# Patient Record
Sex: Male | Born: 1940 | Race: White | Hispanic: No | Marital: Married | Smoking: Never smoker
Health system: Southern US, Community
[De-identification: ages and names within clinical notes are randomized; demographics above are authoritative.]

## PROBLEM LIST (undated history)

## (undated) DIAGNOSIS — I447 Left bundle-branch block, unspecified: Secondary | ICD-10-CM

## (undated) DIAGNOSIS — I509 Heart failure, unspecified: Secondary | ICD-10-CM

## (undated) DIAGNOSIS — I219 Acute myocardial infarction, unspecified: Secondary | ICD-10-CM

## (undated) DIAGNOSIS — Z95810 Presence of automatic (implantable) cardiac defibrillator: Secondary | ICD-10-CM

## (undated) DIAGNOSIS — I472 Ventricular tachycardia, unspecified: Secondary | ICD-10-CM

## (undated) DIAGNOSIS — I2510 Atherosclerotic heart disease of native coronary artery without angina pectoris: Secondary | ICD-10-CM

## (undated) DIAGNOSIS — I259 Chronic ischemic heart disease, unspecified: Secondary | ICD-10-CM

## (undated) DIAGNOSIS — I209 Angina pectoris, unspecified: Secondary | ICD-10-CM

## (undated) DIAGNOSIS — E785 Hyperlipidemia, unspecified: Secondary | ICD-10-CM

## (undated) DIAGNOSIS — I10 Essential (primary) hypertension: Secondary | ICD-10-CM

## (undated) DIAGNOSIS — R0600 Dyspnea, unspecified: Secondary | ICD-10-CM

## (undated) DIAGNOSIS — E119 Type 2 diabetes mellitus without complications: Secondary | ICD-10-CM

## (undated) DIAGNOSIS — Z950 Presence of cardiac pacemaker: Secondary | ICD-10-CM

## (undated) DIAGNOSIS — I5043 Acute on chronic combined systolic (congestive) and diastolic (congestive) heart failure: Secondary | ICD-10-CM

## (undated) HISTORY — DX: Heart failure, unspecified: I50.9

## (undated) HISTORY — DX: Type 2 diabetes mellitus without complications: E11.9

## (undated) HISTORY — DX: Acute myocardial infarction, unspecified: I21.9

## (undated) HISTORY — DX: Hyperlipidemia, unspecified: E78.5

## (undated) HISTORY — PX: VASECTOMY: SHX75

## (undated) HISTORY — PX: CARDIAC CATHETERIZATION: SHX172

## (undated) HISTORY — PX: A-V CARDIAC PACEMAKER INSERTION: SHX562

## (undated) HISTORY — DX: Ventricular tachycardia, unspecified: I47.20

## (undated) HISTORY — DX: Left bundle-branch block, unspecified: I44.7

## (undated) HISTORY — DX: Ventricular tachycardia: I47.2

## (undated) HISTORY — DX: Chronic ischemic heart disease, unspecified: I25.9

## (undated) HISTORY — PX: URACHAL CYST EXCISION: SUR579

---
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
## 1998-12-08 HISTORY — PX: CORONARY ARTERY BYPASS GRAFT: SHX141

## 1999-09-07 ENCOUNTER — Inpatient Hospital Stay (HOSPITAL_COMMUNITY): Admission: EM | Admit: 1999-09-07 | Discharge: 1999-09-23 | Payer: Self-pay | Admitting: Emergency Medicine

## 1999-09-08 ENCOUNTER — Encounter: Payer: Self-pay | Admitting: Cardiothoracic Surgery

## 1999-09-09 ENCOUNTER — Encounter: Payer: Self-pay | Admitting: Cardiothoracic Surgery

## 1999-09-10 ENCOUNTER — Encounter: Payer: Self-pay | Admitting: Cardiothoracic Surgery

## 1999-09-11 ENCOUNTER — Encounter: Payer: Self-pay | Admitting: Cardiothoracic Surgery

## 1999-09-12 ENCOUNTER — Encounter: Payer: Self-pay | Admitting: Cardiothoracic Surgery

## 1999-09-13 ENCOUNTER — Encounter: Payer: Self-pay | Admitting: Cardiothoracic Surgery

## 1999-09-16 ENCOUNTER — Encounter: Payer: Self-pay | Admitting: Cardiothoracic Surgery

## 1999-09-17 ENCOUNTER — Encounter: Payer: Self-pay | Admitting: Cardiothoracic Surgery

## 1999-09-20 ENCOUNTER — Encounter: Payer: Self-pay | Admitting: Cardiothoracic Surgery

## 1999-09-22 ENCOUNTER — Encounter: Payer: Self-pay | Admitting: Thoracic Surgery (Cardiothoracic Vascular Surgery)

## 1999-10-08 ENCOUNTER — Encounter (HOSPITAL_COMMUNITY): Admission: RE | Admit: 1999-10-08 | Discharge: 2000-01-06 | Payer: Self-pay | Admitting: Cardiology

## 2000-06-02 ENCOUNTER — Inpatient Hospital Stay (HOSPITAL_COMMUNITY): Admission: EM | Admit: 2000-06-02 | Discharge: 2000-06-06 | Payer: Self-pay | Admitting: Emergency Medicine

## 2000-06-02 ENCOUNTER — Encounter: Payer: Self-pay | Admitting: Emergency Medicine

## 2000-08-11 ENCOUNTER — Encounter (HOSPITAL_COMMUNITY): Admission: RE | Admit: 2000-08-11 | Discharge: 2000-11-09 | Payer: Self-pay | Admitting: Cardiology

## 2000-09-07 ENCOUNTER — Inpatient Hospital Stay (HOSPITAL_COMMUNITY): Admission: EM | Admit: 2000-09-07 | Discharge: 2000-09-09 | Payer: Self-pay | Admitting: Emergency Medicine

## 2001-04-14 ENCOUNTER — Encounter: Payer: Self-pay | Admitting: Cardiology

## 2001-04-15 ENCOUNTER — Inpatient Hospital Stay (HOSPITAL_COMMUNITY): Admission: EM | Admit: 2001-04-15 | Discharge: 2001-04-20 | Payer: Self-pay | Admitting: Emergency Medicine

## 2001-04-20 ENCOUNTER — Encounter: Payer: Self-pay | Admitting: Internal Medicine

## 2003-11-17 ENCOUNTER — Ambulatory Visit: Admission: RE | Admit: 2003-11-17 | Discharge: 2003-11-17 | Payer: Self-pay | Admitting: Cardiology

## 2004-02-16 ENCOUNTER — Ambulatory Visit: Admission: RE | Admit: 2004-02-16 | Discharge: 2004-02-16 | Payer: Self-pay | Admitting: Pulmonary Disease

## 2004-05-13 ENCOUNTER — Encounter: Admission: RE | Admit: 2004-05-13 | Discharge: 2004-05-13 | Payer: Self-pay | Admitting: Internal Medicine

## 2004-08-09 ENCOUNTER — Ambulatory Visit (HOSPITAL_COMMUNITY): Admission: RE | Admit: 2004-08-09 | Discharge: 2004-08-09 | Payer: Self-pay | Admitting: Cardiology

## 2004-08-14 ENCOUNTER — Inpatient Hospital Stay (HOSPITAL_COMMUNITY): Admission: RE | Admit: 2004-08-14 | Discharge: 2004-08-14 | Payer: Self-pay | Admitting: Internal Medicine

## 2004-09-09 ENCOUNTER — Ambulatory Visit: Payer: Self-pay | Admitting: Cardiology

## 2004-10-22 ENCOUNTER — Ambulatory Visit: Payer: Self-pay | Admitting: Internal Medicine

## 2004-11-04 ENCOUNTER — Ambulatory Visit: Payer: Self-pay | Admitting: Internal Medicine

## 2004-11-20 ENCOUNTER — Ambulatory Visit: Payer: Self-pay

## 2004-11-20 ENCOUNTER — Ambulatory Visit: Payer: Self-pay | Admitting: Internal Medicine

## 2004-11-22 ENCOUNTER — Ambulatory Visit: Payer: Self-pay | Admitting: Critical Care Medicine

## 2004-11-22 ENCOUNTER — Ambulatory Visit: Admission: RE | Admit: 2004-11-22 | Discharge: 2004-11-22 | Payer: Self-pay | Admitting: Cardiology

## 2005-02-10 ENCOUNTER — Ambulatory Visit: Payer: Self-pay | Admitting: Internal Medicine

## 2005-02-14 ENCOUNTER — Ambulatory Visit: Payer: Self-pay | Admitting: Internal Medicine

## 2005-02-20 ENCOUNTER — Ambulatory Visit: Payer: Self-pay

## 2005-05-02 ENCOUNTER — Ambulatory Visit: Payer: Self-pay | Admitting: Cardiology

## 2005-05-09 ENCOUNTER — Ambulatory Visit: Payer: Self-pay | Admitting: Cardiology

## 2005-05-12 ENCOUNTER — Ambulatory Visit: Payer: Self-pay | Admitting: Cardiovascular Disease

## 2005-05-12 ENCOUNTER — Inpatient Hospital Stay (HOSPITAL_BASED_OUTPATIENT_CLINIC_OR_DEPARTMENT_OTHER): Admission: RE | Admit: 2005-05-12 | Discharge: 2005-05-12 | Payer: Self-pay | Admitting: Cardiovascular Disease

## 2005-05-29 ENCOUNTER — Ambulatory Visit: Payer: Self-pay | Admitting: Cardiology

## 2005-08-01 ENCOUNTER — Ambulatory Visit: Payer: Self-pay | Admitting: Internal Medicine

## 2005-09-12 ENCOUNTER — Ambulatory Visit: Payer: Self-pay | Admitting: Internal Medicine

## 2005-11-07 ENCOUNTER — Ambulatory Visit: Payer: Self-pay | Admitting: Cardiology

## 2005-11-17 ENCOUNTER — Ambulatory Visit: Payer: Self-pay | Admitting: Pulmonary Disease

## 2005-11-18 ENCOUNTER — Ambulatory Visit: Admission: RE | Admit: 2005-11-18 | Discharge: 2005-11-18 | Payer: Self-pay | Admitting: Pulmonary Disease

## 2005-12-17 ENCOUNTER — Inpatient Hospital Stay (HOSPITAL_COMMUNITY): Admission: EM | Admit: 2005-12-17 | Discharge: 2005-12-18 | Payer: Self-pay | Admitting: Emergency Medicine

## 2005-12-17 ENCOUNTER — Ambulatory Visit: Payer: Self-pay | Admitting: Cardiovascular Disease

## 2005-12-22 ENCOUNTER — Ambulatory Visit: Payer: Self-pay | Admitting: Cardiology

## 2006-01-02 ENCOUNTER — Ambulatory Visit: Payer: Self-pay | Admitting: Cardiology

## 2006-01-06 ENCOUNTER — Ambulatory Visit: Payer: Self-pay | Admitting: Cardiology

## 2006-01-14 ENCOUNTER — Ambulatory Visit: Payer: Self-pay | Admitting: Cardiology

## 2006-01-22 ENCOUNTER — Ambulatory Visit: Payer: Self-pay | Admitting: Cardiology

## 2006-02-05 ENCOUNTER — Ambulatory Visit: Payer: Self-pay | Admitting: Cardiology

## 2006-02-26 ENCOUNTER — Ambulatory Visit: Payer: Self-pay | Admitting: Cardiology

## 2006-03-17 ENCOUNTER — Ambulatory Visit: Payer: Self-pay | Admitting: Cardiology

## 2006-04-08 ENCOUNTER — Ambulatory Visit: Payer: Self-pay | Admitting: Cardiology

## 2006-04-13 ENCOUNTER — Ambulatory Visit: Payer: Self-pay | Admitting: Internal Medicine

## 2006-04-14 ENCOUNTER — Ambulatory Visit: Payer: Self-pay | Admitting: Cardiology

## 2006-04-15 ENCOUNTER — Ambulatory Visit: Payer: Self-pay | Admitting: Internal Medicine

## 2006-04-30 ENCOUNTER — Ambulatory Visit: Payer: Self-pay | Admitting: Internal Medicine

## 2006-04-30 ENCOUNTER — Ambulatory Visit: Payer: Self-pay

## 2006-05-12 ENCOUNTER — Ambulatory Visit: Payer: Self-pay | Admitting: Internal Medicine

## 2006-05-13 ENCOUNTER — Ambulatory Visit: Payer: Self-pay | Admitting: Cardiology

## 2006-06-04 ENCOUNTER — Ambulatory Visit: Payer: Self-pay | Admitting: Cardiology

## 2006-07-06 ENCOUNTER — Ambulatory Visit: Payer: Self-pay | Admitting: Cardiology

## 2006-07-21 ENCOUNTER — Ambulatory Visit: Payer: Self-pay | Admitting: Cardiology

## 2006-08-06 ENCOUNTER — Ambulatory Visit: Payer: Self-pay | Admitting: Internal Medicine

## 2006-08-18 ENCOUNTER — Ambulatory Visit: Payer: Self-pay | Admitting: Cardiology

## 2006-08-19 ENCOUNTER — Ambulatory Visit: Payer: Self-pay | Admitting: Internal Medicine

## 2006-09-15 ENCOUNTER — Ambulatory Visit: Payer: Self-pay | Admitting: Cardiology

## 2006-10-13 ENCOUNTER — Ambulatory Visit: Payer: Self-pay | Admitting: Cardiology

## 2006-10-21 ENCOUNTER — Ambulatory Visit: Payer: Self-pay | Admitting: Cardiology

## 2006-11-10 ENCOUNTER — Ambulatory Visit: Payer: Self-pay | Admitting: Internal Medicine

## 2006-11-24 ENCOUNTER — Ambulatory Visit: Payer: Self-pay | Admitting: Cardiology

## 2006-12-04 ENCOUNTER — Ambulatory Visit: Payer: Self-pay | Admitting: Cardiology

## 2006-12-31 ENCOUNTER — Ambulatory Visit: Payer: Self-pay | Admitting: Internal Medicine

## 2007-01-01 ENCOUNTER — Ambulatory Visit: Payer: Self-pay | Admitting: Internal Medicine

## 2007-01-29 ENCOUNTER — Ambulatory Visit: Payer: Self-pay | Admitting: Internal Medicine

## 2007-02-25 ENCOUNTER — Ambulatory Visit: Payer: Self-pay | Admitting: Cardiology

## 2007-03-03 ENCOUNTER — Ambulatory Visit: Payer: Self-pay | Admitting: Internal Medicine

## 2007-03-03 LAB — CONVERTED CEMR LAB
ALT: 35 units/L (ref 0–40)
AST: 29 units/L (ref 0–37)
Cholesterol: 126 mg/dL (ref 0–200)
Total Bilirubin: 0.9 mg/dL (ref 0.3–1.2)
Total Protein: 6.7 g/dL (ref 6.0–8.3)
VLDL: 55 mg/dL — ABNORMAL HIGH (ref 0–40)

## 2007-03-11 ENCOUNTER — Ambulatory Visit: Payer: Self-pay | Admitting: Internal Medicine

## 2007-03-11 LAB — CONVERTED CEMR LAB
Creatinine, Ser: 1.2 mg/dL (ref 0.4–1.5)
Eosinophils Absolute: 0.3 10*3/uL (ref 0.0–0.6)
HCT: 42.6 % (ref 39.0–52.0)
Hemoglobin: 14.9 g/dL (ref 13.0–17.0)
Hgb A1c MFr Bld: 6.4 % — ABNORMAL HIGH (ref 4.6–6.0)
Lymphocytes Relative: 22.2 % (ref 12.0–46.0)
MCV: 86.8 fL (ref 78.0–100.0)
Monocytes Relative: 7.7 % (ref 3.0–11.0)
Neutro Abs: 6.8 10*3/uL (ref 1.4–7.7)
Platelets: 206 10*3/uL (ref 150–400)
RBC: 4.91 M/uL (ref 4.22–5.81)
RDW: 12.7 % (ref 11.5–14.6)
WBC: 10.3 10*3/uL (ref 4.5–10.5)

## 2007-03-26 DIAGNOSIS — M109 Gout, unspecified: Secondary | ICD-10-CM

## 2007-03-26 DIAGNOSIS — E8881 Metabolic syndrome: Secondary | ICD-10-CM

## 2007-04-01 ENCOUNTER — Ambulatory Visit: Payer: Self-pay | Admitting: Internal Medicine

## 2007-04-02 ENCOUNTER — Ambulatory Visit: Payer: Self-pay | Admitting: Cardiology

## 2007-04-30 ENCOUNTER — Ambulatory Visit: Payer: Self-pay | Admitting: Cardiovascular Disease

## 2007-05-28 ENCOUNTER — Ambulatory Visit: Payer: Self-pay | Admitting: Cardiology

## 2007-06-05 ENCOUNTER — Encounter: Payer: Self-pay | Admitting: Internal Medicine

## 2007-06-25 ENCOUNTER — Ambulatory Visit: Payer: Self-pay | Admitting: Cardiovascular Disease

## 2007-07-21 ENCOUNTER — Ambulatory Visit: Payer: Self-pay | Admitting: Cardiology

## 2007-08-06 ENCOUNTER — Encounter: Payer: Self-pay | Admitting: Internal Medicine

## 2007-08-18 ENCOUNTER — Ambulatory Visit: Payer: Self-pay | Admitting: Cardiology

## 2007-08-25 ENCOUNTER — Ambulatory Visit: Payer: Self-pay | Admitting: Internal Medicine

## 2007-08-26 ENCOUNTER — Ambulatory Visit: Payer: Self-pay | Admitting: Internal Medicine

## 2007-09-07 ENCOUNTER — Ambulatory Visit: Payer: Self-pay | Admitting: Cardiology

## 2007-09-20 ENCOUNTER — Observation Stay (HOSPITAL_COMMUNITY): Admission: EM | Admit: 2007-09-20 | Discharge: 2007-09-21 | Payer: Self-pay | Admitting: Emergency Medicine

## 2007-09-20 ENCOUNTER — Ambulatory Visit: Payer: Self-pay | Admitting: Cardiology

## 2007-09-22 ENCOUNTER — Encounter: Payer: Self-pay | Admitting: Internal Medicine

## 2007-09-22 ENCOUNTER — Ambulatory Visit: Payer: Self-pay

## 2007-10-01 ENCOUNTER — Telehealth (INDEPENDENT_AMBULATORY_CARE_PROVIDER_SITE_OTHER): Payer: Self-pay | Admitting: *Deleted

## 2007-10-04 ENCOUNTER — Telehealth (INDEPENDENT_AMBULATORY_CARE_PROVIDER_SITE_OTHER): Payer: Self-pay | Admitting: *Deleted

## 2007-10-05 ENCOUNTER — Ambulatory Visit: Payer: Self-pay | Admitting: Cardiology

## 2007-10-18 ENCOUNTER — Ambulatory Visit: Payer: Self-pay | Admitting: Internal Medicine

## 2007-10-18 DIAGNOSIS — E119 Type 2 diabetes mellitus without complications: Secondary | ICD-10-CM

## 2007-10-18 DIAGNOSIS — I10 Essential (primary) hypertension: Secondary | ICD-10-CM | POA: Insufficient documentation

## 2007-10-18 LAB — CONVERTED CEMR LAB: HDL goal, serum: 40 mg/dL

## 2007-10-19 ENCOUNTER — Ambulatory Visit: Payer: Self-pay | Admitting: Cardiovascular Disease

## 2007-10-19 ENCOUNTER — Ambulatory Visit: Payer: Self-pay | Admitting: Cardiology

## 2007-10-27 ENCOUNTER — Encounter (INDEPENDENT_AMBULATORY_CARE_PROVIDER_SITE_OTHER): Payer: Self-pay | Admitting: *Deleted

## 2007-10-27 LAB — CONVERTED CEMR LAB
Microalb Creat Ratio: 11.9 mg/g (ref 0.0–30.0)
Uric Acid, Serum: 9.9 mg/dL — ABNORMAL HIGH (ref 2.4–7.0)

## 2007-11-09 ENCOUNTER — Ambulatory Visit: Payer: Self-pay | Admitting: Cardiology

## 2007-11-25 ENCOUNTER — Ambulatory Visit: Payer: Self-pay | Admitting: Internal Medicine

## 2007-11-30 ENCOUNTER — Ambulatory Visit: Payer: Self-pay | Admitting: Cardiology

## 2007-12-28 ENCOUNTER — Ambulatory Visit: Payer: Self-pay | Admitting: Cardiology

## 2008-01-04 ENCOUNTER — Encounter: Payer: Self-pay | Admitting: Internal Medicine

## 2008-01-25 ENCOUNTER — Ambulatory Visit: Payer: Self-pay | Admitting: Cardiovascular Disease

## 2008-02-11 ENCOUNTER — Ambulatory Visit: Payer: Self-pay | Admitting: Internal Medicine

## 2008-02-11 DIAGNOSIS — I951 Orthostatic hypotension: Secondary | ICD-10-CM | POA: Insufficient documentation

## 2008-02-11 DIAGNOSIS — E782 Mixed hyperlipidemia: Secondary | ICD-10-CM | POA: Insufficient documentation

## 2008-02-22 ENCOUNTER — Ambulatory Visit: Payer: Self-pay | Admitting: Cardiology

## 2008-02-24 ENCOUNTER — Ambulatory Visit: Payer: Self-pay | Admitting: Internal Medicine

## 2008-03-01 ENCOUNTER — Telehealth (INDEPENDENT_AMBULATORY_CARE_PROVIDER_SITE_OTHER): Payer: Self-pay | Admitting: *Deleted

## 2008-03-02 ENCOUNTER — Ambulatory Visit: Payer: Self-pay | Admitting: Internal Medicine

## 2008-03-02 LAB — CONVERTED CEMR LAB: INR: 1.6

## 2008-03-03 ENCOUNTER — Ambulatory Visit: Payer: Self-pay | Admitting: Internal Medicine

## 2008-03-06 ENCOUNTER — Encounter (INDEPENDENT_AMBULATORY_CARE_PROVIDER_SITE_OTHER): Payer: Self-pay | Admitting: *Deleted

## 2008-03-06 ENCOUNTER — Ambulatory Visit: Payer: Self-pay | Admitting: Cardiology

## 2008-03-22 ENCOUNTER — Ambulatory Visit: Payer: Self-pay | Admitting: Internal Medicine

## 2008-04-05 ENCOUNTER — Ambulatory Visit: Payer: Self-pay | Admitting: Cardiovascular Disease

## 2008-04-19 ENCOUNTER — Ambulatory Visit: Payer: Self-pay | Admitting: Internal Medicine

## 2008-04-19 LAB — CONVERTED CEMR LAB
Direct LDL: 61.6 mg/dL
HDL: 25.9 mg/dL — ABNORMAL LOW (ref >39.0)
Total CHOL/HDL Ratio: 4.9
Triglycerides: 285 mg/dL (ref 0–149)
VLDL: 57 mg/dL — ABNORMAL HIGH (ref 0–40)

## 2008-04-25 ENCOUNTER — Encounter: Payer: Self-pay | Admitting: Internal Medicine

## 2008-04-26 ENCOUNTER — Ambulatory Visit: Payer: Self-pay | Admitting: Cardiovascular Disease

## 2008-04-27 ENCOUNTER — Encounter (INDEPENDENT_AMBULATORY_CARE_PROVIDER_SITE_OTHER): Payer: Self-pay | Admitting: *Deleted

## 2008-05-04 ENCOUNTER — Ambulatory Visit: Payer: Self-pay | Admitting: Internal Medicine

## 2008-05-24 ENCOUNTER — Ambulatory Visit: Payer: Self-pay | Admitting: Cardiology

## 2008-05-25 ENCOUNTER — Ambulatory Visit: Payer: Self-pay | Admitting: Internal Medicine

## 2008-06-21 ENCOUNTER — Ambulatory Visit: Payer: Self-pay | Admitting: Cardiovascular Disease

## 2008-07-03 ENCOUNTER — Ambulatory Visit: Payer: Self-pay | Admitting: Cardiology

## 2008-07-03 LAB — CONVERTED CEMR LAB
Albumin: 4.1 g/dL (ref 3.5–5.2)
Alkaline Phosphatase: 29 units/L — ABNORMAL LOW (ref 39–117)
BUN: 27 mg/dL — ABNORMAL HIGH (ref 6–23)
CO2: 29 meq/L (ref 19–32)
Calcium: 8.9 mg/dL (ref 8.4–10.5)
Creatinine, Ser: 1.4 mg/dL (ref 0.4–1.5)
Direct LDL: 59.6 mg/dL
GFR calc non Af Amer: 54 mL/min
HCT: 37.4 % — ABNORMAL LOW (ref 39.0–52.0)
HDL: 29.4 mg/dL — ABNORMAL LOW (ref >39.0)
Hemoglobin: 13.4 g/dL (ref 13.0–17.0)
Lymphocytes Relative: 22.5 % (ref 12.0–46.0)
MCV: 88.8 fL (ref 78.0–100.0)
Neutro Abs: 5.5 10*3/uL (ref 1.4–7.7)
Platelets: 167 10*3/uL (ref 150–400)
RBC: 4.21 M/uL — ABNORMAL LOW (ref 4.22–5.81)
Total Bilirubin: 0.7 mg/dL (ref 0.3–1.2)
Total Protein: 6.8 g/dL (ref 6.0–8.3)
WBC: 8.2 10*3/uL (ref 4.5–10.5)

## 2008-07-04 ENCOUNTER — Encounter: Payer: Self-pay | Admitting: Internal Medicine

## 2008-07-05 ENCOUNTER — Ambulatory Visit: Payer: Self-pay | Admitting: Cardiology

## 2008-07-19 ENCOUNTER — Ambulatory Visit: Payer: Self-pay | Admitting: Cardiovascular Disease

## 2008-08-01 ENCOUNTER — Ambulatory Visit: Payer: Self-pay | Admitting: Internal Medicine

## 2008-08-04 ENCOUNTER — Ambulatory Visit: Payer: Self-pay | Admitting: Internal Medicine

## 2008-08-04 DIAGNOSIS — E790 Hyperuricemia without signs of inflammatory arthritis and tophaceous disease: Secondary | ICD-10-CM | POA: Insufficient documentation

## 2008-08-04 LAB — CONVERTED CEMR LAB
BUN: 22 mg/dL (ref 6–23)
Total CHOL/HDL Ratio: 8
Uric Acid, Serum: 9.8 mg/dL — ABNORMAL HIGH (ref 4.0–7.8)

## 2008-08-09 ENCOUNTER — Ambulatory Visit: Payer: Self-pay | Admitting: Cardiology

## 2008-08-15 ENCOUNTER — Ambulatory Visit: Payer: Self-pay | Admitting: Internal Medicine

## 2008-09-06 ENCOUNTER — Ambulatory Visit: Payer: Self-pay | Admitting: Cardiovascular Disease

## 2008-10-04 ENCOUNTER — Ambulatory Visit: Payer: Self-pay | Admitting: Cardiovascular Disease

## 2008-10-25 ENCOUNTER — Ambulatory Visit: Payer: Self-pay | Admitting: Cardiology

## 2008-11-22 ENCOUNTER — Ambulatory Visit: Payer: Self-pay | Admitting: Cardiovascular Disease

## 2008-12-06 ENCOUNTER — Ambulatory Visit: Payer: Self-pay | Admitting: Cardiology

## 2009-01-02 ENCOUNTER — Ambulatory Visit: Payer: Self-pay | Admitting: Cardiology

## 2009-01-02 ENCOUNTER — Ambulatory Visit: Payer: Self-pay | Admitting: Internal Medicine

## 2009-01-04 ENCOUNTER — Encounter: Payer: Self-pay | Admitting: Internal Medicine

## 2009-01-23 ENCOUNTER — Ambulatory Visit: Payer: Self-pay | Admitting: Cardiology

## 2009-02-20 ENCOUNTER — Ambulatory Visit: Payer: Self-pay | Admitting: Cardiology

## 2009-03-13 ENCOUNTER — Ambulatory Visit: Payer: Self-pay | Admitting: Cardiovascular Disease

## 2009-04-03 ENCOUNTER — Ambulatory Visit: Payer: Self-pay | Admitting: Cardiology

## 2009-04-05 ENCOUNTER — Ambulatory Visit: Payer: Self-pay | Admitting: Internal Medicine

## 2009-04-12 ENCOUNTER — Encounter: Payer: Self-pay | Admitting: Internal Medicine

## 2009-04-24 ENCOUNTER — Ambulatory Visit: Payer: Self-pay | Admitting: Cardiovascular Disease

## 2009-04-25 ENCOUNTER — Telehealth (INDEPENDENT_AMBULATORY_CARE_PROVIDER_SITE_OTHER): Payer: Self-pay | Admitting: *Deleted

## 2009-04-30 ENCOUNTER — Ambulatory Visit: Payer: Self-pay | Admitting: Internal Medicine

## 2009-05-09 ENCOUNTER — Encounter: Payer: Self-pay | Admitting: *Deleted

## 2009-05-22 ENCOUNTER — Ambulatory Visit: Payer: Self-pay | Admitting: Internal Medicine

## 2009-05-22 LAB — CONVERTED CEMR LAB
POC INR: 2
Protime: 17.6

## 2009-06-07 ENCOUNTER — Ambulatory Visit: Payer: Self-pay | Admitting: Cardiology

## 2009-06-08 LAB — CONVERTED CEMR LAB
AST: 28 units/L (ref 0–37)
Albumin: 4.1 g/dL (ref 3.5–5.2)
Alkaline Phosphatase: 48 units/L (ref 39–117)
Basophils Absolute: 0.1 10*3/uL (ref 0.0–0.1)
Bilirubin, Direct: 0.1 mg/dL (ref 0.0–0.3)
Eosinophils Relative: 3.6 % (ref 0.0–5.0)
GFR calc non Af Amer: 63.73 mL/min (ref >60)
HDL: 39.9 mg/dL (ref >39.00)
Hemoglobin: 15.2 g/dL (ref 13.0–17.0)
LDL Cholesterol: 75 mg/dL (ref 0–99)
Lymphocytes Relative: 24.5 % (ref 12.0–46.0)
MCV: 91.6 fL (ref 78.0–100.0)
Neutrophils Relative %: 64.9 % (ref 43.0–77.0)
Potassium: 4.1 meq/L (ref 3.5–5.1)
RDW: 12.2 % (ref 11.5–14.6)
Total Bilirubin: 0.8 mg/dL (ref 0.3–1.2)
Total CHOL/HDL Ratio: 4
Total Protein: 8 g/dL (ref 6.0–8.3)
Uric Acid, Serum: 8.9 mg/dL — ABNORMAL HIGH (ref 4.0–7.8)

## 2009-06-12 ENCOUNTER — Encounter: Payer: Self-pay | Admitting: Internal Medicine

## 2009-06-12 ENCOUNTER — Ambulatory Visit: Payer: Self-pay | Admitting: Cardiology

## 2009-06-12 DIAGNOSIS — I255 Ischemic cardiomyopathy: Secondary | ICD-10-CM

## 2009-06-13 ENCOUNTER — Encounter: Payer: Self-pay | Admitting: *Deleted

## 2009-06-19 ENCOUNTER — Encounter (INDEPENDENT_AMBULATORY_CARE_PROVIDER_SITE_OTHER): Payer: Self-pay | Admitting: Cardiology

## 2009-06-19 ENCOUNTER — Ambulatory Visit: Payer: Self-pay | Admitting: Cardiovascular Disease

## 2009-06-19 LAB — CONVERTED CEMR LAB
POC INR: 2.3
Prothrombin Time: 18.5 s

## 2009-06-25 ENCOUNTER — Telehealth (INDEPENDENT_AMBULATORY_CARE_PROVIDER_SITE_OTHER): Payer: Self-pay | Admitting: *Deleted

## 2009-07-05 ENCOUNTER — Ambulatory Visit: Payer: Self-pay | Admitting: Internal Medicine

## 2009-07-16 ENCOUNTER — Telehealth (INDEPENDENT_AMBULATORY_CARE_PROVIDER_SITE_OTHER): Payer: Self-pay | Admitting: *Deleted

## 2009-07-17 ENCOUNTER — Ambulatory Visit: Payer: Self-pay | Admitting: Internal Medicine

## 2009-08-15 ENCOUNTER — Ambulatory Visit: Payer: Self-pay | Admitting: Internal Medicine

## 2009-09-06 ENCOUNTER — Encounter: Payer: Self-pay | Admitting: Internal Medicine

## 2009-09-12 ENCOUNTER — Ambulatory Visit: Payer: Self-pay | Admitting: Internal Medicine

## 2009-09-12 ENCOUNTER — Ambulatory Visit: Payer: Self-pay | Admitting: Cardiology

## 2009-09-12 LAB — CONVERTED CEMR LAB
BUN: 17 mg/dL (ref 6–23)
Basophils Absolute: 0.1 10*3/uL (ref 0.0–0.1)
Basophils Relative: 0.7 % (ref 0.0–3.0)
CO2: 30 meq/L (ref 19–32)
Creatinine, Ser: 1.3 mg/dL (ref 0.4–1.5)
Eosinophils Relative: 3.1 % (ref 0.0–5.0)
Glucose, Bld: 130 mg/dL — ABNORMAL HIGH (ref 70–99)
HCT: 42.2 % (ref 39.0–52.0)
INR: 3 — ABNORMAL HIGH (ref 0.8–1.0)
Lymphocytes Relative: 27.5 % (ref 12.0–46.0)
Lymphs Abs: 2.5 10*3/uL (ref 0.7–4.0)
MCHC: 34.4 g/dL (ref 30.0–36.0)
MCV: 91.8 fL (ref 78.0–100.0)
Monocytes Absolute: 0.7 10*3/uL (ref 0.1–1.0)
Neutro Abs: 5.5 10*3/uL (ref 1.4–7.7)
Neutrophils Relative %: 61.3 % (ref 43.0–77.0)
POC INR: 3.1
Potassium: 4.4 meq/L (ref 3.5–5.1)
Prothrombin Time: 30.5 s — ABNORMAL HIGH (ref 9.1–11.7)
Sodium: 138 meq/L (ref 135–145)

## 2009-09-13 ENCOUNTER — Encounter: Payer: Self-pay | Admitting: Internal Medicine

## 2009-09-18 ENCOUNTER — Ambulatory Visit: Payer: Self-pay | Admitting: Cardiology

## 2009-09-18 LAB — CONVERTED CEMR LAB: POC INR: 1.3

## 2009-09-19 ENCOUNTER — Ambulatory Visit (HOSPITAL_COMMUNITY): Admission: RE | Admit: 2009-09-19 | Discharge: 2009-09-19 | Payer: Self-pay | Admitting: Internal Medicine

## 2009-09-19 ENCOUNTER — Ambulatory Visit: Payer: Self-pay | Admitting: Internal Medicine

## 2009-09-24 ENCOUNTER — Encounter: Payer: Self-pay | Admitting: Internal Medicine

## 2009-10-03 ENCOUNTER — Encounter: Payer: Self-pay | Admitting: Internal Medicine

## 2009-10-03 ENCOUNTER — Ambulatory Visit: Payer: Self-pay

## 2009-10-05 ENCOUNTER — Ambulatory Visit: Payer: Self-pay | Admitting: Internal Medicine

## 2009-10-16 ENCOUNTER — Ambulatory Visit: Payer: Self-pay | Admitting: Cardiology

## 2009-11-13 ENCOUNTER — Ambulatory Visit: Payer: Self-pay | Admitting: Internal Medicine

## 2009-12-11 ENCOUNTER — Ambulatory Visit: Payer: Self-pay | Admitting: Cardiovascular Disease

## 2010-01-01 ENCOUNTER — Ambulatory Visit: Payer: Self-pay | Admitting: Internal Medicine

## 2010-01-08 ENCOUNTER — Ambulatory Visit: Payer: Self-pay | Admitting: Internal Medicine

## 2010-02-05 ENCOUNTER — Ambulatory Visit: Payer: Self-pay | Admitting: Internal Medicine

## 2010-02-05 ENCOUNTER — Encounter (INDEPENDENT_AMBULATORY_CARE_PROVIDER_SITE_OTHER): Payer: Self-pay | Admitting: Cardiology

## 2010-02-05 LAB — CONVERTED CEMR LAB: POC INR: 2.5

## 2010-03-05 ENCOUNTER — Ambulatory Visit: Payer: Self-pay | Admitting: Cardiology

## 2010-03-05 LAB — CONVERTED CEMR LAB: POC INR: 2.7

## 2010-03-31 ENCOUNTER — Encounter: Payer: Self-pay | Admitting: Internal Medicine

## 2010-04-01 ENCOUNTER — Ambulatory Visit: Payer: Self-pay | Admitting: Internal Medicine

## 2010-04-02 ENCOUNTER — Ambulatory Visit: Payer: Self-pay | Admitting: Cardiology

## 2010-04-05 ENCOUNTER — Encounter: Payer: Self-pay | Admitting: Internal Medicine

## 2010-04-18 ENCOUNTER — Encounter (INDEPENDENT_AMBULATORY_CARE_PROVIDER_SITE_OTHER): Payer: Self-pay | Admitting: *Deleted

## 2010-04-30 ENCOUNTER — Ambulatory Visit: Payer: Self-pay | Admitting: Cardiology

## 2010-04-30 LAB — CONVERTED CEMR LAB: POC INR: 1.9

## 2010-05-21 ENCOUNTER — Ambulatory Visit: Payer: Self-pay | Admitting: Internal Medicine

## 2010-05-21 DIAGNOSIS — D489 Neoplasm of uncertain behavior, unspecified: Secondary | ICD-10-CM | POA: Insufficient documentation

## 2010-05-21 DIAGNOSIS — Z8639 Personal history of other endocrine, nutritional and metabolic disease: Secondary | ICD-10-CM

## 2010-05-21 DIAGNOSIS — Z862 Personal history of diseases of the blood and blood-forming organs and certain disorders involving the immune mechanism: Secondary | ICD-10-CM | POA: Insufficient documentation

## 2010-05-28 ENCOUNTER — Ambulatory Visit: Payer: Self-pay | Admitting: Internal Medicine

## 2010-05-29 ENCOUNTER — Telehealth (INDEPENDENT_AMBULATORY_CARE_PROVIDER_SITE_OTHER): Payer: Self-pay | Admitting: *Deleted

## 2010-06-07 ENCOUNTER — Encounter: Payer: Self-pay | Admitting: Cardiology

## 2010-06-20 ENCOUNTER — Ambulatory Visit: Payer: Self-pay | Admitting: Cardiology

## 2010-06-20 ENCOUNTER — Telehealth (INDEPENDENT_AMBULATORY_CARE_PROVIDER_SITE_OTHER): Payer: Self-pay | Admitting: *Deleted

## 2010-06-20 DIAGNOSIS — I2510 Atherosclerotic heart disease of native coronary artery without angina pectoris: Secondary | ICD-10-CM

## 2010-06-26 ENCOUNTER — Telehealth (INDEPENDENT_AMBULATORY_CARE_PROVIDER_SITE_OTHER): Payer: Self-pay | Admitting: *Deleted

## 2010-06-27 ENCOUNTER — Encounter (HOSPITAL_COMMUNITY): Admission: RE | Admit: 2010-06-27 | Discharge: 2010-09-04 | Payer: Self-pay | Admitting: Cardiology

## 2010-06-27 ENCOUNTER — Ambulatory Visit: Payer: Self-pay

## 2010-06-27 ENCOUNTER — Ambulatory Visit: Payer: Self-pay | Admitting: Cardiology

## 2010-06-27 ENCOUNTER — Encounter: Payer: Self-pay | Admitting: Cardiology

## 2010-07-05 ENCOUNTER — Encounter: Payer: Self-pay | Admitting: Internal Medicine

## 2010-07-11 ENCOUNTER — Ambulatory Visit: Payer: Self-pay | Admitting: Internal Medicine

## 2010-07-18 ENCOUNTER — Ambulatory Visit: Payer: Self-pay | Admitting: Internal Medicine

## 2010-07-26 ENCOUNTER — Encounter: Payer: Self-pay | Admitting: Internal Medicine

## 2010-08-15 ENCOUNTER — Ambulatory Visit: Payer: Self-pay | Admitting: Cardiology

## 2010-09-19 ENCOUNTER — Ambulatory Visit: Payer: Self-pay | Admitting: Cardiology

## 2010-09-19 LAB — CONVERTED CEMR LAB: INR: 2

## 2010-10-01 ENCOUNTER — Ambulatory Visit: Payer: Self-pay | Admitting: Internal Medicine

## 2010-10-01 DIAGNOSIS — I5022 Chronic systolic (congestive) heart failure: Secondary | ICD-10-CM | POA: Insufficient documentation

## 2010-10-17 ENCOUNTER — Ambulatory Visit: Payer: Self-pay | Admitting: Cardiology

## 2010-11-14 ENCOUNTER — Ambulatory Visit: Payer: Self-pay | Admitting: Internal Medicine

## 2010-11-14 LAB — CONVERTED CEMR LAB: POC INR: 3

## 2010-12-12 ENCOUNTER — Ambulatory Visit: Admission: RE | Admit: 2010-12-12 | Discharge: 2010-12-12 | Payer: Self-pay | Source: Home / Self Care

## 2010-12-12 LAB — CONVERTED CEMR LAB: POC INR: 2.1

## 2011-01-02 ENCOUNTER — Ambulatory Visit
Admission: RE | Admit: 2011-01-02 | Discharge: 2011-01-02 | Payer: Self-pay | Source: Home / Self Care | Attending: Internal Medicine | Admitting: Internal Medicine

## 2011-01-02 ENCOUNTER — Encounter: Payer: Self-pay | Admitting: Internal Medicine

## 2011-01-05 LAB — CONVERTED CEMR LAB
Creatinine, Ser: 1.2 mg/dL (ref 0.4–1.5)
Microalb Creat Ratio: 0.6 mg/g (ref 0.0–30.0)
Uric Acid, Serum: 9.5 mg/dL — ABNORMAL HIGH (ref 4.0–7.8)

## 2011-01-09 ENCOUNTER — Encounter (INDEPENDENT_AMBULATORY_CARE_PROVIDER_SITE_OTHER): Payer: MEDICARE

## 2011-01-09 ENCOUNTER — Encounter: Payer: Self-pay | Admitting: Cardiology

## 2011-01-09 ENCOUNTER — Ambulatory Visit: Admit: 2011-01-09 | Payer: Self-pay

## 2011-01-09 DIAGNOSIS — I4891 Unspecified atrial fibrillation: Secondary | ICD-10-CM

## 2011-01-09 DIAGNOSIS — Z7901 Long term (current) use of anticoagulants: Secondary | ICD-10-CM

## 2011-01-09 LAB — CONVERTED CEMR LAB: POC INR: 2.3

## 2011-01-09 NOTE — Cardiovascular Report (Signed)
Summary: Office Visit Remote  Office Visit Remote   Imported By: Sallee Provencal 04/12/2010 14:19:30  _____________________________________________________________________  External Attachment:    Type:   Image     Comment:   External Document

## 2011-01-09 NOTE — Assessment & Plan Note (Signed)
Summary: Cardiology Nuclear Study  Nuclear Med Background Indications for Stress Test: Evaluation for Ischemia, Graft Patency   History: CABG, Defibrillator, Heart Catheterization, Myocardial Infarction, Myocardial Perfusion Study  History Comments: '00 MI>CABG x 4; 1/08 Cath:diffuse CAD, EF=15-20%; 10/08 [id number], apical and inferior wall infarct, no ischemia, EF=27%; 10/10 AICD; h/o ICM, PAF, V-tach  Symptoms: Dizziness, Light-Headedness    Nuclear Pre-Procedure Cardiac Risk Factors: Hypertension, IDDM Type 2, LBBB, Lipids Caffeine/Decaff Intake: None NPO After: 7:00 PM Lungs: clear IV 0.9% NS with Angio Cath: 20g     IV Site: (R) AC IV Started by: Irven Baltimore RN Chest Size (in) 40     Height (in): 66 Weight (lb): 187 BMI: 30.29  Nuclear Med Study 1 or 2 day study:  1 day     Stress Test Type:  Adenosine Reading MD:  Loralie Champagne, MD     Referring MD:  Kirk Ruths MD Resting Radionuclide:  Technetium 33m Tetrofosmin     Resting Radionuclide Dose:  11.0 mCi  Stress Radionuclide:  Technetium 75m Tetrofosmin     Stress Radionuclide Dose:  33.0 mCi   Stress Protocol  Dose of Adenosine:  47.6 mg    Stress Test Technologist:  Perrin Maltese EMT-P     Nuclear Technologist:  Vedia Pereyra CNMT  Rest Procedure  Myocardial perfusion imaging was performed at rest 45 minutes following the intravenous administration of Myoview Technetium 3m Tetrofosmin.  Stress Procedure  The patient received IV adenosine at 140 mcg/kg/min for 4 minutes. There were no significant changes with infusion. Myoview was injected at the 2 minute mark and quantitative spect images were obtained after a 45 minute delay.  QPS Raw Data Images:  Normal; no motion artifact; normal heart/lung ratio. Stress Images:  Perfusion defects involving the basal septal wall, the anterior wall, the inferior wall, and the apex.  Rest Images:  Similar to stress Subtraction (SDS):  Fixed perfusion defects in  the basal septal wall, the anterior wall, the inferior wall, and the apex.  Transient Ischemic Dilatation:  .99  (Normal <1.22)  Lung/Heart Ratio:  .31  (Normal <0.45)  Quantitative Gated Spect Images QGS EDV:  268 ml QGS ESV:  194 ml QGS EF:  28 % QGS cine images:  The lateral wall appears to have normal function.  The apex is dyskinetic.  Dilated LV.    Overall Impression  Exercise Capacity: Adenosine study with no exercise. BP Response: Normal blood pressure response. Clinical Symptoms: Warmth ECG Impression: LBBB, no change with infusion.  Overall Impression: Large fixed perfusion defects involving the basal septal wall, the anterior and inferior walls, and the apex.  This is suggestive of infarction with minimal ischemia.  Overall Impression Comments: Severe systolic dysfunction.  The lateral wall is preserved.  The apex appears dyskinetic.   Appended Document: Cardiology Nuclear Study ok  Appended Document: Cardiology Nuclear Study pt aware of results

## 2011-01-09 NOTE — Medication Information (Signed)
Summary: rov/tm  Anticoagulant Therapy  Managed by: Alinda Deem, PharmD, BCPS, CPP Referring MD: Kirk Ruths MD Supervising MD: Haroldine Laws MD, Quillian Quince Indication 1: Atrial Fibrillation (ICD-427.31) Lab Used: [place] Site: [facility] INR POC 2.5 INR RANGE 2 - 3  Dietary changes: no    Health status changes: no    Bleeding/hemorrhagic complications: no    Recent/future hospitalizations: no    Any changes in medication regimen? no    Recent/future dental: no  Any missed doses?: no       Is patient compliant with meds? yes       Allergies (verified): 1)  ! * Rosuvastatin 2)  ! * Ilosone  Anticoagulation Management History:      The patient is taking warfarin and comes in today for a routine follow up visit.  Positive risk factors for bleeding include an age of 71 years or older and presence of serious comorbidities.  Negative risk factors for bleeding include no history of CVA/TIA.  The bleeding index is 'intermediate risk'.  Positive CHADS2 values include History of CHF, History of HTN, and History of Diabetes.  Negative CHADS2 values include Age > 71 years old and Prior Stroke/CVA/TIA.  The start date was 01/03/2006.  His last INR was 3.0 ratio.  Anticoagulation responsible provider: Linnaea Ahn MD, Quillian Quince.  INR POC: 2.5.  Cuvette Lot#: [id number].  Exp: 03/2011.    Anticoagulation Management Assessment/Plan:      The patient's current anticoagulation dose is Warfarin sodium 5 mg  tabs: take as directed.  The target INR is 2 - 3.  The next INR is due 03/05/2010.  Anticoagulation instructions were given to patient.  Results were reviewed/authorized by Alinda Deem, PharmD, BCPS, CPP.  He was notified by Alinda Deem PharmD, BCPS, CPP.         Prior Anticoagulation Instructions: INR 2.5 Continue 5mg s everyday except 2.5mg s on Mondays, Wednesdays and Fridays. Recheck in 4 weeks.   Current Anticoagulation Instructions: INR 2.5  Continue 1 tab daily except 0.5 tab each  Monday, Wednesday, Friday.  Recheck in 4 weeks.

## 2011-01-09 NOTE — Letter (Signed)
Summary: Device-Delinquent Phone Proofreader, [place]  [address] [place]   [place], [place] [address]   Phone: [phone]  Fax: [phone]     July 05, 2010 MRN: [id number]   EDDIEL THAN [place], [place]  [address]   Dear Mr. BARTKIEWICZ,  According to our records, you were scheduled for a device phone transmission on 07-04-2010.     We did not receive any results from this check.  If you transmitted on your scheduled day, please call us to help troubleshoot your system.  If you forgot to send your transmission, please send one upon receipt of this letter.  Thank you,   [facility]

## 2011-01-09 NOTE — Cardiovascular Report (Signed)
Summary: Office Visit   Office Visit   Imported By: Sallee Provencal 10/07/2010 12:49:40  _____________________________________________________________________  External Attachment:    Type:   Image     Comment:   External Document

## 2011-01-09 NOTE — Letter (Signed)
Summary: Primary Care Appointment Letter  [facility] [place]   [place], [place] [address]   Phone: [phone]  Fax: [phone]    04/18/2010 MRN: [id number]  [place],   [address]  Dear Mr. TARTAGLIA,   Your Primary Care Physician Unice Cobble MD has indicated that:    __X_____it is time to schedule an appointment.    _______you missed your appointment on______ and need to call and          reschedule.    _______you need to have lab work done.    _______you need to schedule an appointment discuss lab or test results.    _______you need to call to reschedule your appointment that is                       scheduled on _________.     Please call our office as soon as possible. Our phone number is [phone]. Please press option 1. Our office is open 8a-12noon and 1p-5p, Monday through Friday.     Thank you,    [facility]

## 2011-01-09 NOTE — Cardiovascular Report (Signed)
Summary: Office Visit   Office Visit   Imported By: Sallee Provencal 01/03/2010 14:04:29  _____________________________________________________________________  External Attachment:    Type:   Image     Comment:   External Document

## 2011-01-09 NOTE — Progress Notes (Signed)
Summary: Referral  Phone Note Call from Patient   Summary of Call: PATIENT WIFE CAME IN FOR AN APPT AND ASK IF WE CAN REFERRAL HER HUSBAND TO SEE THIS DOC--VAN TRIGHT HE IS OFF OF [place]. Initial call taken by: Velora Heckler,  May 29, 2010 10:08 AM  Follow-up for Phone Call        It would be best for Dr Caryl Comes or Dr Loralie Champagne his Cardiology specialists to make such a referral rather than his Internist Follow-up by: Unice Cobble MD,  May 29, 2010 4:18 PM  Additional Follow-up for Phone Call Additional follow up Details #1::        Spoke with patient's wife and patient, information ok'd Additional Follow-up by: Georgette Dover,  May 29, 2010 4:35 PM

## 2011-01-09 NOTE — Assessment & Plan Note (Signed)
Summary: med refill /cbs   Vital Signs:  Patient profile:   70 year old male Height:      66.25 inches Weight:      188.4 pounds BMI:     30.29 Temp:     98.5 degrees F oral Pulse rate:   60 / minute Resp:     14 per minute BP sitting:   120 / 72  (left arm) Cuff size:   large  Vitals Entered By: Georgette Dover (May 21, 2010 1:12 PM) CC: Yearly follow-up Comments REVIEWED MED LIST, PATIENT AGREED DOSE AND INSTRUCTION CORRECT    CC:  Yearly follow-up.  History of Present Illness: Here for Medicare AWV:  1.   Risk factors based on Past M, S, F history:he identifies no concerns 2.   Physical Activities: walking 1 mpd qod  w/o limitations unless gout active; with gout walker used 3.   Depression/mood: denied 4.   Hearing:whisper WNL @ 6 ft  5.   ADL's: see #1; restrictions when gout free 6.   Fall Risk: none identified 7.   Home Safety: no issues  8.   Height, weight, &visual acuity:vision WNL with lenses ; overdue for Ophth exam 9.   Counseling: Ophth exam needed 10.   Labs ordered based on risk factors: Labs done 11/2009 @ [facility]; done every 6 months 11.           Referral Coordination: Ophth evaluation to be  scheduled @ [facility] 12.           Care Plan: see Instructions 13.            Cognitive Assessment: Oriented X 3   Hypertension Follow-Up      This is a 70 year old man who presents for Hypertension follow-up.  BP 100/70 - 128/78.  The patient reports  intermittent edema, but denies lightheadedness, urinary frequency, headaches, rash, and fatigue.  Associated symptoms include pedal edema.  The patient denies the following associated symptoms: chest pain, chest pressure, exercise intolerance, dyspnea, palpitations, syncope, and leg edema.  Compliance with medications (by patient report) has been near 100%.  The patient reports that dietary compliance has been good.  The patient reports exercising 3-4X per week.  Adjunctive measures currently used by the patient include salt  restriction.                                                                                              DM assessment: FBS 70-116; no 2 hr post meal values. Weight stable; hypoglycemia when at bedtime snack missed.Poorly healed lesion L groin X 6 months with intermittent serous drainage.  Preventive Screening-Counseling & Management  Alcohol-Tobacco     Alcohol drinks/day: 1     Alcohol type: spirits     Alcohol Counseling: not indicated; use of alcohol is not excessive or problematic     Feels need to cut down: no     Smoking Status: never  Caffeine-Diet-Exercise     Caffeine use/day: 3 cups/ day     Diet Comments: low salt, low sugar     Does Patient Exercise: yes  Type of exercise: walking     Exercise (avg: min/session): <30     Times/week: 3     L[place] Depression Score: not needed  Hep-HIV-STD-Contraception     Dental Visit-last 6 months yes  Safety-Violence-Falls     Seat Belt Use: yes     Smoke Detectors: yes     Violence in the Home: no risk noted     Fall Risk: denied      Sexual History:  currently monogamous.        Drug Use:  never.        Blood Transfusions:  no.        Travel History:  no travel.    Allergies: 1)  ! * Rosuvastatin 2)  ! 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  No sibs have no  history of CAD, hypertension, hyperlipidemia, diabetes or stroke  Social History: Never Smoked Retired  Married  Tobacco Use - No.  Regular Exercise - yes Alcohol use-yes Caffeine use/day:  3 cups/ day Dental Care w/in 6 mos.:  yes Seat Belt Use:  yes Fall Risk:  denied Sexual History:  currently monogamous Drug Use:  never Blood Transfusions:  no  Review of Systems General:  Denies chills, fever, and sweats. Eyes:  Denies blurring, double vision, and vision loss-both eyes; Last exam 2009. ENT:  Denies difficulty swallowing and hoarseness. CV:  Denies leg cramps with exertion. Resp:  Denies cough and sputum productive. GI:  Denies abdominal pain, bloody stools, dark tarry stools, and indigestion. GU:  Denies discharge, dysuria, and hematuria. Derm:  Complains of lesion(s) and poor wound healing. Neuro:  Complains of tingling; denies disturbances in coordination, memory loss, numbness, poor balance, and tremors; Occasional tingling in feet. Psych:  Denies anxiety, depression, easily angered, easily tearful, and irritability. Endo:  Denies excessive hunger, excessive thirst, and excessive urination. Heme:  Denies abnormal bruising and bleeding.  Physical Exam  General:  well-nourished,in no acute distress; alert,appropriate and cooperative throughout examination Head:  Normocephalic and atraumatic without obvious abnormalities. No apparent alopecia Eyes:  No corneal or conjunctival inflammation noted. EOMI. Perrla. Funduscopic exam benign, without hemorrhages, exudates or papilledema. Vision grossly normal with lenses Ears:  External ear exam shows no significant lesions or deformities.  Otoscopic examination reveals clear canals, tympanic membranes are intact bilaterally without bulging, retraction, inflammation or discharge but scarred. Hearing is grossly normal bilaterally. Nose:  External nasal  examination shows no deformity or inflammation. Nasal mucosa are pink and moist without lesions or exudates. Mouth:  Oral mucosa and oropharynx without lesions or exudates.  Teeth in good repair. Neck:  No deformities, masses, or tenderness noted. Chest Wall:  Defibrillator L chest Lungs:  Normal respiratory effort, chest expands symmetrically. Lungs are clear to auscultation, no crackles or wheezes. Heart:  normal rate, regular rhythm, no gallop,  no rub, no JVD, no HJR, and grade [id number] /6 systolic murmur.   Abdomen:  Bowel sounds positive,abdomen soft and non-tender without masses, organomegaly or hernias noted. Genitalia:  (GU) " exam done @ [facility]" Msk:  No deformity or scoliosis noted of thoracic or lumbar spine.   Pulses:  R and L carotid,radial,dorsalis pedis and posterior tibial pulses are full and equal bilaterally Extremities:  No clubbing, cyanosis, edema. Mild OA DIP finger deformities  noted with normal full range of motion of all joints.   Neurologic:  alert & oriented X3, strength normal in all extremities, and DTRs symmetrical and normal.   Skin:  Intact without suspicious lesions or rashes Cervical Nodes:  No lymphadenopathy noted Axillary Nodes:  No palpable lymphadenopathy Psych:  memory intact for recent and remote, normally interactive, good eye contact, not anxious appearing, and not depressed appearing.     Impression & Recommendations:  Problem # 1:  PREVENTIVE HEALTH CARE (ICD-V70.0)  Orders: Subsequent annual wellness visit with prevention plan [id number])  Problem # 2:  DIABETES MELLITUS, TYPE II, CONTROLLED (ICD-250.00)  but see # 3 His updated medication list for this problem includes:    Lantus 100 Unit/ml Soln (Insulin glargine) .Marland KitchenMarland KitchenMarland KitchenMarland Kitchen 105 units at bedtime    Ramipril 2.5 Mg Caps (Ramipril) .Marland Kitchen... 1 once daily  Orders: Venipuncture [id number]) TLB-A1C / Hgb A1C (Glycohemoglobin) (83036-A1C) TLB-Microalbumin/Creat Ratio, Urine (82043-MALB)  Problem # 3:   HYPOGLYCEMIA, HX OF (ICD-V12.2) when at bedtime snacks missed  Problem # 4:  CARDIOMYOPATHY, ISCHEMIC (ICD-414.8) as per Dr Stanford Breed His updated medication list for this problem includes:    Furosemide 40 Mg Tabs (Furosemide) .Marland Kitchen... Take one tablet twice daily    Spironolactone 25 Mg Tabs (Spironolactone) .Marland Kitchen... Take 1/2  tablet daily    Coreg 25 Mg Tabs (Carvedilol) .Marland Kitchen... 1/2  by mouth two times a day    Ramipril 2.5 Mg Caps (Ramipril) .Marland Kitchen... 1 once daily  Problem # 5:  GOUT (ICD-274.9)  Orders: Venipuncture [id number])  Problem # 6:  UNSPECIFIED ESSENTIAL HYPERTENSION (ICD-401.9)  His updated medication list for this problem includes:    Furosemide 40 Mg Tabs (Furosemide) .Marland Kitchen... Take one tablet twice daily    Spironolactone 25 Mg Tabs (Spironolactone) .Marland Kitchen... Take 1/2  tablet daily    Coreg 25 Mg Tabs (Carvedilol) .Marland Kitchen... 1/2  by mouth two times a day    Ramipril 2.5 Mg Caps (Ramipril) .Marland Kitchen... 1 once daily  Orders: Venipuncture [id number]) TLB-Creatinine, Blood (82565-CREA) TLB-Potassium (K+) (84132-K) TLB-BUN (Urea Nitrogen) (84520-BUN) Prescription Created Electronically [phone])  Complete Medication List: 1)  Digoxin 0.125 Mg Tabs (Digoxin) .... Take one tablet daily 2)  Furosemide 40 Mg Tabs (Furosemide) .... Take one tablet twice daily 3)  Spironolactone 25 Mg Tabs (Spironolactone) .... Take 1/2  tablet daily 4)  Calcium 600 Mg Tabs (Calcium) .Marland Kitchen.. 1 tab by mouth once daily 5)  Folic Acid [id number] Mcg Tabs (Folic acid) .Marland Kitchen.. 1 tab by mouth once daily 6)  Warfarin Sodium 5 Mg Tabs (Warfarin sodium) .... Take as directed 7)  Fluoxetine Hcl 10 Mg Caps (Fluoxetine hcl) .Marland Kitchen.. 1 by mouth qd 8)  Simvastatin 20 Mg Tabs (Simvastatin) .Marland Kitchen.. 1 by mouth once daily 9)  Pepcid 1 Po Qd  10)  Multivitamins Tabs (Multiple vitamin) .Marland Kitchen.. 1 tab by mouth once daily 11)  Acetaminophen 500 Mg Caps (Acetaminophen) .... As needed 12)  Claritin 10mg  1 Po Qd Prn  13)  Coreg 25 Mg Tabs (Carvedilol) .... 1/2  by mouth two  times  a day 14)  Lovaza 1 Gm Caps (Omega-3-acid ethyl esters) .... 2 tabs by mouth two times a day 15)  Niacin Cr 750 Mg Cr-tabs (Niacin) .... 2  tab by mouth at bedtime 16)  Lantus 100 Unit/ml Soln (Insulin glargine) .Marland Kitchen.. 105 units at bedtime 17)  Ramipril 2.5 Mg Caps (Ramipril) .Marland Kitchen.. 1 once daily  Other Orders: Pneumococcal Vaccine [id number]) Admin 1st Vaccine [id number]) TLB-Uric Acid, Blood (84550-URIC)  Patient Instructions: 1)  Limit your Sodium (Salt) to less than 2 grams a day(slightly less than 1/2 a teaspoon) to prevent fluid retention, swelling, or worsening of symptoms. 2)  Take an 81 mg coated Aspirin every day. 3)  Check your blood sugars regularly. If your readings are usually above : 150 or below 90 you should contact our office. 4)  See your eye doctor yearly to check for diabetic eye damage. 5)  Check your feet each night for sore areas, calluses or signs of infection. 6)  Check your Blood Pressure regularly. If it is above:140/90 ON AVERAGE  you should make an appointment. 7)  Choose your Health care [facility] and/or prepare a Living Will.   Orders Added: 1)  Pneumococcal Vaccine [90732] 2)  Admin 1st Vaccine [90471] 3)  Venipuncture [id number] 4)  TLB-Creatinine, Blood [82565-CREA] 5)  TLB-Potassium (K+) [84132-K] 6)  TLB-Uric Acid, Blood [84550-URIC] 7)  TLB-BUN (Urea Nitrogen) [84520-BUN] 8)  TLB-A1C / Hgb A1C (Glycohemoglobin) [83036-A1C] 9)  TLB-Microalbumin/Creat Ratio, Urine [82043-MALB] 10)  Subsequent annual wellness visit with prevention plan [id number])  Est. Patient Level III [id number] 12)  Prescription Created Electronically [id number]    Immunization History:  Tetanus/Td Immunization History:    Tetanus/Td:  historical (11/10/2003)  Immunizations Administered:  Pneumonia Vaccine:    Vaccine Type: Pneumovax (Medicare)    Site: right deltoid    Mfr: Merck    Dose: 0.5 ml    Route: IM    Given by: Georgette Dover    Exp. Date: 10/02/2011    Lot  #: [id number]    VIS given: 07/05/96 version given May 21, 2010.   Appended Document: med refill /cbs 2X 0.5 cm subcutaneous hyperpigmented tissue L inguinal area w/o cellulitis or LA.R/O sinus tract related prior vaascular procedure. CVTS referral recommended.

## 2011-01-09 NOTE — Medication Information (Signed)
Summary: rov/ewj  Anticoagulant Therapy  Managed by: Romeo Rabon, PharmD Referring MD: Kirk Ruths MD PCP: Dr. Katherene Ponto MD: Haroldine Laws MD, Quillian Quince Indication 1: Atrial Fibrillation (ICD-427.31) Lab Used: [place] Site: [facility] INR POC 2 INR RANGE 2 - 3  Dietary changes: no    Health status changes: no    Bleeding/hemorrhagic complications: no    Recent/future hospitalizations: no    Any changes in medication regimen? yes       Details: started allopurinol  Recent/future dental: no  Any missed doses?: no       Is patient compliant with meds? yes       Current Medications (verified): 1)  Digoxin 0.125 Mg  Tabs (Digoxin) .... Take One Tablet Daily 2)  Furosemide 40 Mg  Tabs (Furosemide) .... Take One Tablet Twice Daily 3)  Spironolactone 25 Mg  Tabs (Spironolactone) .... Take 1/2  Tablet Daily 4)  Calcium 600 Mg  Tabs (Calcium) .Marland Kitchen.. 1 Tab By Mouth Once Daily 5)  Folic Acid [id number] Mcg  Tabs (Folic Acid) .Marland Kitchen.. 1 Tab By Mouth Once Daily 6)  Warfarin Sodium 5 Mg  Tabs (Warfarin Sodium) .... Take As Directed 7)  Fluoxetine Hcl 10 Mg  Caps (Fluoxetine Hcl) .Marland Kitchen.. 1 By Mouth Qd 8)  Simvastatin 20 Mg Tabs (Simvastatin) .Marland Kitchen.. 1 By Mouth Once Daily 9)  Pepcid .Marland Kitchen.. 1 Tab By Mouth Once Daily 10)  Multivitamins   Tabs (Multiple Vitamin) .Marland Kitchen.. 1 Tab By Mouth Once Daily 11)  Acetaminophen 500 Mg  Caps (Acetaminophen) .... As Needed 12)  Claritin 10 Mg Tabs (Loratadine) .... As Needed 13)  Coreg 25 Mg  Tabs (Carvedilol) .... 1/2  By Mouth Two Times A Day 14)  Lovaza 1 Gm  Caps (Omega-3-Acid Ethyl Esters) .... 2 Tabs By Mouth Two Times A Day 15)  Niacin Cr 750 Mg Cr-Tabs (Niacin) .... 2  Tab By Mouth At Bedtime 16)  Lantus 100 Unit/ml Soln (Insulin Glargine) .Marland Kitchen.. 105 Units At Bedtime 17)  Ramipril 2.5 Mg Caps (Ramipril) .Marland Kitchen.. 1 Once Daily 18)  Aspirin 81 Mg Tbec (Aspirin) .... Take One Tablet By Mouth Daily 19)  Allopurinol 100 Mg Tabs (Allopurinol) .Marland Kitchen.. 1 By Mouth Once  Daily, Check Pt/inr 5-7 Days After Starting Med  Allergies (verified): 1)  ! * Rosuvastatin 2)  ! * Ilosone 3)  ! Ampicillin 4)  ! Darvocet  Anticoagulation Management History:      The patient is taking warfarin and comes in today for a routine follow up visit.  Positive risk factors for bleeding include an age of 3 years or older and presence of serious comorbidities.  Negative risk factors for bleeding include no history of CVA/TIA.  The bleeding index is 'intermediate risk'.  Positive CHADS2 values include History of CHF, History of HTN, and History of Diabetes.  Negative CHADS2 values include Age > 37 years old and Prior Stroke/CVA/TIA.  The start date was 01/03/2006.  His last INR was 3.0 ratio.  Anticoagulation responsible provider: Bensimhon MD, Quillian Quince.  INR POC: 2.  Cuvette Lot#: [id number].  Exp: 09/2011.    Anticoagulation Management Assessment/Plan:      The patient's current anticoagulation dose is Warfarin sodium 5 mg  tabs: take as directed.  The target INR is 2 - 3.  The next INR is due 08/15/2010.  Anticoagulation instructions were given to patient.  Results were reviewed/authorized by Romeo Rabon, PharmD.  He was notified by Romeo Rabon, PharmD.  Prior Anticoagulation Instructions: INR 2.0  Continue on same dosage 1 tablet daily except 1/2 tablet on Mondays, Wednesdays, and Fridays.  Recheck in 4 weeks.    Current Anticoagulation Instructions: Continue 5mg  daily except 2.5mg  Mon, Wed, Fri.

## 2011-01-09 NOTE — Letter (Signed)
Summary: Remote Device Check  [place], [place]  [id number] [place]   [place], [place] [address]   Phone: [phone]  Fax: [phone]     July 26, 2010 MRN: [id number]   TANDRE TERRACCIANO [place], [place]  [id number]   Dear Mr. TRITES,   Your remote transmission was recieved and reviewed by your physician.  All diagnostics were within normal limits for you.  __X___Your next transmission is scheduled for:  10-17-2010.  Please transmit at any time this day.  If you have a wireless device your transmission will be sent automatically.    Sincerely,  Shelly Bombard

## 2011-01-09 NOTE — Medication Information (Signed)
Summary: rov/ewj  Anticoagulant Therapy  Managed by: Freddrick March, RN, BSN Referring MD: Kirk Ruths MD Supervising MD: Johnsie Cancel MD, Collier Salina Indication 1: Atrial Fibrillation (ICD-427.31) Lab Used: [place] Site: [facility] INR POC 2.5 INR RANGE 2 - 3  Dietary changes: no    Health status changes: no    Bleeding/hemorrhagic complications: no    Recent/future hospitalizations: no    Any changes in medication regimen? no    Recent/future dental: no  Any missed doses?: no       Is patient compliant with meds? yes       Allergies (verified): 1)  ! * Rosuvastatin 2)  ! * Ilosone  Anticoagulation Management History:      The patient is taking warfarin and comes in today for a routine follow up visit.  Positive risk factors for bleeding include an age of 71 years or older and presence of serious comorbidities.  Negative risk factors for bleeding include no history of CVA/TIA.  The bleeding index is 'intermediate risk'.  Positive CHADS2 values include History of CHF, History of HTN, and History of Diabetes.  Negative CHADS2 values include Age > 36 years old and Prior Stroke/CVA/TIA.  The start date was 01/03/2006.  His last INR was 3.0 ratio.  Anticoagulation responsible provider: Johnsie Cancel MD, Collier Salina.  INR POC: 2.5.  Cuvette Lot#: [id number].  Exp: 01/2011.    Anticoagulation Management Assessment/Plan:      The patient's current anticoagulation dose is Warfarin sodium 5 mg  tabs: take as directed.  The target INR is 2 - 3.  The next INR is due 01/08/2010.  Anticoagulation instructions were given to patient.  Results were reviewed/authorized by Freddrick March, RN, BSN.  He was notified by Freddrick March RN.         Prior Anticoagulation Instructions: INR 2.8  Continue on same dosage 1 tablet daily except 1/2 tablet on Mondays, Wednesdays, and Fridays.  Recheck in 4 weeks.    Current Anticoagulation Instructions: INR 2.5  Continue on same dosage 1 tablet daily except 1/2 tablet on  Mondays, Wednesdays, and Fridays.   Recheck in 4 weeks.

## 2011-01-09 NOTE — Medication Information (Signed)
Summary: rov/jm  Anticoagulant Therapy  Managed by: Porfirio Oar, PharmD Referring MD: Kirk Ruths MD PCP: Dr. Katherene Ponto MD: Stanford Breed MD, Aaron Edelman Indication 1: Atrial Fibrillation (ICD-427.31) Lab Used: [facility] Site: [facility] INR POC 2.0 INR RANGE 2 - 3  Dietary changes: no    Health status changes: yes       Details: Pt complains of gout pain in knees after driving to [place]  Bleeding/hemorrhagic complications: no    Recent/future hospitalizations: no    Any changes in medication regimen? no    Recent/future dental: no  Any missed doses?: no         Allergies: 1)  ! * Rosuvastatin 2)  ! * Ilosone 3)  ! Ampicillin 4)  ! Darvocet  Anticoagulation Management History:      The patient is taking warfarin and comes in today for a routine follow up visit.  Positive risk factors for bleeding include an age of 70 years or older and presence of serious comorbidities.  Negative risk factors for bleeding include no history of CVA/TIA.  The bleeding index is 'intermediate risk'.  Positive CHADS2 values include History of CHF, History of HTN, and History of Diabetes.  Negative CHADS2 values include Age > 69 years old and Prior Stroke/CVA/TIA.  The start date was 01/03/2006.  His last INR was 3.0 ratio and today's INR is 2.0.  Anticoagulation responsible provider: Stanford Breed MD, Aaron Edelman.  INR POC: 2.0.  Cuvette Lot#: [id number].  Exp: 10/2011.    Anticoagulation Management Assessment/Plan:      The patient's current anticoagulation dose is Warfarin sodium 5 mg  tabs: take as directed.  The target INR is 2 - 3.  The next INR is due 10/17/2010.  Anticoagulation instructions were given to patient.  Results were reviewed/authorized by Porfirio Oar, PharmD.  He was notified by Ralene Bathe, PharmD Candidate.         Prior Anticoagulation Instructions: INR 2.2  Continue taking one tablet every day except for one-half tablet on Monday, Wednesday, and Friday.  We will see you in 4  weeks.  Current Anticoagulation Instructions: INR 2.0  Continue Coumadin as scheduled:   1 tablet every day of the week, except 1/2 tablet on Monday, Wednesday, and Friday.   Return to clinic in 4 weeks.

## 2011-01-09 NOTE — Progress Notes (Signed)
Summary: NUCLEAR PRE PROCEDURE  Phone Note Outgoing Call Call back at [facility] Phone [phone]   Call placed by: Valetta Fuller, [place],  June 26, 2010 3:44 PM Call placed to: Patient Summary of Call: Reviewed information on Myoview Information Sheet (see scanned document for further details).  Spoke with patient.      Nuclear Med Background Indications for Stress Test: Evaluation for Ischemia, Graft Patency   History: CABG, Defibrillator, Heart Catheterization, Myocardial Infarction, Myocardial Perfusion Study  History Comments: '00 MI>CABG x 4; 1/08 Cath:diffuse CAD, EF=15-20%; 10/08 [id number], apical and inferior wall infarct, no ischemia, EF=27%; 10/10 AICD; h/o ICM, PAF, V-tach     Nuclear Pre-Procedure Cardiac Risk Factors: Hypertension, IDDM Type 2, LBBB, Lipids Height (in): 36  Nuclear Med Study Referring MD:  Kirk Ruths MD

## 2011-01-09 NOTE — Medication Information (Signed)
Summary: rov/eac  Anticoagulant Therapy  Managed by: Gwynneth Albright, PharmD Referring MD: Kirk Ruths MD Supervising MD: Aundra Dubin MD, [place] Indication 1: Atrial Fibrillation (ICD-427.31) Lab Used: [facility] Site: [facility] INR POC 2.0 INR RANGE 2 - 3  Dietary changes: no    Health status changes: no    Bleeding/hemorrhagic complications: no    Recent/future hospitalizations: no    Any changes in medication regimen? no    Recent/future dental: no  Any missed doses?: no       Is patient compliant with meds? yes       Allergies: 1)  ! * Rosuvastatin 2)  ! * Ilosone  Anticoagulation Management History:      The patient is taking warfarin and comes in today for a routine follow up visit.  Positive risk factors for bleeding include an age of 70 years or older and presence of serious comorbidities.  Negative risk factors for bleeding include no history of CVA/TIA.  The bleeding index is 'intermediate risk'.  Positive CHADS2 values include History of CHF, History of HTN, and History of Diabetes.  Negative CHADS2 values include Age > 98 years old and Prior Stroke/CVA/TIA.  The start date was 01/03/2006.  His last INR was 3.0 ratio.  Anticoagulation responsible provider: Aundra Dubin MD, [place].  INR POC: 2.0.  Cuvette Lot#: [id number].  Exp: 05/2011.    Anticoagulation Management Assessment/Plan:      The patient's current anticoagulation dose is Warfarin sodium 5 mg  tabs: take as directed.  The target INR is 2 - 3.  The next INR is due 04/30/2010.  Anticoagulation instructions were given to patient.  Results were reviewed/authorized by Gwynneth Albright, PharmD.  He was notified by Gwynneth Albright, PharmD.         Prior Anticoagulation Instructions: INR 2.7  Continue taking 1/2 tablet on Monday, Wednesday, and Friday and take 1 tablet all other days.  Return to clinic in 4 weeks.   Current Anticoagulation Instructions: INR 2.0. Take 1 tablet daily except 0.5 tablet on Mon, Wed, Fri.

## 2011-01-09 NOTE — Assessment & Plan Note (Signed)
Summary: F1Y/DM   Primary Provider:  Dr. Linna Darner  CC:  no compalints.  History of Present Illness: Mason Arnold is a pleasant gentleman who has a history of coronary artery disease status post coronary bypass graft, as well as an ischemic cardiomyopathy and hyperlipidemia.  His last Myoview was performed on September 22, 2007.  At that time, he had an ejection fraction of 27%. There was a previous infarct involving the anterior wall, apex, and inferior wall, but there was no ischemia noted. I last saw him in July of 2010. Since then he had been seen by Dr. Caryl Comes and his ICD generator changed. He also apparently was having orthostatic hypotension and medications were decreased. He now denies dyspnea on exertion, orthopnea, PND, pedal edema, palpitations, syncope or chest pain.  Current Medications (verified): 1)  Digoxin 0.125 Mg  Tabs (Digoxin) .... Take One Tablet Daily 2)  Furosemide 40 Mg  Tabs (Furosemide) .... Take One Tablet Twice Daily 3)  Spironolactone 25 Mg  Tabs (Spironolactone) .... Take 1/2  Tablet Daily 4)  Calcium 600 Mg  Tabs (Calcium) .Marland Kitchen.. 1 Tab By Mouth Once Daily 5)  Folic Acid [id number] Mcg  Tabs (Folic Acid) .Marland Kitchen.. 1 Tab By Mouth Once Daily 6)  Warfarin Sodium 5 Mg  Tabs (Warfarin Sodium) .... Take As Directed 7)  Fluoxetine Hcl 10 Mg  Caps (Fluoxetine Hcl) .Marland Kitchen.. 1 By Mouth Qd 8)  Simvastatin 20 Mg Tabs (Simvastatin) .Marland Kitchen.. 1 By Mouth Once Daily 9)  Pepcid .Marland Kitchen.. 1 Tab By Mouth Once Daily 10)  Multivitamins   Tabs (Multiple Vitamin) .Marland Kitchen.. 1 Tab By Mouth Once Daily 11)  Acetaminophen 500 Mg  Caps (Acetaminophen) .... As Needed 12)  Claritin 10 Mg Tabs (Loratadine) .... As Needed 13)  Coreg 25 Mg  Tabs (Carvedilol) .... 1/2  By Mouth Two Times A Day 14)  Lovaza 1 Gm  Caps (Omega-3-Acid Ethyl Esters) .... 2 Tabs By Mouth Two Times A Day 15)  Niacin Cr 750 Mg Cr-Tabs (Niacin) .... 2  Tab By Mouth At Bedtime 16)  Lantus 100 Unit/ml Soln (Insulin Glargine) .Marland Kitchen.. 105 Units At Bedtime 17)   Ramipril 2.5 Mg Caps (Ramipril) .Marland Kitchen.. 1 Once Daily 18)  Aspirin 81 Mg Tbec (Aspirin) .... Take One Tablet By Mouth Daily  Allergies: 1)  ! * Rosuvastatin 2)  ! * Ilosone  Past History:  Past Medical History: Diabetes mellitus, type II Gout Myocardial infarction,PMH  of 2000 Ventricular tachycardia ,PMH of  Atrial fibrillation, paroxysmal, PMH of  Ischemic heart disease Guidant ICD-vitality T125 2002 Congestive heart failure - class II - chronic - systolic Left bundle-branch block Hyperlipidemia  Past Surgical History: Reviewed history from 05/21/2010 and no changes required. Coronary artery bypass graft X 4 in 10/ 2000; last cath 01/08 EF 15-20%, diffuse disease Urecal cyst(bladder) resection 1994 Inguinal herniorrhaphy 1979 Tonsillectomy Vasectomy 1970 Pacer/ defibrillator-Guidant Vitality T125  X 3  Social History: Reviewed history from 05/21/2010 and no changes required. Never Smoked Retired  Married  Tobacco Use - No.  Regular Exercise - yes Alcohol use-yes  Review of Systems       no fevers or chills, productive cough, hemoptysis, dysphasia, odynophagia, melena, hematochezia, dysuria, hematuria, rash, seizure activity, orthopnea, PND, pedal edema, claudication. Remaining systems are negative.   Vital Signs:  Patient profile:   70 year old male Height:      66 inches Weight:      189 pounds BMI:     30.62 Pulse rate:   62 /  minute Resp:     14 per minute BP sitting:   115 / 80  (left arm)  Vitals Entered By: Burnett Kanaris (June 20, 2010 8:05 AM)  Physical Exam  General:  Well-developed well-nourished in no acute distress.  Skin is warm and dry.  HEENT is normal.  Neck is supple. No thyromegaly.  Chest is clear to auscultation with normal expansion.  Cardiovascular exam is regular rate and rhythm. 2/6 systolic ejection murmur. Abdominal exam nontender or distended. No masses palpated. Extremities show no edema. neuro grossly intact     ICD  Specifications Following MD:  Virl Axe, MD     Referring MD:  Apostolos Blagg ICD Vendor:  [employer]     ICD Model Number:  [id number]     ICD Serial Number:  [id number] ICD DOI:  09/19/2009     ICD Implanting MD:  Virl Axe, MD  Lead 1:    Location: [facility]     DOI: 04/19/2001     Model #: [id number]     Serial #: [id number]     Status: active Lead 2:    Location: [facility]     DOI: 04/19/2001     Model #: [id number]     Serial #[id number]     Status: active  Indications::  VT  Explantation Comments: 09-19-09 BSX T125 EXPLANTED  ICD Follow Up ICD Dependent:  No      Episodes Coumadin:  Yes  Brady Parameters Mode DDDR     Lower Rate Limit:  60     Upper Rate Limit 110 PAV 275     Sensed AV Delay:  250 Rate Response Parameters:  slope to 8, reaction time to medium, threshold Auto (+0.0).  Tachy Zones VF:  240     VT:  210     VT1:  171     Impression & Recommendations:  Problem # 1:  IMPLANTATION OF DEFIBRILLATOR,ST JUDE (ICD-V45.02) Management her EP.  Problem # 2:  ATRIAL FIBRILLATION-PAROXYSMAL (ICD-427.31) Continue Coreg, digoxin and Coumadin. His updated medication list for this problem includes:    Digoxin 0.125 Mg Tabs (Digoxin) .Marland Kitchen... Take one tablet daily    Warfarin Sodium 5 Mg Tabs (Warfarin sodium) .Marland Kitchen... Take as directed    Coreg 25 Mg Tabs (Carvedilol) .Marland Kitchen... 1/2  by mouth two times a day    Aspirin 81 Mg Tbec (Aspirin) .Marland Kitchen... Take one tablet by mouth daily  His updated medication list for this problem includes:    Digoxin 0.125 Mg Tabs (Digoxin) .Marland Kitchen... Take one tablet daily    Warfarin Sodium 5 Mg Tabs (Warfarin sodium) .Marland Kitchen... Take as directed    Coreg 25 Mg Tabs (Carvedilol) .Marland Kitchen... 1/2  by mouth two times a day  Problem # 3:  COUMADIN THERAPY (ICD-V58.61) Monitored in the Coumadin clinic. Goal INR 2-3. Hemoglobin followed at the [place].  Problem # 4:  CARDIOMYOPATHY, ISCHEMIC (ICD-414.8)  Continue ACE inhibitor, beta blocker and digoxin. His updated medication list for this problem includes:     Digoxin 0.125 Mg Tabs (Digoxin) .Marland Kitchen... Take one tablet daily    Furosemide 40 Mg Tabs (Furosemide) .Marland Kitchen... Take one tablet twice daily    Spironolactone 25 Mg Tabs (Spironolactone) .Marland Kitchen... Take 1/2  tablet daily    Warfarin Sodium 5 Mg Tabs (Warfarin sodium) .Marland Kitchen... Take as directed    Coreg 25 Mg Tabs (Carvedilol) .Marland Kitchen... 1/2  by mouth two times a day    Ramipril 2.5 Mg Caps (Ramipril) .Marland Kitchen... 1 once daily  Aspirin 81 Mg Tbec (Aspirin) .Marland Kitchen... Take one tablet by mouth daily  His updated medication list for this problem includes:    Digoxin 0.125 Mg Tabs (Digoxin) .Marland Kitchen... Take one tablet daily    Furosemide 40 Mg Tabs (Furosemide) .Marland Kitchen... Take one tablet twice daily    Spironolactone 25 Mg Tabs (Spironolactone) .Marland Kitchen... Take 1/2  tablet daily    Warfarin Sodium 5 Mg Tabs (Warfarin sodium) .Marland Kitchen... Take as directed    Coreg 25 Mg Tabs (Carvedilol) .Marland Kitchen... 1/2  by mouth two times a day    Ramipril 2.5 Mg Caps (Ramipril) .Marland Kitchen... 1 once daily  Problem # 5:  HYPERLIPIDEMIA (ICD-272.2)  Continue present medications. Lipids and liver monitored at [place]. His updated medication list for this problem includes:    Simvastatin 20 Mg Tabs (Simvastatin) .Marland Kitchen... 1 by mouth once daily    Lovaza 1 Gm Caps (Omega-3-acid ethyl esters) .Marland Kitchen... 2 tabs by mouth two times a day    Niacin Cr 750 Mg Cr-tabs (Niacin) .Marland Kitchen... 2  tab by mouth at bedtime  His updated medication list for this problem includes:    Simvastatin 20 Mg Tabs (Simvastatin) .Marland Kitchen... 1 by mouth once daily    Lovaza 1 Gm Caps (Omega-3-acid ethyl esters) .Marland Kitchen... 2 tabs by mouth two times a day    Niacin Cr 750 Mg Cr-tabs (Niacin) .Marland Kitchen... 2  tab by mouth at bedtime  Problem # 6:  UNSPECIFIED ESSENTIAL HYPERTENSION (ICD-401.9) Blood pressure controlled on present medications. Potassium and renal function monitored at the [place]. His updated medication list for this problem includes:    Furosemide 40 Mg Tabs (Furosemide) .Marland Kitchen... Take one tablet twice daily    Spironolactone 25 Mg Tabs  (Spironolactone) .Marland Kitchen... Take 1/2  tablet daily    Coreg 25 Mg Tabs (Carvedilol) .Marland Kitchen... 1/2  by mouth two times a day    Ramipril 2.5 Mg Caps (Ramipril) .Marland Kitchen... 1 once daily    Aspirin 81 Mg Tbec (Aspirin) .Marland Kitchen... Take one tablet by mouth daily  His updated medication list for this problem includes:    Furosemide 40 Mg Tabs (Furosemide) .Marland Kitchen... Take one tablet twice daily    Spironolactone 25 Mg Tabs (Spironolactone) .Marland Kitchen... Take 1/2  tablet daily    Coreg 25 Mg Tabs (Carvedilol) .Marland Kitchen... 1/2  by mouth two times a day    Ramipril 2.5 Mg Caps (Ramipril) .Marland Kitchen... 1 once daily  Problem # 7:  DIABETES MELLITUS, TYPE II, CONTROLLED (ICD-250.00)  His updated medication list for this problem includes:    Lantus 100 Unit/ml Soln (Insulin glargine) .Marland KitchenMarland KitchenMarland KitchenMarland Kitchen 105 units at bedtime    Ramipril 2.5 Mg Caps (Ramipril) .Marland Kitchen... 1 once daily    Aspirin 81 Mg Tbec (Aspirin) .Marland Kitchen... Take one tablet by mouth daily  Problem # 8:  CAD (ICD-414.00) Continue present medications. Schedule Myoview for risk stratification. His updated medication list for this problem includes:    Warfarin Sodium 5 Mg Tabs (Warfarin sodium) .Marland Kitchen... Take as directed    Coreg 25 Mg Tabs (Carvedilol) .Marland Kitchen... 1/2  by mouth two times a day    Ramipril 2.5 Mg Caps (Ramipril) .Marland Kitchen... 1 once daily    Aspirin 81 Mg Tbec (Aspirin) .Marland Kitchen... Take one tablet by mouth daily  Other Orders: Nuclear Stress Test (Nuc Stress Test)  Patient Instructions: 1)  Your physician recommends that you schedule a follow-up appointment in: [place] 2)  Your physician recommends that you continue on your current medications as directed. Please refer to the Current Medication list  given to you today. 3)  Your physician has requested that you have an [place].  For further information please visit [email or web address].  Please follow instruction sheet, as given.

## 2011-01-09 NOTE — Medication Information (Signed)
Summary: rov/sp  Anticoagulant Therapy  Managed by: Porfirio Oar, PharmD Referring MD: Kirk Ruths MD Supervising MD: Haroldine Laws MD, Quillian Quince Indication 1: Atrial Fibrillation (ICD-427.31) Lab Used: [facility] Site: [facility] INR POC 2.6 INR RANGE 2 - 3  Dietary changes: no    Health status changes: no    Bleeding/hemorrhagic complications: no    Recent/future hospitalizations: no    Any changes in medication regimen? no    Recent/future dental: no  Any missed doses?: no       Is patient compliant with meds? yes       Allergies: 1)  ! * Rosuvastatin 2)  ! * Ilosone  Anticoagulation Management History:      The patient is taking warfarin and comes in today for a routine follow up visit.  Positive risk factors for bleeding include an age of 70 years or older and presence of serious comorbidities.  Negative risk factors for bleeding include no history of CVA/TIA.  The bleeding index is 'intermediate risk'.  Positive CHADS2 values include History of CHF, History of HTN, and History of Diabetes.  Negative CHADS2 values include Age > 37 years old and Prior Stroke/CVA/TIA.  The start date was 01/03/2006.  His last INR was 3.0 ratio.  Anticoagulation responsible provider: Chaunte Hornbeck MD, Quillian Quince.  INR POC: 2.6.  Cuvette Lot#: [id number].  Exp: 08/2011.    Anticoagulation Management Assessment/Plan:      The patient's current anticoagulation dose is Warfarin sodium 5 mg  tabs: take as directed.  The target INR is 2 - 3.  The next INR is due 06/25/2010.  Anticoagulation instructions were given to patient.  Results were reviewed/authorized by Porfirio Oar, PharmD.  He was notified by Porfirio Oar PharmD.         Prior Anticoagulation Instructions: INR 1.9  Take 1 1/2 tablets today then resume same dose of 1 tablet every day except 1/2 tablet on Monday, Wednesday and Friday   Current Anticoagulation Instructions: INR 2.6  Continue same dose of 1 tablet every day except 1/2 tablet on  Monday, Wednesday and Friday

## 2011-01-09 NOTE — Medication Information (Signed)
Summary: Mason Arnold  Anticoagulant Therapy  Managed by: Porfirio Oar, PharmD Referring MD: Kirk Ruths MD Supervising MD: Stanford Breed MD, Aaron Edelman Indication 1: Atrial Fibrillation (ICD-427.31) Lab Used: [facility] Site: [facility] INR POC 1.9 INR RANGE 2 - 3  Dietary changes: yes       Details: diet has been slightly different; had death in the family   Health status changes: no    Bleeding/hemorrhagic complications: no    Recent/future hospitalizations: no    Any changes in medication regimen? no    Recent/future dental: no  Any missed doses?: no       Is patient compliant with meds? yes       Allergies: 1)  ! * Rosuvastatin 2)  ! * Ilosone  Anticoagulation Management History:      The patient is taking warfarin and comes in today for a routine follow up visit.  Positive risk factors for bleeding include an age of 70 years or older and presence of serious comorbidities.  Negative risk factors for bleeding include no history of CVA/TIA.  The bleeding index is 'intermediate risk'.  Positive CHADS2 values include History of CHF, History of HTN, and History of Diabetes.  Negative CHADS2 values include Age > 11 years old and Prior Stroke/CVA/TIA.  The start date was 01/03/2006.  His last INR was 3.0 ratio.  Anticoagulation responsible provider: Stanford Breed MD, Aaron Edelman.  INR POC: 1.9.  Cuvette Lot#: [id number].  Exp: 07/2011.    Anticoagulation Management Assessment/Plan:      The patient's current anticoagulation dose is Warfarin sodium 5 mg  tabs: take as directed.  The target INR is 2 - 3.  The next INR is due 05/28/2010.  Anticoagulation instructions were given to patient.  Results were reviewed/authorized by Porfirio Oar, PharmD.  He was notified by Porfirio Oar PharmD.         Prior Anticoagulation Instructions: INR 2.0. Take 1 tablet daily except 0.5 tablet on Mon, Wed, Fri.  Current Anticoagulation Instructions: INR 1.9  Take 1 1/2 tablets today then resume same dose of 1 tablet every  day except 1/2 tablet on Monday, Wednesday and Friday

## 2011-01-09 NOTE — Medication Information (Signed)
Summary: rov/sp  Anticoagulant Therapy  Managed by: Mason March, Mason Arnold, Mason Arnold Referring MD: Kirk Ruths MD Supervising MD: Stanford Breed MD, Aaron Edelman Indication 1: Atrial Fibrillation (ICD-427.31) Lab Used: [facility] Site: [facility] INR POC 2.0 INR RANGE 2 - 3  Dietary changes: no    Health status changes: no    Bleeding/hemorrhagic complications: no    Recent/future hospitalizations: no    Any changes in medication regimen? yes       Details: Starting on Allopurinol 100mg  daily.   Recent/future dental: no  Any missed doses?: no       Is patient compliant with meds? yes       Allergies: 1)  ! * Rosuvastatin 2)  ! * Ilosone  Anticoagulation Management History:      The patient is taking warfarin and comes in today for a routine follow up visit.  Positive risk factors for bleeding include an age of 63 years or older and presence of serious comorbidities.  Negative risk factors for bleeding include no history of CVA/TIA.  The bleeding index is 'intermediate risk'.  Positive CHADS2 values include History of CHF, History of HTN, and History of Diabetes.  Negative CHADS2 values include Age > 40 years old and Prior Stroke/CVA/TIA.  The start date was 01/03/2006.  His last INR was 3.0 ratio.  Anticoagulation responsible provider: Stanford Breed MD, Aaron Edelman.  INR POC: 2.0.  Cuvette Lot#: [id number].  Exp: 08/2011.    Anticoagulation Management Assessment/Plan:      The patient's current anticoagulation dose is Warfarin sodium 5 mg  tabs: take as directed.  The target INR is 2 - 3.  The next INR is due 07/18/2010.  Anticoagulation instructions were given to patient.  Results were reviewed/authorized by Mason March, Mason Arnold, Mason Arnold.  He was notified by Mason March Mason Arnold.         Prior Anticoagulation Instructions: INR 2.6  Continue same dose of 1 tablet every day except 1/2 tablet on Monday, Wednesday and Friday   Current Anticoagulation Instructions: INR 2.0  Continue on same dosage 1 tablet daily  except 1/2 tablet on Mondays, Wednesdays, and Fridays.  Recheck in 4 weeks.

## 2011-01-09 NOTE — Assessment & Plan Note (Signed)
Summary: Mason Arnold   CC:  Mason Arnold.  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  At that time, he had an ejection fraction of 27%. There was a previous infarct involving the anterior wall, apex, and inferior wall, but there was no ischemia noted.    he is status post ICD implantation; underwent generator replacement in October 2010.  He comes in today with 2 new complaints. He states since his device was implanted. The first is significant dizziness associated with recurrent falls. This is positional. It is aggravated by bending. It is not universal however.  The other complaint is exercise intolerance.It is not accompanied by  chest discomfort. His last Myoview scan was noted as above.  there has been no peripheral edema        Current Medications (verified): 1)  Digoxin 0.125 Mg  Tabs (Digoxin) .... Take One Tablet Daily 2)  Furosemide 40 Mg  Tabs (Furosemide) .... Take One Tablet Twice Daily 3)  Spironolactone 25 Mg  Tabs (Spironolactone) .... Take One Tablet Daily 4)  Calcium 600 Mg  Tabs (Calcium) .Marland Kitchen.. 1 Tab By Mouth Once Daily 5)  Folic Acid A999333 Mcg  Tabs (Folic Acid) .Marland Kitchen.. 1 Tab By Mouth Once Daily 6)  Warfarin Sodium 5 Mg  Tabs (Warfarin Sodium) .... Take As Directed 7)  Fluoxetine Hcl 10 Mg  Caps (Fluoxetine Hcl) .Marland Kitchen.. 1 By Mouth Qd 8)  Simvastatin 40 Mg Tabs (Simvastatin) .... Once Daily 9)  Pepcid 1 Po Qd 10)  Multivitamins   Tabs (Multiple Vitamin) .Marland Kitchen.. 1 Tab By Mouth Once Daily 11)  Acetaminophen 500 Mg  Caps (Acetaminophen) .... As Needed 12)  Claritin 10mg  1 Po Qd Prn 13)  Coreg 25 Mg  Tabs (Carvedilol) .Marland Kitchen.. 1 By Mouth Two Times A Day 14)  Lovaza 1 Gm  Caps (Omega-3-Acid Ethyl Esters) .... 2 Tabs By Mouth Two Times A Day 15)  Niacin Cr 750 Mg Cr-Tabs (Niacin) .... 2  Tab By Mouth At Bedtime 16)  Lantus 100 Unit/ml Soln (Insulin  Glargine) .Marland Kitchen.. 105 Units At Bedtime 17)  Ramipril 2.5 Mg Caps (Ramipril) .Marland Kitchen.. 1 Once Daily  Allergies (verified): 1)  ! * Rosuvastatin 2)  ! * Ilosone  Past History:  Past Medical History: Last updated: 09/11/2009 Congestive heart failure Diabetes mellitus, type II Gout Myocardial infarction, hx of Ventricular tachycardia - quiescent Atrial fibrillation - paroxysmal Ischemic heart disease Guidant ICD-vitality T125 Congestive heart failure - class II - chronic - systolic Left bundle-branch block  Past Surgical History: Last updated: 09/11/2009 Coronary artery bypass graft X 4 2000; last cath 01/08 EF 15-20%, diffuse disease Urecal cyst resection 1994 Inguinal herniorrhaphy 1979 Tonsillectomy Vasectomy 1970 Pacer/ defibrillator-Guidant Vitality T125  Family History: Last updated: 02/14/2009 maternal grandfather MI maternal grandmother MI paternal grandfather MI father MI  Mother died at age 71 of old age.  Father died of an MI and  CHF at age 1.  He has 3 brothers; 1 died of a brain anemia.  He has 4  sisters.  All of his living siblings are alive and well.  They have no  history of CAD, hypertension, hyperlipidemia, diabetes or stroke  Social History: Last updated: 02/14/2009 Never Smoked Retired  Married  Tobacco Use - No.  Alcohol Use - no Regular Exercise - yes  Vital Signs:  Patient profile:  70 year old male Height:      68 inches Weight:      188 pounds BMI:     28.69 Pulse rate:   89 / minute Pulse rhythm:   regular BP sitting:   102 / 73  (left arm) Cuff size:   regular  Vitals Entered By: Doug Sou CMA (January 01, 2010 11:48 AM)  Physical Exam  General:  The patient was alert and oriented in no acute distress. HEENT Normal.  Neck veins were flat, carotids were brisk.  Lungs were clear.  Heart sounds were regular her really quite rapid having gone from a sitting position to a standing positionwithout murmurs or gallops.  Abdomen  was soft with active bowel sounds. There is no clubbing cyanosis or edema. Skin Warm and dry     ICD Specifications Following MD:  Virl Axe, MD     Referring MD:  Stanford Breed ICD Vendor:  Mason Arnold     ICD Model Number:  [id number]     ICD Serial Number:  [id number] ICD DOI:  09/19/2009     ICD Implanting MD:  Virl Axe, MD  Lead 1:    Location: [facility]     DOI: 04/19/2001     Model #: [id number]     Serial #: [id number]     Status: active Lead 2:    Location: [facility]     DOI: 04/19/2001     Model #: [id number]     Serial #[id number]     Status: active  Indications::  VT  Explantation Comments: 09-19-09 BSX T125 EXPLANTED  ICD Follow Up Remote Check?  No Charge Time:  8.0 seconds     Battery Est. Longevity:  7.5 years ICD Dependent:  No       ICD Device Measurements Atrium:  Amplitude: 2.9 mV, Impedance: 480 ohms, Threshold: 0.75 V at 0.4 msec Right Ventricle:  Amplitude: 12 mV, Impedance: 730 ohms, Threshold: 0.75 V at 0.8 msec Shock Impedance: 46 ohms   Episodes MS Episodes:  4     Percent Mode Switch:  1%     Coumadin:  Yes Shock:  0     ATP:  0     Nonsustained:  0     Atrial Pacing:  100%     Ventricular Pacing:  4.2%  Brady Parameters Mode DDDR     Lower Rate Limit:  60     Upper Rate Limit 110 PAV 275     Sensed AV Delay:  250 Rate Response Parameters:  slope to 8, reaction time to medium, threshold Auto (+0.0).  Tachy Zones VF:  240     VT:  210     VT1:  171     Next Remote Date:  04/01/2010     Next Cardiology Appt Due:  09/07/2010 Tech Comments:  Device function normal.  Rate response blunted, slope reprogrammed.  The patient will start Merlin transmissions every 3 months with a return visit in October with Dr. Caryl Comes. 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 We have reprogrammed his rate response and had ambulated him with a  significant reduction in peak heart rate.; will see how this translates into functional status. I did also be prudent given his dyspnea if he doesn't resolve to reevaluate him with another Myoview scan His updated medication list for this problem includes:  Warfarin Sodium 5 Mg Tabs (Warfarin sodium) .Marland Kitchen... Take as directed    Coreg 25 Mg Tabs (Carvedilol) .Marland Kitchen... 1/2  by mouth two times a day    Ramipril 2.5 Mg Caps (Ramipril) .Marland Kitchen... 1 once daily  Problem # 2:  ATRIAL FIBRILLATION-PAROXYSMAL (ICD-427.31) no intercurrent atrial fibrillation His updated medication list for this problem includes:    Digoxin 0.125 Mg Tabs (Digoxin) .Marland Kitchen... Take one tablet daily    Warfarin Sodium 5 Mg Tabs (Warfarin sodium) .Marland Kitchen... Take as directed    Coreg 25 Mg Tabs (Carvedilol) .Marland Kitchen... 1/2  by mouth two times a day  Problem # 3:  CARDIOMYOPATHY, ISCHEMIC (ICD-414.8) his status is stable as best as I can tell unless the dyspnea is a manifestation of an anginal equivalent. It may be appropriate to consider reevaluation of his coronary perfusion. His updated medication list for this problem includes:    Digoxin 0.125 Mg Tabs (Digoxin) .Marland Kitchen... Take one tablet daily    Furosemide 40 Mg Tabs (Furosemide) .Marland Kitchen... Take one tablet twice daily    Spironolactone 25 Mg Tabs (Spironolactone) .Marland Kitchen... Take 1/2  tablet daily    Warfarin Sodium 5 Mg Tabs (Warfarin sodium) .Marland Kitchen... Take as directed    Coreg 25 Mg Tabs (Carvedilol) .Marland Kitchen... 1/2  by mouth two times a day    Ramipril 2.5 Mg Caps (Ramipril) .Marland Kitchen... 1 once daily  Problem # 4:  CONGESTIVE HEART FAILURE (ICD-428.0) I think this is likely related to exuberant heart rate response. However, I am not sure about this as noted above. We'll have to see how he does with extra 4 weeks. His updated medication list for this problem includes:    Digoxin 0.125 Mg Tabs (Digoxin) .Marland Kitchen... Take one tablet daily    Furosemide 40 Mg Tabs (Furosemide) .Marland Kitchen... Take one tablet twice daily    Spironolactone 25  Mg Tabs (Spironolactone) .Marland Kitchen... Take 1/2  tablet daily    Warfarin Sodium 5 Mg Tabs (Warfarin sodium) .Marland Kitchen... Take as directed    Coreg 25 Mg Tabs (Carvedilol) .Marland Kitchen... 1/2  by mouth two times a day    Ramipril 2.5 Mg Caps (Ramipril) .Marland Kitchen... 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 We were going to decrease his medications a cutting his Aldactone and his carvedilol and half.  Problem # 6:  IMPLANTATION OF DEFIBRILLATOR,Mason Arnold (ICD-V45.02) Device parameters and data were reviewed and no changes were made   We did a series of modified exercise test today trying to create an appropriate heart rate response to activity. As noted previously there was an exuberant response initially. We reprogrammed the slope as well as the threshold and when he walked the last time exercise performance was considerably improved.  He wi'll let us know in 3 or 4 weeks how he is doing with a modified programming  Patient Instructions: 1)  Your physician has recommended you make the following change in your medication: DECREASE SPIRONOLACTONE TO 12.5MG  DAILY (1/2 TABLET) 2)  DECREASE COREG TO 12.5MG  TWICE DAILY (1/2 TABLET TWICE DAILY) 3)  CALL MELANIE, RN IN 2-3 WEEKS TO LET ME KNOW IF THE CHANGES WE MADE TO YOUR DEVICE IS WORKING. IF NOT WE WILL PLAN TO DO MORE TESTING AT THAT TIME.  4)  Your physician recommends that you schedule a follow-up appointment in: 10/11  Appended Document: Mason Arnold F/U will be an ECHO and Nuc Stress if not feeling better per Dr. Caryl Comes

## 2011-01-09 NOTE — Assessment & Plan Note (Signed)
Summary: dst. jude/saf   Visit Type:  Pacemaker check Primary Provider:  Dr. Linna Darner  CC:  no complaints and Pt stated all Rx's the same no med list..  History of Present Illness:  Mason Arnold is seen in followup for  ischemic cardiomyopathy status post coronary bypass graft,    His last Myoview was performed on September 22, 2007.  At that time, he had an ejection fraction of 27%. There was a previous infarct involving the anterior wall, apex, and inferior wall, but there was no ischemia noted.    he is status post ICD implantation; he underwent generator replacement in October 2010.  His complaint today is of shortness of breath. 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) 3)  Hypoglycemia, Hx of  (ICD-V12.2) 4)  Preventive Health Care  (ICD-V70.0) 5)  Implantation of Defibrillator,st Jude  (ICD-V45.02) 6)  Orthostatic Hypotension  (ICD-458.0) 7)  Sinus Node Dysfunction/chronotropic Incompetence  (ICD-427.81) 8)  Atrial Fibrillation-paroxysmal  (ICD-427.31) 9)  Coumadin Therapy  (ICD-V58.61) 10)  Cardiomyopathy, Ischemic  (ICD-414.8) 11)  Other Spec Forms Chronic Ischemic Heart Disease  (ICD-414.8) 12)  Hyperuricemia, Asymptomatic  (ICD-790.6) 13)  Hyperlipidemia  (ICD-272.2) 14)  Unspecified Essential Hypertension  (ICD-401.9) 15)  Diabetes Mellitus, Type II, Controlled  ([id number]) 16)  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 Take One Tablet Daily 2)  Furosemide 40 Mg  Tabs (Furosemide) .... Take One Tablet Twice Daily 3)  Spironolactone 25 Mg  Tabs (Spironolactone) .... Take 1/2  Tablet Daily 4)  Calcium 600 Mg  Tabs (Calcium) .Marland Kitchen.. 1 Tab By Mouth Once Daily 5)  Folic  Acid A999333 Mcg  Tabs (Folic Acid) .Marland Kitchen.. 1 Tab By Mouth Once Daily 6)  Warfarin Sodium 5 Mg  Tabs (Warfarin Sodium) .... Take As Directed 7)  Fluoxetine Hcl 10 Mg  Caps (Fluoxetine Hcl) .Marland Kitchen.. 1 By Mouth Qd 8)  Simvastatin 20 Mg Tabs (Simvastatin) .Marland Kitchen.. 1 By Mouth Once Daily 9)  Pepcid .Marland Kitchen.. 1 Tab By Mouth Once Daily 10)  Multivitamins   Tabs (Multiple Vitamin) .Marland Kitchen.. 1 Tab By Mouth Once Daily 11)  Acetaminophen 500 Mg  Caps (Acetaminophen) .... As Needed 12)  Claritin 10 Mg Tabs (Loratadine) .... As Needed 13)  Coreg 25 Mg  Tabs (Carvedilol) .... 1/2  By Mouth Two Times A Day 14)  Lovaza 1 Gm  Caps (Omega-3-Acid Ethyl Esters) .... 2 Tabs By Mouth Two Times A Day 15)  Niacin Cr 750 Mg Cr-Tabs (Niacin) .... 2  Tab By Mouth At Bedtime 16)  Lantus 100 Unit/ml Soln (Insulin Glargine) .Marland Kitchen.. 105 Units At Bedtime 17)  Ramipril 2.5 Mg Caps (Ramipril) .Marland Kitchen.. 1 Once Daily 18)  Aspirin 81 Mg Tbec (Aspirin) .... Take One Tablet By Mouth Daily 19)  Allopurinol 100 Mg Tabs (Allopurinol) .Marland Kitchen.. 1 By Mouth Once Daily, Check Pt/inr 5-7 Days After Starting Med  Allergies (verified): 1)  ! * Rosuvastatin 2)  ! * Ilosone 3)  ! Ampicillin 4)  ! Darvocet  Past History:  Past Medical History: Last updated: 09/30/2010 Diabetes mellitus, type II Gout Myocardial infarction,PMH  of 2000 Ventricular tachycardia Atrial fibrillation, paroxysmal Ischemic heart disease Guidant ICD-vitality T125 2002-EXPLANTED 2010 St. 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 Jude UNIFY -09/2009  Family History: Last updated: 05/21/2010 maternal grandfather: MI @  39 maternal grandmother: MI @ 30 paternal grandfather: MI @ 66 father: MI, CHF @ 47 mother d 23  brothers: brain aneurysm; bro : high PSA.  No sibs have no  history of CAD, hypertension, hyperlipidemia, diabetes or stroke  Social History: Last updated: 05/21/2010 Never Smoked Retired  Married  Tobacco Use - No.  Regular Exercise - yes Alcohol use-yes  Risk Factors: Alcohol Use: 1 (05/21/2010) Caffeine Use: 3 cups/ day (05/21/2010) Diet: low salt, low sugar (05/21/2010) Exercise: yes (05/21/2010)  Risk Factors: Smoking Status: never (05/21/2010)  Vital Signs:  Patient profile:   70 year old male Height:      66 inches Weight:      186.75 pounds BMI:     30.25 Pulse rate:   79 / minute BP sitting:   106 / 76  (left arm) Cuff size:   regular  Vitals Entered By: Mason Arnold CMA (October 01, 2010 8:47 AM)  Physical Exam  General:  Well-developed well-nourished in no acute distress.  Skin is warm and dry.  HEENT is normal.  Neck is supple. No thyromegaly.  Chest is clear to auscultation with normal expansion.  Cardiovascular exam is regular rate and rhythm. 2/6 systolic ejection murmur. JVP is 6-7 cm Abdominal exam nontender or distended. No masses palpated. Extremities show trace  edema. neuro grossly intact     ICD Specifications Following MD:  Virl Axe, MD     Referring MD:  CRENSHAW ICD Vendor:  St Jude     ICD Model Number:  [id number]     ICD Serial Number:  [id number] ICD DOI:  09/19/2009     ICD Implanting MD:  Virl Axe, MD  Lead 1:    Location: [facility]     DOI: 04/19/2001     Model #: [id number]     Serial #: [id number]     Status: active Lead 2:    Location: [facility]     DOI: 04/19/2001     Model #: [id number]     Serial #[id number]     Status: active  Indications::  VT  Explantation Comments: 09-19-09 BSX T125 EXPLANTED  ICD Follow Up Battery Voltage:  86% V     Charge Time:  9.1 seconds     Battery Est. Longevity:  6.9-7.5 yrs Underlying rhythm:  A-PACED V-SR ICD  Dependent:  No       ICD Device Measurements Atrium:  Amplitude: 2.9 mV, Impedance: 490 ohms, Threshold: 0.75 V at 0.4 msec Right Ventricle:  Amplitude: 12.0 mV, Impedance: 690 ohms, Threshold: 1.25 V at 0.8 msec Shock Impedance: 44 ohms   Episodes MS Episodes:  3     Percent Mode Switch:  <1%     Coumadin:  Yes Shock:  0     ATP:  0     Nonsustained:  0     Atrial Therapies:  0 Atrial Pacing:  99%     Ventricular Pacing:  1.9%  Brady Parameters Mode DDDR     Lower Rate Limit:  60     Upper Rate Limit 110 PAV 275  Sensed AV Delay:  250 Rate Response Parameters:  slope to 8, reaction time to medium, threshold Auto (+0.0).  Tachy Zones VF:  240     VT:  210     VT1:  171     Next Remote Date:  01/02/2011     Next Cardiology Appt Due:  09/08/2011 Tech Comments:  3 AMS EPISODES--LONGEST WAS 10 SECONDS.  HISTOGRAMS FLAT--CHANGED SLOPE FROM 8 TO 10.  NORMAL DEVICE FUNCTION.  MERLIN 01-02-11 AND ROV IN 12 MTHS W/SK. Shelly Bombard  October 01, 2010 9:07 AM  Impression & Recommendations:  Problem # 1:  SYSTOLIC HEART FAILURE, ACUTE ON CHRONIC (ICD-428.23) the patient manifests recurrent acute exacerbations of his chronic systolic failure. I've asked him to increase his diuretic from 40/40-80/40 for one week area and doing well back to his baseline. In the event that he continues to struggle with episodic shortness of breath, I've asked her to follow up with Dr. Kirk Ruths from further medication adjustments. In the event that we cannot accomplish this, it would be reasonable to consider CRT upgrade. At the time of his last defibrillator generator replacement, [place] law does not permit upgrade from class II symptoms. He does however have a by the device with a left ventricular port plugged His updated medication list for this problem includes:    Digoxin 0.125 Mg Tabs (Digoxin) .Marland Kitchen... Take one tablet daily    Furosemide 40 Mg Tabs (Furosemide) .Marland Kitchen... Take one tablet twice daily     Spironolactone 25 Mg Tabs (Spironolactone) .Marland Kitchen... Take 1/2  tablet daily    Warfarin Sodium 5 Mg Tabs (Warfarin sodium) .Marland Kitchen... Take as directed    Coreg 25 Mg Tabs (Carvedilol) .Marland Kitchen... 1/2  by mouth two times a day    Ramipril 2.5 Mg Caps (Ramipril) .Marland Kitchen... 1 once daily    Aspirin 81 Mg Tbec (Aspirin) .Marland Kitchen... Take one tablet by mouth daily  Problem # 2:  CAD (ICD-414.00) Myoview as noted His updated medication list for this problem includes:    Warfarin Sodium 5 Mg Tabs (Warfarin sodium) .Marland Kitchen... Take as directed    Coreg 25 Mg Tabs (Carvedilol) .Marland Kitchen... 1/2  by mouth two times a day    Ramipril 2.5 Mg Caps (Ramipril) .Marland Kitchen... 1 once daily    Aspirin 81 Mg Tbec (Aspirin) .Marland Kitchen... 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 Take as directed    Coreg 25 Mg Tabs (Carvedilol) .Marland Kitchen... 1/2  by mouth two times a day    Ramipril 2.5 Mg Caps (Ramipril) .Marland Kitchen... 1 once daily    Aspirin 81 Mg Tbec (Aspirin) .Marland Kitchen... Take one tablet by mouth daily  Patient Instructions: 1)  Your physician recommends that you continue on your current medications as directed. Please refer to the Current Medication list given to you today. 2)  Your physician wants you to follow-up in: 1 year  You will receive a reminder letter in the mail two months in advance. If you don't receive a letter, please call our office to schedule the follow-up appointment.

## 2011-01-09 NOTE — Medication Information (Signed)
Summary: rov/tm  Anticoagulant Therapy  Managed by: Freddrick March, RN, BSN Referring MD: Kirk Ruths MD PCP: Dr. Katherene Ponto MD: Harrington Challenger MD, Nevin Bloodgood Indication 1: Atrial Fibrillation (ICD-427.31) Lab Used: [facility] Site: [facility] INR POC 2.1 INR RANGE 2 - 3  Dietary changes: no    Health status changes: no    Bleeding/hemorrhagic complications: no    Recent/future hospitalizations: no    Any changes in medication regimen? no    Recent/future dental: no  Any missed doses?: no       Is patient compliant with meds? yes       Allergies: 1)  ! * Rosuvastatin 2)  ! * Ilosone 3)  ! Ampicillin 4)  ! Darvocet  Anticoagulation Management History:      The patient is taking warfarin and comes in today for a routine follow up visit.  Positive risk factors for bleeding include an age of 70 years or older and presence of serious comorbidities.  Negative risk factors for bleeding include no history of CVA/TIA.  The bleeding index is 'intermediate risk'.  Positive CHADS2 values include History of CHF, History of HTN, and History of Diabetes.  Negative CHADS2 values include Age > 71 years old and Prior Stroke/CVA/TIA.  The start date was 01/03/2006.  His last INR was 2.0.  Anticoagulation responsible provider: Harrington Challenger MD, Nevin Bloodgood.  INR POC: 2.1.  Cuvette Lot#: [id number].  Exp: 01/2012.    Anticoagulation Management Assessment/Plan:      The patient's current anticoagulation dose is Warfarin sodium 5 mg  tabs: take as directed.  The target INR is 2 - 3.  The next INR is due 01/09/2011.  Anticoagulation instructions were given to patient.  Results were reviewed/authorized by Freddrick March, RN, BSN.  He was notified by Freddrick March RN.         Prior Anticoagulation Instructions: INR 3.0 Continue 5mg s everyday except 2.5 mg on Mondays, Wednesdays and Fridays. Recheck in 4 weeks.   Current Anticoagulation Instructions: INR 2.1  Continue on same dosage 5mg  daily except 2.5mg  on  Mondays, Wednesdays, and Fridays.  Recheck in 4 weeks.

## 2011-01-09 NOTE — Medication Information (Signed)
Summary: rov.mp  Anticoagulant Therapy  Managed by: Margaretha Sheffield, PharmD Referring MD: Kirk Ruths MD Supervising MD: Aundra Dubin MD, Dalton Indication 1: Atrial Fibrillation (ICD-427.31) Lab Used: [facility] Site: [facility] INR POC 2.7 INR RANGE 2 - 3  Dietary changes: no    Health status changes: no    Bleeding/hemorrhagic complications: no    Recent/future hospitalizations: no    Any changes in medication regimen? no    Recent/future dental: no  Any missed doses?: no       Is patient compliant with meds? yes       Allergies: 1)  ! * Rosuvastatin 2)  ! * Ilosone  Anticoagulation Management History:      The patient is taking warfarin and comes in today for a routine follow up visit.  Positive risk factors for bleeding include an age of 42 years or older and presence of serious comorbidities.  Negative risk factors for bleeding include no history of CVA/TIA.  The bleeding index is 'intermediate risk'.  Positive CHADS2 values include History of CHF, History of HTN, and History of Diabetes.  Negative CHADS2 values include Age > 47 years old and Prior Stroke/CVA/TIA.  The start date was 01/03/2006.  His last INR was 3.0 ratio.  Anticoagulation responsible provider: Aundra Dubin MD, Dalton.  INR POC: 2.7.  Cuvette Lot#: [id number].  Exp: 04/2011.    Anticoagulation Management Assessment/Plan:      The patient's current anticoagulation dose is Warfarin sodium 5 mg  tabs: take as directed.  The target INR is 2 - 3.  The next INR is due 04/02/2010.  Anticoagulation instructions were given to patient.  Results were reviewed/authorized by Margaretha Sheffield, PharmD.  He was notified by Margaretha Sheffield.         Prior Anticoagulation Instructions: INR 2.5  Continue 1 tab daily except 0.5 tab each Monday, Wednesday, Friday.  Recheck in 4 weeks.   Current Anticoagulation Instructions: INR 2.7  Continue taking 1/2 tablet on Monday, Wednesday, and Friday and take 1 tablet all other days.   Return to clinic in 4 weeks.

## 2011-01-09 NOTE — Cardiovascular Report (Signed)
Summary: Office Visit Remote   Office Visit Remote   Imported By: Sallee Provencal 07/29/2010 13:21:01  _____________________________________________________________________  External Attachment:    Type:   Image     Comment:   External Document

## 2011-01-09 NOTE — Miscellaneous (Signed)
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

## 2011-01-09 NOTE — Progress Notes (Signed)
Summary: Refill Request  Phone Note Refill Request Call back at Home Phone [phone] Message from:  Patient  Allopurinol , please send to [facility]./Chrae [facility] CMA  June 20, 2010 2:57 PM    Follow-up for Phone Call        I reviewed chart and we sent rx for allpurinol in the mail with last labwork #30/5refill. Patient should have enough refills until 10/2010.   I also noticed allpurinol was removed from med list by Dr.Crenshaw's office. Patient to call to futher discuss./Chrae [facility] CMA  June 20, 2010 2:59 PM   Additional Follow-up for Phone Call Additional follow up Details #1::        I spoke with patient, paitent stated that his wife opens the mail and he doesnt know what she done with rx, patient said it was removed by cardiologist cause when they asked if he was Cyndie Chime ghe said no b/c he had NOT received rx yet. I will send rx to the pharmacy Additional Follow-up by: Georgette Dover CMA,  June 21, 2010 11:46 AM    New/Updated Medications: ALLOPURINOL 100 MG TABS (ALLOPURINOL) 1 by mouth once daily, check PT/INR 5-7 days after starting med Prescriptions: ALLOPURINOL 100 MG TABS (ALLOPURINOL) 1 by mouth once daily, check PT/INR 5-7 days after starting med  #30 x 5   Entered by:   Victoria by:   Unice Cobble MD   Signed by:   Georgette Dover CMA on 06/21/2010   Method used:   Electronically to        [id number] (retail)       [address] [place], [place]  [address]       Ph: [phone]       Fax: [phone]   RxID:   [id number]

## 2011-01-09 NOTE — Letter (Signed)
Summary: Remote Device Check  [facility], [place]  [id number] [place]   [place], [place] [address]   Phone: [phone]  Fax: [phone]     April 05, 2010 MRN: [id number]   MANSON ZAHLER [place], [place]  [address]   Dear Mr. NOH,   Your remote transmission was recieved and reviewed by your physician.  All diagnostics were within normal limits for you.  __X___Your next transmission is scheduled for:   July 04, 2010.  Please transmit at any time this day.  If you have a wireless device your transmission will be sent automatically.     Sincerely,  Hotel manager

## 2011-01-09 NOTE — Medication Information (Signed)
Summary: rov/tm  Anticoagulant Therapy  Managed by: Tula Nakayama, RN, BSN Referring MD: Kirk Ruths MD PCP: Dr. Katherene Ponto MD: Haroldine Laws MD, Quillian Quince Indication 1: Atrial Fibrillation (ICD-427.31) Lab Used: [facility] Site: [facility] INR POC 3.0 INR RANGE 2 - 3  Dietary changes: yes       Details: Eating less leafy veggies  Health status changes: no    Bleeding/hemorrhagic complications: no    Recent/future hospitalizations: no    Any changes in medication regimen? no    Recent/future dental: no  Any missed doses?: no       Is patient compliant with meds? yes       Allergies: 1)  ! * Rosuvastatin 2)  ! * Ilosone 3)  ! Ampicillin 4)  ! Darvocet  Anticoagulation Management History:      The patient is taking warfarin and comes in today for a routine follow up visit.  Positive risk factors for bleeding include an age of 71 years or older and presence of serious comorbidities.  Negative risk factors for bleeding include no history of CVA/TIA.  The bleeding index is 'intermediate risk'.  Positive CHADS2 values include History of CHF, History of HTN, and History of Diabetes.  Negative CHADS2 values include Age > 53 years old and Prior Stroke/CVA/TIA.  The start date was 01/03/2006.  His last INR was 2.0.  Anticoagulation responsible provider: Bensimhon MD, Quillian Quince.  INR POC: 3.0.  Cuvette Lot#: [id number].  Exp: 01/2012.    Anticoagulation Management Assessment/Plan:      The patient's current anticoagulation dose is Warfarin sodium 5 mg  tabs: take as directed.  The target INR is 2 - 3.  The next INR is due 12/12/2010.  Anticoagulation instructions were given to patient.  Results were reviewed/authorized by Tula Nakayama, RN, BSN.  He was notified by Tula Nakayama, RN, BSN.         Prior Anticoagulation Instructions: INR 2.2 Continue 5mg s daily except 2.5mg s on Mondays, Wednesdays and Fridays. Recheck in 4 weeks.   Current Anticoagulation Instructions: INR  3.0 Continue 5mg s everyday except 2.5 mg on Mondays, Wednesdays and Fridays. Recheck in 4 weeks.

## 2011-01-09 NOTE — Letter (Signed)
Summary: Handout Printed  Printed Handout:  - Coumadin Instructions-w/out Meds 

## 2011-01-09 NOTE — Medication Information (Signed)
Summary: rov-tp  Anticoagulant Therapy  Managed by: Margaretha Sheffield, PharmD Referring MD: Kirk Ruths MD PCP: Dr. Katherene Ponto MD: Stanford Breed MD, Aaron Edelman Indication 1: Atrial Fibrillation (ICD-427.31) Lab Used: [facility] Site: [facility] INR POC 2.2 INR RANGE 2 - 3  Dietary changes: no    Health status changes: no    Bleeding/hemorrhagic complications: no    Recent/future hospitalizations: no    Any changes in medication regimen? no    Recent/future dental: no  Any missed doses?: no       Is patient compliant with meds? yes       Allergies: 1)  ! * Rosuvastatin 2)  ! * Ilosone 3)  ! Ampicillin 4)  ! Darvocet  Anticoagulation Management History:      The patient is taking warfarin and comes in today for a routine follow up visit.  Positive risk factors for bleeding include an age of 2 years or older and presence of serious comorbidities.  Negative risk factors for bleeding include no history of CVA/TIA.  The bleeding index is 'intermediate risk'.  Positive CHADS2 values include History of CHF, History of HTN, and History of Diabetes.  Negative CHADS2 values include Age > 23 years old and Prior Stroke/CVA/TIA.  The start date was 01/03/2006.  His last INR was 3.0 ratio.  Anticoagulation responsible Alden Feagan: Stanford Breed MD, Aaron Edelman.  INR POC: 2.2.  Cuvette Lot#: [id number].  Exp: 09/2011.    Anticoagulation Management Assessment/Plan:      The patient's current anticoagulation dose is Warfarin sodium 5 mg  tabs: take as directed.  The target INR is 2 - 3.  The next INR is due 09/12/2010.  Anticoagulation instructions were given to patient.  Results were reviewed/authorized by Margaretha Sheffield, PharmD.  He was notified by Sanda Linger.         Prior Anticoagulation Instructions: Continue 5mg  daily except 2.5mg  Mon, Wed, Fri.  Current Anticoagulation Instructions: INR 2.2  Continue taking one tablet every day except for one-half tablet on Monday, Wednesday, and Friday.   We will see you in 4 weeks.

## 2011-01-09 NOTE — Medication Information (Signed)
Summary: rov/cs  Anticoagulant Therapy  Managed by: Tula Nakayama, RN, BSN Referring MD: Kirk Ruths MD PCP: Dr. Katherene Ponto MD: Stanford Breed MD, Aaron Edelman Indication 1: Atrial Fibrillation (ICD-427.31) Lab Used: [facility] Site: [facility] INR POC 2.2 INR RANGE 2 - 3  Dietary changes: no    Health status changes: no    Bleeding/hemorrhagic complications: no    Recent/future hospitalizations: no    Any changes in medication regimen? no    Recent/future dental: no  Any missed doses?: no       Is patient compliant with meds? yes       Allergies: 1)  ! * Rosuvastatin 2)  ! * Ilosone 3)  ! Ampicillin 4)  ! Darvocet  Anticoagulation Management History:      The patient is taking warfarin and comes in today for a routine follow up visit.  Positive risk factors for bleeding include an age of 39 years or older and presence of serious comorbidities.  Negative risk factors for bleeding include no history of CVA/TIA.  The bleeding index is 'intermediate risk'.  Positive CHADS2 values include History of CHF, History of HTN, and History of Diabetes.  Negative CHADS2 values include Age > 56 years old and Prior Stroke/CVA/TIA.  The start date was 01/03/2006.  His last INR was 2.0.  Anticoagulation responsible provider: Stanford Breed MD, Aaron Edelman.  INR POC: 2.2.  Cuvette Lot#: [id number].  Exp: 10/2011.    Anticoagulation Management Assessment/Plan:      The patient's current anticoagulation dose is Warfarin sodium 5 mg  tabs: take as directed.  The target INR is 2 - 3.  The next INR is due 11/14/2010.  Anticoagulation instructions were given to patient.  Results were reviewed/authorized by Tula Nakayama, RN, BSN.  He was notified by Tula Nakayama, RN, BSN.         Prior Anticoagulation Instructions: INR 2.0  Continue Coumadin as scheduled:   1 tablet every day of the week, except 1/2 tablet on Monday, Wednesday, and Friday.   Return to clinic in 4 weeks.   Current Anticoagulation  Instructions: INR 2.2 Continue 5mg s daily except 2.5mg s on Mondays, Wednesdays and Fridays. Recheck in 4 weeks.

## 2011-01-09 NOTE — Medication Information (Signed)
Summary: rov/ewj  Anticoagulant Therapy  Managed by: Tula Nakayama, RN, BSN Referring MD: Kirk Ruths MD Supervising MD: Haroldine Laws MD, Quillian Quince Indication 1: Atrial Fibrillation (ICD-427.31) Lab Used: [facility] Site: [facility] INR POC 2.5 INR RANGE 2 - 3  Dietary changes: no    Health status changes: no    Bleeding/hemorrhagic complications: no    Recent/future hospitalizations: no    Any changes in medication regimen? yes       Details: Coreg and Spironolactone doses decreased  Recent/future dental: no  Any missed doses?: no       Is patient compliant with meds? yes       Allergies: 1)  ! * Rosuvastatin 2)  ! * Ilosone  Anticoagulation Management History:      The patient is taking warfarin and comes in today for a routine follow up visit.  Positive risk factors for bleeding include an age of 70 years or older and presence of serious comorbidities.  Negative risk factors for bleeding include no history of CVA/TIA.  The bleeding index is 'intermediate risk'.  Positive CHADS2 values include History of CHF, History of HTN, and History of Diabetes.  Negative CHADS2 values include Age > 29 years old and Prior Stroke/CVA/TIA.  The start date was 01/03/2006.  His last INR was 3.0 ratio.  Anticoagulation responsible provider: Bensimhon MD, Quillian Quince.  INR POC: 2.5.  Cuvette Lot#: [id number].  Exp: 03/2011.    Anticoagulation Management Assessment/Plan:      The patient's current anticoagulation dose is Warfarin sodium 5 mg  tabs: take as directed.  The target INR is 2 - 3.  The next INR is due 02/05/2010.  Anticoagulation instructions were given to patient.  Results were reviewed/authorized by Tula Nakayama, RN, BSN.  He was notified by Tula Nakayama, RN, BSN.         Prior Anticoagulation Instructions: INR 2.5  Continue on same dosage 1 tablet daily except 1/2 tablet on Mondays, Wednesdays, and Fridays.   Recheck in 4 weeks.    Current Anticoagulation Instructions: INR  2.5 Continue 5mg s everyday except 2.5mg s on Mondays, Wednesdays and Fridays. Recheck in 4 weeks.

## 2011-01-15 NOTE — Medication Information (Signed)
Summary: rov/ejm  Anticoagulant Therapy  Managed by: Alycia Rossetti, PharmD Referring MD: Kirk Ruths MD PCP: Dr. Katherene Ponto MD: Stanford Breed MD, Aaron Edelman Indication 1: Atrial Fibrillation (ICD-427.31) Lab Used: [facility] Site: [facility] INR POC 2.3 INR RANGE 2 - 3  Dietary changes: no    Health status changes: no    Bleeding/hemorrhagic complications: no    Recent/future hospitalizations: no    Any changes in medication regimen? no    Recent/future dental: no  Any missed doses?: no       Is patient compliant with meds? yes       Allergies: 1)  ! * Rosuvastatin 2)  ! * Ilosone 3)  ! Ampicillin 4)  ! Darvocet  Anticoagulation Management History:      Positive risk factors for bleeding include an age of 70 years or older and presence of serious comorbidities.  Negative risk factors for bleeding include no history of CVA/TIA.  The bleeding index is 'intermediate risk'.  Positive CHADS2 values include History of CHF, History of HTN, and History of Diabetes.  Negative CHADS2 values include Age > 61 years old and Prior Stroke/CVA/TIA.  The start date was 01/03/2006.  His last INR was 2.0.  Anticoagulation responsible provider: Stanford Breed MD, Aaron Edelman.  INR POC: 2.3.  Exp: 01/2012.    Anticoagulation Management Assessment/Plan:      The patient's current anticoagulation dose is Warfarin sodium 5 mg  tabs: take as directed.  The target INR is 2 - 3.  The next INR is due 02/06/2011.  Anticoagulation instructions were given to patient.  Results were reviewed/authorized by Alycia Rossetti, PharmD.         Prior Anticoagulation Instructions: INR 2.1  Continue on same dosage 5mg  daily except 2.5mg  on Mondays, Wednesdays, and Fridays.  Recheck in 4 weeks.    Current Anticoagulation Instructions: Continue taking coumadin as scheduled with 1 tablet (5 mg) daily except for 1/2 tablet (2.5 mg) on Mondays, Wednesdays, and Fridays.  INR 2.3

## 2011-01-27 ENCOUNTER — Encounter (INDEPENDENT_AMBULATORY_CARE_PROVIDER_SITE_OTHER): Payer: Self-pay | Admitting: *Deleted

## 2011-02-03 ENCOUNTER — Encounter: Payer: Self-pay | Admitting: Cardiology

## 2011-02-03 DIAGNOSIS — I4891 Unspecified atrial fibrillation: Secondary | ICD-10-CM | POA: Insufficient documentation

## 2011-02-04 NOTE — Cardiovascular Report (Signed)
Summary: Office Visit Remote   Office Visit Remote   Imported By: Sallee Provencal 01/31/2011 11:23:23  _____________________________________________________________________  External Attachment:    Type:   Image     Comment:   External Document

## 2011-02-04 NOTE — Letter (Signed)
Summary: Remote Device Check  [facility], [place]  [id number] [place]   [place], [place] [address]   Phone: [phone]  Fax: [phone]     January 27, 2011 MRN: [id number]   MICKEAL DIRI[place],   [address]   Dear Mr. RUSCHAK,   Your remote transmission was recieved and reviewed by your physician.  All diagnostics were within normal limits for you.  __X___Your next transmission is scheduled for:  04-03-2011.  Please transmit at any time this day.  If you have a wireless device your transmission will be sent automatically.   Sincerely,  Shelly Bombard

## 2011-02-06 ENCOUNTER — Encounter: Payer: Self-pay | Admitting: Internal Medicine

## 2011-02-06 ENCOUNTER — Encounter (INDEPENDENT_AMBULATORY_CARE_PROVIDER_SITE_OTHER): Payer: Medicare Other

## 2011-02-06 DIAGNOSIS — Z7901 Long term (current) use of anticoagulants: Secondary | ICD-10-CM

## 2011-02-06 DIAGNOSIS — I4891 Unspecified atrial fibrillation: Secondary | ICD-10-CM

## 2011-02-06 LAB — CONVERTED CEMR LAB: POC INR: 2.9

## 2011-02-13 NOTE — Medication Information (Signed)
Summary: r  Anticoagulant Therapy  Managed by: Ella Jubilee, PharmD Referring MD: Kirk Ruths MD PCP: Dr. Katherene Ponto MD: Harrington Challenger MD, Nevin Bloodgood Indication 1: Atrial Fibrillation (ICD-427.31) Lab Used: [facility] Site: [facility] INR POC 2.9 INR RANGE 2 - 3  Dietary changes: no    Health status changes: no    Bleeding/hemorrhagic complications: no    Recent/future hospitalizations: no    Any changes in medication regimen? no    Recent/future dental: no  Any missed doses?: no       Is patient compliant with meds? yes       Current Medications (verified): 1)  Digoxin 0.125 Mg  Tabs (Digoxin) .... Take One Tablet Daily 2)  Furosemide 40 Mg  Tabs (Furosemide) .... Take One Tablet Twice Daily 3)  Spironolactone 25 Mg  Tabs (Spironolactone) .... Take 1/2  Tablet Daily 4)  Calcium 600 Mg  Tabs (Calcium) .Marland Kitchen.. 1 Tab By Mouth Once Daily 5)  Folic Acid [id number] Mcg  Tabs (Folic Acid) .Marland Kitchen.. 1 Tab By Mouth Once Daily 6)  Warfarin Sodium 5 Mg  Tabs (Warfarin Sodium) .... Take As Directed 7)  Fluoxetine Hcl 10 Mg  Caps (Fluoxetine Hcl) .Marland Kitchen.. 1 By Mouth Qd 8)  Simvastatin 20 Mg Tabs (Simvastatin) .Marland Kitchen.. 1 By Mouth Once Daily 9)  Pepcid .Marland Kitchen.. 1 Tab By Mouth Once Daily 10)  Multivitamins   Tabs (Multiple Vitamin) .Marland Kitchen.. 1 Tab By Mouth Once Daily 11)  Acetaminophen 500 Mg  Caps (Acetaminophen) .... As Needed 12)  Claritin 10 Mg Tabs (Loratadine) .... As Needed 13)  Coreg 25 Mg  Tabs (Carvedilol) .... 1/2  By Mouth Two Times A Day 14)  Lovaza 1 Gm  Caps (Omega-3-Acid Ethyl Esters) .... 2 Tabs By Mouth Two Times A Day 15)  Niacin Cr 750 Mg Cr-Tabs (Niacin) .... 2  Tab By Mouth At Bedtime 16)  Lantus 100 Unit/ml Soln (Insulin Glargine) .Marland Kitchen.. 105 Units At Bedtime 17)  Ramipril 2.5 Mg Caps (Ramipril) .Marland Kitchen.. 1 Once Daily 18)  Aspirin 81 Mg Tbec (Aspirin) .... Take One Tablet By Mouth Daily 19)  Allopurinol 100 Mg Tabs (Allopurinol) .Marland Kitchen.. 1 By Mouth Once Daily, Check Pt/inr 5-7 Days After Starting  Med  Allergies (verified): 1)  ! * Rosuvastatin 2)  ! * Ilosone 3)  ! Ampicillin 4)  ! Darvocet  Anticoagulation Management History:      Positive risk factors for bleeding include an age of 34 years or older and presence of serious comorbidities.  Negative risk factors for bleeding include no history of CVA/TIA.  The bleeding index is 'intermediate risk'.  Positive CHADS2 values include History of CHF, History of HTN, and History of Diabetes.  Negative CHADS2 values include Age > 47 years old and Prior Stroke/CVA/TIA.  The start date was 01/03/2006.  His last INR was 2.0 and today's INR is 2.9.  Anticoagulation responsible provider: Harrington Challenger MD, Nevin Bloodgood.  INR POC: 2.9.  Exp: 01/2012.    Anticoagulation Management Assessment/Plan:      The patient's current anticoagulation dose is Warfarin sodium 5 mg  tabs: take as directed.  The target INR is 2 - 3.  The next INR is due 02/06/2011.  Anticoagulation instructions were given to patient.  Results were reviewed/authorized by Ella Jubilee, PharmD.         Prior Anticoagulation Instructions: Continue taking coumadin as scheduled with 1 tablet (5 mg) daily except for 1/2 tablet (2.5 mg) on Mondays, Wednesdays, and Fridays.  INR 2.3  Current Anticoagulation  Instructions: Cont with current regimen Return to clinic on 3/29 @ 0830

## 2011-03-06 ENCOUNTER — Ambulatory Visit (INDEPENDENT_AMBULATORY_CARE_PROVIDER_SITE_OTHER): Payer: Medicare Other | Admitting: *Deleted

## 2011-03-06 DIAGNOSIS — I4891 Unspecified atrial fibrillation: Secondary | ICD-10-CM

## 2011-03-06 DIAGNOSIS — Z7901 Long term (current) use of anticoagulants: Secondary | ICD-10-CM

## 2011-03-06 NOTE — Patient Instructions (Signed)
Continue on same dosage 1 tablet daily except 1/2 tablet on Mondays, Wednesdays, and Fridays.   Recheck in 4 weeks.

## 2011-03-13 LAB — GLUCOSE, CAPILLARY: Glucose-Capillary: 166 mg/dL — ABNORMAL HIGH (ref 70–99)

## 2011-04-03 ENCOUNTER — Ambulatory Visit (INDEPENDENT_AMBULATORY_CARE_PROVIDER_SITE_OTHER): Payer: Medicare Other | Admitting: *Deleted

## 2011-04-03 ENCOUNTER — Other Ambulatory Visit: Payer: Self-pay

## 2011-04-03 DIAGNOSIS — I4891 Unspecified atrial fibrillation: Secondary | ICD-10-CM

## 2011-04-03 DIAGNOSIS — I428 Other cardiomyopathies: Secondary | ICD-10-CM

## 2011-04-13 NOTE — Progress Notes (Signed)
icd remote check  

## 2011-04-16 ENCOUNTER — Encounter: Payer: Self-pay | Admitting: *Deleted

## 2011-04-22 NOTE — Assessment & Plan Note (Signed)
[facility]                            CARDIOLOGY OFFICE NOTE   Mason Arnold, Mason Arnold                     MRN:          [id number]  DATE:07/21/2007                            DOB:          1941/04/20    Mason Arnold is a very pleasant gentleman who has a history of coronary  artery disease, status post coronary bypass and graft.  His most recent  catheterization was performed in June 2006.  At that time, his grafts  were patent.  His ejection fraction was 15% to 20%.  He also has had a  previous ICD.  Since I last saw him he is doing well.  He has minimal  dyspnea on exertion, but there is no orthopnea, PND, pedal edema,  palpitations, presyncope, syncope or chest pain.  He occasionally has to  take an additional Lasix for volume overload.   MEDICATIONS:  1. Pepcid.  2. Metformin.  3. Fluoxetine.  4. Avapro 150 mg p.o. daily.  5. Coreg 12.5 mg p.o. b.i.d.  6. Digoxin 0.25 mg p.o. daily.  7. Lasix 40 mg p.o. b.i.d.  8. Spironolactone 25 mg p.o. daily.  9. Fish oil.  10.Multivitamin.  11.Calcium.  12.Folate.  13.Lecithin.  14.Glimepiride.  15.Vytorin 10/80 daily.  16.Coumadin.  17.Aspirin 81 mg p.o. daily.   PHYSICAL EXAMINATION:  Blood pressure 94/64 and his pulse is 64.  He  weighs 184 pounds.  HEENT:  Normal.  NECK:  Supple.  CHEST:  Clear.  CARDIOVASCULAR:  Exam reveals a regular rate.  ABDOMEN:  Exam is benign.  EXTREMITIES:  Show no edema.   ELECTROCARDIOGRAM:  Atrial paced rhythm at a rate of 60.  There is left  ventricular hypertrophy and an incomplete left bundle branch block.   DIAGNOSES:  1. Coronary artery disease -- the patient is doing well from a      symptomatic standpoint.  His catheterization in June 2006 showed      patent grafts.  We will continue with medical therapy including his      angiotensin II receptor blocker, beta blocker, aspirin and statin.  2. Ischemic cardiomyopathy -- as per #1.  3. Status post  implantable cardioverter-defibrillator -- he is      followed by Dr. Caryl Comes.  4. History of atrial fibrillation -- he will continue on Coumadin with      goal INR of 2-3 and this is being followed in our Coumadin clinic.      His recent hemoglobin was normal.  He is in an atrial-paced rhythm      today.  5. Coumadin therapy.  6. History of cough with ACE inhibitors.  7. Diabetes mellitus.  8. Hypertension -- his blood pressure is well-controlled.  9. Hyperlipidemia -- his last lipids were outstanding.   He will continue with risk factor modification and we will see him back  in 8 months.     Denice Bors Stanford Breed, MD, [facility]  Electronically Signed    BSC/MedQ  DD: 07/21/2007  DT: 07/22/2007  Job #: [id number]

## 2011-04-22 NOTE — Assessment & Plan Note (Signed)
[facility]                         ELECTROPHYSIOLOGY OFFICE NOTE   Mason Arnold, Mason Arnold                     MRN:          [id number]  DATE:01/02/2009                            DOB:          December 19, 1940    Mason Arnold is seen in followup for atrial fibrillation and  ventricular tachycardia, occurring in the setting of ischemic heart  disease with prior bypass surgery.  Ejection fraction estimated at 25%.  He has no complaints of chest pain or shortness of breath at this point.  He has had no episodes of lightheadedness.  This has been resolved since  his doses were down titrated last time.   CURRENT MEDICATIONS:  1. Benicar at 5.  2. Lantus, recently initiated.  3. Warfarin.  4. Digoxin 0.125.  5. Coreg 25 twice daily.  6. Simvastatin.  7. Aspirin.   PHYSICAL EXAMINATION:  VITAL SIGNS:  Today his blood pressure is 102/66  with a pulse of 64.  His weight is 183 which is up 5 pounds over the  last 6 months.  LUNGS:  Clear.  HEART:  Sounds were regular.  ABDOMEN:  Soft.  EXTREMITIES:  No edema.   Interrogation of his Guidant Vitality ICD demonstrates no intrinsic  atrial rhythm.  The impedance was 567.  The threshold was 1 volt at 0.4.  The R-waves are greater than 25.  The impedance was 907.  The threshold  was 1 volt at 0.5.  Battery voltage was 2.59.   IMPRESSION:  1. Ventricular tachycardia - quiescent.  2. Atrial fibrillation - paroxysmal.  3. Ischemic heart disease.      a.     Prior bypass.      b.     Ejection fraction of 15-20%.  4. Status post Guidant dual-mode, dual-pacing, dual-sensing      implantable cardioverter-defibrillator.  5. Congestive heart failure - class II - chronic - systolic.  6. Left bundle-branch block.  7. Diabetes.   Mason Arnold remained stable, at this point functionally.  Notwithstanding his left bundle-branch block.  As his device gets ready  to be changed, it might be reasonable to think about  low LV lead  placement depending on where we are with approval of those things.   We will plan to check his INR today.     Deboraha Sprang, MD, [facility]  Electronically Signed    SCK/MedQ  DD: 01/02/2009  DT: 01/02/2009  Job #: [id number]

## 2011-04-22 NOTE — Consult Note (Signed)
NAMEHERSHAL, MACHON            ACCOUNT NO.:  [id number]   MEDICAL RECORD NO.:  [id number]          PATIENT TYPE:  EMS   LOCATION:  MAJO                         FACILITY:  [facility]   PHYSICIAN:  Denice Bors. Stanford Breed, MD, FACCDATE OF BIRTH:  July 20, 1941   DATE OF CONSULTATION:  DATE OF DISCHARGE:                                 CONSULTATION   Mason Arnold is a 70 year old gentleman with a past medical history of  coronary artery disease status post coronary artery bypass graft,  ischemic cardiomyopathy, history of ICD, diabetes mellitus,  hypertension, hyperlipidemia, paroxysmal atrial fibrillation, who  presents with left upper extremity, back and chest pain.  The patient's  cardiac history dates back to October of 2000 when he had coronary  artery bypass graft with a LIMA to the LAD, saphenous vein graft to the  ramus, saphenous vein graft to the obtuse marginal and a saphenous vein  graft to the PDA.  This occurred in the setting of an acute myocardial  infarction.  His most recent cardiac catheterization was in June of 2006  showing patent grafts and his ejection fraction was in the 15 to 20%  range.  He also has a history of ICD.  He typically does not have  significant dyspnea on exertion although his wife does state that she  notices some dyspnea.  There is no orthopnea or PND and no recent pedal  edema.  He does not have exertional chest pain.  This past weekend, he  went to [place] to play golf at a reunion.  Beginning Saturday, the  patient noticed a burning-type pain in his back and also in his left  shoulder and left upper extremity.  This typically is not exertional.  It is not pleuritic or positional although it can improve with raising  his hands above his head.  There is no associated nausea or vomiting,  shortness of breath or diaphoresis.  It typically lasts 20 minutes and  resolves spontaneously.  The pain has continued on-and-off over the  weekend.  He has taken Motrin  and Tylenol, but this does not seem to  improve his symptoms.  Today, he had an episode that radiated to his  chest.  He called the office and was asked to come to the emergency  room.   His medications include:  1. Avapro 150 mg p.o. daily.  2. Coreg 12.5 mg p.o. b.i.d.  3. Digoxin 0.125 mg p.o. daily.  4. Lasix 40 mg p.o. b.i.d.  5. Spironolactone 25 mg p.o. once a day.  6. Pepcid.  7. Fish oil 1 gm p.o. t.i.d.  8. Multivitamin.  9. Calcium.  10.Folate.  11.Lecithin.  12.Aspirin 81 mg p.o. once a day.  13.Tylenol.  14.Metformin 500 mg p.o. t.i.d.  15.Fluoxetine 10 mg p.o. daily.  16.Coumadin as directed.  17.Vytorin 10/80 once a day.  18.Glimepiride 4 mg p.o. daily.  19.Claritin as needed.   HE HAS AN ALLERGY TO ROSUVASTATIN.   SOCIAL HISTORY:  He does not smoke.  He only occasionally consumes  alcohol.   FAMILY HISTORY:  Positive for coronary artery disease in his father.  PAST MEDICAL HISTORY:  Significant for:  1. Diabetes mellitus.  2. Hypertension.  3. Hyperlipidemia.  4. He has a history of coronary artery disease as well as an ischemic      cardiomyopathy as described in the HPI.  5. He also has a history of an ICD.  6. He has a history of paroxysmal atrial fibrillation.  7. He has had a previous hernia repair.  8. He has also had umbilical surgery.   REVIEW OF SYSTEMS:  He denies any headaches or fevers or chills.  There  is no productive cough or hemoptysis.  There is no dysphagia,  odynophagia, melena or hematochezia.  There is no dysuria or hematuria.  There is no rash or seizure activity.  There is no orthopnea, PND or  pedal edema.  The remaining systems are negative.   PHYSICAL EXAMINATION:  VITAL SIGNS:  Physical exam today shows a blood  pressure in the right arm of 128/78 and the left arm is 119/70.  His  pulse is 59.  GENERAL:  He is well developed and well nourished in no  acute distress.  His skin is warm and dry.  He does not appear to be   depressed.  There is no peripheral clubbing.  His back is normal.  HEENT:  Normal with normal eyelids.  NECK:  Supple with normal upstroke bilaterally, no bruits noted.  There  is no jugular venous distention and no thyromegaly is noted.  CHEST:  Clear to auscultation with normal expansion.  CARDIOVASCULAR EXAM:  Regular rate and rhythm, normal S1 and S2.  There  is a 2/6 systolic ejection murmur at the left sternal border.  There is  no S3 or S4.  ABDOMINAL EXAM:  Nontender, nondistended.  Positive bowel sounds.  No  hepatosplenomegaly.  No mass appreciated.  There is no abdominal bruit.  He has 2+ femoral pulses bilaterally, no bruits.  EXTREMITIES:  Show no edema that I can palpate, no cords.  He has 2+  posterior tibial pulses bilaterally.  NEUROLOGICAL EXAM:  Grossly intact.   An electrocardiogram shows atrial pacing at a rate of 59.  There is a  right bundle-branch block with a left anterior vesicular block.  A prior  septal infarct could not be excluded.  There is left ventricular  hypertrophy with QRS widening.   DIAGNOSES:  Atypical chest pain - Mason Arnold's symptoms are atypical  in description and he states they are not like his infarct pain.  I  wonder if they may be musculoskeletal.  Note he does have normal blood  pressures in both arms and his pain is not pleuritic.   We will admit and rule out myocardial infarction with serial enzymes.  If they are negative, we will plan an outpatient Myoview tomorrow for  risk stratification.  In the meantime, he will continue on his aspirin,  beta-blocker, ARB and statin.   Dictation ended at this point.      Denice Bors Stanford Breed, MD, [facility]  Electronically Signed     BSC/MEDQ  D:  09/20/2007  T:  09/20/2007  Job:  [id number]

## 2011-04-22 NOTE — Assessment & Plan Note (Signed)
[facility]                         ELECTROPHYSIOLOGY OFFICE NOTE   Mason Arnold, Mason Arnold                     MRN:          [id number]  DATE:08/25/2007                            DOB:          1941-05-14    Mason Arnold is seen. He is status post ICD implantation in the setting  of ischemic heart disease. He also has atrial fibrillation. He has had  recurrent ventricular tachycardia but recently has been quiescent. He  has mild shortness of breath with over exertion with bending. He has  intermittent orthopnea and he modifies his Lasix doses as needed.   He has had no problems with chest pain.   MEDICATIONS:  Reviewed, are legion and are not changed since Dr.  Jacalyn Lefevre note of August 13.   PHYSICAL EXAMINATION:  VITAL SIGNS:  On examination today, his blood  pressure was somewhat better at 109/70, pulse of 68, weight 178 which is  down a couple of pounds and down 7 pounds since last January.  LUNGS:  Clear.  HEART:  Heart sounds were regular.  EXTREMITIES:  Without edema.  ABDOMEN:  Soft and active.   Interrogation of his Guidant Vitality ICD demonstrates a P wave of 3.9  with impedance of 553,  a threshold of 0.8 at 0.5. The R wave is greater  than 25 with impedance of 834, threshold 0.8 at 0.5. There are no  intercurrent therapies. Battery voltage is 2.84. There were a number of  atrial high rate episodes.   IMPRESSION:  1. Ischemic heart disease.      a.     Depressed left ventricular function.      b.     Prior bypass surgery.  2. Ejection fraction 15-20%.  3. Previously implanted ICD - dual-chamber.  4. Ventricular tachycardia with appropriate therapy but none recently.  5. Congestive heart failure - class 2 - systolic - chronic.  6. Diabetes.   [name] is doing really quite well notwithstanding his left bundle  branch block. He remains functional class 2 and at this point would not  do anything further with his device. In  the  event that his situation deteriorates, he would be a candidate for CRT  upgrade. We will see him again in 1 year's time.     Deboraha Sprang, MD, [facility]  Electronically Signed    SCK/MedQ  DD: 08/25/2007  DT: 08/26/2007  Job #: [id number]

## 2011-04-22 NOTE — Assessment & Plan Note (Signed)
[facility]                            CARDIOLOGY OFFICE NOTE   AVAAN, VINK                     MRN:          [id number]  DATE:10/19/2007                            DOB:          [birth year]    HISTORY:  Mr. Bordonaro is a pleasant gentleman who I follow for  coronary artery disease, status post coronary artery bypass graft and  ischemic cardiomyopathy.  He was recently admitted to [facility] with atypical chest pain on September 20, 2007.  He ruled out for  myocardial infarction with serial enzymes.  He subsequently had an  outpatient Myoview performed on September 22, 2007.  There was a scar in  the anterior, apex and inferior wall.  There was no ischemia noted.  His  ejection fraction was 27%.  Since that time, he has done well.  There is  no dyspnea on exertion, orthopnea, PND, pedal edema, palpitations,  syncope or chest pain.  Note by mistake, he had been taking Coreg 25 mg  p.o. b.i.d.  He did tolerate this well.  He occasionally feels some  lightheadedness with standing, but this does not appear to be a  significant problem.   CURRENT MEDICATIONS:  1. Pepcid.  2. Metformin 500 mg p.o. t.i.d.  3. Fluoxetine 10 mg p.o. daily.  4. Zocor 80 mg p.o. daily.  5. Avapro 150 mg p.o. daily.  6. Coreg 25 mg p.o. b.i.d.  7. Digoxin 0.125 mg p.o. daily.  8. Lasix 40 mg p.o. b.i.d.  9. Spironolactone 25 mg p.o. daily.  10.Fish oil.  11.Multivitamin.  12.Calcium.  13.Folate.  14.Lecithin.  15.Glimepiride.  16.Coumadin.  17.Aspirin 81 mg p.o. daily.   PHYSICAL EXAMINATION:  VITAL SIGNS:  Blood pressure 112/70, pulse 75.  He weighs 180 pounds.  HEENT:  Normal.  NECK:  Supple.  CHEST:  Clear.  CARDIOVASCULAR:  Regular rate and rhythm.  ABDOMEN:  Shows no tenderness.  EXTREMITIES:  No edema.   DIAGNOSES:  1. Coronary artery disease.  The patient has had no further chest      pain.  His Myoview showed infarct, but no ischemia.   We will      continue his medical therapy including his ARB, statin and Coreg.      Note, he is taking Coreg 25 mg p.o. b.i.d.  Given that he is      tolerating this, from a symptomatic and blood pressure standpoint,      we will continue with that dose.  2. Ischemic cardiomyopathy.  As per number 1.  3. Status post implantable cardioverter-defibrillator.  This is being      followed by Dr. Caryl Comes.  4. History of atrial fibrillation.  He will continue on the Coreg and      digoxin.  He will continue on his Coumadin with goal INR being 2-3.  5. Coumadin therapy.  6. History of cough with ACE inhibition.  7. Diabetes mellitus, per his primary care physician.  8. Hypertension, his blood pressure is adequately controlled.  9. Hyperlipidemia, he will continue on his statin.   FOLLOW UP:  We  will see him back in 8 months.     Denice Bors Stanford Breed, MD, [facility]  Electronically Signed    BSC/MedQ  DD: 10/19/2007  DT: 10/20/2007  Job #: [id number]

## 2011-04-22 NOTE — Assessment & Plan Note (Signed)
[place]                            CARDIOLOGY OFFICE NOTE   RIXON, DONAGHEY                     MRN:          [id number]  DATE:07/05/2008                            DOB:          1941/03/06    Mason Arnold is a pleasant gentleman who has a history of coronary  artery disease status post coronary bypass graft, as well as an ischemic  cardiomyopathy and hyperlipidemia.  His last Myoview was performed on  September 22, 2007.  At that time, he had an ejection fraction of 27%.  There was a previous infarct involving the anterior wall, apex, and  inferior wall, but there was no ischemia noted.  Since I last saw him,  he is doing reasonably well from a symptomatic standpoint.  He does have  some dyspnea with exertion related to the heat.  However, there is no  orthopnea, PND, pedal edema, palpitations, presyncope, syncope, or chest  pain.   MEDICATIONS:  1. Pepcid AC.  2. Zocor 80 mg p.o. daily.  3. Benicar 20 mg p.o. daily.  4. Coreg 25 mg p.o. b.i.d.  5. Lasix 40 mg p.o. b.i.d.  6. Metformin 1000 mg p.o. b.i.d.  7. Fluoxetine 10 mg p.o. daily.  8. Lovaza.  9. Digoxin 0.25 mg p.o. daily.  10.Spironolactone 25 mg p.o. daily.  11.Multivitamin daily.  12.Calcium  13.Folate.  14.Lecithin.  15.Glimepiride 4 mg p.o. daily.  16.Coumadin as directed.  17.Aspirin 81 mg p.o. daily.   PHYSICAL EXAMINATION:  VITAL SIGNS:  Today shows a blood pressure of  100/67 and his pulse is 60.  Weight is 178 pounds.  HEENT:  Normal.  NECK:  Supple.  CHEST:  Clear.  CARDIOVASCULAR:  Regular rate and rhythm.  ABDOMEN:  No tenderness.  EXTREMITIES:  No edema.   His electrocardiogram shows an atrial-paced rhythm.  There is evidence  of a prior anterior infarct.   DIAGNOSES:  1. Coronary artery disease status post coronary artery bypass graft -      Mason Arnold has had no chest pain and his Myoview back in October      showed no ischemia.  We will  continue with medical therapy to      include his aspirin, ARB, statin and beta-blocker.  2. Ischemic cardiomyopathy - As per #1.  3. History of implantable cardioverter-defibrillator.  4. History of atrial fibrillation - He remains in an atrial-paced      rhythm today.  He will continue on Coreg and digoxin as well as      Coumadin with goal INR of 2-3.  5. Coumadin therapy.  6. History of cough with ACE inhibition.  7. Diabetes mellitus.  8. Hypertension - His blood pressure is adequately controlled.  He had      a recent BMET checked and his potassium was 3.9.  9. Hyperlipidemia - His most recent LDL was 59.  He will continue on      his present dose of Zocor.   We will see him back in 9 months.     Denice Bors Stanford Breed, MD, [facility]  Electronically Signed  BSC/MedQ  DD: 07/05/2008  DT: 07/06/2008  Job #: [id number]

## 2011-04-22 NOTE — Assessment & Plan Note (Signed)
[place] OFFICE NOTE   TIARNAN, OSTERMEYER                     MRN:          [id number]  DATE:08/15/2008                            DOB:          1941/06/24    Mr. Vantrease is seen in followup for an ICD implanted for primary  prevention.  He has had appropriate VT therapy but this has been  quiescent.  His major complaint now of lightheadedness and recorded  blood pressures at home of about 80 or so.   His medications include warfarin, glimepiride, spironolactone 25,  furosemide 40 b.i.d., Benicar 20, and Coreg 25 b.i.d.  In addition,  Pepcid, simvastatin, and Lanoxin 0.125.   PHYSICAL EXAMINATION:  VITAL SIGNS:  His blood pressure right now is  101/69 with a pulse of 77.  LUNGS:  Clear.  HEART:  Sounds were regular.  ABDOMEN:  Soft.  EXTREMITIES:  Without edema.   Interrogation of his Guidant device demonstrates no intrinsic atrial  rhythm with a threshold 0.8 at 0.5 and impedance of 541.  The R-wave was  25 with impedance of 923 and threshold of 0.6 at 0.5.  The high voltage  impedance was 43 ohms.   IMPRESSION:  1. Ischemic heart disease.      a.     Depressed left ventricular function.      b.     Prior bypass.      c.     Ejection fraction of 15-20%.  2. Previously implanted dual-chamber implantable cardioverter-      defibrillator.  3. VT with appropriate therapy recently.  4. Chronic congestive heart failure - class II.  5. Lightheadedness associated with hypotension.   I have asked Mr. Gillyard to decrease his Benicar for 20-10.  In the  event that his symptoms and hypotension persists, I have asked him then  to decrease his Coreg from 25 b.i.d. to 12.5 b.i.d.   He is to take Lasix intermittently, but he notes no association between  his relative hypotension and doses of Lasix.   I have asked him to schedule a follow up with me in 4 months.  He is to  see Dr. Stanford Breed in  approximately 8 months then maybe Dr. Forrestine Him and  I can split him up and see him each yearly and 6 months offset.     Deboraha Sprang, MD, [facility]  Electronically Signed    SCK/MedQ  DD: 08/15/2008  DT: 08/16/2008  Job #: [id number]

## 2011-04-22 NOTE — Discharge Summary (Signed)
Mason Arnold            ACCOUNT NO.:  [id number]   MEDICAL RECORD NO.:  [id number]          PATIENT TYPE:  OBV   LOCATION:  6523                         FACILITY:  [facility]   PHYSICIAN:  Mason Bors. Stanford Breed, Arnold, FACCDATE OF BIRTH:  [birth year]   DATE OF ADMISSION:  09/20/2007  DATE OF DISCHARGE:  09/21/2007                               DISCHARGE SUMMARY   PRIMARY CARDIOLOGIST:  Mason Bors. Stanford Breed, Arnold, [facility].   PRIMARY ELECTROPHYSIOLOGIST:  Mason Sprang, Arnold, [facility].   PRIMARY CARE Mason Arnold:  Mason Arnold,FACP,FCCP.   DISCHARGE DIAGNOSIS:  Chest pain.   SECONDARY DIAGNOSES:  1. Coronary artery disease status post coronary artery bypass grafting      x4 October 2001 with patent grafts on last catheterization in June      2006.  2. Ischemic cardiomyopathy with an ejection fraction of 15-20% status      post BiV implantable cardioverter-defibrillator May 2005.  3. History of nonsustained ventricular tachycardia.  4. Chronic systolic congestive heart failure.  5. Hypertension.  6. Hyperlipidemia.  7. Type 2 diabetes mellitus.  8. Paroxysmal atrial fibrillation on chronic Coumadin.   ALLERGIES:  ROSUVASTATIN.   PROCEDURES:  None   HISTORY OF PRESENT ILLNESS:  This is a 70 year old married Caucasian  male with the above problem list who was in his usual state of health  until October 11 when he noticed a burning type pain in his back, left  shoulder, and left upper extremity.  Pain occurred at rest and was not  worse with exertion.  There was no pleuritic or positional component to  it.  There were no associated symptoms, and symptoms would typically  last 20 minutes or so and resolve spontaneously.  The discomfort  continued on and off over the weekend and was unimproved by either  Tylenol or NSAIDs.  Because of ongoing symptoms, he called into our  office on Monday, October 13, and was advised to present to [facility]  ED.   In the ED, he had some recurrence of  discomfort without ECG changes and  otherwise appeared comfortable.  Blood pressures were equal on both  arms, and chest x-ray showed no acute findings.  He was placed in  observation for rule out.   HOSPITAL COURSE:  [name] ruled out for MI by cardiac markers, and  ECG has remained normal.  He had no additional chest or back pain but  has had neck pain while sitting up.  He will be discharged home today in  satisfactory condition.  We have arranged for an outpatient exercise  Myoview tomorrow, October 15, in our office.  We have recommended if  symptoms persist, he should follow up with primary care for possible C-  spine x-rays.  He will follow with Dr. Stanford Arnold in 4 weeks.   DISCHARGE LABORATORY DATA:  Hemoglobin 13.5, hematocrit 39.5, WBC 8.8,  platelets 201. INR 2.9.  Sodium 138, potassium 4.1, chloride 99, CO2 30,  BUN 17, creatinine 1.0, glucose 114. Total bilirubin 1.0, alkaline  phosphatase 37, AST 26, ALT 32, total protein 6.8, albumin 4.1, calcium  9.1. CK  165, MB 2.4, troponin I 0.01. Total cholesterol 141,  triglycerides 301, HDL 27, LDL 54.  TSH 0.450.   DISPOSITION:  The patient will be discharged home today in good  condition.   FOLLOWUP PLANS AND APPOINTMENTS:  1. He will undergo exercise Myoview stress testing on October 15 at      8:45 a.m..  2. He has followup with Dr. Stanford Arnold on November 11 at 3:30 p.m.  [facility] October 28 at 8:15 a.m.   DISCHARGE MEDICATIONS:  1. Avapro 150 mg daily.  2  Coreg 12.5 mg b.i.d.  1. Digoxin 0.125 mg daily.  2. Lasix 40 mg b.i.d.  3. Spironolactone 25 mg nightly.  4. Pepcid AC daily.  5. Fish oil 1000 mg 3 tablets daily.  6. Multivitamin one daily.  7. Calcium 600 mg daily.  8. Folic acid [id number] mcg daily.  9. Lectithin 1200 mg daily.  10.Aspirin 81 mg daily.  11.Metformin 500 mg 3 times a day.  12.Fluoxetine 10 mg daily.  13.Coumadin as previously prescribed.  14.Vytorin 10/80 mg  q.p.m.  15.Amaryl 4 mg daily.  16.Claritin as needed.  17.Nitroglycerin 0.4 mg sublingual p.r.n. chest pain.      Mason Arnold, ANP      Mason Stanford Breed, Arnold, [facility]  Electronically Signed   CB/MEDQ  D:  09/21/2007  T:  09/21/2007  Job:  [id number]   cc:   Mason Sprang, Arnold, [facility]  Mason Arnold,FACP,FCCP

## 2011-04-25 NOTE — H&P (Signed)
Mason Arnold, Mason Arnold            ACCOUNT NO.:  [id number]   MEDICAL RECORD NO.:  [id number]          PATIENT TYPE:  OIB   LOCATION:  2867                         FACILITY:  [facility]   PHYSICIAN:  Ethelle Lyon, M.D. LHCDATE OF BIRTH:  1941/05/25   DATE OF ADMISSION:  08/09/2004  DATE OF DISCHARGE:  08/09/2004                                HISTORY & PHYSICAL   CHIEF COMPLAINT:  The patient for planned outpatient left arm venogram  pending planned ICD to biventricular ICD right ventricular upgrade/ICD  upgrade.   HISTORY OF PRESENT ILLNESS:  A 71 year old gentleman with ischemic  cardiomyopathy and ejection fraction 25% with class 2 to 3 heart failure. He  is planned for ICD to biventricular pacer upgrade by Dr. Caryl Comes. Dr. Caryl Comes  requested left arm venography to assess patency of the vein.   PAST MEDICAL HISTORY:  1.  Ischemic cardiomyopathy as above.  2.  Status post coronary artery bypass grafting.  3.  Congestive heart failure.  4.  Dyslipidemia.  5.  Hypertension.  6.  Status post ICD implantation.  7.  Diabetes mellitus.   ALLERGIES:  Intolerance to ACE INHIBITOR due to cough.   CURRENT MEDICATIONS:  1.  Avapro 150 per day.  2.  Coreg 12.5 mg twice per day.  3.  Digoxin 0.125 mg per day.  4.  Lasix 40 mg twice per day.  5.  Spironolactone 25 mg once per day.  6.  Flax seed oil.  7.  Multivitamin.  8.  Calcium.  9.  Folate.  10. ____________________.  11. Aspirin 325 mg per day.  12. Tylenol p.r.n.  13. Amaryl 2 mg q.a.m.  14. Vytorin 10/40 one per day.  15. Niaspan 750 mg per day.  16. Multivitamin.   SOCIAL HISTORY:  Never smoker. Denies alcohol use.   FAMILY HISTORY:  Noncontributory.   REVIEW OF SYSTEMS:  Negative in detail except for above.   PHYSICAL EXAMINATION:  GENERAL:  This is a generally fairly well appearing  male in no distress with heart rate 59, blood pressure 100/64, oxygen  saturation 97%, and temperature of 97.4.  NECK:  He has no  jugular venous distention, no thyromegaly.  LUNGS:  Clear to auscultation.  HEART:  He has a laterally displaced point of maximal cardiac impulse. There  is a regular rate and rhythm. No murmur or S3.  ABDOMEN:  Soft, nondistended, nontender. No hepatosplenomegaly. Bowel sounds  normal.  EXTREMITIES:  Warm without clubbing, cyanosis, edema, or ulceration.   IMPRESSION/RECOMMENDATIONS:  Plan venogram to exclude occlusion of the left  subclavian vein.      WED/MEDQ  D:  01/09/2005  T:  01/09/2005  Job:  [id number]

## 2011-04-25 NOTE — H&P (Signed)
NAMEJADIN, NOVELLO            ACCOUNT NO.:  [id number]   MEDICAL RECORD NO.:  [id number]          PATIENT TYPE:  OIB   LOCATION:  CARD                          FACILITY:  [facility]   PHYSICIAN:  Ethelle Lyon, M.D. LHCDATE OF BIRTH:  09/09/1941   DATE OF ADMISSION:  08/09/2004  DATE OF DISCHARGE:                                HISTORY & PHYSICAL   REASON FOR ADMISSION:  Outpatient left arm venogram.   HISTORY OF PRESENT ILLNESS:  Mr. Polishchuk is a 70 year old gentleman with  ischemic cardiomyopathy with an ICD in place.  He has persistent heart  failure, and Dr. Caryl Comes has recommended upgrade to biventricular pacer with  ICD capability.  Dr. Caryl Comes has requested left arm venogram to assess patency  of the subclavian vein prior to insertion.  There has been no swelling of  the arm.   PAST MEDICAL HISTORY:  1.  Coronary artery disease, status post CABG.  2.  Ischemic cardiomyopathy.  3.  Congestive heart failure.  4.  Dyslipidemia.  5.  Hypertension.  6.  Diabetes mellitus.  7.  Status post ICD implantation.   CURRENT MEDICATIONS:  1.  Avapro 150 mg per day.  2.  Carvedilol 12.5 mg twice per day.  3.  Digoxin 0.25 mg per day.  4.  Spironolactone 25 mg q.h.s.  5.  Calcium.  6.  Folate.  7.  Multivitamin.  8.  Aspirin.  9.  Vitamin C.  10. Amaryl 2 mg per day.  11. Lasix 40 mg twice per day.  12. Vytorin 10/40 one per day.  13. Niaspan 750 mg per day.   ALLERGIES:  Noreene Larsson, Wendi Maya.   SOCIAL HISTORY:  Denies prior tobacco abuse and alcohol abuse.   FAMILY HISTORY:  Noncontributory.   REVIEW OF SYSTEMS:  Negative in detail except as above with the exception of  exertional dyspnea with less than one flight of stairs.   PHYSICAL EXAMINATION:  GENERAL:  This is a generally well-appearing man in  no distress.  VITAL SIGNS:  Heart rate 59, blood pressure 118/76.  Oxygen saturation is  96% on room air.  LUNGS:  Clear.  HEART:  Regular rate and rhythm.  The  previous ICD site is well healed  without signs of infection.  The is a normal S1 and S2 with regular rate and  rhythm; no S3.  ABDOMEN:  The abdomen is benign.  EXTREMITIES:  Both arms are without edema.   IMPRESSION/RECOMMENDATION:  We will proceed with planned left arm venogram.      WED/MEDQ  D:  12/19/2004  T:  12/19/2004  Job:  [id number]

## 2011-04-25 NOTE — Discharge Summary (Signed)
[place]. Mason Arnold  Patient:    Mason Arnold                      MRN: [id number] Adm. Date:  [id number] Disc. Date: 09/23/99 Attending:  Lily Lovings Dictator:   Shelle Iron, P.A. CC:         Len Childs, M.D.             Denice Bors Stanford Breed, M.D. [facility]                           Discharge Summary  FINAL DIAGNOSES: 1. Coronary artery disease. 2. Hypertension. 3. Nocturnal hypoxic episodes.  PROCEDURES: 1. September 08, 1999, left heart catheterization by Dr. Olevia Perches. 2. September 08, 1999, emergent coronary artery bypass x 4 with left internal mammary    artery to the left anterior descending, saphenous vein graft to the circumflex    artery, saphenous vein graft to the ramus and saphenous vein graft to    posterolateral.   Surgeon:  Dr. Tharon Aquas Trigt.  HISTORY:  This is a 70 year old gentleman with history of rheumatic heart disease in the past and abnormal aortic valve per patient.  There are no records that were available.  Past history of hypertension.  Patient was having sex with his wife in the evening of admission and immediately after he developed substernal chest pain with radiation to the upper extremities bilaterally.  Positive diffuse diaphoresis and shortness of breath, no nausea or vomiting.  Pain began at approximately 10:40 p.m. and continued with having pain despite nitroglycerin given, heparin and aspirin.  Patient subsequently admitted to [facility] on September 07, 1999.  [facility] COURSE:  At that time, Dr. Stanford Breed saw the patient.  He was subsequently taken the following morning, September 08, 1999, to the catheterization lab and underwent left heart catheterization by Dr. Eustace Quail.  This showed significant three-vessel disease.  Assessment of the catheterization was that he probably had an acute non-Q-wave MI with severe three-vessel coronary artery disease and this was discussed with  Dr. Tharon Aquas Trigt.  Patient was referred to Dr. Prescott Gum or emergent coronary artery bypass.  Dr. Prescott Gum spoke with the patient and discussed the surgery.  Risks and benefits were explained.  Patient understood nd agreed.  Patient had intra-aortic balloon pump put in at the catheterization lab. He subsequently was taken to the OR.  On September 08, 1999, the same day, patient  underwent coronary artery bypass x 4 with LIMA to LAD, saphenous vein graft to posterolateral branch, vein graft to the ramus, saphenous vein graft to the circumflex branch.  Patient tolerated the procedure well and no intraoperative complications occurred.  Postoperatively he noted improved global function. Patient did satisfactorily, extubated on the operative day.  The first postoperative day he was doing satisfactorily.  The intra-aortic balloon pump was still in place.  This was discontinued the following morning, second postoperative day.  He continued to do satisfactorily.  Blood pressure stable.  He continued o progress satisfactorily.  He remained essentially afebrile, did have a temperature to 100.8 on first postoperative day.  On the second postoperative day, the intra-aortic balloon pump was discontinued at 6 p.m. on the evening of October , 2000.  He continued to do satisfactorily.  He had good peripheral pulses noted after the balloon was removed.  He was subsequently transferred to [facility] on September 12, 1999, fourth postoperative day.  He continued to progress satisfactorily. No untoward events occurred during this portion of his stay.  Blood pressure was monitored and he was put on Altace 2.5 mg q.d.  He went through his cardiac rehabilitation satisfactorily.  He had no problems with this.  He continued to progress.  He was transferred to the [facility] and there he stayed until September 15, 1999 at which time he transferred to 2000, which was the stepdown unit.  On the  fourth postoperative day  he was doing well, no complications occurred.  He continued with his cardiac rehabilitation phase I and was getting about quite well. He was started on Coumadin.  On September 19, 1999, the 11th postoperative day, he had an episode of what appeared to be some type of seizure activity which was probably related to hypoxia during the night.  This was concerning at that time. He appeared to desaturate at night, subsequently put on oxygen and arrangements  were made to have him go home on home O2.  He did do well other than this episodes and on the O2 at night, he had no more episodes.  He was subsequently prepared or discharge.  DISCHARGE MEDICATIONS:  1. Lanoxin 0.25 mg q.d.  2. Lasix 40 mg q.d.  3. Lopressor 50 mg q.d.  4. Combivent inhaler two puffs q.i.d.  5. Altace 2.5 mg q.12h.  6. Multivitamin with zinc and folic acid one q.d.  7. Flovent one puff t.i.d.  8. Coumadin 2.5 mg q.d.  9. Aldactone 25 mg b.i.d. 10. Darvocet-N 100 one or two p.o. q.4-6h. p.r.n. pain.  His PT at time of discharge was 21, his INR was 2.5.  His last CBC showed white  cell count 15.4, hemoglobin 10.5, hematocrit 29.6, platelets 586.  Sodium 136, potassium 4.6, chloride 102, CO2 28, BUN 23, creatinine 1.1, glucose 104, calcium 8.2.  Patient was subsequently discharged home on September 23, 1999 in satisfactory and stable condition. DD:  09/23/99 TD:  09/24/99 Job: [id number]

## 2011-04-25 NOTE — Discharge Summary (Signed)
NAMEGARRIS, BESCH            ACCOUNT NO.:  [id number]   MEDICAL RECORD NO.:  [id number]          PATIENT TYPE:  INP   LOCATION:  [id number]                         FACILITY:  [facility]   PHYSICIAN:  Jenkins Rouge, M.D.     DATE OF BIRTH:  1940-12-16   DATE OF ADMISSION:  12/17/2005  DATE OF DISCHARGE:  12/18/2005                                 DISCHARGE SUMMARY   ADDENDUM:  Previously stated that Mr. Palomino needed to have lipids and  liver checked in the next few weeks.  However, these levels were checked on  this inpatient visit.  AST of 33, ALT 39.  Lipid panel with total  cholesterol 139, triglycerides 306, HDL 33, LDL of 45.  TSH was also checked  at 3.445.      Rosanne Sack, NP    ______________________________  Jenkins Rouge, M.D.    MB/MEDQ  D:  12/18/2005  T:  12/19/2005  Job:  [id number]

## 2011-04-25 NOTE — Discharge Summary (Signed)
Mason Arnold, Mason Arnold                        ACCOUNT NO.:  [id number]   MEDICAL RECORD NO.:  [id number]                   PATIENT TYPE:  OIB   LOCATION:  2899                                 FACILITY:  [facility]   PHYSICIAN:  Deboraha Sprang, M.D.               DATE OF BIRTH:  [birth year]   DATE OF ADMISSION:  08/14/2004  DATE OF DISCHARGE:  08/14/2004                                 DISCHARGE SUMMARY   DISCHARGE DIAGNOSES:  1.  A Guidant 1861 implantable cardioverter-defibrillator for recall,      elective replacement interval.  2.  Implant of Guidant Vitality DS model T125 DR, serial number [id number],      change-out performed September 7.  3.  Class III congestive heart failure symptoms.   SECONDARY DIAGNOSES:  1.  Ischemic cardiomyopathy, ejection fraction 25% by Cardiolite study      November 2004, history of anterior wall myocardial infarction status      post coronary artery bypass graft surgery 2000.  Left heart      catheterization 2002, showing all grafts patent.  2.  History of ventricular tachycardia.  3.  Sinus node dysfunction with atrioventricular upgrade.  4.  Cardiolate November 2004:  Scar at the apical aspect of the lateral      wall, also scarring of the mid- to distal anterior wall, no ischemia,      ejection fraction 25%.  5.  Dyslipidemia.   PROCEDURES:  August 14, 2004, successful generator change-out with  implantation of [place] DS model T125 DR, Deboraha Sprang, M.D.   DISCHARGE DISPOSITION:  The patient is discharging the same day.  The  incision site looks good without erythema, drainage, or swelling.  The  patient is not complaining of pain at that site.   He goes home with the following medications:  1.  Avapro 150 mg daily.  2.  Coreg 12.5 mg twice daily.  3.  Digoxin 0.125 mg daily.  4.  Spironolactone 25 mg daily.  5.  Multivitamin daily.  6.  Calcium supplement 600 mg daily.  7.  Folic acid [id number] mcg daily.  8.  Enteric-coated aspirin  325 mg daily.  9.  Amaryl 2 mg daily in the morning.  10. Loratadine 10 mg daily at bedtime.  11. Niacin 500 mg daily at bedtime.  12. Vytorin 10/40 mg one daily at bedtime.  13. Tussionex suspension 5 mL every six hours as needed for persistent      cough.   PAIN MANAGEMENT:  Tylenol 325 mg one to two tablets every four to six hours  as needed.   ACTIVITY:  He is to avoid showering for the next week.  He is to sponge-  bathe only.   DIET:  Low-sodium, low-cholesterol diet.   He is to follow up at the pacer clinic, [facility], [address], Tuesday, September 04, 2004, at 9:45 in the  morning.   BRIEF HISTORY:  The patient is a 70 year old male.  He has a history of  ischemic cardiomyopathy with markedly depressed left ventricular ejection  fraction, last measured at 25% at [facility] study November 2004.  He has  class III congestive heart failure symptoms.  Cardiopulmonary study exercise  study shows MVO2 was 25 mL/kg per minute.  The patient has an [place]  recalled device.  The device is approaching ERI.  The patient agrees to  proceed with explantation and reimplantation.  Risks and benefits are  described to the patient.  This patient will present electively September 7  for the procedure.   HOSPITAL COURSE:  As described in discharge disposition.  The patient had  successful generator change-out on September 9.  Defibrillator function  testing was less than or equal to 14 joules.  The patient is doing well  postprocedurally and will follow up with Dr. Caryl Comes as dictated above.      [name], P.A.                    Deboraha Sprang, M.D.    GM/MEDQ  D:  08/14/2004  T:  08/14/2004  Job:  [id number]   cc:   Darrick Penna. Linna Darner, M.D. [facility]   Kirk Ruths, M.D. [facility]

## 2011-04-25 NOTE — Op Note (Signed)
NAMEGEROGE, CRISMAN NO.:  [id number]   MEDICAL RECORD NO.:  [id number]                   PATIENT TYPE:  OIB   LOCATION:  2867                                 FACILITY:  [facility]   PHYSICIAN:  Ethelle Lyon, M.D. [facility]         DATE OF BIRTH:  [birth year]   DATE OF PROCEDURE:  08/09/2004  DATE OF DISCHARGE:  08/09/2004                                 OPERATIVE REPORT   PROCEDURE:  Left arm venogram.   CARDIOLOGIST:  Ethelle Lyon, M.D.   INDICATION:  Mr. Gerrior is a 70 year old gentleman with ischemic  cardiomyopathy, whose previously placed ICD has been recalled.  He is  planned for upgrade to ICD with biventricular pacing capabilities next week.  He is referred by  Dr. Deboraha Sprang for venogram to exclude occlusion of  the subclavian vein.   PROCEDURAL TECHNIQUE:  Informed consent was obtained.  Via an IV in the left  antecubital fossa, 10 mL of iodinated contrast was injected.  Imaging was  performed by using cine.  The patient tolerated the procedure well and was  transferred to the holding room in stable condition.   COMPLICATIONS:  None.   FINDINGS:  Patent left subclavian vein with mild stenosis at entry site of  the lead.                                               Ethelle Lyon, M.D. [facility]    WED/MEDQ  D:  08/09/2004  T:  08/10/2004  Job:  [id number]   cc:   Deboraha Sprang, M.D.

## 2011-04-25 NOTE — Op Note (Signed)
[place]  Patient:    Mason Arnold                      MRN: [id number] Proc. Date: 09/08/99 Adm. Date:  [id number] Attending:  Lily Lovings CC:         Len Childs, M.D.             Anesthesia Department                           Operative Report  INTRAOPERATIVE TRANSESOPHAGEAL ECHOCARDIOGRAM  INDICATIONS FOR PROCEDURE:  Mason Arnold is a 70 year old who has sustained an  acute myocardial infarction, with congestive heart failure and continued pain -  requiring insertion of intra-aortic balloon pump.  Was found to have significant three-vessel coronary artery disease, and is brought to the operating room for emergent coronary artery bypass grafting.  He is felt to have compromised LV function, and we have been asked to place the TE probe for evaluation of cardiac function and structures.  DESCRIPTION:  The patient is brought to the operating room directly from the unit. Under local anesthesia and sedation, pulmonary artery and radial arterial lines are placed.  The patient is then induced under general anesthesia.  The trachea is intubated, and the TE probe is lubricated and passed oropharyngeally into the stomach, and withdrawn for imaging of the cardiac structures.  PRECARDIOPULMONARY BYPASS TRANSESOPHAGEAL EXAMINATION:  Left ventricle:  This is a markedly depressed left ventricular chamber, with global hypokinesis noted.  It is a dilated LV chamber.  The inferior wall is essentially akinetic.  The inferoseptal wall was akinetic.  The septal wall as it conjoins ith the anterior wall is significantly hypokinetic.  There is contractility in the lateral and anterolateral walls, but this is also diminished.  Overall, this is a poor LV function, and significantly depressed ejection fraction noted. Papillary muscles are seen, and are well-outlined.  On long axis view, the apex is frankly dyskinetic, and the long  axis view of the left ventricular chamber again shows he nearly akinetic inferior wall.  _________.  There are no masses noted within.  The papillary muscles and chordae are well-outlined, and are intact.  Mitral valve:  This is a normal-appearing mitral valve.  There is some degree of annular dilatation with Doppler examination.  This reveals a very small central  mitral regurgitant jet noted.  Overall, the anterior and posterior leaflets are  well-visualized, and normal in structure, and appear to coapt below the level of the annulus.  Left atrium:  This is a normal left atrial chamber.  There are no masses noted within.  The interatrial septum is intact.  The appendage is seen and visualized. There are no masses noted within.  Right ventricle:  This is a normal, heavily-trabeculated right ventricular chamber.  Tricuspid valve:  Normal, thin, compliant, and mobile tricuspid valve.  Right atrium:  Normal right atrial chamber.  Pulmonary artery catheter noted within.  The patient is placed on cardiopulmonary bypass.  Hypothermia is begun. Coronary artery bypass grafting is carried out by Dr. Tharon Aquas Trigt.  The patient is rewarmed, and separated from cardiopulmonary bypass with the initial attempt.  POSTCARDIOPULMONARY BYPASS TRANSESOPHAGEAL EXAMINATION (LIMITED EXAM):  Left ventricle:  Separation of cardiopulmonary bypass takes place at the initial attempt.  The left ventricular chamber remains depressed; but overall, compared to the precardiopulmonary bypass, appears to be improved - with  more contractile patterns noted in the lateral and anterolateral wall.  There is some movement of the septal wall now, though this remains frankly hypokinetic.  The inferior wall remains frankly akinetic; but the antero- and anterolateral wall appears improved in its contractile pattern and its thickening pattern, compared to the precardiopulmonary bypass period.  The rest of  the cardiac examination is essentially the same.  On Doppler exam of the mitral valve, there is slightly more mitral regurgitant flow noted in the early postbypass period; but again, amounts to no more than 1+ mitral regurgitant flow in the early postbypass period.  The rest of the cardiac examination is as previously described.  The patient was returned to the cardiac intensive care unit in stable but critical condition. DD:  09/08/99 TD:  09/07/99 Job: [id number]

## 2011-04-25 NOTE — H&P (Addendum)
Mason Arnold, DUCASSE NO.:  [id number]   MEDICAL RECORD NO.:  [id number]          PATIENT TYPE:  INP   LOCATION:  4713                         FACILITY:  [facility]   PHYSICIAN:  Jenkins Rouge, M.D.     DATE OF BIRTH:  [birth year]   DATE OF ADMISSION:  12/17/2005  DATE OF DISCHARGE:                                HISTORY & PHYSICAL   PRIMARY CARE PHYSICIAN:  Darrick Penna. Linna Darner, M.D.   PRIMARY CARDIOLOGIST:  Kirk Ruths, M.D.   PRIMARY ELECTRO PHYSIOLOGIST:  Deboraha Sprang, M.D.   PATIENT PROFILE:  A 70 year old white male with a prior history of CAD and  ischemic cardiomyopathy who presents with progressive dyspnea and presumed  heart failure.   PROBLEM LIST:  1.  CAD.      1.  Status post acute MI September 08, 1999 with urgent/emergent CABG x4          with placement of a LIMA LAD vein graft to the ramus, a vein graft          to the obtuse marginal and a vein graft to the PDA.      2.  Multiple catheterizations since CABG revealing three vessel disease          with patent grafts, with the last occurring in June of 2006 showing          an EF of 15% to 20%.  2.  Ischemic cardiomyopathy/nonsustained VT/congestive heart failure.      1.  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      2.  Apr 13, 2004 generator change out to a [employer] DS.  3.  Hypertension.  4.  Hyperlipidemia.  5.  Type 2 diabetes mellitus.   HISTORY OF PRESENT ILLNESS:  A 70 year old white male with a prior history  of CAD, nonsustained VT and ischemic cardiomyopathy who was in his usual  state of health until approximately 2 weeks when he began to notice  increasing dyspnea on exertion, decreased exercise tolerance, with stable  weight at 185 pounds.  On Sunday, December 14, 2005, he was playing golf and  developed acute shortness of breath followed by a brief episode of syncope  and ICD shock.  Specifically, the patient says he sat down after feeling  short of breath, and felt as  though he blacked out, followed immediately by  an ICD shock.  Following this, he stopped playing golf, and a friend drove  him home.  When he got home, he still felt short of breath, and he took an  extra Lasix 40 mg x1 with good urine output all night and a 3 pound weight  loss by the next morning (December 15, 2005), down to 182 pounds.  He took 80  mg of Lasix b.i.d. on Monday, December 15, 2005 with modest urine output, and  that night felt orthopneic.  On the morning of December 16, 2005, his weight  was up 2 pounds from the prior day (up to 184), and he was short of breath  for the majority of the day yesterday and orthopneic, using  2-3 pillows last  night.  Today, he was fairly short of breath just with talking, and his wife  convinced him to come into the ED for further evaluation.  Currently, he  says that he still has some abdominal distention and has had to increase the  size of his belt by 3 belt loops.  At rest, currently sitting up, he feels  okay.   The patient also reports a 4-6 week history of constant mild left-sided  chest pain in the area of his ICD.  It does not limit his activities and  does not get worse with exertion.   ALLERGIES:  ACE INHIBITOR causes cough.   CURRENT MEDICATIONS:  1.  Lasix 40 mg b.i.d. (has been taking 80 mg b.i.d. since Sunday, December 14, 2005).  2.  Avapro 150 mg daily.  3.  Coreg 12.5 mg b.i.d.  4.  Digoxin 0.125 mg daily.  5.  Spironolactone 25 mg daily.  6.  Multivitamin one daily.  7.  Calcium plus D 600 mg daily.  8.  Folic acid [id number] mcg daily.  9.  Aspirin 325 mg daily.  10. Metformin ER 500 mg daily.  11. Vytorin 10/80 mg q.h.s.  12. Glimepiride 4 mg daily.  13. Lecithin 1200 mg daily.   FAMILY HISTORY:  Mother died at age 37 of old age.  Father died of an MI and  CHF at age 17.  He has 3 brothers; 1 died of a brain anemia.  He has 4  sisters.  All of his living siblings are alive and well.  They have no  history of CAD,  hypertension, hyperlipidemia, diabetes or stroke.   SOCIAL HISTORY:  He lives in [place] with his wife.  He is a disabled  Conservation officer, [employer].  He has never smoked cigarettes.  Denies any alcohol use.  He walks between 4 and 5 miles a week with his dog.  He does watch his salt  intake, is very compliant with his medications and weighs himself on a daily  basis.   REVIEW OF SYSTEMS:  Positive for decreased exercise tolerance, increased  dyspnea on exertion, a syncopal episode, abdominal distention.  He has a  history of diabetes and a constant left chest pain.  All other systems  reviewed and negative.   PHYSICAL EXAMINATION:  GENERAL:  A pleasant white male in no acute distress.  Awake, alert and oriented x3.  NECK:  No bruits with mildly elevated JVP.  LUNGS:  Respirations regular and unlabored.  Clear to auscultation.  CARDIAC:  Regular S1 and S2.  Positive S4.  No murmurs.  ABDOMEN:  Obese, soft, nontender.  Perhaps mildly distended.  Bowel sounds  present x4.  EXTREMITIES:  Warm and dry, pink.  No clubbing, cyanosis, or edema.  Dorsalis pedis pulses are 2+ and equal bilaterally.  VITAL SIGNS:  Blood pressure is 118/69, heart rate 60, respirations 18,  temperature 98.3.  Pulse oximetry is 95% on room air.   ____ QA MARKER: 383 ____ FINDI  sinus rhythm with a rate of 60, left bundle branch block and left axis  deviation.  There is poor R wave progression.  All of his laboratory work is  pending.   ASSESSMENT AND PLAN:  1.  Ischemic cardiomyopathy/congestive heart failure.  The patient reports a      2-week history of progressive dyspnea on exertion, decrease in exercise      tolerance which has been significantly worsened since Sunday,  December 14, 2005, since his ICD shock.  Besides dyspnea, he has had increased      abdominal girth, widening his belt by 3 loops.  He has been taking     additional Lasix twice a day with diminishing returns and persistent      symptoms.   Will plan to admit him, cycle cardiac markers, check x-ray,      BNP and BMET.  We will treat with Lasix 80 mg IV b.i.d. and continue his      home medications which include beta blocker therapy, digoxin and      Spironolactone.  2.  Coronary artery disease.  The patient has had a chronic constant left      upper chest pain for the past 4-6 weeks.  It does not sound cardiac in      nature.  It is not worsened with exertion or improved with rest.  Will      cycle cardiac markers.  His ECG shows no acute changes.  Will continue      his home medications.  We do have to question if ischemia may be causing      his recent bouts of dyspnea and syncope on Sunday.  His last      catheterization as in June of 2006 by Dr. Johnsie Cancel and showed patent      grafts.  3.  History of nonsustained ventricular tachycardia and recent syncope      episode and ICD shock on December 14, 2005.  Plan to interrogate in the      a.m.  4.  Hypertension, stable.  Continue current regimen.  5.  Hyperlipidemia.  Continue statin.  Will check LFT's and lipids.      Rogelia Mire, NP    ______________________________  Jenkins Rouge, M.D.    CRB/MEDQ  D:  12/17/2005  T:  12/18/2005  Job:  [id number]

## 2011-04-25 NOTE — Discharge Summary (Signed)
[place]. [facility]  Patient:    Mason Arnold, Mason Arnold                     MRN: [id number] Adm. Date:  [id number] Disc. Date: 06/06/00 Attending:  Garth Bigness Dictator:   Margarita Sermons, P.A.C. [facility] CC:         Unice Cobble, M.D.                           Discharge Summary  HISTORY:  This is a 70 year old white male with known coronary artery disease per prior coronary artery bypass graft in 9/00.  Other problems include hypertension, left ventricular dysfunction, hyperlipoproteinemia.  He is admitted now with progressive volume overload associated with presyncopal episode.  He does note paroxysmal nocturnal dyspnea and orthopnea.  ACCESSORY CLINICAL FINDINGS:  Admission white count was 17,900 with 89% neutrophils.  Hemoglobin was 10.4, hematocrit 29.1.  Repeat white count two days later was down to 12,000.  Electrolytes and renal function on admission were normal with a creatinine of 1.4 and a BUN of 30; however, the following day, his creatinine was up to 2.1 with a BUN of 43.  On the day of discharge, his creatinine was down to 2.0 with a BUN of 56. Electrolytes were normal.  X-ray of the shoulder revealed degenerative changes in the left [facility] joint and left humeral head.  Chest x-ray revealed cardiomegaly with mild congestive heart failure.  COURSE IN THE HOSPITAL:  Patient with known coronary artery disease was admitted with volume overload.  He was diuresed.  He was given Zaroxolyn for a few days nd this was stopped.  His Lasix was increased to 40 mg b.i.d. We initially began him on digoxin, but his rate dropped into the low 40s and this was therefore stopped. For his cerebral symptoms, he was seen in consultation by Dr. Cristopher Peru who noted that his symptoms were always related to postural change and recommended 48 hour Holter rather than proceeding with EP evaluation at this time.  Aldactone as added to his regimen.  He did have a  rise in his creatinine as above, which was  trending downward at the time of discharge.  On the day of discharge, Dr. Stanford Breed saw him and recommended holding his Lasix and Aldactone on the day of discharge and we will check a BMP in two days.  FINAL DIAGNOSES: 1. Ischemic cardiomyopathy with congestive heart failure- improved with diuresis. 2. Coronary artery disease with prior coronary artery bypass graft, 9/00. 3. Controlled hypertension. 4. Recent febrile illness with leukocytosis- treated with Levaquin this admission-    no obvious source- defervesced uneventfully. 5. Treated hyperlipoproteinemia. 6. Intermittent presyncope- seems to be posturally related.  DISPOSITION:  We will get a 48 Holter monitor.  Will check BMP in two days. Will see Dr. Margarita Sermons in two weeks and Dr. Stanford Breed in four weeks.  DISCHARGE MEDICATIONS: 1. Lasix 40 mg q. d. 2. Aldactone 25 mg q. d. 3. Lopressor 25 mg b.i.d. 4. Diovan 80 mg q. d. 5. Lipitor 40 mg q. d. 6. Aspirin 1 daily. DD:  06/06/00 TD:  06/05/00 Job: [id number]

## 2011-04-25 NOTE — Cardiovascular Report (Signed)
[place]. [facility]  Patient:    Mason Arnold, Mason Arnold                     MRN: [id number] Proc. Date: 04/16/01 Adm. Date:  [id number] Attending:  Fatima Sanger CC:         CV Laboratory  Denice Bors. Stanford Breed, M.D. [facility]  Nikki Dom, M.D., [facility]  Champ Mungo. Lovena Le, M.D. [facility]   Cardiac Catheterization  INDICATIONS:  Mr. Brierley is a pleasant 70 year old with a prior anterior wall infarction.  He subsequently underwent revascularization surgery.  He presented with wide complex tachycardia thought to be ventricular tachycardia. He was subsequently referred for cardiac catheterization.  PROCEDURE: 1. Left heart catheterization. 2. Selective coronary arteriography. 3. Selective left ventriculography. 4. Saphenous vein graft angiography x 3. 5. Selective left internal mammary angiography x 1.  CARDIOLOGIST:  Loretha Brasil. Lia Foyer, M.D. [facility]  DESCRIPTION OF THE PROCEDURE:  The procedure was performed from the right femora artery using 6-French catheter.  He tolerated the procedure well without complication.  He was taken to the holding area in satisfactory clinical condition.  HEMODYNAMIC DATA: The central aortic pressure was 108/65.  LV pressure 110/28. No gradient on pullback across the aortic valve.  ANGIOGRAPHIC DATA:  Ventriculography was performed in the RAO projection.  This revealed moderately severe reduction in global left ventricular function.  There was anteroapical hypokinesis, and the ejection fraction was calculated at 26%. There was trace mitral regurgitation.  The left main coronary artery was free of critical disease.  The LAD was totally occluded.  The intermediate had severe disease of 90% at the ostium.  The A-V circumflex had 70% narrowing.  There was a tiny first marginal branch that had 90% narrowing and a second marginal branch with some luminal irregularity with evidence of some bidirectional filling with the  patients vein graft.  The right coronary artery was severely diseased throughout its mid portion, crossing the right ventricular branch, and then essentially totally occluded. There was about 90% narrowing overriding the origin of the right ventricular branch.  The saphenous vein graft to the obtuse marginal branch was intact.  There was slight damping of the catheter at the ostium, but this was felt to be due to the small size of the graft.  There was also a small graft to the intermediate vessel that was less than 2 mm in size but widely patent.  The saphenous vein graft to the distal posterior descending branch was widely patent and large in caliber.  No critical disease was noted.  The internal mammary to the LAD was widely patent.  It filled the LAD and then retrograde filled at least two diagonal branches.  CONCLUSIONS: 1. Moderately severe reduction in global left ventricular function. 2. Patent internal mammary to the left anterior descending artery. 3. Patent saphenous vein grafts to the intermediate, obtuse marginal,    and posterior descending branch. 4. Wide complex tachycardia thought to be due to ventricular tachycardia.  DISPOSITION:  Electrophysiologic evaluation with probable implantation of implantable defibrillator will be recommended.  I have discussed the case with Dr. Stanford Breed. DD:  04/16/01 TD:  04/16/01 Job: [id number]

## 2011-04-25 NOTE — Assessment & Plan Note (Signed)
[facility]                              CARDIOLOGY OFFICE NOTE   QUAMEL, GREANEY                     MRN:          [id number]  DATE:10/21/2006                            DOB:          06-09-1941    Mason Arnold returns for followup today.  Since I last saw him, he is doing  extremely well.  There is no dyspnea on exertion.  No orthopnea, PND, pedal  edema, palpitations, presyncope, syncope, or exertional chest pain.   MEDICATIONS:  1. Avapro 150 mg p.o. daily.  2. Coreg 12.5 mg p.o. b.i.d.  3. Digoxin 0.125 mg p.o. daily.  4. Lasix 40 mg p.o. b.i.d.  5. Spirolactone 25 mg p.o. daily.  6. Fish oil.  7. Multivitamin.  8. Calcium.  9. Folate.  10.Lecithin.  11.Aspirin.  12.Metformin.  13.Glimepiride 4 mg p.o. daily.  14.Vytorin 10/80 q.h.s.  15.Coumadin as directed.  16.Aspirin 81 mg p.o. q.day.   PHYSICAL EXAM TODAY:  VITAL SIGNS:  Blood pressure 130/82.  Pulse 62.  NECK:  Supple with no bruits.  CHEST:  Clear.  CARDIOVASCULAR EXAM:  Regular rate and rhythm.  There is a 2/6 systolic  ejection murmur at the left sternal border.  EXTREMITIES:  His extremities show no edema.   His electrocardiogram shows atrial pacing.  There is left ventricular  hypertrophy with QRS widening and prior septal infarct.   DIAGNOSES:  1. Coronary artery disease,  status coronary artery bypass grafting.  2. Ischemic myopathy.  3. Status post ICD.  4. Atrial fibrillation.  5. Coumadin therapy.  6. History of cough with ACE inhibitors.  7. Diabetes mellitus.  8. Hypertension.  9. Hyperlipidemia.   PLAN:  [name] is doing extremely well from a cardiac standpoint.  We  will continue with his present medications.  He had laboratories drawn at  the [place] one week ago and we will have those forwarded to [place] for our records.  We need to review his renal function, liver function, cholesterol, and CBC  given his Coumadin use.  He is being followed in  the Coumadin clinic with a  goal INR of  2 to 3 for his atrial fibrillation.  He needs to follow up with  Dr. Caryl Comes concerning his ICD.  I will see him back in eight months.     Denice Bors Stanford Breed, MD, [facility]  Electronically Signed    BSC/MedQ  DD: 10/21/2006  DT: 10/21/2006  Job #: [id number]   cc:   Orlie Pollen, M.D. @ [facility] [place]

## 2011-04-25 NOTE — Op Note (Signed)
[place]. [facility]  Patient:    Mason Arnold                      MRN: [id number] Proc. Date: 09/08/99 Adm. Date:  [id number] Attending:  Lily Lovings CC:         Denice Bors. Stanford Breed, M.D. [facility]                           Operative Report  PREOPERATIVE DIAGNOSIS:  Acute anterolateral myocardial infarction, with postinfarction unstable angina; reduced left ventricular function, with preoperative cardiogenic shock; preoperative intra-aortic balloon pump placement.  POSTOPERATIVE DIAGNOSIS:  Acute anterolateral myocardial infarction, with postinfarction unstable angina, reduced left ventricular function, with preoperative cardiogenic shock, preoperative intra-aortic balloon pump placement.  OPERATION:  Urgent coronary artery bypass grafting x 4 (left internal mammary artery to left anterior descending, saphenous vein graft to ramus intermediate,  saphenous vein graft to obtuse marginal, saphenous vein graft to posterior descending).  SURGEON:  Len Childs, M.D.  ASSISTANT:  Earnstine Regal, P.A.  ANESTHESIA:  General.  ANESTHESIOLOGIST:  Ala Dach, M.D.  INDICATIONS:  The patient is a 70 year old white male without prior history of cardiac disease, who presented with acute onset of chest pain, shortness of breath, and EKG changes.  He was taken directly to the catheterization lab - where he was found to have severe three-vessel coronary disease, with TIMI-2 flow in the LAD and distal right coronaries, with tight 95% to 99% stenoses.  His overall ventricular function was poor, with ejection fraction of 20%, and he had evidence of pulmonary edema and hemodynamic instability.  A Swan-Ganz catheter was placed, and his LVEDP at catheterization was 40 mmHg.  An intra-aortic balloon pump was placed in the  catheterization lab, and a surgical consultation was obtained.  The patient was  evaluated, and the plan was to  resuscitate the patient with the balloon pump, and stabilize his circulation for a few hours; and then proceed with urgent coronary revascularization, in order to preserve his remaining myocardium and improve his chances of survival.  Prior to the operation, the patient was examined in the cardiac catheterization lab area, and the situation was discussed with both the patient and his wife.  I reviewed the indications and expected benefits of the coronary bypass operation, and discussed the details of the procedure, as well - including the use of cardiopulmonary bypass, general anesthesia, the placement of the surgical incisions saline, and the type of conduit we would use for the bypass grafts.  I reviewed the risks of the operation - including the risk of MI, CVA, bleeding, infection, and death.  The patient and wife understood that his risks of surgery would be greater due to his unstable condition, his reduced left ventricular function, and the severe nature of his coronary disease.  He agreed to proceed with the operation  under informed consent.  OPERATIVE FINDINGS:  The patient had left ventricular dilatation, and global hypokinesia by transesophageal echo preoperatively.  His CPK-MB enzymes the morning of surgery were dramatically elevated at 900, with a total CPK of over 6000. The patients coronaries were diffusely diseased.  The saphenous vein was of average to above-average quality.  The mammary artery was a small but adequate vessel, with excellent flow.  The distal circumflex was too small to graft.  The distal posterolateral branch of the right was too small to graft.  There was no  significant mitral regurgitation on echo.  Postbypass, the left ventricular function showed dramatic improvement globally.  The patient was given aprotinin for this operation, due to his preoperative use of a platelet receptor blocker (Integrelin) right up to the time of  surgery.  PROCEDURE:  The patient was brought directly from the CCU to the operating room - where invasive monitoring lines were placed under the direction of Dr. Orene Desanctis. The transesophageal echo was placed, and the preoperative evaluation was performed - which demonstrated global hypokinesia, and 1+ mitral regurgitation.  The patient was prepped and draped as a sterile field, and a median sternotomy as performed.  The saphenous vein was harvested from the left leg.  The left internal mammary artery was harvested on a pedicle graft from its origin at the subclavian vessels, and was a good vessel with excellent flow.  Heparin was administered, and the sternum was retracted.  The pericardium was opened, and pursestrings were placed in the ascending aorta and right atrium. he patient was cannulated, and placed on cardiopulmonary bypass after the ACT was therapeutic.  The patient was cooled to 32 degrees, and the coronaries were inspected.  The LAD, ramus intermediate, OM, and posterior descending were found to be adequate vessels for grafting.  The mammary artery and vein grafts were prepared for the distal anastomoses, and the cardioplegia catheter was placed for both antegrade and retrograde delivery of cold blood cardioplegia.  The patient was cooled to 28 degrees, and as the aortic crossclamp was applied, 500 cc of cold blood cardioplegia was delivered both antegrade and retrograde - with immediate cardioplegic arrest, and septal temperature dropped to less than 12 degrees. Topical iced saline slush was used to augment myocardial preservation, and a pericardial insulator pad was used to protect the left phrenic nerve.  The distal coronary anastomoses were then performed.  The first distal anastomosis was to the posterior descending.  This was a 1.5 mm vessel, with a proximal 99% stenosis.  A reversed saphenous vein was sewn end-to-side with a running 7-0 Prolene, and there  was good flow through the graft.  The second distal anastomosis was to the ramus intermediate.  This was a 1.5 mm, intramyocardial vessel, with a proximal 80% stenosis.  A reversed saphenous vein was sewn  end-to-side with a running 7-0 Prolene, and there was good flow through the graft. The third distal anastomosis was to the OM-1, which was a 1.5 mm vessel, with a  proximal 95% stenosis.  The reversed saphenous vein was sewn end-to-side with a  running 7-0 Prolene, and there was excellent flow through the graft. Cardioplegia was redosed through the vein grafts.  The fourth distal anastomosis was to the distal third of the LAD, which was a 1.5 to 1.8 mm vessel, with a proximal 99% stenosis.  The left internal mammary artery cuff was brought through an opening created in the left lateral pericardium, and was brought down on the LAD, and was sewn end-to-side with a running 8-0 Prolene.  There was excellent flow through he anastomosis, with immediate rise in septal temperature after release of the pedicle clamp on the mammary artery.  The mammary pedicle was secured to the epicardium, and the aortic crossclamp was removed.  The heart was cardioverted back to a regular rhythm.  A partial occluding clamp was placed on the ascending aorta, and the three proximal vein anastomoses were performed using a 4.0 mm punch and a running 6-0 Prolene.  The partial occluding clamp was removed, and the  vein grafts were perfused.  Each had excellent flow, and hemostasis was documented at the proximal and distal anastomoses.  The patient as rewarmed and reperfused, and temporary pacing wires were applied.  When the patient reached 37 degrees, the intra-aortic balloon pump was restarted - which was placed in the cath lab preoperatively, and the ventilator was turned on after the lungs were reexpanded.  The heart was filled with volume, and the patient weaned from  cardiopulmonary bypass on  low-dose dopamine, low-dose _________; and he had excellent hemodynamics.  LV function was improved on transesophageal echo. Protamine was administered and the cannulas were removed.  Hemostasis was adequate, and the mediastinum was irrigated with warm antibiotic irrigation.  The leg incision was irrigated, and closed in the standard fashion.  The pericardium was closed superiorly over the aorta and vein grafts, and the sternum was reapproximated with a steel wire after placement of two mediastinal and a left pleural chest tube.  The pectoralis fascia and subcutaneous layers were closed ith running Vicryl, and the skin was closed with staples.  Sterile dressings were applied, and the patient returned to the ICU in stable condition.  Total cardiopulmonary bypass time was 115 minutes, with aortic crossclamp time f 50 minutes. DD:  09/08/99 TD:  09/07/99 Job: [id number]

## 2011-04-25 NOTE — Cardiovascular Report (Signed)
[place]. [facility]  Patient:    Mason Arnold                      MRN: [id number] Proc. Date: 09/08/99 Adm. Date:  [id number] Attending:  Lily Arnold CC:         Denice Bors. Mason Arnold, M.D. [facility]             Mason Arnold, M.D.             Mason Arnold, M.D. [facility]             Cardiopulmonary Laboratory                        Cardiac Catheterization  PROCEDURES PERFORMED:  Right and left heart catheterization, coronary angiography, and balloon pump procedure.  INDICATIONS:  [name] is 70 years old and has had no prior known history f heart disease.  He had the onset of chest pain at 10:40 while having intercourse and came to the emergency room with severe chest pain and he looked cool and clammy.  His EKG showed right bundle branch block and left axis deviation and nonspecific ST-T changes.  He was brought to the laboratory for urgent evaluation with angiography.  On arrival to the laboratory, his oxygen saturation was 88%.   DESCRIPTION OF PROCEDURE:  The procedure was performed via the right femoral artery using arterial sheath and 6-French preformed coronary catheters.  After it was recognized there was severe three-vessel disease and severe left ventricular dysfunction, an intra-aortic balloon pump was placed via the right femoral artery. Because of the low saturations and severe left ventricular dysfunction, we passed a Swan-Ganz via the right femoral vein to the pulmonary artery.  The patient initially had chest pain in the laboratory estimated at about 4 on a scale of 1-10, and this resolved after placement of the balloon pump.  RESULTS: 1. The left main coronary artery is free of significant disease.  2. The left anterior descending artery had a 95% stenosis before a first large    septal perforator and a 70% stenosis just after the septal perforator.  The    distal vessel was a fairly good caliber vessel and it  was free of major    obstruction.  3. The circumflex artery gave rise to an intermediate branch, a marginal branch,    an atrial branch, and then was completely occluded.  There was 90% stenosis    in the intermediate branch.  There was 80% stenosis in the mid circumflex.    There were two posterolateral branches after the total occlusion, which filled    via collaterals.  4. The right coronary artery is a moderate sized vessel and gave rise to a right    ventricular branch, a posterior descending, and a posterolateral branch. There    was 70% proximal and 80% mid stenosis.  The vessel was diffusely diseased and    there was 99% distal stenosis before the posterior descending branch with TIMI-2    flow distally.  There was also a 90% stenosis in the mid portion of the    posterior descending branch.  LEFT VENTRICULOGRAM:  Left ventriculogram performed in the RAO projection showed hypokinesis of the anterolateral wall, akinesis of the apex, and hypokinesis of the inferior wall.  The estimated ejection fraction was 20% to 25%.  There was 2+ mitral regurgitation.  DISTAL AORTOGRAM:  A  distal aortogram was performed which showed patent renal arteries and no significant aortoiliac obstruction.  HEMODYNAMIC DATA:  The pulmonary artery pressure was 35/16, with a mean of 27.  Pulmonary wedge pressure was 22 mean.  Left ventricular pressure was 126/39. The aortic pressure was 126/93.  CONCLUSIONS: 1. Probable acute non-Q wave myocardial infarction, complicated by acute pulmonary    edema with severe three-vessel disease with 95% stenosis in the proximal left anterior descending artery, 90% stenosis in the intermediate branch of the circumflex artery, 80% stenosis in the mid circumflex artery, total occlusion of    the distal circumflex artery, 99% stenosis of the distal right coronary artery,    and severe left ventricular dysfunction with anterolateral wall and inferior     wall hypokinesis and apical wall akinesis. 2. Placement of intra-aortic balloon pump.  DISPOSITION:  Dr. Tharon Aquas Arnold was consulted and he, Dr. Stanford Arnold, and I felt that surgery was the patients best therapeutic option.  We will plan to stabilize him with the balloon pump over the next several hours and operate on him early Sunday morning.  ADDENDUM:  The initial EKG also showed Q-waves in V1 and V2, suggesting the patient had had a prior anterior wall infarction. DD:  09/08/99 TD:  09/08/99 Job: [id number]

## 2011-04-25 NOTE — Discharge Summary (Signed)
NAMETHIERRY, RITTENBERRY            ACCOUNT NO.:  [id number]   MEDICAL RECORD NO.:  [id number]          PATIENT TYPE:  INP   LOCATION:  [id number]                         FACILITY:  [facility]   PHYSICIAN:  Kirk Ruths, M.D. LHCDATE OF BIRTH:  [birth year]   DATE OF ADMISSION:  12/17/2005  DATE OF DISCHARGE:  12/18/2005                                 DISCHARGE SUMMARY   PRIMARY CARE PHYSICIAN:  Dr. Unice Cobble.   PRIMARY CARDIOLOGIST:  Dr. Kirk Ruths.   ELECTROPHYSIOLOGIST:  Dr. Virl Axe.   DISCHARGING DIAGNOSES:  1.  Congestive heart failure.  2.  Implantable cardioverter-defibrillator discharge.  3.  Ischemic cardiomyopathy.  4.  Intolerance to ACE inhibitors.  5.  Diabetes.  6.  Hypercholesterolemia.  7.  Hypertension.  8.  Chest x-ray abnormality, nodule right lower lobe.   PAST MEDICAL HISTORY:  1.  Coronary artery disease.      1.  Status post acute MI, October of 2000, with urgent/emergent CABG x4          with placement of a LIMA to the LAD, vein graft to the ramus, a vein          graft to the obtuse marginal and a vein graft to the PDA.      2.  Multiple catheterizations since that time, revealing three-vessel          disease with patent grafts, with the last cath occurring in June of          2006, showing an EF of 15% to 20%.  2.  Ischemic cardiomyopathy/nonsustained ventricular tachycardia/congestive      heart failure.      1.  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      2.  Generator change-out to a [employer] DS, May of 2005.  3.  Hypertension.  4.  Hyperlipidemia.  5.  Type 2 diabetes.   PROCEDURES THIS ADMISSION:  Interrogation of implantable cardioverter-  defibrillator on December 18, 2004, showing normal function with 1 shock in  ventricular fibrillation zone, 23 joules.  Appears to be due to an atrial  arrhythmia.   HOSPITAL COURSE:  Mr. Elks is a 70 year old Caucasian male with prior  history of CAD and ischemic  cardiomyopathy, who presented with progressive  dyspnea and presumed heart failure, medical history as stated above.  Mr.  Melendres began to experience symptoms of heart failure approximately 2  weeks ago with complaints of increased dyspnea on exertion, and decreased  exercise tolerance.  On Sunday, December 14, 2005, he was playing golf and  developed acute shortness of breath followed by a brief episode of syncope  and ICD shock.  Following this, the patient was driven home by a friend.  Upon returning home, the patient continued to feel short of breath; he took  an extra dose of Lasix and repeated extra doses of Lasix again on Monday.  However, on the morning of December 16, 2005, his weight was up 2 pounds from  the prior day.  He was still experiencing shortness of breath with  progressive orthopnea.  His wife finally convinced  him to come to the  emergency room for further evaluation.   On initial exam, the patient was found to be afebrile, vital signs stable,  pulse oximetry 95% on room air.  Initial chest x-ray showed cardiomegaly  without failure, defibrillator device in stable position, also noted to have  a stable 6- to 7-mm nodule in right lower lobe.  Followup CT may be of use  for further evaluation if clinically indicated.  It was decided to admit the  patient for aggressive diuresis and rule out for myocardial infarction.  Cardiac markers were negative, EKG without any acute changes.  Dr. Stanford Breed  in to see patient on the following day, patient with resolution of dyspnea,  chest discomfort and afebrile, BNP 56, hemoglobin 14.7, hematocrit 41.5.  ICD interrogated, results as stated above, patient without any further  complaints of discomfort, up ambulating in hall.  The patient has diuresed  approximately a liter since arrival to telemetry unit.  It was recorded in  emergency room notes that the patient was given Lasix and voiding without  difficulty; however, output was not  recorded.   DISCHARGE MEDICATIONS:  The patient is being discharged home with  instructions to continue his previous medications which include:  1.  Lasix 40 mg p.o. twice daily.  2.  Avapro 150 mg daily.  3.  Coreg 12.5 mg p.o. twice daily.  4.  Digoxin 0.125 mg daily.  5.  Spironolactone 25 mg daily.  6.  Vytorin 10/80 mg daily.   His other medications include:  1.  Fish oil 1000 mg 3 times daily.  2.  Multivitamin.  3.  Calcium.  4.  Folic acid.  5.  Lecithin.  6.  Aspirin 325 mg.  7.  Metformin 500 mg p.o. twice daily.  8.  Glimepiride 4 mg daily.   DIET:  The patient is to continue a low-fat, low-salt diabetic diet as  previously used.   ACTIVITY:  Increase activity as tolerated.   FOLLOWUP:  He has a followup appointment for blood work next Tuesday for a  BMET and then a followup appointment with Dr. Stanford Breed on January 02, 2006  at noon.  The patient will also lipids and liver labs checked  within the next few weeks, according to office note from December of '06; I  will try to arrange for this to be drawn next week.   DURATION OF DISCHARGE ENCOUNTER:  Thirty minutes.      Rosanne Sack, NP    ______________________________  Kirk Ruths, M.D. [facility]    MB/MEDQ  D:  12/18/2005  T:  12/19/2005  Job:  [id number]   cc:   Darrick Penna. Linna Darner, M.D. [facility]  [phone] [place]  [place] [address]   Brian Crenshaw, M.D. [facility]  [address]  [address]  [place]  [place] [address]   Deboraha Sprang, M.D.  [address] [place]  [place] [address]

## 2011-04-25 NOTE — Procedures (Signed)
[place]. [facility]  Patient:    CASSEY, BATESON                     MRN: [id number] Adm. Date:  [id number] Attending:  Fatima Sanger Dictator:   Finis Bud, M.D.                           Procedure Report  CARDIOVERSION INTUBATION REPORT  DATE OF BIRTH:  August 05, 1941  HISTORY OF PRESENT ILLNESS:  The anesthesia team was called down to the emergency room to emergently cardiovert Mr. Lichtenberger, who presented with ventricular arrhythmia, heart rate in the 170s, by Dr. Olevia Perches.  Upon arrival to the emergency room, the patient was alert and oriented.  O2 saturations 98%.  Systolic blood pressure 0000000.  The case was discussed with Dr. Olevia Perches and the patient in detail regarding need for general anesthesia secondary to n.p.o. status.  The patient had had a full meal less than six hours prior to arrival to the emergency room.  Dr. Olevia Perches expressed a need for emergent cardioversion with anesthesia.  PROCEDURE:  Subsequently, the patient was given etomidate 16 mg and succinylcholine 120 mg with rapid sequence induction with cricoid pressure.  A #8.0 endotracheal tube was placed without difficulty.  Positive bilateral breath sounds.  Positive CO2 by EZ Cap.  After the cardioversion, the patient was subsequently extubated without difficulty.  The patient was alert and oriented afterwards with vital signs stable, O2 saturations 99-100%.  The patient will be followed by Dr. Olevia Perches in the emergency room.  The patient was alert and talking after the procedure. DD:  04/15/01 TD:  04/15/01 Job: [id number]

## 2011-04-25 NOTE — Op Note (Signed)
[name], Mason Arnold                        ACCOUNT NO.:  [id number]   MEDICAL RECORD NO.:  [id number]                   PATIENT TYPE:  OIB   LOCATION:  2899                                 FACILITY:  [facility]   PHYSICIAN:  Deboraha Sprang, M.D.               DATE OF BIRTH:  07-28-1941   DATE OF PROCEDURE:  08/14/2004  DATE OF DISCHARGE:                                 OPERATIVE REPORT   PREOPERATIVE DIAGNOSIS:  1.  Previously-implanted defibrillator, on advisory recall.  2.  Ventricular tachycardia.  3.  Sinus node dysfunction.   POSTOPERATIVE DIAGNOSES:  1.  Previously-implanted defibrillator, on advisory recall.  2.  Ventricular tachycardia.  3.  Sinus node dysfunction.   PROCEDURE:  Explantation of a previously-implanted defibrillator with  implantation of a new defibrillator, with intraoperative defibrillation  threshold testing.   The patient has had a history of shortness of breath.  He underwent CPX  testing with an MVO2 measured at 25 L/kg.  I had this reviewed by Dr.  Haroldine Laws, who felt that all the data that he saw were consistent with an  noncardiac impairment in his exercise tolerance, and he is thus admitted for  standard device explantation and reimplantation.   Following the obtaining of informed consent, the patient was brought to the  electrophysiology laboratory and placed on the fluoroscopic table in supine  position.  After routine prep and drape of the left upper chest, lidocaine  was infiltrated in the prepectoral subclavicular region.  An incision was  made over the layer of the previous incision and carried down to the layer  of the device pocket using sharp dissection.  The pocket was opened, the  leads were freed up, and the device was explanted.   Interrogation of the previously-implanted lead demonstrated an R-wave of  21.6.  The impedance measurement was stable, but we have mis-recorded it,  and threshold is 1.3 V at 0.5 msec, current at  threshold is 1.5 MA, although  the calculation of the current suggests that the impedance was approximately  950 Ohms.  The P-wave was 4.1 mV with a pacing impedance of 640 Ohms and a  threshold of 1.1 V at 0.5 msec, current at threshold is 1.1 MA.   With these acceptable parameters recorded, the leads were attached to a  Guidant Vitality DS model T125DR ICD, serial number [id number].  Through the  device the bipolar P-wave was 3.1 mV with a pacing impedance of 594 Ohms and  a pacing threshold of 0.8 V at 0.5 msec.  The R-wave was 25 mV with a pacing  impedance of 782 Ohms and a threshold of 1.8 V at 0.5 msec.  High voltage  impedance was 40 Ohms.   With these acceptable parameters recorded, defibrillation threshold testing  was undertaken. Ventricular fibrillation was induced via the T-wave shock.  After a total duration of 5.5 seconds, a 14-joule shock was delivered  through  a measured resistance of 39 Ohms, terminating ventricular  fibrillation and restoring sinus rhythm.  With these acceptable parameters  recorded, the system was implanted.  The pocket was copiously irrigated with  antibiotic-containing saline solution, hemostasis was assured, the leads and  the pulse generator were placed in the pocket, and the wound was closed then  in three layers in the normal fashion.  The wound was washed, dried, and a  Benzoin and Steri-Strip dressing was applied.  Needle counts, sponge counts,  and instrument counts were correct at the end of the procedure according to  the staff.   The patient tolerated the procedure without apparent complications.                                               Deboraha Sprang, M.D.    SCK/MEDQ  D:  08/14/2004  T:  08/14/2004  Job:  [id number]   cc:   Electrophysiology Laboratory   [facility]

## 2011-04-25 NOTE — Discharge Summary (Signed)
[place]. [facility]  Patient:    Mason Arnold, Mason Arnold                     MRN: [id number] Adm. Date:  [id number] Disc. Date: [id number] Attending:  Lily Lovings Dictator:   Davis Gourd, P.A. CC:         Darrick Penna. Linna Darner, M.D. [facility]   Referring Physician Discharge [place]  DATE OF BIRTH:  Jan 28, 1941  PROCEDURES: 1. Cardiac catheterization. 2. Coronary arteriogram. 3. Left ventriculogram.  HOSPITAL COURSE:  Mason Arnold is a 70 year old male with a history of hypertension, hyperlipidemia, and prior CABG surgery who had an MI in September room 2000 requiring urgent CABG.  He was seen in the office on September 07, 2000 for chest tightness and bilateral elbow pain that started at rest.  He had been to cardiac rehab on the day of admission, and had some extra shortness of breath but did not have any pain until after he was resting.  He was admitted to rule out and for further evaluation.  His enzymes were negative for MI and he had a catheterization scheduled for September 08, 2000.  He had a cardiac catheterization done on September 08, 2000 which showed severe native three-vessel disease with patent grafts and a 50% lesion in the PDA after anastomosis.  His EF was calculated at 36%.  Because all grafts were open, it was felt that medical therapy was recommended.  He tolerated the catheterization well, and the sheath was removed without difficulty.  The next day, because his left ventricular end diastolic pressure was 31, it was felt that his Lasix should be increased from 40 b.i.d. to 60 a.m. and 40 p.m.  He is to get a BMET in one week, and he is to follow up with Dr. Stanford Breed or the P.A. in two to four weeks.  LABORATORY VALUES:  Sodium 137, potassium 4.0, chloride 103, CO2 27, BUN 15, creatinine 1.1, glucose 119.  Hemoglobin 11.6, hematocrit 32.2, wbcs 15.7, platelets 194.  DISCHARGE CONDITION:  Stable.  CONSULTS:   None.  COMPLICATIONS:  None.  DISCHARGE DIAGNOSES: 1. Chest pain, no significant distal disease and patent grafts by    catheterization this admission. 2. Status post aortocoronary bypass in September 2000 with left internal    mammary artery to left anterior descending artery, saphenous vein graft    to ramus intermedius, saphenous vein graft to first obtuse marginal, and    saphenous vein graft to posterior descending artery. 3. Ischemic cardiomyopathy with an ejection fraction of 36% by catheterization    this admission. 4. Hyperlipidemia. 5. Hypertension. 6. Status post hernia repair and bladder cyst removal, as well as    tonsillectomy. 7. History of congestive heart failure in June 2001.  DISCHARGE INSTRUCTIONS:  ACTIVITY:  He is to do no driving, no sexual or strenuous activity for two days.  DIET:  He is to stick to a low fat and salt diet.  WOUND CARE:  He is to call the office for bleeding, swelling, or drainage at the catheterization site.  SPECIAL INSTRUCTIONS:  He is to get a BMET in one week.  FOLLOW-UP:  He is to follow up Dr. Linna Darner as needed.  He is to follow up with Dr. Stanford Breed and has a P.A. appointment on October 17 at noon.  DISCHARGE MEDICATIONS: 1. Lasix 40 mg one-and-a-half tablets p.o. q.a.m., one tablet q.p.m. 2. Lopressor 50 mg one-half tablet b.i.d. 3. Diovan 80  mg q.d. 4. Lipitor 40 mg q.d. 5. Coated aspirin 325 mg q.d. 6. Aldactone 25 mg q.d. DD:  09/09/00 TD:  09/09/00 Job: [id number]

## 2011-04-25 NOTE — Assessment & Plan Note (Signed)
[place] OFFICE NOTE   Mason Arnold, Mason Arnold                     MRN:          [id number]  DATE:12/31/2006                            DOB:          1941-03-12    HISTORY OF PRESENT ILLNESS:  [name] is seen. He is status post  ICD implantation for primary prevention of ischemic heart disease. He  has had recurrent ventricular tachycardia. He has no complaints of chest  pain or shortness of breath. In fact, the last year has been the best  year that he has had in a long time except for gout. Apparently, he is  not eligible for any medications for his gout because of his Coumadin  therapy.   PHYSICAL EXAMINATION:  VITAL SIGNS:  Blood pressure today was 126/76,  pulse of 60.  LUNGS:  Clear.  HEART:  Sounds were regular.  NECK:  Veins were flat.  EXTREMITIES:  Without edema.   PACEMAKER INTERROGATION:  Interrogation of his device demonstrates a  Vitality ER with impedance of 547-VA, 821 in the V, P-wave of 4.4, with  an R-wave of 24.9, threshold of 0.8 and 0.5 in voltage chamber. Battery  voltage was 3.04.   IMPRESSION:  1. Ischemic heart disease.      a.     Status post bypass grafting.      b.     Depressed left ventricular function.  2. Status post ICD for the above.  3. History of ventricular tachycardia with appropriate therapy.  4. Class II congestive heart failure.  5. Diabetes.   DISPOSITION:  [name] is stable currently. We will plan to see him  again in 1 years time and he will be begun on remote followup with  latitude.     Deboraha Sprang, MD, [facility]  Electronically Signed    SCK/MedQ  DD: 12/31/2006  DT: 12/31/2006  Job #: [id number]   cc:   Darrick Penna. Linna Darner, MD,FACP,FCCP

## 2011-04-25 NOTE — Cardiovascular Report (Signed)
[place]. [facility]  Patient:    ROMYN, ABEYTA                     MRN: [id number] Proc. Date: 09/08/00 Adm. Date:  [id number] Attending:  Lily Lovings CC:         CT lab  Denice Bors. Stanford Breed, M.D. [facility]  Darrick Penna. Linna Darner, M.D. [facility]   Cardiac Catheterization  INDICATIONS:  Mr. Lamm is a 70 year old who underwent emergent coronary artery bypass surgery.  He had LV dysfunction at that time due to an anterior wall MI.  He had to be put on an balloon pump.  He now is stabilized, but has had heart failure symptoms.  He also has had some chest pain.  He was brought back to the cath lab for further evaluation.  PROCEDURE: 1. Left heart catheterization. 2. Selective coronary arteriography. 3. Selective left ventriculography. 4. Saphenous vein graft angiography x 3. 5. Selective left internal mammary angiography.  DESCRIPTION OF PROCEDURE:  The procedure was performed from the right femoral artery using 6 French catheters.  He tolerated the procedure without complication.  HEMODYNAMIC DATA:  The central aortic pressure was 101/68, LV pressure was 103/31, there was no gradient on pullback across the aortic valve.  ANGIOGRAPHIC DATA:  Left main coronary artery was free of critical disease.  The LAD has a long 90% stenosis prior to the diagonal and septal.  The internal mammary to the distal LAD is patent.  The circumflex provides the ramus intermedius with a 90% proximal stenosis prior to a bifurcation.  The saphenous vein graft to the intermediate is a small caliber graft, but is smooth throughout and fills the intermediate well.  The AV circumflex has 70% stenosis.  The distal AC circumflex is occluded.  The saphenous vein graft to the OM is widely patent.  It does fill the distal circumflex in retrograde fashion.  The right coronary artery demonstrates tandem 70% stenosis and then is totally occluded.  The saphenous vein graft  to the PDA is widely patent.  There is about 50% narrowing before and after the graft insertion.  Ventriculography in the RAO projection reveals akinesis in the mid and distal anterolateral wall and apex.  The distal inferior wall is also akinetic.  The other segments move well.  Ejection fraction is calculated at 36%.  CONCLUSIONS: 1. Moderately severe reduction in global left ventricular function with    ejection fraction of 36%. 2. Patent internal mammary to the LAD, patent saphenous vein graft to the    intermediate, OM, and PDA.  DISPOSITION:  The patient has an elevated LVEDP.  His systolic pressure is low.  He has evidence of significant LV dysfunction.  This may well be causing his symptoms.  I have discussed the case with Dr. Stanford Breed, and he will try to optimize his medical therapy for heart failure. DD:  09/08/00 TD:  09/08/00 Job: [id number]

## 2011-04-25 NOTE — Discharge Summary (Signed)
[place]. [facility]  Patient:    Mason Arnold                      MRN: [id number] Adm. Date:  [id number] Disc. Date: 09/20/99 Attending:  Lily Lovings Dictator:   Shelle Iron, P.A.                           Discharge Summary  DATE OF BIRTH:  1941-04-06  END OF DICTATION. DD:  09/19/99 TD:  09/21/99 Job: [id number]

## 2011-04-25 NOTE — Procedures (Signed)
[place]. [facility]  Patient:    Mason Arnold, Mason Arnold                     MRN: [id number] Proc. Date: 04/19/01 Adm. Date:  [id number] Attending:  Fatima Sanger                           Procedure Report  PREOPERATIVE DIAGNOSES:  Ventricular tachycardia, ischemic cardiomyopathy, ejection fraction of 27%, status post bypass surgery with patent grafts.  POSTOPERATIVE DIAGNOSES:  Ventricular tachycardia, ischemic cardiomyopathy, ejection fraction of 27%, status post bypass surgery with patent grafts.  PROCEDURE:  Defibrillator implantation with contrast venography and intraoperative defibrillation threshold testing.  DESCRIPTION OF PROCEDURE:  Following the obtaining of informed consent, the patient was brought to the electrophysiology laboratory and placed on the fluoroscopic table in supine position.  The patient was submitted to general anesthesia under the care of Sharolyn Douglas, M.D.  Following routine prep and drape, intravenous contrast was injected via the left antecubital vein to identify the course and the patency of the extrathoracic left subclavian vein. This having been accomplished, incision was made and carried down to the level of the prepectoral fascia using electrocautery.  A pocket was formed using electrocautery.  Hemostasis was obtained.  Thereafter, attention was turned to gaining access to the extrathoracic left subclavian vein, which was accomplished without difficulty without the aspiration of air or puncture of the artery.  Two separate venipunctures were accomplished.  Two separate guidewires were placed and retained, and a 0 silk suture was placed in a figure-of-eight fashion and allowed to hang loosely.  Sequentially, a 9 [place] and 7 [place] tear-away introducer sheath were placed, through which were passed a Guidant model 0148 dual-coil defibrillator lead, serial number [id number], and a Medtronic 5076 active-fixation atrial  lead, 52 cm length, serial number [id number].  Under fluoroscopic guidance, these were manipulated into the right ventricular apex and the right atrial appendage, respectively, where the bipolar R-wave was 25 millivolts with a pacing impedance of 814 Ohms, a pacing threshold of 0.5 milliseconds at 0.6 volts, a currented threshold of 0.7 MA, and there was no diaphragmatic pacing at 10 volts.  The bipolar P-wave was 4.2 millivolts with a pacing impedance of 550 Ohms and a pacing threshold of 0.5 milliseconds at 0.9 volts and a currented threshold of 1.6 MA.  With these acceptable parameters recorded, the leads were then attached to a [place], model 1861, serial number [id number].  The bipolar R-wave as 21 millivolts with a pacing impedance of 982 Ohms and a pacing threshold of 1 volt at 0.5 milliseconds.  The P-wave was 1.4 millivolts with a pacing impedance of 592 Ohms and a pacing threshold of 0.6 volts at 0.5 milliseconds. With these acceptable parameters recorded, defibrillation threshold testing was undertaken.  Ventricular fibrillation was induced via the T-wave shock.  After a total duration of 9 seconds, a 14-joule shock was delivered through a measured resistance of 38 Ohms, terminating ventricular fibrillation and restoring sinus rhythm.  After a wait of five to six minutes, ventricular fibrillation was reinduced via the T-wave shock.  After a total duration of 8.7 seconds, a 14-joule shock was again delivered through a measured resistance of 38 Ohms, terminating ventricular fibrillation and restoring sinus rhythm.  With these acceptable parameters recorded, the system was implanted.  The pocket was copiously irrigated with antibiotic-containing saline solution, an the leads and the  pulse generator were then placed in the pocket, secured to the prepectoral fascia.  Hemostasis was previously assured.  The defibrillator was secured to the prepectoral fascia, and  the wound was closed in three layers in the normal fashion.  The wound was washed, dried, and a benzoin and Steri-Strip dressing was then applied.  Needle counts, sponge counts, and instrument counts were correct at the end of the procedure according to the staff.  COMPLICATIONS:  None apparent.  Empiric anti-tachycardia pacing was programmed for zones of 360/300 milliseconds. DD:  04/19/01 TD:  04/20/01 Job: [id number]

## 2011-04-25 NOTE — Letter (Signed)
March 11, 2007    Denice Bors. Stanford Breed, MD, [place]. [address], [place] [address]   RE:  CHAKA, WOODROOF  MRN:  [id number]  /  DOB:  1941-02-26   Dear Aaron Edelman:   I saw [name] on April 3, complaining of increasing dyspnea for  2-1/2 weeks with exertion.  There appears to be an exacerbation of his  dyspnea with waist flexion.   Additionally, he has had slight edema.  He states that when he meditates  and listens to music the edema resolves, but with cardiovascular  exercise the edema will worsen.   He describes an itch/ache in the left upper quadrant at the site of  the defibrillator/pacemaker site but denies classic angina symptoms with  exertion.  The atypical pain occurs at rest and has no definite  triggers.   He has not been monitoring his diabetes because each time he does an  Accu-Check he comments, that's a dollar.  I pointed out that glucose  monitoring and diabetic control would certainly be much cheaper than a  hospital co-pay .   He states that he is restricting carbs and high-fructose corn syrup.   There has been no change in his past medical history with the most  recent hospitalizationforcongestive heart failure in January 2007.  He  is followed by the [facility].  Their graph indicates  a  very stable weight profile with none above 185.  At this time the  weight is 184.8, down 3.8 pounds.   Blood pressure was 120/84, respiratory rate was 15 and unlabored, and  pulse 64.  O2 saturations were 98% after two laps or 50 yards around the  perimeter of the office module perimeter.  He did drop to 96% with waist  flexion.  He had a grade 1 systolic murmur.  Chest was clear.  There was  no significant edema.  Pulses were intact.  No skin changes were noted.  He has no organomegaly or masses or hepatojugular reflux.  There was no  neck vein distention at 0 degrees.  Neuropsychiatric exam was  unremarkable except for  insight,  based on his comments concerning  saving money on glucose monitoring.   Chest x-ray showed no active disease; cardiopulmonary ratio was 15-30  cm.   It appears that some of his shortness of breath is related to a hiatal  hernia.   His recent labs reveal normal hepatic profile and controlled LDL of 67.  His triglycerides of 275 and HDL of 33 belie the statement that he is  restricting carbs and high-fructose corn syrup.  Additionally, A1c has  climbed from 6.2 in August 2007 to 6.4.  I have recommended the book,  The Flat Belly Diet, to him and his wife.  I have asked him to monitor  his sugars daily and bring that diary to me in 2-3 weeks.   BUN and creatinine were normal, as were the liver function tests.  His  uric acid is 10.  Recent reports indicate that high-fructose corn syrup-  containing foods can cause gout, and this information was communicated  to him.  Purine restriction will also be recommended.  Consideration  will be given to low-dose allopurinol.   As we were reviewing his chest x-ray together, he informed me that he  was under a great deal of stress.  Reluctantly, he has agreed to try  generic fluoxetine 10 mg daily; I will assess his response  when he is  seen in several weeks for follow-up of his diabetes.    Sincerely,      Darrick Penna. Linna Darner, MD,FACP,FCCP  Electronically Signed    WFH/MedQ  DD: 03/12/2007  DT: 03/12/2007  Job #: [id number]

## 2011-04-25 NOTE — Discharge Summary (Signed)
. [facility]  Patient:    Mason Arnold, Mason Arnold                     MRN: [id number] Adm. Date:  [id number] Disc. Date: [id number] Attending:  Fatima Sanger Dictator:   Arn Medal, P.A. CC:         Darrick Penna. Linna Darner, M.D. [facility]  Denice Bors. Stanford Breed, M.D. [facility]  Nikki Dom, M.D., [facility]   Discharge Summary  DISCHARGE DIAGNOSES: 1. Left ventricular tachycardia, status post automatic implantable    cardioverter/defibrillator implantation. 2. Hypotension. 3. Hypercholesterolemia. 4. Status post myocardial infarction in October 2000, with subsequent coronary    artery bypass graft x 4. 5. Congestive heart failure, ejection fraction 36% by test.  HISTORY OF PRESENT ILLNESS:  The patient presented to the emergency room complaining of palpitations which began early in the evening.  Per patient, this began during sexual activity.  He also reported some left-sided chest pain that radiated to both arms and was accompanied by nausea and diarrhea. He was seen and admitted by Dr. Eustace Quail.  In light of the patients history as well as his failure to convert with IV amiodarone, Dr. Olevia Perches felt that emergent cardioversion was necessary.  HOSPITAL COURSE:  Anesthesia was called who sedated and intubated the patient. Intubation was requested due to patients recent ingestion of food.  Dr. Olevia Perches proceeded to place the paddles in the AP position and administered a synchronized shock of 50 watt seconds.  This resulted in conversion to normal sinus rhythm.  The following morning, the patient saw Dr. Kirk Ruths.  He noted the patients heart rate remained between 45 and 55 and his blood pressure was 90/50.  He held the patients Lasix and planned to continue the amiodarone. His plan was to have the patient taken to the catheterization lab the following morning with pretreatment with Mucomyst.  If the grafts proved to be patent, then the patient would  need an AICD implanted.  The next day, the patient was taken to the catheterization lab by Dr. Bing Quarry. Enucleography revealed moderately severe reduction in the left ventricular function.  There was noted to be anterior apical hypokinesis and ejection fraction of approximately 26%.  There was also noted to be trace mitral regurgitation.  He noted that the left coronary artery was free of critical disease.  The LAD was totally occluded.  The intermediate had severe 98% osteal disease.  AV circumflex had 70% narrowing.  In the first marginal branch, there was a 90% narrowing.  Right coronary artery was severely diseased throughout the mid portion.  There was about 90% narrowing overriding the origin of the right nuclear branch.  Internal mammary graft to the LAD was patent.  Saphenous vein grafts to the intermediate, the obtuse marginal and posterior descending were patent.  His recommendation was to continue with electrophysiologic evaluation with probable AICD implantation.  Over the weekend, the patient was seen by Dr. Dola Argyle.  The patient did well with no further dysrhythmias.  His heart rate had increased somewhat into the mid 50s.  Blood pressure was stable at 100/64.  Dr. Ron Parker also noted that the patient may have a very mild viral URI.  On May 13, the patient was taken to the electrophysiology lab by Dr. Virl Axe.  A Guidant AICD with pacing capability was implanted without difficulty.  The next day, the patient was seen by Dr. Kirk Ruths.  The patient denied any chest  pain or shortness of breath.  The patients heart rate was 60 with a blood pressure of 110/70.  Of note, his amiodarone had been stopped. Dr. Stanford Breed felt that from his standpoint, the patient was stable for discharge.  Later that morning, Dr. Caryl Comes saw the patient.  Reviewing the postprocedure x-ray, this showed the leads are stable and in place.  From his standpoint, the patient was felt to be  stable for discharge.  DISCHARGE MEDICATIONS: 1. Lasix 60 mg q.a.m., 40 mg q.p.m. 2. Aldactone 25 mg q.a.m. 3. Toprol XL 12.5 mg 1/2 of a 25 mg tablet q.d. 4. Enteric coated aspirin 325 mg q.d. 5. Diovan 80 mg q.d. 6. Lipitor 40 mg in the evening. 7. Digoxin 0.125 mg q.d.  ACTIVITY:  Avoid lifting over 10 pounds until seen by Dr. Caryl Comes.  Avoid driving for six months or until cleared by Dr. Caryl Comes.  DIET:  Low fat, low cholesterol diet.  WOUND CARE:  He is to keep the wound site dry.  FOLLOWUP:  Follow up with Dr. Caryl Comes in the office on June 3, at 4:15 p.m. Follow up with Dr. Stanford Breed in the office on June 4, at 10:15 a.m.  He is to obtain a BMP test in the lab one week after discharge.  LABORATORY DATA AND X-RAY FINDINGS:  White count 11.3, hemoglobin 13.6, hematocrit 37.5, platelets 178.  Sodium 139, potassium 3.9, chloride 104, CO2 28, BUN 15, creatinine 1.2, glucose 116.  Postconversion electrocardiogram revealed sinus bradycardia in the 40s to 50s. There was also noted to be a left axis deviation and left bundle branch block. PR interval was 0.136, QRS 0.126, QTC 0.429, axis -50. DD:  04/20/01 TD:  04/21/01 Job: [id number]

## 2011-04-25 NOTE — Cardiovascular Report (Signed)
Mason Arnold, Mason Arnold            ACCOUNT NO.:  [id number]   MEDICAL RECORD NO.:  [id number]          PATIENT TYPE:  OIB   LOCATION:  6501                         FACILITY:  [facility]   PHYSICIAN:  Jenkins Rouge, M.D.     DATE OF BIRTH:  1941-01-19   DATE OF PROCEDURE:  05/12/2005  DATE OF DISCHARGE:                              CARDIAC CATHETERIZATION   PROCEDURE:  Coronary arteriography.   INDICATIONS:  Chest pain, known ischemic cardiomyopathy.   Standard catheterization was done with 4-French catheters from right femoral  artery.   Left main coronary artery had 30% tubular stenosis.   Left anterior descending was 100% occluded proximally.  Circumflex coronary  artery was 100% occluded in the mid vessel.  Right coronary artery was 100%  occluded in the mid vessel.   The left internal mammary artery was widely patent to the mid LAD.  The mid  and distal LAD itself were small, atretic vessels.   The saphenous vein graft to the diagonal branch was small, but widely  patent.  The saphenous vein graft to the obtuse marginal branch was normal.  The saphenous vein graft to the PDA was widely patent and normal.   RAO VENTRICULOGRAPHY:  RAO ventriculography showed global hypokinesis with  ejection fraction in the 15-20% range.  There was no gradient across the  aortic valve.  The pressure was 115/10 with a post A-wave EDP of 29.  Aortic  pressure was 117/71.   IMPRESSION:  Patient's chest pain would appear to be noncardiac.  He  certainly has a severe ischemic cardiomyopathy.  His QRS on his EKG was 144  milliseconds but apparently when his generator was upgraded he was not  thought to be a candidate for bi-V pacing.   The patient will follow up with Dr. Stanford Breed and Dr. Caryl Comes and have  continued medical therapy.      PN/MEDQ  D:  05/12/2005  T:  05/12/2005  Job:  [id number]   cc:   Kirk Ruths, M.D. [facility]   Darrick Penna. Linna Darner, M.D. [facility]

## 2011-05-01 ENCOUNTER — Ambulatory Visit (INDEPENDENT_AMBULATORY_CARE_PROVIDER_SITE_OTHER): Payer: Medicare Other | Admitting: *Deleted

## 2011-05-01 DIAGNOSIS — I4891 Unspecified atrial fibrillation: Secondary | ICD-10-CM

## 2011-05-15 ENCOUNTER — Other Ambulatory Visit: Payer: Self-pay | Admitting: Internal Medicine

## 2011-05-15 NOTE — Telephone Encounter (Signed)
CPX Due

## 2011-05-29 ENCOUNTER — Ambulatory Visit (INDEPENDENT_AMBULATORY_CARE_PROVIDER_SITE_OTHER): Payer: Medicare Other | Admitting: *Deleted

## 2011-05-29 DIAGNOSIS — I4891 Unspecified atrial fibrillation: Secondary | ICD-10-CM

## 2011-05-29 LAB — POCT INR: INR: 2.1

## 2011-05-29 MED ORDER — WARFARIN SODIUM 5 MG PO TABS: ORAL_TABLET | ORAL | Status: DC

## 2011-06-26 ENCOUNTER — Ambulatory Visit (INDEPENDENT_AMBULATORY_CARE_PROVIDER_SITE_OTHER): Payer: Medicare Other | Admitting: *Deleted

## 2011-06-26 DIAGNOSIS — I4891 Unspecified atrial fibrillation: Secondary | ICD-10-CM

## 2011-06-26 LAB — POCT INR: INR: 2.2

## 2011-07-03 ENCOUNTER — Ambulatory Visit (INDEPENDENT_AMBULATORY_CARE_PROVIDER_SITE_OTHER): Payer: Medicare Other | Admitting: *Deleted

## 2011-07-03 DIAGNOSIS — I4891 Unspecified atrial fibrillation: Secondary | ICD-10-CM

## 2011-07-03 DIAGNOSIS — I472 Ventricular tachycardia: Secondary | ICD-10-CM

## 2011-07-04 ENCOUNTER — Other Ambulatory Visit: Payer: Self-pay | Admitting: Internal Medicine

## 2011-07-04 LAB — REMOTE ICD DEVICE
AL IMPEDENCE ICD: 440 Ohm
ATRIAL PACING ICD: 100 pct
HV IMPEDENCE: 43 Ohm
RV LEAD AMPLITUDE: 12 mv
TZAT-0013SLOWVT: 3
TZAT-0018FASTVT: NEGATIVE
TZAT-0018SLOWVT: NEGATIVE
TZAT-0019FASTVT: 7.5 V
TZON-0003FASTVT: 285 ms
TZON-0003SLOWVT: 350 ms
TZON-0004FASTVT: 24
TZON-0005SLOWVT: 6
TZON-0010FASTVT: 80 ms
TZST-0001FASTVT: 2
TZST-0001FASTVT: 4
TZST-0001SLOWVT: 2
TZST-0001SLOWVT: 5
TZST-0003FASTVT: 40 J
TZST-0003FASTVT: 40 J
TZST-0003SLOWVT: 30 J
VENTRICULAR PACING ICD: 1.6 pct

## 2011-07-08 NOTE — Progress Notes (Signed)
icd remote check  

## 2011-07-11 ENCOUNTER — Encounter: Payer: Self-pay | Admitting: *Deleted

## 2011-07-24 ENCOUNTER — Ambulatory Visit (INDEPENDENT_AMBULATORY_CARE_PROVIDER_SITE_OTHER): Payer: Medicare Other | Admitting: *Deleted

## 2011-07-24 DIAGNOSIS — I4891 Unspecified atrial fibrillation: Secondary | ICD-10-CM

## 2011-07-24 LAB — POCT INR: INR: 2.2

## 2011-08-14 ENCOUNTER — Other Ambulatory Visit: Payer: Self-pay | Admitting: Internal Medicine

## 2011-08-14 NOTE — Telephone Encounter (Signed)
Patient needs to schedule a CPX  

## 2011-08-21 ENCOUNTER — Ambulatory Visit (INDEPENDENT_AMBULATORY_CARE_PROVIDER_SITE_OTHER): Payer: Medicare Other | Admitting: *Deleted

## 2011-08-21 DIAGNOSIS — I4891 Unspecified atrial fibrillation: Secondary | ICD-10-CM

## 2011-08-21 LAB — POCT INR: INR: 3.2

## 2011-09-15 ENCOUNTER — Telehealth: Payer: Self-pay | Admitting: Internal Medicine

## 2011-09-15 ENCOUNTER — Inpatient Hospital Stay (HOSPITAL_COMMUNITY)
Admission: EM | Admit: 2011-09-15 | Discharge: 2011-09-18 | DRG: 291 | Disposition: A | Discharge status: Home / Self Care | Payer: Medicare Other | Source: Ambulatory Visit | Attending: Family Medicine | Admitting: Family Medicine

## 2011-09-15 ENCOUNTER — Emergency Department (HOSPITAL_COMMUNITY): Payer: Medicare Other

## 2011-09-15 DIAGNOSIS — I2589 Other forms of chronic ischemic heart disease: Secondary | ICD-10-CM | POA: Diagnosis present

## 2011-09-15 DIAGNOSIS — M109 Gout, unspecified: Secondary | ICD-10-CM | POA: Diagnosis present

## 2011-09-15 DIAGNOSIS — E8881 Metabolic syndrome: Secondary | ICD-10-CM | POA: Diagnosis present

## 2011-09-15 DIAGNOSIS — I4891 Unspecified atrial fibrillation: Secondary | ICD-10-CM | POA: Diagnosis present

## 2011-09-15 DIAGNOSIS — I2510 Atherosclerotic heart disease of native coronary artery without angina pectoris: Secondary | ICD-10-CM | POA: Diagnosis present

## 2011-09-15 DIAGNOSIS — J189 Pneumonia, unspecified organism: Secondary | ICD-10-CM | POA: Diagnosis present

## 2011-09-15 DIAGNOSIS — I10 Essential (primary) hypertension: Secondary | ICD-10-CM | POA: Diagnosis present

## 2011-09-15 DIAGNOSIS — E785 Hyperlipidemia, unspecified: Secondary | ICD-10-CM | POA: Diagnosis present

## 2011-09-15 DIAGNOSIS — Z79899 Other long term (current) drug therapy: Secondary | ICD-10-CM

## 2011-09-15 DIAGNOSIS — I509 Heart failure, unspecified: Secondary | ICD-10-CM | POA: Diagnosis present

## 2011-09-15 DIAGNOSIS — E119 Type 2 diabetes mellitus without complications: Secondary | ICD-10-CM | POA: Diagnosis present

## 2011-09-15 DIAGNOSIS — I517 Cardiomegaly: Secondary | ICD-10-CM | POA: Diagnosis present

## 2011-09-15 DIAGNOSIS — Z23 Encounter for immunization: Secondary | ICD-10-CM

## 2011-09-15 DIAGNOSIS — I252 Old myocardial infarction: Secondary | ICD-10-CM

## 2011-09-15 DIAGNOSIS — Z95810 Presence of automatic (implantable) cardiac defibrillator: Secondary | ICD-10-CM

## 2011-09-15 DIAGNOSIS — Z794 Long term (current) use of insulin: Secondary | ICD-10-CM

## 2011-09-15 DIAGNOSIS — Z7901 Long term (current) use of anticoagulants: Secondary | ICD-10-CM

## 2011-09-15 DIAGNOSIS — I5023 Acute on chronic systolic (congestive) heart failure: Principal | ICD-10-CM | POA: Diagnosis present

## 2011-09-15 DIAGNOSIS — I447 Left bundle-branch block, unspecified: Secondary | ICD-10-CM | POA: Diagnosis present

## 2011-09-15 DIAGNOSIS — Z7982 Long term (current) use of aspirin: Secondary | ICD-10-CM

## 2011-09-15 LAB — BASIC METABOLIC PANEL
BUN: 19 mg/dL (ref 6–23)
CO2: 29 mEq/L (ref 19–32)
Chloride: 95 mEq/L — ABNORMAL LOW (ref 96–112)
Creatinine, Ser: 1.19 mg/dL (ref 0.50–1.35)
GFR calc Af Amer: 69 mL/min — ABNORMAL LOW (ref >90)
Potassium: 4 mEq/L (ref 3.5–5.1)

## 2011-09-15 LAB — DIFFERENTIAL
Basophils Absolute: 0 10*3/uL (ref 0.0–0.1)
Lymphocytes Relative: 7 % — ABNORMAL LOW (ref 12–46)
Lymphs Abs: 1.4 10*3/uL (ref 0.7–4.0)
Neutro Abs: 16.1 10*3/uL — ABNORMAL HIGH (ref 1.7–7.7)
Neutrophils Relative %: 85 % — ABNORMAL HIGH (ref 43–77)

## 2011-09-15 LAB — CBC
HCT: 40.8 % (ref 39.0–52.0)
Hemoglobin: 14.6 g/dL (ref 13.0–17.0)
MCV: 90.1 fL (ref 78.0–100.0)
RBC: 4.53 MIL/uL (ref 4.22–5.81)
RDW: 13.7 % (ref 11.5–15.5)
WBC: 18.8 10*3/uL — ABNORMAL HIGH (ref 4.0–10.5)

## 2011-09-15 LAB — PROTIME-INR: INR: 1.79 — ABNORMAL HIGH (ref 0.00–1.49)

## 2011-09-15 LAB — DIGOXIN LEVEL: Digoxin Level: 0.5 ng/mL — ABNORMAL LOW (ref 0.8–2.0)

## 2011-09-15 LAB — POCT I-STAT TROPONIN I

## 2011-09-15 MED ORDER — DIGOXIN 125 MCG PO TABS: 125.0000 ug | ORAL_TABLET | Freq: Every day | ORAL | Status: DC

## 2011-09-15 MED ORDER — FUROSEMIDE 40 MG PO TABS: 40.0000 mg | ORAL_TABLET | Freq: Two times a day (BID) | ORAL | Status: DC

## 2011-09-15 NOTE — Telephone Encounter (Signed)
I spoke with pt and he is out of his furosemide and digoxin.  Refill sent in. He has been out of these since last Friday and does have weight gain of 10 pounds. He is not in distress "just a little short winded". Horton Chin RN

## 2011-09-15 NOTE — Telephone Encounter (Signed)
Pt didn't take furosimide on Friday, ran out and gets med through the [place], they are closed today, he has retained so much fluid he has gained 10 lbs, pls advise

## 2011-09-16 ENCOUNTER — Inpatient Hospital Stay (HOSPITAL_COMMUNITY): Payer: Medicare Other

## 2011-09-16 LAB — COMPREHENSIVE METABOLIC PANEL
ALT: 20 U/L (ref 0–53)
AST: 21 U/L (ref 0–37)
Albumin: 3.8 g/dL (ref 3.5–5.2)
Alkaline Phosphatase: 49 U/L (ref 39–117)
BUN: 18 mg/dL (ref 6–23)
CO2: 31 mEq/L (ref 19–32)
Calcium: 9.2 mg/dL (ref 8.4–10.5)
Chloride: 98 mEq/L (ref 96–112)
Creatinine, Ser: 1.22 mg/dL (ref 0.50–1.35)
GFR calc Af Amer: 67 mL/min — ABNORMAL LOW (ref >90)
GFR calc non Af Amer: 58 mL/min — ABNORMAL LOW (ref >90)
Glucose, Bld: 80 mg/dL (ref 70–99)
Potassium: 3.8 mEq/L (ref 3.5–5.1)
Sodium: 139 mEq/L (ref 135–145)
Total Bilirubin: 1.2 mg/dL (ref 0.3–1.2)
Total Protein: 7.5 g/dL (ref 6.0–8.3)

## 2011-09-16 LAB — TSH: TSH: 2.663 u[IU]/mL (ref 0.350–4.500)

## 2011-09-16 LAB — GLUCOSE, CAPILLARY
Glucose-Capillary: 94 mg/dL (ref 70–99)
Glucose-Capillary: 150 mg/dL — ABNORMAL HIGH (ref 70–99)
Glucose-Capillary: 141 mg/dL — ABNORMAL HIGH (ref 70–99)
Glucose-Capillary: 114 mg/dL — ABNORMAL HIGH (ref 70–99)

## 2011-09-16 LAB — CBC
Hemoglobin: 13.6 g/dL (ref 13.0–17.0)
MCH: 31.6 pg (ref 26.0–34.0)
MCHC: 35.4 g/dL (ref 30.0–36.0)
Platelets: 140 10*3/uL — ABNORMAL LOW (ref 150–400)
RBC: 4.31 MIL/uL (ref 4.22–5.81)

## 2011-09-16 LAB — HEMOGLOBIN A1C
Hgb A1c MFr Bld: 6.2 % — ABNORMAL HIGH (ref <5.7)
Mean Plasma Glucose: 131 mg/dL — ABNORMAL HIGH (ref <117)

## 2011-09-16 LAB — MAGNESIUM: Magnesium: 2.6 mg/dL — ABNORMAL HIGH (ref 1.5–2.5)

## 2011-09-17 ENCOUNTER — Telehealth: Payer: Self-pay | Admitting: Cardiology

## 2011-09-17 LAB — PRO B NATRIURETIC PEPTIDE: Pro B Natriuretic peptide (BNP): 1104 pg/mL — ABNORMAL HIGH (ref 0–125)

## 2011-09-17 LAB — BASIC METABOLIC PANEL
CO2: 29 mEq/L (ref 19–32)
Calcium: 9.4 mg/dL (ref 8.4–10.5)
Chloride: 98 mEq/L (ref 96–112)
Glucose, Bld: 85 mg/dL (ref 70–99)
Potassium: 4.2 mEq/L (ref 3.5–5.1)
Sodium: 138 mEq/L (ref 135–145)

## 2011-09-17 LAB — GLUCOSE, CAPILLARY
Glucose-Capillary: 119 mg/dL — ABNORMAL HIGH (ref 70–99)
Glucose-Capillary: 141 mg/dL — ABNORMAL HIGH (ref 70–99)
Glucose-Capillary: 143 mg/dL — ABNORMAL HIGH (ref 70–99)

## 2011-09-17 LAB — PROTIME-INR: INR: 1.83 — ABNORMAL HIGH (ref 0.00–1.49)

## 2011-09-17 NOTE — Telephone Encounter (Signed)
Spoke with pt wife, pt was admitted to the hosp on Monday with question of chf or pneumonia. His wife wants to make sure we get involved in his care. Explained to pt wife we will need to be consulted and she would need to ask the attending to ask for [place] to see the pt. She is going to do that. Fredia Beets

## 2011-09-17 NOTE — Telephone Encounter (Signed)
Pt's wife called. Pt is in hospital she wanted to talk to you

## 2011-09-17 NOTE — Telephone Encounter (Signed)
Wife has returned your call/doing ultrasound on leg/possible gout.  INR 1.8/  She will be waiting on call.

## 2011-09-17 NOTE — H&P (Signed)
NAMEHENNESSY, ADA NO.:  [id number]  MEDICAL RECORD NO.:  [id number]  LOCATION:  [id number]                         FACILITY:  [facility]  PHYSICIAN:  Jacquelynn Cree, M.D.   DATE OF BIRTH:  Jul 07, 1941  DATE OF ADMISSION:  09/15/2011 DATE OF DISCHARGE:                             HISTORY & PHYSICAL   PRIMARY CARE PHYSICIAN:  Darrick Penna. Linna Darner, MD, FACP, FCCP  CARDIOLOGIST:  Deboraha Sprang, MD, [facility]  CHIEF COMPLAINT:  Cough, dyspnea, and chills.  HISTORY OF PRESENT ILLNESS:  The patient is a 70 year old male with past medical history of coronary artery disease and chronic ischemic cardiomyopathy with chronic congestive heart failure, who presents to the hospital with 2-day history of increasing dyspnea.  The patient's symptoms started after he ran out of his Lasix.  The patient states that over the past 2 days, he has also gained 10 pounds.  He notices a cough with some sputum production, unable to characterize it as he does not look what he expectorates, but he says it is non tenacious.  He also reports 2-3 pillow orthopnea.  Denies any sick contacts.  He has not yet received his flu vaccination for this season, but has received the Pneumovax.  He has had some low-grade fevers and loss of appetite.  He has had some nonexertional chest pain, located under the left breast, lasting about 20 minutes but none currently.  No aggravating or alleviating factors.  Upon initial evaluation in the emergency department, the patient was found to be febrile with leukocytosis and evidence for possible pneumonia noted on chest radiography and has been referred to the Hospitalist Service for further evaluation and treatment.  PAST MEDICAL HISTORY: 1. Chronic systolic congestive heart failure, last EF 27%, status post     ICD placement due to history of ventricular tachycardia. 2. Coronary artery disease, status post MI x4. 3. Hyperlipidemia. 4. Paroxysmal atrial  fibrillation. 5. Type 2 diabetes. 6. Hypertension. 7. Metabolic syndrome. 8. Gout. 9. Left bundle-branch block.  PAST SURGICAL HISTORY: 1. Coronary artery bypass graft. 2. Inguinal hernia repair. 3. Urachal cyst resection. 4. Tonsillectomy.  FAMILY HISTORY:  The patient's father died at 64 from an MI and congestive heart failure.  The patient's mother died at 32.  He has 1 brother who died at the age of 29 from brain aneurysm.  He has 4 living siblings.  One brother is 53 and is healthy.  One sister is 41 and has dementia.  One sister is 57 and has breast cancer.  One sister is 40 and is healthy.  SOCIAL HISTORY:  The patient is married.  He is retired/disabled.  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 He is a lifelong nonsmoker.  He drinks alcohol socially.  No drugs.  ALLERGIES: 1. AMPHOTERICIN B. 2. ERYTHROMYCIN. 3. CRESTOR. 4. AMPICILLIN. 5. DARVOCET. Use caution with ACE inhibitors due to the propensity for hypotension on these medicines.  CURRENT MEDICATIONS: 1. Lanoxin 0.125 mg p.o. daily. 2. Lasix 40 mg p.o. b.i.d. 3. Fluoxetine 10 mg p.o. daily. 4. Allopurinol 100 mg p.o. daily. 5. Coreg 12.5 mg p.o. b.i.d. 6. Ramipril 2.5 mg p.o. daily. 7. Spironolactone 25 mg  p.o. at bedtime. 8. Coumadin 5 mg alternating with 2.5 mg p.o. daily. 9. Aspirin 81 mg p.o. daily. 10.Lantus 100 units subcutaneously at bedtime. 11.Simvastatin 40 mg p.o. daily. 12.Slo-Niacin 1500 mg p.o. at bedtime.  REVIEW OF SYSTEMS:  CONSTITUTIONAL:  Positive for fever, loss of appetite, and a 10-pound weight gain over the past 48-72 hours.  HEENT: No complaints.  CARDIOVASCULAR:  Positive for chest pain as described in the HPI.  No arrhythmia.  RESPIRATORY:  Positive for progressive dyspnea, orthopnea, and cough.  GI:  No nausea, vomiting, diarrhea, melena, or hematochezia.  GU:  No dysuria or hematuria. MUSCULOSKELETAL:  Mild myalgias.  NEUROLOGIC:  No  complaints.  Comprehensive 14-point review of systems is otherwise unremarkable.  PHYSICAL EXAMINATION:  VITAL SIGNS:  Temperature 99.7, pulse 60, respirations 26, blood pressure 118/64, O2 saturation 92% on 2 L. GENERAL:  Well-developed, well-nourished male in no acute distress. HEENT:  Normocephalic, atraumatic.  PERRL.  EOMI.  Oropharynx is clear. NECK:  Supple, no thyromegaly, no lymphadenopathy, no jugular venous distention. CHEST:  Lungs are clear to auscultation bilaterally with good air movement. HEART:  Regular rate, rhythm.  No murmurs, rubs, or gallops. ABDOMEN:  Soft, nontender, and nondistended with normoactive bowel sounds. EXTREMITIES:  No significant edema.  No clubbing or cyanosis. SKIN:  Warm and Dry.  No rashes. NEUROLOGIC:  The patient is alert and oriented x3.  Cranial nerves II through XII grossly intact.  Nonfocal.  DATA REVIEW:  Chest x-ray shows cardiomegaly with central vascular congestion and mild perihilar opacity.  Questionable pulmonary edema pattern.  LABORATORY DATA:  Digoxin is 0.5.  PT 21.1 with an INR of 1.79.  ProBNP is 2046.  Sodium is 136, potassium 4.0, chloride 95, bicarb 29, BUN 19, creatinine 1.19, glucose 140, calcium 9.8.  Troponin is 0.02.  White blood cell count is 18.8, hemoglobin 14.6, hematocrit 40.8, platelets 158 with an absolute neutrophil count of 16.1.  ASSESSMENT AND PLAN: 1. Community-acquired pneumonia:  The patient does have a     constellation of symptoms and signs suggestive of possible     community-acquired pneumonia.  We will attempt to obtain sputum     cultures and start him empirically on IV Avelox.  He has already     received the Pneumovax.  He does need a flu shot, which we will     give him during this hospitalization. 2. Acute on chronic systolic congestive heart failure:  Exacerbated by     medical nonadherence.  We will give the patient a dose of IV Lasix     and put him back on his usual diuretic therapy  with Lasix 40 mg     p.o. b.i.d. as well as spironolactone 25 mg p.o. at bedtime.     Monitor daily weights and strict intake and output records and     adjust his medications if needed. 3. Hyperlipidemia:  Continue the patient's statin therapy as well as     Slo-Niacin. 4. Paroxysmal atrial fibrillation:  The patient currently appears to     be in normal sinus rhythm.  We will continue Coumadin per pharmacy     dosing since his INR is currently slightly subtherapeutic.  We will     continue his usual home medications for rate control including     Coreg and digoxin. 5. Diabetes:  We will check the patient's hemoglobin A1c, continue his     current dose of Lantus.  We will add sliding scale insulin as well.  6. Hypertension:  Resume all the patient's home medications and     monitor his blood pressure closely. 7. Prophylaxis:  The patient is currently on Coumadin and therefore we     will not add additional blood thinners.  We will have pharmacy dose     of his Coumadin, so that he achieves a therapeutic INR. Time spent on admission including face-to-face time is approximately 1 hour.     Jacquelynn Cree, M.D.     CR/MEDQ  D:  09/15/2011  T:  09/16/2011  Job:  [id number]  cc:   Darrick Penna. Linna Darner, MD,FACP,FCCP Deboraha Sprang, MD, [facility]  Electronically Signed by Jacquelynn Cree M.D. on 09/17/2011 01:11:20 PM

## 2011-09-18 ENCOUNTER — Encounter: Payer: Medicare Other | Admitting: *Deleted

## 2011-09-18 DIAGNOSIS — M79609 Pain in unspecified limb: Secondary | ICD-10-CM

## 2011-09-18 LAB — POCT CARDIAC MARKERS
Myoglobin, poc: 113
Operator id: 198171
Troponin i, poc: <0.05

## 2011-09-18 LAB — EXPECTORATED SPUTUM ASSESSMENT W GRAM STAIN, RFLX TO RESP C

## 2011-09-18 LAB — I-STAT 8, (EC8 V) (CONVERTED LAB)
Acid-Base Excess: 3 — ABNORMAL HIGH
BUN: 20
Chloride: 100
Potassium: 3.8
pH, Ven: 7.472 — ABNORMAL HIGH

## 2011-09-18 LAB — BASIC METABOLIC PANEL
BUN: 24 mg/dL — ABNORMAL HIGH (ref 6–23)
CO2: 26 mEq/L (ref 19–32)
Calcium: 8.8 mg/dL (ref 8.4–10.5)
Creatinine, Ser: 1.3 mg/dL (ref 0.50–1.35)
GFR calc non Af Amer: 54 mL/min — ABNORMAL LOW (ref >90)
Glucose, Bld: 125 mg/dL — ABNORMAL HIGH (ref 70–99)
Sodium: 134 mEq/L — ABNORMAL LOW (ref 135–145)

## 2011-09-18 LAB — PROTIME-INR
INR: 2.54 — ABNORMAL HIGH (ref 0.00–1.49)
INR: 2.9 — ABNORMAL HIGH
Prothrombin Time: 31 — ABNORMAL HIGH

## 2011-09-18 LAB — LIPID PANEL
LDL Cholesterol: 54
Total CHOL/HDL Ratio: 5.2
Triglycerides: 301 — ABNORMAL HIGH
VLDL: 60 — ABNORMAL HIGH

## 2011-09-18 LAB — CK TOTAL AND CKMB (NOT AT ARMC)
Relative Index: 1.4
Total CK: 222

## 2011-09-18 LAB — CARDIAC PANEL(CRET KIN+CKTOT+MB+TROPI)
CK, MB: 3
Relative Index: 1.5

## 2011-09-18 LAB — CBC
HCT: 39.5
Hemoglobin: 12.6 g/dL — ABNORMAL LOW (ref 13.0–17.0)
MCH: 32 pg (ref 26.0–34.0)
MCV: 89
Platelets: 201
RBC: 3.94 MIL/uL — ABNORMAL LOW (ref 4.22–5.81)
RDW: 13.2

## 2011-09-18 LAB — POCT I-STAT CREATININE: Creatinine, Ser: 1

## 2011-09-18 LAB — COMPREHENSIVE METABOLIC PANEL
BUN: 17
CO2: 30
Chloride: 99
Creatinine, Ser: 1
GFR calc non Af Amer: >60
Total Bilirubin: 1

## 2011-09-18 LAB — DIFFERENTIAL
Basophils Absolute: 0
Lymphocytes Relative: 21
Neutro Abs: 5.9
Neutrophils Relative %: 67

## 2011-09-18 LAB — GLUCOSE, CAPILLARY

## 2011-09-18 LAB — APTT: aPTT: 42 — ABNORMAL HIGH

## 2011-09-18 LAB — TROPONIN I: Troponin I: 0.01

## 2011-10-06 NOTE — Discharge Summary (Signed)
Mason Arnold, Mason Arnold            ACCOUNT NO.:  [id number]  MEDICAL RECORD NO.:  [id number]  LOCATION:  [id number]                         FACILITY:  [facility]  PHYSICIAN:  Eleonore Chiquito, MD         DATE OF BIRTH:  Sep 16, 1941  DATE OF ADMISSION:  09/15/2011 DATE OF DISCHARGE:  09/18/2011                              DISCHARGE SUMMARY   ADMISSION DIAGNOSES: 1. Community-acquired pneumonia. 2. Acute on chronic systolic congestive heart failure. 3. Hyperlipidemia. 4. Atrial fibrillation. 5. Diabetes mellitus. 6. Hypertension.  DISCHARGE DIAGNOSES: 1. Community-acquired pneumonia, improving. 2. Chronic systolic congestive heart failure, currently compensated. 3. Hyperlipidemia. 4. Atrial fibrillation. 5. Gout. 6. Diabetes mellitus.  TESTS PERFORMED DURING THE HOSPITAL STAY:  Chest x-ray on September 15, 2011, showed cardiomegaly with central congestion and mild perihilar opacity present in pulmonary edema pattern.  White count on the day of admission was 18,000, as of September 18, 2011, white count has come down to 14 1000.  BRIEF HISTORY AND PHYSICAL:  This is a 70 year old male with a past medical history of CAD and chronic ischemic cardiomyopathy and chronic congestive heart failure, who came with a 2-day history of increasing dyspnea.  The patient says that these symptoms started after he ran out of Lasix.  Over the past 2 days, he gained 10 pounds.  He also noted cough with sputum production.  Also, the patient was found to have elevated white count, so the diagnosis of community-acquired pneumonia as the symptoms and signs suggestive of possible community-acquired pneumonia.  The patient was started on IV Avelox and also was given Lasix IV for the CHF exacerbation.  BRIEF HOSPITAL COURSE: 1. Community-acquired pneumonia.  The patient's white count has     improved now to 14,000 after he was started on IV antibiotics and     so he will be discharged home on 6 more days of Avelox to  complete     10 days of treatment for the pneumonia. 2. Gout.  The patient developed gout in the hospital after he was     given diuresis for the CHF exacerbation.  The patient was started     on indomethacin for 1 day only and his gout has improved.  The     patient will be sent home on allopurinol along with colchicine 0.6     mg p.o. b.i.d. p.r.n. for gout.  At this time, the patient's gout     has improved. 3. AFib.  Heart rate is well controlled.  The patient was continued on     digoxin and Coreg and also the INR is therapeutic at this time.     Also the patient complained of right lower extremity pain so     bilateral venous duplex was done, which was negative and the venous     Doppler is negative for any DVT.  At this time, the patient is     stable for discharge, and the patient will continue to take his     medication including the Lantus for the diabetes mellitus.  MEDICATIONS AT DISCHARGE: 1. Allopurinol 100 mg p.o. daily. 2. Colchicine 0.6 mg p.o. b.i.d. as needed. 3. Moxifloxacin 400 mg p.o. daily  for 6 days. 4. Calcium 600 mg 1 tab p.o. in the evening. 5. Coreg 25 mg half tablet p.o. b.i.d. 6. Coumadin 5 mg 0.5 tablet on Monday, Wednesday, and Friday and one     tablet on all the other days. 7. Digoxin 0.125 mg p.o. daily. 8. Fluoxetine 10 mg p.o. daily. 9. Folic acid 0.4 mg 1 tablet p.o. daily. 10.Furosemide 20 mg p.o. twice daily. 11.Lantus 100 units subcu daily at bedtime. 12.Claritin 10 mg p.o. daily as needed. 13.Lovaza 1 tablet p.o. 1 g 4 times a day. 14.Multivitamin 1 tablet p.o. daily. 15.Niacin SR 750 mg 2 tablets daily at bedtime. 16.Pepcid 20 mg 1 tab p.o. daily. 17.Ramipril 2.5 mg p.o. daily. 18.Simvastatin 40 mg p.o. daily at bedtime. 19.Spironolactone 25 mg one-half tablet p.o. daily evening. 20.Atenolol 25 mg 1-2 tablets every 4 hours.  Also mention that for CHF, the patient was given IV Lasix and he has diuresed very well.  Currently, the  patient is stable.  Her blood pressure on the day of discharge 123/78, pulse is 60, temp 98.3.  The patient is currently euvolemic.     Eleonore Chiquito, MD     GL/MEDQ  D:  09/18/2011  T:  09/18/2011  Job:  [id number]  cc:   Darrick Penna. Linna Darner, MD,FACP,FCCP Deboraha Sprang, MD, [facility]  Electronically Signed by Eleonore Chiquito  on 10/06/2011 09:43:44 AM

## 2011-11-03 NOTE — Discharge Summary (Signed)
NAMELATROY, CANTAVE            ACCOUNT NO.:  [id number]  MEDICAL RECORD NO.:  [id number]  LOCATION:  [id number]                         FACILITY:  [facility]  PHYSICIAN:  Eleonore Chiquito, MD         DATE OF BIRTH:  1941-02-16  DATE OF ADMISSION:  09/15/2011 DATE OF DISCHARGE:  09/18/2011                              DISCHARGE SUMMARY   DISCHARGE SUMMARY ADDENDUM  Please make a note that the medication #20 (atenolol 25 mg) was transcribed in error and should be replaced by Tylenol (acetaminophen) 325 mg 1-2 tablets by mouth every 4 hours as needed for pain.     Eleonore Chiquito, MD     GL/MEDQ  D:  11/03/2011  T:  11/03/2011  Job:  [id number]

## 2011-11-04 ENCOUNTER — Encounter: Payer: Medicare Other | Admitting: Internal Medicine

## 2011-11-17 ENCOUNTER — Other Ambulatory Visit: Payer: Self-pay | Admitting: Internal Medicine

## 2011-11-26 ENCOUNTER — Encounter: Payer: Medicare Other | Admitting: Internal Medicine

## 2011-11-28 ENCOUNTER — Encounter: Payer: Self-pay | Admitting: Internal Medicine

## 2011-11-28 ENCOUNTER — Ambulatory Visit (INDEPENDENT_AMBULATORY_CARE_PROVIDER_SITE_OTHER): Payer: Medicare Other | Admitting: *Deleted

## 2011-11-28 ENCOUNTER — Ambulatory Visit (INDEPENDENT_AMBULATORY_CARE_PROVIDER_SITE_OTHER): Payer: Medicare Other | Admitting: Internal Medicine

## 2011-11-28 VITALS — BP 112/72 | HR 60 | Ht 66.0 in | Wt 185.0 lb

## 2011-11-28 DIAGNOSIS — I2510 Atherosclerotic heart disease of native coronary artery without angina pectoris: Secondary | ICD-10-CM

## 2011-11-28 DIAGNOSIS — I2589 Other forms of chronic ischemic heart disease: Secondary | ICD-10-CM

## 2011-11-28 DIAGNOSIS — I4891 Unspecified atrial fibrillation: Secondary | ICD-10-CM

## 2011-11-28 DIAGNOSIS — Z95810 Presence of automatic (implantable) cardiac defibrillator: Secondary | ICD-10-CM | POA: Insufficient documentation

## 2011-11-28 DIAGNOSIS — I5023 Acute on chronic systolic (congestive) heart failure: Secondary | ICD-10-CM

## 2011-11-28 DIAGNOSIS — Z7901 Long term (current) use of anticoagulants: Secondary | ICD-10-CM

## 2011-11-28 LAB — ICD DEVICE OBSERVATION
AL AMPLITUDE: 2.9 mv
AL IMPEDENCE ICD: 437.5 Ohm
ATRIAL PACING ICD: 98 pct
BAMS-0001: 160 {beats}/min
BAMS-0003: 70 {beats}/min
FVT: 0
RV LEAD AMPLITUDE: 12 mv
RV LEAD IMPEDENCE ICD: 675 Ohm
TOT-0007: 1
TOT-0008: 0
TOT-0009: 2
TZAT-0001FASTVT: 1
TZAT-0004FASTVT: 8
TZAT-0004SLOWVT: 8
TZAT-0012FASTVT: 200 ms
TZAT-0012SLOWVT: 200 ms
TZAT-0018FASTVT: NEGATIVE
TZAT-0018SLOWVT: NEGATIVE
TZAT-0019FASTVT: 7.5 V
TZAT-0019SLOWVT: 7.5 V
TZAT-0020FASTVT: 1 ms
TZON-0003FASTVT: 285 ms
TZON-0003SLOWVT: 350 ms
TZON-0004FASTVT: 24
TZON-0004SLOWVT: 50
TZON-0005FASTVT: 6
TZON-0010SLOWVT: 80 ms
TZST-0001FASTVT: 3
TZST-0001FASTVT: 4
TZST-0001SLOWVT: 3
TZST-0001SLOWVT: 5
TZST-0003FASTVT: 30 J
TZST-0003FASTVT: 40 J
TZST-0003FASTVT: 40 J
TZST-0003SLOWVT: 40 J
VF: 0

## 2011-11-28 LAB — POCT INR: INR: 1.9

## 2011-11-28 MED ORDER — RAMIPRIL 2.5 MG PO CAPS: 2.5000 mg | ORAL_CAPSULE | Freq: Every day | ORAL | Status: DC

## 2011-11-28 NOTE — Assessment & Plan Note (Signed)
The patient's device was interrogated.  The information was reviewed. No changes were made in the programming.    

## 2011-11-28 NOTE — Assessment & Plan Note (Signed)
Continue current medications. We will discontinue his aspirin

## 2011-11-28 NOTE — Patient Instructions (Signed)
Remote monitoring is used to monitor your Pacemaker of ICD from home. This monitoring reduces the number of office visits required to check your device to one time per year. It allows [place] to keep an eye on the functioning of your device to ensure it is working properly. You are scheduled for a device check from home on 03/04/12. You may send your transmission at any time that day. If you have a wireless device, the transmission will be sent automatically. After your physician reviews your transmission, you will receive a postcard with your next transmission date.  Your physician wants you to follow-up in: 6 months with Dr Stanford Breed and 12 months with Dr Janeal Holmes will receive a reminder letter in the mail two months in advance. If you don't receive a letter, please call our office to schedule the follow-up appointment.

## 2011-11-28 NOTE — Assessment & Plan Note (Signed)
infrequenst af  On coumadin will check INR today

## 2011-11-28 NOTE — Progress Notes (Signed)
HPI  Mason Arnold is a 70 y.o. male  he is seen in followup for ischemic cardiomyopathy status post coronary bypass graft, His last Myoview was performed on September 22, 2007. At that time, he had an ejection fraction of 27%. There was a previous infarct involving the anterior wall, apex, and inferior wall, but there was no ischemia noted.  he is status post ICD implantation; underwent generator replacement in October 2010.  He was hospitalized 4 pneumonia in October.  Past Medical History  Diagnosis Date  . Diabetes mellitus type II   . Gout   . Myocardial infarction 2000  . Ventricular tachycardia   . Ischemic heart disease   . Congestive heart failure   . Left bundle branch block   . Hyperlipidemia     Past Surgical History  Procedure Date  . Coronary artery bypass graft   . Urecal cyst resection   . Inguinal herniorrhapy   . Tonsillectomy   . Vasectomy   . A-v cardiac pacemaker insertion     Current Outpatient Prescriptions  Medication Sig Dispense Refill  . acetaminophen (TYLENOL) 500 MG tablet Take 500 mg by mouth every 6 (six) hours as needed.        Marland Kitchen allopurinol (ZYLOPRIM) 100 MG tablet Take 100 mg by mouth daily.        Marland Kitchen aspirin 81 MG tablet Take 81 mg by mouth daily.        . calcium carbonate (OS-CAL) 600 MG TABS Take 600 mg by mouth daily.        . carvedilol (COREG) 25 MG tablet Take 25 mg by mouth 2 (two) times daily with a meal. Half a tablet two times a day        . COLCRYS 0.6 MG tablet Take 0.6 mg by mouth 2 (two) times daily as needed.       . digoxin (LANOXIN) 0.125 MG tablet Take 1 tablet (125 mcg total) by mouth daily.  30 tablet  3  . famotidine (PEPCID) 10 MG tablet Take 10 mg by mouth 2 (two) times daily.        Marland Kitchen FLUoxetine (PROZAC) 10 MG capsule Take 10 mg by mouth daily.        . folic acid (FOLVITE) A999333 MCG tablet Take 400 mcg by mouth daily.        . furosemide (LASIX) 40 MG tablet Take 1 tablet (40 mg total) by mouth 2 (two) times  daily. Also 3 tablets every other day  80 tablet  3  . insulin glargine (LANTUS) 100 UNIT/ML injection Inject 105 Units into the skin at bedtime.        Marland Kitchen loratadine (CLARITIN) 10 MG tablet Take 10 mg by mouth as needed.        . Multiple Vitamin (MULTIVITAMIN) tablet Take 2 tablets by mouth daily.        . niacin (NIASPAN) 750 MG CR tablet Take 750 mg by mouth 2 (two) times daily.        Marland Kitchen omega-3 acid ethyl esters (LOVAZA) 1 G capsule Take 2 g by mouth 2 (two) times daily.        . ramipril (ALTACE) 2.5 MG capsule Take 1 capsule (2.5 mg total) by mouth daily.  90 capsule  3  . simvastatin (ZOCOR) 20 MG tablet Take 20 mg by mouth daily.        Marland Kitchen spironolactone (ALDACTONE) 25 MG tablet Take 25 mg by mouth daily.        Marland Kitchen  warfarin (COUMADIN) 5 MG tablet Take as directed by Anticoagulation clinic    90 tablet  1  . DISCONTD: ramipril (ALTACE) 2.5 MG capsule TAKE 1 CAPSULE BY MOUTH ONCE A DAY  30 capsule  0    Allergies  Allergen Reactions  . Ampicillin     REACTION: Reaction not known  . Propoxyphene N-Acetaminophen     REACTION: Reaction not known    Review of Systems negative except from HPI and PMH  Physical Exam Well developed and well nourished in no acute distress HENT normal E scleral and icterus clear Neck Supple JVP flat; carotids brisk and full Clear to ausculation Regular rate and rhythm, no murmurs gallops or rub Soft with active bowel sounds No clubbing cyanosis none Edema Alert and oriented, grossly normal motor and sensory function Skin Warm and Dry   Assessment and  Plan

## 2011-11-28 NOTE — Assessment & Plan Note (Signed)
Stable on current medications. Without chest pain to

## 2011-12-26 ENCOUNTER — Encounter: Payer: Medicare Other | Admitting: *Deleted

## 2012-01-09 ENCOUNTER — Ambulatory Visit (INDEPENDENT_AMBULATORY_CARE_PROVIDER_SITE_OTHER): Payer: Medicare Other | Admitting: Pharmacist

## 2012-01-09 DIAGNOSIS — Z7901 Long term (current) use of anticoagulants: Secondary | ICD-10-CM

## 2012-01-09 DIAGNOSIS — I4891 Unspecified atrial fibrillation: Secondary | ICD-10-CM

## 2012-01-31 ENCOUNTER — Other Ambulatory Visit: Payer: Self-pay | Admitting: Cardiology

## 2012-02-06 ENCOUNTER — Ambulatory Visit (INDEPENDENT_AMBULATORY_CARE_PROVIDER_SITE_OTHER): Payer: Medicare Other | Admitting: *Deleted

## 2012-02-06 DIAGNOSIS — Z7901 Long term (current) use of anticoagulants: Secondary | ICD-10-CM

## 2012-02-06 DIAGNOSIS — I4891 Unspecified atrial fibrillation: Secondary | ICD-10-CM

## 2012-03-04 ENCOUNTER — Ambulatory Visit (INDEPENDENT_AMBULATORY_CARE_PROVIDER_SITE_OTHER): Payer: Medicare Other | Admitting: *Deleted

## 2012-03-04 ENCOUNTER — Encounter: Payer: Self-pay | Admitting: Internal Medicine

## 2012-03-04 DIAGNOSIS — I472 Ventricular tachycardia: Secondary | ICD-10-CM

## 2012-03-04 DIAGNOSIS — I4891 Unspecified atrial fibrillation: Secondary | ICD-10-CM

## 2012-03-04 DIAGNOSIS — I428 Other cardiomyopathies: Secondary | ICD-10-CM

## 2012-03-04 DIAGNOSIS — Z7901 Long term (current) use of anticoagulants: Secondary | ICD-10-CM

## 2012-03-04 DIAGNOSIS — I5023 Acute on chronic systolic (congestive) heart failure: Secondary | ICD-10-CM

## 2012-03-04 LAB — REMOTE ICD DEVICE
AL IMPEDENCE ICD: 440 Ohm
BRDY-0002RV: 60 {beats}/min
BRDY-0003RV: 110 {beats}/min
BRDY-0004RV: 125 {beats}/min
TZAT-0001FASTVT: 1
TZAT-0004SLOWVT: 8
TZAT-0013SLOWVT: 3
TZAT-0018FASTVT: NEGATIVE
TZAT-0018SLOWVT: NEGATIVE
TZAT-0019FASTVT: 7.5 V
TZAT-0019SLOWVT: 7.5 V
TZAT-0020FASTVT: 1 ms
TZON-0003FASTVT: 285 ms
TZON-0003SLOWVT: 350 ms
TZON-0004FASTVT: 24
TZON-0004SLOWVT: 50
TZON-0005FASTVT: 6
TZON-0010FASTVT: 80 ms
TZON-0010SLOWVT: 80 ms
TZST-0001FASTVT: 3
TZST-0001FASTVT: 4
TZST-0001SLOWVT: 3
TZST-0001SLOWVT: 5
TZST-0003FASTVT: 30 J
TZST-0003FASTVT: 40 J
TZST-0003SLOWVT: 30 J
TZST-0003SLOWVT: 40 J

## 2012-03-04 LAB — POCT INR: INR: 1.7

## 2012-03-10 NOTE — Progress Notes (Signed)
Remote icd check w/icm  

## 2012-03-12 ENCOUNTER — Encounter: Payer: Self-pay | Admitting: *Deleted

## 2012-03-12 ENCOUNTER — Ambulatory Visit (INDEPENDENT_AMBULATORY_CARE_PROVIDER_SITE_OTHER): Payer: Medicare Other | Admitting: Internal Medicine

## 2012-03-12 ENCOUNTER — Encounter: Payer: Self-pay | Admitting: Internal Medicine

## 2012-03-12 VITALS — BP 100/60 | HR 60 | Temp 98.6°F | Wt 184.0 lb

## 2012-03-12 DIAGNOSIS — R0609 Other forms of dyspnea: Secondary | ICD-10-CM

## 2012-03-12 DIAGNOSIS — R42 Dizziness and giddiness: Secondary | ICD-10-CM

## 2012-03-12 DIAGNOSIS — R0600 Dyspnea, unspecified: Secondary | ICD-10-CM

## 2012-03-12 DIAGNOSIS — I951 Orthostatic hypotension: Secondary | ICD-10-CM

## 2012-03-12 DIAGNOSIS — J209 Acute bronchitis, unspecified: Secondary | ICD-10-CM

## 2012-03-12 DIAGNOSIS — I5023 Acute on chronic systolic (congestive) heart failure: Secondary | ICD-10-CM

## 2012-03-12 MED ORDER — AZITHROMYCIN 250 MG PO TABS: ORAL_TABLET | ORAL | Status: AC

## 2012-03-12 NOTE — Patient Instructions (Signed)
Repeat the isometric exercises discussed 4- 5 times prior to standing if you've been seated for a period of time.  Order for x-rays entered into  the computer; these will be performed at [place]. across from [facility]. No appointment is necessary. Please try to go on My Chart within the next 24 hours to allow me to release the results directly to you.

## 2012-03-12 NOTE — Progress Notes (Signed)
Addended byHendricks Limes on: 03/12/2012 06:28 PM   Modules accepted: Level of Service

## 2012-03-12 NOTE — Progress Notes (Signed)
Subjective:    Patient ID: Mason Arnold, male    DOB: October 24, 1941, 71 y.o.   MRN: [id number]  HPI He is here with multiple issues. #1 He has had shortness of breath for a couple of weeks worse in the last 5 days. This is been associated with a paroxysmal cough, severe enough at times to cause near  syncope. He has had white sputum without purulence. The cough has been dry for the last 2 days. He has had chills but no fever or sweats. He's also had frontal headaches but no facial pain, earache , otic discharge, nasal purulence, or sore throat. #2 He's had dizziness for almost a year ,worse in  the last 5 days. He has noted triggers of bending over or when he works with his arms above his head. He also has some postural symptoms when he rises from the bed. He is not havin benign positional vertigo symptoms. He has no cardiac or neuro prodrome prior to this.  #3 subjectively he questions swelling across the upper abdomen. He does not have pedal edema. He describes intermittent paroxysmal nocturnal dyspnea.       Review of Systems His PT/INR was 1.7 last week the Coumadin clinic. Adjustment was made in dosage with repeat PT/INR in 3 weeks from now.  He alternates taking 80 mg one day 120 mg the next as per Dr Caryl Comes & Stanford Breed.     Objective:   Physical Exam Gen.: Healthy and well-nourished in appearance. Alert, appropriate and cooperative throughout exam. Head: Normocephalic without obvious abnormalities Eyes: No corneal or conjunctival inflammation noted. Pupils equal round reactive to light and accommodation.  Extraocular motion intact.Field of  Vision grossly normal. Ears: External  ear exam reveals no significant lesions or deformities. Canals clear . Hearing is grossly normal bilaterally.TMs dull Nose: External nasal exam reveals no deformity or inflammation. Nasal mucosa are pink and moist. No lesions or exudates noted.  Mouth: Oral mucosa and oropharynx reveal no lesions or exudates.  Teeth in good repair. Neck: No deformities, masses, or tenderness noted. Range of motion normal Lungs: Normal respiratory effort; chest expands symmetrically. Lungs are clear to auscultation without rales, or wheeze. There is mild increased work of breathing with dressing. O2 sats 93% on room air Heart: Normal rate and rhythm. Normal S1 and S2. No gallop, click, or rub. Grade 1 systolic murmur. Blood pressure supine 124/78; sitting 110/66; and 100/60 standing. Chest: pacer L chest Abdomen: Bowel sounds normal; abdomen soft and nontender. No masses, organomegaly or hernias noted. I cannot appreciate any edema across the upper abdomen Musculoskeletal/extremities: No deformity or scoliosis noted of  the thoracic or lumbar spine. No clubbing, cyanosis, edema, or deformity noted. Tone & strength  normal.Joints normal. Nail health  good. Vascular: Carotid, radial artery, dorsalis pedis and  posterior tibial pulses are full and equal. No bruits present. Neurologic: Alert and oriented x3. Deep tendon reflexes symmetrical and normal.          Skin: Intact without suspicious lesions or rashes. Lymph: No cervical, axillary lymphadenopathy present. Psych: Mood and affect are normal. Normally interactive  Assessment & Plan:  #1 dyspnea; this is in the context of paroxysmal cough with clear sputum. Cough has been severe enough to cause near syncope. This probably represents a vagal phenomena.The cough could also be related to pulmonary vascular congestion.  #2 dizziness, multifactorial. Some of the triggers suggest possible subclavian steal syndrome but I do not hear any bruits around the clavicles. This has also been related to  waist  flexion; this might compromise cerebrovascular flow. Also history suggest postural hypotension. Postural blood pressures do indicate some drop about 24 points systolic and 18 points  diastolic.  #3 subjective edema of the upper abdomen; this may be a manifestation of fluid retention.  Plan: There is no x-ray at this facility & the lab is closed. I shall increase the Lasix 40 mg 2 twice a day for the next 2 days and then resume the prior schedule. An order will be placed to get a chest x-ray Monday 4/8. If symptoms persist BNP should be performed.  Zithromax will be prescribed to cover atypical infection. His PT/INR should be rechecked in 5 days.  If symptoms worsen he should take a copy of this note and proceed to [facility] emergency room.  Isometric exercises are essentially prior to standing if he has been seated for a period

## 2012-03-12 NOTE — Progress Notes (Signed)
  Subjective:    Patient ID: Mason Arnold, male    DOB: 1941-04-11, 71 y.o.   MRN: [id number]  HPI    Review of Systems his Diabetes is being treated at the [place]. He is on 95 units of Lantus daily.He denies hypoglycemia. His A1c was 5.8% on 09/30/11. At that time his triglycerides are 251, LDL 81.     Objective:   Physical Exam        Assessment & Plan:  The most common cause of elevated triglycerides is the ingestion of sugar from high fructose corn syrup sources added to processed foods & drinks.  Eat a low-fat diet with lots of fruits and vegetables, up to 7-9 servings per day. Consume less than 40 (preferably ZERO) grams of sugar per day from foods & drinks with High Fructose Corn Syrup (HFCS) sugar as #1,2,3 or # 4 on label.[employer], [employer] [place] do not carry products with HFCS. Follow a  low carb nutrition program such as [place] or [employer]  to prevent Diabetes progression . White carbohydrates (potatoes, rice, bread, and pasta) have a high spike of sugar and a high load of sugar. For example a  baked potato has a cup of sugar and a  french fry  2 teaspoons of sugar. Yams, wild  rice, whole grained bread &  wheat pasta have been much lower spike and load of  sugar. Portions should be the size of a deck of cards or your palm.  I asked him to verify that the [place] wants his A1c to be at such low levels. At his age and with multiple  comorbidities; I thought the guidelines would indicate an A1c goal of 8%. He is being followed by Dr. Lynnette Caffey and Dr. Sherral Hammers at the [facility].

## 2012-03-15 ENCOUNTER — Ambulatory Visit (INDEPENDENT_AMBULATORY_CARE_PROVIDER_SITE_OTHER)
Admission: RE | Admit: 2012-03-15 | Discharge: 2012-03-15 | Disposition: A | Discharge status: Home / Self Care | Payer: Medicare Other | Source: Ambulatory Visit | Attending: Internal Medicine | Admitting: Internal Medicine

## 2012-03-15 DIAGNOSIS — R0600 Dyspnea, unspecified: Secondary | ICD-10-CM

## 2012-03-15 DIAGNOSIS — I5023 Acute on chronic systolic (congestive) heart failure: Secondary | ICD-10-CM

## 2012-03-15 DIAGNOSIS — R0609 Other forms of dyspnea: Secondary | ICD-10-CM

## 2012-03-15 DIAGNOSIS — J209 Acute bronchitis, unspecified: Secondary | ICD-10-CM

## 2012-03-16 ENCOUNTER — Encounter: Payer: Self-pay | Admitting: Internal Medicine

## 2012-03-18 ENCOUNTER — Ambulatory Visit (INDEPENDENT_AMBULATORY_CARE_PROVIDER_SITE_OTHER): Payer: Medicare Other

## 2012-03-18 DIAGNOSIS — Z7901 Long term (current) use of anticoagulants: Secondary | ICD-10-CM

## 2012-03-18 DIAGNOSIS — I4891 Unspecified atrial fibrillation: Secondary | ICD-10-CM

## 2012-03-18 LAB — POCT INR: INR: 2

## 2012-04-15 ENCOUNTER — Ambulatory Visit (INDEPENDENT_AMBULATORY_CARE_PROVIDER_SITE_OTHER): Payer: Medicare Other

## 2012-04-15 DIAGNOSIS — I4891 Unspecified atrial fibrillation: Secondary | ICD-10-CM

## 2012-04-15 DIAGNOSIS — Z7901 Long term (current) use of anticoagulants: Secondary | ICD-10-CM

## 2012-04-15 LAB — POCT INR: INR: 2.1

## 2012-04-28 ENCOUNTER — Encounter: Payer: Self-pay | Admitting: Cardiology

## 2012-05-17 ENCOUNTER — Telehealth: Payer: Self-pay | Admitting: *Deleted

## 2012-05-17 NOTE — Telephone Encounter (Signed)
Discuss with patient who states that he will have these issue addressed at [place].

## 2012-05-17 NOTE — Telephone Encounter (Signed)
Message copied by Marylen Ponto on Mon May 17, 2012  5:15 PM ------      Message from: Hendricks Limes      Created: Mon May 17, 2012  2:39 PM       Triglycerides were 322 on 04/28/12 [place]. A1c was in the nondiabetic range at 6%. These should be addressed  with an office visit at the [place] or here

## 2012-05-20 ENCOUNTER — Ambulatory Visit (INDEPENDENT_AMBULATORY_CARE_PROVIDER_SITE_OTHER): Payer: Medicare Other | Admitting: *Deleted

## 2012-05-20 DIAGNOSIS — I4891 Unspecified atrial fibrillation: Secondary | ICD-10-CM

## 2012-05-20 DIAGNOSIS — Z7901 Long term (current) use of anticoagulants: Secondary | ICD-10-CM

## 2012-05-20 LAB — POCT INR: INR: 2

## 2012-06-03 ENCOUNTER — Ambulatory Visit (INDEPENDENT_AMBULATORY_CARE_PROVIDER_SITE_OTHER): Payer: Medicare Other | Admitting: Cardiology

## 2012-06-03 ENCOUNTER — Encounter: Payer: Self-pay | Admitting: Cardiology

## 2012-06-03 VITALS — BP 130/82 | HR 60 | Ht 65.0 in | Wt 183.0 lb

## 2012-06-03 DIAGNOSIS — I4891 Unspecified atrial fibrillation: Secondary | ICD-10-CM

## 2012-06-03 DIAGNOSIS — E782 Mixed hyperlipidemia: Secondary | ICD-10-CM

## 2012-06-03 DIAGNOSIS — I10 Essential (primary) hypertension: Secondary | ICD-10-CM

## 2012-06-03 DIAGNOSIS — I5023 Acute on chronic systolic (congestive) heart failure: Secondary | ICD-10-CM

## 2012-06-03 DIAGNOSIS — I2589 Other forms of chronic ischemic heart disease: Secondary | ICD-10-CM

## 2012-06-03 DIAGNOSIS — Z95810 Presence of automatic (implantable) cardiac defibrillator: Secondary | ICD-10-CM

## 2012-06-03 DIAGNOSIS — R0609 Other forms of dyspnea: Secondary | ICD-10-CM

## 2012-06-03 DIAGNOSIS — I2510 Atherosclerotic heart disease of native coronary artery without angina pectoris: Secondary | ICD-10-CM

## 2012-06-03 LAB — BASIC METABOLIC PANEL
BUN: 18 mg/dL (ref 6–23)
Chloride: 100 mEq/L (ref 96–112)
GFR: 64.43 mL/min (ref >60.00)
Glucose, Bld: 91 mg/dL (ref 70–99)
Potassium: 4.1 mEq/L (ref 3.5–5.1)
Sodium: 138 mEq/L (ref 135–145)

## 2012-06-03 LAB — CBC WITH DIFFERENTIAL/PLATELET
Basophils Relative: 0.3 % (ref 0.0–3.0)
Eosinophils Relative: 1.6 % (ref 0.0–5.0)
Lymphocytes Relative: 18.4 % (ref 12.0–46.0)
Monocytes Absolute: 1 10*3/uL (ref 0.1–1.0)
Neutrophils Relative %: 71.2 % (ref 43.0–77.0)
Platelets: 178 10*3/uL (ref 150.0–400.0)
RBC: 4.78 Mil/uL (ref 4.22–5.81)
WBC: 11.5 10*3/uL — ABNORMAL HIGH (ref 4.5–10.5)

## 2012-06-03 LAB — BRAIN NATRIURETIC PEPTIDE: Pro B Natriuretic peptide (BNP): 323 pg/mL — ABNORMAL HIGH (ref 0.0–100.0)

## 2012-06-03 MED ORDER — FUROSEMIDE 40 MG PO TABS: ORAL_TABLET | ORAL | Status: DC

## 2012-06-03 NOTE — Assessment & Plan Note (Signed)
Continue statin. Discontinue aspirin as he requires Coumadin for history of atrial fibrillation.

## 2012-06-03 NOTE — Progress Notes (Signed)
HPI: Mason Arnold is a pleasant gentleman who has a history of coronary artery disease status post coronary bypass graft, as well as an ischemic cardiomyopathy and hyperlipidemia.  His last Myoview was performed in July of 2011. At that time, he had an ejection fraction of 28%. There was a previous infarct involving the anterior wall, apex, and inferior wall with minimal ischemia noted. I last saw him in July of 2011. Since then, he has recently noticed increased dyspnea on exertion. No orthopnea, PND, pedal edema, palpitations or syncope. He has some chest heaviness that time relieved with deep inspiration. No symptoms similar to his infarct pain.  Current Outpatient Prescriptions  Medication Sig Dispense Refill  . acetaminophen (TYLENOL) 500 MG tablet Take 500 mg by mouth every 6 (six) hours as needed.        Marland Kitchen allopurinol (ZYLOPRIM) 100 MG tablet Take 100 mg by mouth daily.        Marland Kitchen aspirin 81 MG tablet Take 81 mg by mouth daily.        . calcium carbonate (OS-CAL) 600 MG TABS Take 600 mg by mouth daily.        . carvedilol (COREG) 25 MG tablet Take 25 mg by mouth 2 (two) times daily with a meal. Half a tablet two times a day        . COLCRYS 0.6 MG tablet Take 0.6 mg by mouth 2 (two) times daily as needed.       . digoxin (LANOXIN) 0.125 MG tablet Take 1 tablet (125 mcg total) by mouth daily.  30 tablet  3  . famotidine (PEPCID) 10 MG tablet Take 10 mg by mouth 2 (two) times daily.        Marland Kitchen FLUoxetine (PROZAC) 10 MG capsule Take 10 mg by mouth daily.        . folic acid (FOLVITE) A999333 MCG tablet Take 400 mcg by mouth daily.        . furosemide (LASIX) 40 MG tablet Take 1 tablet (40 mg total) by mouth 2 (two) times daily. Also 3 tablets every other day  80 tablet  3  . insulin glargine (LANTUS) 100 UNIT/ML injection Inject 105 Units into the skin at bedtime.        Marland Kitchen loratadine (CLARITIN) 10 MG tablet Take 10 mg by mouth as needed.        . Multiple Vitamin (MULTIVITAMIN) tablet Take 2  tablets by mouth daily.        . niacin (NIASPAN) 750 MG CR tablet Take 750 mg by mouth 2 (two) times daily.        Marland Kitchen omega-3 acid ethyl esters (LOVAZA) 1 G capsule Take 2 g by mouth 2 (two) times daily.        . ramipril (ALTACE) 2.5 MG capsule Take 1 capsule (2.5 mg total) by mouth daily.  90 capsule  3  . simvastatin (ZOCOR) 20 MG tablet Take 20 mg by mouth daily.        Marland Kitchen spironolactone (ALDACTONE) 25 MG tablet Take 25 mg by mouth daily.        Marland Kitchen warfarin (COUMADIN) 5 MG tablet TAKE AS DIRECTED BY ANTICOAGULATION CLINIC  90 tablet  1     Past Medical History  Diagnosis Date  . Diabetes mellitus type II   . Gout   . Myocardial infarction 2000  . Ventricular tachycardia   . Ischemic heart disease   . Congestive heart failure   . Left bundle branch  block   . Hyperlipidemia   . Implantable cardiac defibrillator   11/28/2011    Past Surgical History  Procedure Date  . Coronary artery bypass graft   . Urecal cyst resection   . Inguinal herniorrhapy   . Tonsillectomy   . Vasectomy   . A-v cardiac pacemaker insertion     History   Social History  . Marital Status: Married    Spouse Name: N/A    Number of Children: N/A  . Years of Education: N/A   Occupational History  . Not on file.   Social History Main Topics  . Smoking status: Never Smoker   . Smokeless tobacco: Former Systems developer    Types: Snuff, Chew   Comment: for 15 years  . Alcohol Use: Yes  . Drug Use: No  . Sexually Active: Not on file   Other Topics Concern  . Not on file   Social History Narrative  . No narrative on file    ROS: no fevers or chills, productive cough, hemoptysis, dysphasia, odynophagia, melena, hematochezia, dysuria, hematuria, rash, seizure activity, orthopnea, PND, pedal edema, claudication. Remaining systems are negative.  Physical Exam: Well-developed well-nourished in no acute distress.  Skin is warm and dry.  HEENT is normal.  Neck is supple.  Chest is clear to auscultation  with normal expansion. ICD left chest. Previous sternotomy. Cardiovascular exam is regular rate and rhythm.  Abdominal exam nontender or distended. No masses palpated. Extremities show no edema. neuro grossly intact  ECG atrial paced rhythm, left bundle branch block.

## 2012-06-03 NOTE — Assessment & Plan Note (Signed)
Continue statin. 

## 2012-06-03 NOTE — Patient Instructions (Addendum)
Your physician recommends that you schedule a follow-up appointment in: 6-8 [place] 40 MG TAKE TWO TABLET TWICE DAILY FOR TWO DAYS THEN DECREASE TO ONE AND ONE HALF TABLETS TWICE DAILY  Your physician recommends that you HAVE LAB WORK TODAY AND IN ONE WEEK  Your physician has requested that you have an echocardiogram. Echocardiography is a painless test that uses sound waves to create images of your heart. It provides your doctor with information about the size and shape of your heart and how well your heart's chambers and valves are working. This procedure takes approximately one hour. There are no restrictions for this procedure.

## 2012-06-03 NOTE — Assessment & Plan Note (Signed)
Management per electrophysiology. 

## 2012-06-03 NOTE — Assessment & Plan Note (Signed)
Continue Coumadin and beta blocker. Patient presently in an atrial paced rhythm.

## 2012-06-03 NOTE — Assessment & Plan Note (Signed)
Continue ACE inhibitor and beta blocker. I am hesitant to increase his ACE inhibitor as he has had problems with orthostasis previously.

## 2012-06-03 NOTE — Assessment & Plan Note (Signed)
Blood pressure controlled. Continue present medications. 

## 2012-06-03 NOTE — Assessment & Plan Note (Addendum)
Patient complaining of increased dyspnea. I will increase his Lasix to 80 mg by mouth twice a day for 2 days and then 60 mg BID. Check potassium, renal function, BNP and CBC today. Repeat potassium and renal function one week. Repeat echocardiogram.

## 2012-06-07 ENCOUNTER — Other Ambulatory Visit (HOSPITAL_COMMUNITY): Payer: Medicare Other

## 2012-06-11 ENCOUNTER — Ambulatory Visit (HOSPITAL_COMMUNITY): Payer: Medicare Other | Attending: Internal Medicine

## 2012-06-11 ENCOUNTER — Other Ambulatory Visit (INDEPENDENT_AMBULATORY_CARE_PROVIDER_SITE_OTHER): Payer: Medicare Other

## 2012-06-11 ENCOUNTER — Ambulatory Visit (INDEPENDENT_AMBULATORY_CARE_PROVIDER_SITE_OTHER): Payer: Medicare Other | Admitting: *Deleted

## 2012-06-11 ENCOUNTER — Encounter: Payer: Self-pay | Admitting: Internal Medicine

## 2012-06-11 DIAGNOSIS — I2589 Other forms of chronic ischemic heart disease: Secondary | ICD-10-CM

## 2012-06-11 DIAGNOSIS — E785 Hyperlipidemia, unspecified: Secondary | ICD-10-CM | POA: Insufficient documentation

## 2012-06-11 DIAGNOSIS — I509 Heart failure, unspecified: Secondary | ICD-10-CM | POA: Insufficient documentation

## 2012-06-11 DIAGNOSIS — I517 Cardiomegaly: Secondary | ICD-10-CM | POA: Insufficient documentation

## 2012-06-11 DIAGNOSIS — E782 Mixed hyperlipidemia: Secondary | ICD-10-CM

## 2012-06-11 DIAGNOSIS — I10 Essential (primary) hypertension: Secondary | ICD-10-CM

## 2012-06-11 DIAGNOSIS — Z95810 Presence of automatic (implantable) cardiac defibrillator: Secondary | ICD-10-CM

## 2012-06-11 DIAGNOSIS — I2510 Atherosclerotic heart disease of native coronary artery without angina pectoris: Secondary | ICD-10-CM

## 2012-06-11 DIAGNOSIS — I472 Ventricular tachycardia, unspecified: Secondary | ICD-10-CM | POA: Insufficient documentation

## 2012-06-11 DIAGNOSIS — I4729 Other ventricular tachycardia: Secondary | ICD-10-CM | POA: Insufficient documentation

## 2012-06-11 DIAGNOSIS — I5023 Acute on chronic systolic (congestive) heart failure: Secondary | ICD-10-CM

## 2012-06-11 DIAGNOSIS — I4891 Unspecified atrial fibrillation: Secondary | ICD-10-CM

## 2012-06-11 DIAGNOSIS — E119 Type 2 diabetes mellitus without complications: Secondary | ICD-10-CM | POA: Insufficient documentation

## 2012-06-11 LAB — REMOTE ICD DEVICE
AL AMPLITUDE: 2.2 mv
ATRIAL PACING ICD: 98 pct
BAMS-0003: 70 {beats}/min
DEVICE MODEL ICD: 733092
HV IMPEDENCE: 42 Ohm
MODE SWITCH EPISODES: 7
RV LEAD IMPEDENCE ICD: 610 Ohm
TZAT-0001FASTVT: 1
TZAT-0004SLOWVT: 8
TZAT-0012FASTVT: 200 ms
TZAT-0012SLOWVT: 200 ms
TZAT-0018SLOWVT: NEGATIVE
TZAT-0019FASTVT: 7.5 V
TZAT-0019SLOWVT: 7.5 V
TZAT-0020FASTVT: 1 ms
TZON-0003SLOWVT: 350 ms
TZON-0004FASTVT: 24
TZON-0004SLOWVT: 50
TZON-0005FASTVT: 6
TZON-0005SLOWVT: 6
TZON-0010FASTVT: 80 ms
TZON-0010SLOWVT: 80 ms
TZST-0001FASTVT: 2
TZST-0001FASTVT: 4
TZST-0001SLOWVT: 3
TZST-0003FASTVT: 30 J
TZST-0003FASTVT: 40 J
VENTRICULAR PACING ICD: 1 pct

## 2012-06-11 LAB — BASIC METABOLIC PANEL
BUN: 21 mg/dL (ref 6–23)
CO2: 32 mEq/L (ref 19–32)
Chloride: 98 mEq/L (ref 96–112)
Creatinine, Ser: 1.2 mg/dL (ref 0.4–1.5)
Potassium: 4 mEq/L (ref 3.5–5.1)

## 2012-06-11 NOTE — Progress Notes (Signed)
Echocardiogram performed.  

## 2012-07-01 ENCOUNTER — Ambulatory Visit (INDEPENDENT_AMBULATORY_CARE_PROVIDER_SITE_OTHER): Payer: Medicare Other | Admitting: Pharmacist

## 2012-07-01 DIAGNOSIS — I4891 Unspecified atrial fibrillation: Secondary | ICD-10-CM

## 2012-07-01 DIAGNOSIS — Z7901 Long term (current) use of anticoagulants: Secondary | ICD-10-CM

## 2012-07-01 LAB — POCT INR: INR: 2.5

## 2012-07-02 ENCOUNTER — Encounter: Payer: Self-pay | Admitting: *Deleted

## 2012-07-07 ENCOUNTER — Telehealth: Payer: Self-pay | Admitting: Cardiology

## 2012-07-07 ENCOUNTER — Ambulatory Visit (INDEPENDENT_AMBULATORY_CARE_PROVIDER_SITE_OTHER): Payer: Medicare Other | Admitting: *Deleted

## 2012-07-07 DIAGNOSIS — R0602 Shortness of breath: Secondary | ICD-10-CM

## 2012-07-07 DIAGNOSIS — Z79899 Other long term (current) drug therapy: Secondary | ICD-10-CM

## 2012-07-07 LAB — BASIC METABOLIC PANEL
BUN: 19 mg/dL (ref 6–23)
CO2: 31 mEq/L (ref 19–32)
Calcium: 9.7 mg/dL (ref 8.4–10.5)
Chloride: 98 mEq/L (ref 96–112)
Creatinine, Ser: 1.3 mg/dL (ref 0.4–1.5)
GFR: 60.26 mL/min (ref >60.00)
Glucose, Bld: 130 mg/dL — ABNORMAL HIGH (ref 70–99)
Potassium: 3.7 mEq/L (ref 3.5–5.1)
Sodium: 140 mEq/L (ref 135–145)

## 2012-07-07 LAB — CBC WITH DIFFERENTIAL/PLATELET
Basophils Absolute: 0.1 10*3/uL (ref 0.0–0.1)
HCT: 44.1 % (ref 39.0–52.0)
Lymphocytes Relative: 19.2 % (ref 12.0–46.0)
Lymphs Abs: 2 10*3/uL (ref 0.7–4.0)
Monocytes Relative: 8.1 % (ref 3.0–12.0)
Neutrophils Relative %: 69.1 % (ref 43.0–77.0)
Platelets: 196 10*3/uL (ref 150.0–400.0)
RDW: 14.4 % (ref 11.5–14.6)

## 2012-07-07 LAB — BRAIN NATRIURETIC PEPTIDE: Pro B Natriuretic peptide (BNP): 288 pg/mL — ABNORMAL HIGH (ref 0.0–100.0)

## 2012-07-07 NOTE — Telephone Encounter (Signed)
Per wife patient is having cramping in legs to the point it keeps him up at night and some low back pain.  Also having increase shortness of breath and having to sleep on several pillows at night.  Lasix was increased at last ov secondary dyspnea and had follow up labs since but wife is concerned.  Patient has follow up 8/9 but wanted to know if recheck of labs may need to be done before then.  Patient did stop statin.  Will forward to Tera Helper. NP for review

## 2012-07-07 NOTE — Telephone Encounter (Signed)
Will forward to Dr Stanford Breed and Luisa Dago. RN for information

## 2012-07-07 NOTE — Telephone Encounter (Signed)
Change lasix to 80 mg po bid, bmet one week Kirk Ruths

## 2012-07-07 NOTE — Telephone Encounter (Signed)
Please return call to patient wife [phone] regarding medical care.

## 2012-07-07 NOTE — Telephone Encounter (Signed)
Check BNP, BMET and CBC. No change in meds til we can labs results.

## 2012-07-08 NOTE — Telephone Encounter (Signed)
Spoke with pt wife, aware of lab results and med change.

## 2012-07-08 NOTE — Addendum Note (Signed)
Addended by: Cristopher Estimable on: 07/08/2012 10:49 AM   Modules accepted: Orders

## 2012-07-15 ENCOUNTER — Other Ambulatory Visit (INDEPENDENT_AMBULATORY_CARE_PROVIDER_SITE_OTHER): Payer: Medicare Other

## 2012-07-15 ENCOUNTER — Other Ambulatory Visit: Payer: Self-pay | Admitting: *Deleted

## 2012-07-15 DIAGNOSIS — Z79899 Other long term (current) drug therapy: Secondary | ICD-10-CM

## 2012-07-15 DIAGNOSIS — R0602 Shortness of breath: Secondary | ICD-10-CM

## 2012-07-15 LAB — BASIC METABOLIC PANEL
BUN: 25 mg/dL — ABNORMAL HIGH (ref 6–23)
CO2: 30 mEq/L (ref 19–32)
Chloride: 90 mEq/L — ABNORMAL LOW (ref 96–112)
Creatinine, Ser: 1.3 mg/dL (ref 0.4–1.5)

## 2012-07-15 MED ORDER — POTASSIUM CHLORIDE CRYS ER 20 MEQ PO TBCR: EXTENDED_RELEASE_TABLET | ORAL | Status: DC

## 2012-07-16 ENCOUNTER — Encounter: Payer: Self-pay | Admitting: Cardiology

## 2012-07-16 ENCOUNTER — Ambulatory Visit (INDEPENDENT_AMBULATORY_CARE_PROVIDER_SITE_OTHER): Payer: Medicare Other | Admitting: Cardiology

## 2012-07-16 VITALS — BP 120/82 | HR 60 | Wt 180.0 lb

## 2012-07-16 DIAGNOSIS — E782 Mixed hyperlipidemia: Secondary | ICD-10-CM

## 2012-07-16 DIAGNOSIS — I2510 Atherosclerotic heart disease of native coronary artery without angina pectoris: Secondary | ICD-10-CM

## 2012-07-16 DIAGNOSIS — E876 Hypokalemia: Secondary | ICD-10-CM

## 2012-07-16 DIAGNOSIS — I2589 Other forms of chronic ischemic heart disease: Secondary | ICD-10-CM

## 2012-07-16 DIAGNOSIS — Z95810 Presence of automatic (implantable) cardiac defibrillator: Secondary | ICD-10-CM

## 2012-07-16 DIAGNOSIS — I4891 Unspecified atrial fibrillation: Secondary | ICD-10-CM

## 2012-07-16 DIAGNOSIS — I5023 Acute on chronic systolic (congestive) heart failure: Secondary | ICD-10-CM

## 2012-07-16 DIAGNOSIS — I10 Essential (primary) hypertension: Secondary | ICD-10-CM

## 2012-07-16 NOTE — Assessment & Plan Note (Signed)
Management per electrophysiology. 

## 2012-07-16 NOTE — Assessment & Plan Note (Signed)
Continue ACE inhibitor and beta blocker. I am hesitant to advance medications as he has a history of symptomatic orthostasis.

## 2012-07-16 NOTE — Assessment & Plan Note (Signed)
Continue statin. 

## 2012-07-16 NOTE — Assessment & Plan Note (Signed)
Continue statin. Not on aspirin given need for Coumadin. 

## 2012-07-16 NOTE — Addendum Note (Signed)
Addended by: Cristopher Estimable on: 07/16/2012 09:40 AM   Modules accepted: Orders

## 2012-07-16 NOTE — Progress Notes (Signed)
HPI: Mason Arnold is a pleasant gentleman who has a history of coronary artery disease status post coronary bypass graft, as well as an ischemic cardiomyopathy and hyperlipidemia. His last Myoview was performed in July of 2011. At that time, he had an ejection fraction of 28%. There was a previous infarct involving the anterior wall, apex, and inferior wall with minimal ischemia noted. I last saw him in June of 2013 and he was complaining of increased SOB. Echocardiogram in July of 2013 showed an ejection fraction of 20-25%. There was mild left ventricular enlargement,  Moderate to severe left atrial enlargement and mildly reduced RV function. Hemoglobin 14.9, renal function normal and BNP 323. Diuretics were increased. Since then, his dyspnea on exertion has improved. There is no orthopnea, chest pain or syncope. His pedal edema has also improved.   Current Outpatient Prescriptions  Medication Sig Dispense Refill  . acetaminophen (TYLENOL) 500 MG tablet Take 500 mg by mouth every 6 (six) hours as needed.        Marland Kitchen allopurinol (ZYLOPRIM) 100 MG tablet Take 100 mg by mouth daily.        . calcium carbonate (OS-CAL) 600 MG TABS Take 600 mg by mouth daily.        . carvedilol (COREG) 25 MG tablet Half a tablet two times a day       . COLCRYS 0.6 MG tablet Take 0.6 mg by mouth 2 (two) times daily as needed.       . digoxin (LANOXIN) 0.125 MG tablet Take 1 tablet (125 mcg total) by mouth daily.  30 tablet  3  . famotidine (PEPCID) 10 MG tablet Take 10 mg by mouth 2 (two) times daily.        Marland Kitchen FLUoxetine (PROZAC) 10 MG capsule Take 10 mg by mouth daily.        . folic acid (FOLVITE) A999333 MCG tablet Take 400 mcg by mouth daily.        . furosemide (LASIX) 40 MG tablet TWO TABLETS TWICE DAILY FOR THE NEXT TWO DAYS THEN ONE AND ONE HALF TABLET TWICE DAILY  280 tablet  4  . insulin glargine (LANTUS) 100 UNIT/ML injection Sliding scale      . loratadine (CLARITIN) 10 MG tablet Take 10 mg by mouth as needed.         . Multiple Vitamin (MULTIVITAMIN) tablet Take 2 tablets by mouth daily.        . niacin (NIASPAN) 750 MG CR tablet Take 750 mg by mouth 2 (two) times daily.        Marland Kitchen omega-3 acid ethyl esters (LOVAZA) 1 G capsule Take 2 g by mouth 2 (two) times daily.        . potassium chloride SA (K-DUR,KLOR-CON) 20 MEQ tablet TAKE TWO TABLETS TODAY AND THEN ONE TABLET DAILY  90 tablet  3  . ramipril (ALTACE) 2.5 MG capsule Take 1 capsule (2.5 mg total) by mouth daily.  90 capsule  3  . simvastatin (ZOCOR) 20 MG tablet Take 20 mg by mouth daily.        Marland Kitchen spironolactone (ALDACTONE) 25 MG tablet Take 25 mg by mouth daily.        Marland Kitchen warfarin (COUMADIN) 5 MG tablet TAKE AS DIRECTED BY ANTICOAGULATION CLINIC  90 tablet  1     Past Medical History  Diagnosis Date  . Diabetes mellitus type II   . Gout   . Myocardial infarction 2000  . Ventricular tachycardia   .  Ischemic heart disease   . Congestive heart failure   . Left bundle branch block   . Hyperlipidemia   . Implantable cardiac defibrillator   11/28/2011    Past Surgical History  Procedure Date  . Coronary artery bypass graft   . Urecal cyst resection   . Inguinal herniorrhapy   . Tonsillectomy   . Vasectomy   . A-v cardiac pacemaker insertion     History   Social History  . Marital Status: Married    Spouse Name: N/A    Number of Children: N/A  . Years of Education: N/A   Occupational History  . Not on file.   Social History Main Topics  . Smoking status: Never Smoker   . Smokeless tobacco: Former Systems developer    Types: Snuff, Chew   Comment: for 15 years  . Alcohol Use: Yes  . Drug Use: No  . Sexually Active: Not on file   Other Topics Concern  . Not on file   Social History Narrative  . No narrative on file    ROS: no fevers or chills, productive cough, hemoptysis, dysphasia, odynophagia, melena, hematochezia, dysuria, hematuria, rash, seizure activity, orthopnea, PND, pedal edema, claudication. Remaining systems are  negative.  Physical Exam: Well-developed well-nourished in no acute distress.  Skin is warm and dry.  HEENT is normal.  Neck is supple. Chest is clear to auscultation with normal expansion.  Cardiovascular exam is regular rate and rhythm.  Abdominal exam nontender or distended. No masses palpated. Extremities show no edema. neuro grossly intact

## 2012-07-16 NOTE — Assessment & Plan Note (Signed)
Blood pressure controlled. Continue present medications. 

## 2012-07-16 NOTE — Assessment & Plan Note (Signed)
Patient symptomatically improved. Continue Lasix at 80 mg twice a day. Potassium supplement added yesterday. Check potassium and renal function in one week. He will take an additional 40 mg of Lasix daily as needed for weight gain of 2 pounds. Continue low-sodium diet.

## 2012-07-16 NOTE — Assessment & Plan Note (Signed)
Plan continue beta blocker and Coumadin.

## 2012-07-16 NOTE — Patient Instructions (Addendum)
Your physician recommends that you schedule a follow-up appointment in: [place]  Your physician recommends that you return for lab work in: [place]

## 2012-07-22 ENCOUNTER — Other Ambulatory Visit (INDEPENDENT_AMBULATORY_CARE_PROVIDER_SITE_OTHER): Payer: Medicare Other

## 2012-07-22 DIAGNOSIS — E876 Hypokalemia: Secondary | ICD-10-CM

## 2012-07-22 LAB — BASIC METABOLIC PANEL
BUN: 22 mg/dL (ref 6–23)
Calcium: 8.9 mg/dL (ref 8.4–10.5)
GFR: 59.71 mL/min — ABNORMAL LOW (ref >60.00)
Glucose, Bld: 122 mg/dL — ABNORMAL HIGH (ref 70–99)

## 2012-07-23 ENCOUNTER — Telehealth: Payer: Self-pay | Admitting: *Deleted

## 2012-07-23 NOTE — Telephone Encounter (Signed)
Advised patient of lab results  

## 2012-07-23 NOTE — Telephone Encounter (Signed)
Message copied by Earvin Hansen on Fri Jul 23, 2012  9:05 AM ------      Message from: Lelon Perla      Created: Thu Jul 22, 2012  5:33 PM       [place]

## 2012-08-12 ENCOUNTER — Ambulatory Visit (INDEPENDENT_AMBULATORY_CARE_PROVIDER_SITE_OTHER): Payer: Medicare Other

## 2012-08-12 DIAGNOSIS — Z7901 Long term (current) use of anticoagulants: Secondary | ICD-10-CM

## 2012-08-12 DIAGNOSIS — I4891 Unspecified atrial fibrillation: Secondary | ICD-10-CM

## 2012-09-13 ENCOUNTER — Other Ambulatory Visit: Payer: Self-pay

## 2012-09-13 ENCOUNTER — Encounter: Payer: Self-pay | Admitting: Internal Medicine

## 2012-09-13 ENCOUNTER — Ambulatory Visit (INDEPENDENT_AMBULATORY_CARE_PROVIDER_SITE_OTHER): Payer: Medicare Other | Admitting: *Deleted

## 2012-09-13 DIAGNOSIS — Z95810 Presence of automatic (implantable) cardiac defibrillator: Secondary | ICD-10-CM

## 2012-09-13 DIAGNOSIS — I5023 Acute on chronic systolic (congestive) heart failure: Secondary | ICD-10-CM

## 2012-09-13 DIAGNOSIS — I2589 Other forms of chronic ischemic heart disease: Secondary | ICD-10-CM

## 2012-09-13 MED ORDER — WARFARIN SODIUM 5 MG PO TABS: ORAL_TABLET | ORAL | Status: DC

## 2012-09-14 ENCOUNTER — Encounter: Payer: Self-pay | Admitting: *Deleted

## 2012-09-14 LAB — REMOTE ICD DEVICE
HV IMPEDENCE: 45 Ohm
RV LEAD AMPLITUDE: 12 mv
RV LEAD IMPEDENCE ICD: 660 Ohm
TZAT-0004SLOWVT: 8
TZAT-0012SLOWVT: 200 ms
TZAT-0013FASTVT: 1
TZAT-0013SLOWVT: 3
TZAT-0018FASTVT: NEGATIVE
TZON-0003FASTVT: 285 ms
TZST-0001FASTVT: 2
TZST-0001FASTVT: 3
TZST-0001SLOWVT: 5
TZST-0003FASTVT: 40 J
TZST-0003FASTVT: 40 J
TZST-0003FASTVT: 40 J
TZST-0003SLOWVT: 20 J
TZST-0003SLOWVT: 30 J

## 2012-09-17 ENCOUNTER — Other Ambulatory Visit: Payer: Self-pay | Admitting: *Deleted

## 2012-09-17 MED ORDER — WARFARIN SODIUM 5 MG PO TABS: ORAL_TABLET | ORAL | Status: DC

## 2012-09-21 ENCOUNTER — Encounter: Payer: Self-pay | Admitting: *Deleted

## 2012-10-07 ENCOUNTER — Ambulatory Visit (INDEPENDENT_AMBULATORY_CARE_PROVIDER_SITE_OTHER): Payer: Medicare Other | Admitting: *Deleted

## 2012-10-07 DIAGNOSIS — Z7901 Long term (current) use of anticoagulants: Secondary | ICD-10-CM

## 2012-10-07 DIAGNOSIS — I4891 Unspecified atrial fibrillation: Secondary | ICD-10-CM

## 2012-10-18 ENCOUNTER — Ambulatory Visit (INDEPENDENT_AMBULATORY_CARE_PROVIDER_SITE_OTHER): Payer: Medicare Other | Admitting: Cardiology

## 2012-10-18 ENCOUNTER — Encounter: Payer: Self-pay | Admitting: Cardiology

## 2012-10-18 VITALS — BP 116/75 | HR 65 | Ht 65.0 in | Wt 183.0 lb

## 2012-10-18 DIAGNOSIS — I5023 Acute on chronic systolic (congestive) heart failure: Secondary | ICD-10-CM

## 2012-10-18 DIAGNOSIS — Z95810 Presence of automatic (implantable) cardiac defibrillator: Secondary | ICD-10-CM

## 2012-10-18 DIAGNOSIS — E782 Mixed hyperlipidemia: Secondary | ICD-10-CM

## 2012-10-18 DIAGNOSIS — I10 Essential (primary) hypertension: Secondary | ICD-10-CM

## 2012-10-18 DIAGNOSIS — I2510 Atherosclerotic heart disease of native coronary artery without angina pectoris: Secondary | ICD-10-CM

## 2012-10-18 DIAGNOSIS — I4891 Unspecified atrial fibrillation: Secondary | ICD-10-CM

## 2012-10-18 LAB — BASIC METABOLIC PANEL
Chloride: 96 mEq/L (ref 96–112)
GFR: 57.55 mL/min — ABNORMAL LOW (ref >60.00)
Glucose, Bld: 129 mg/dL — ABNORMAL HIGH (ref 70–99)
Potassium: 3.8 mEq/L (ref 3.5–5.1)
Sodium: 135 mEq/L (ref 135–145)

## 2012-10-18 NOTE — Assessment & Plan Note (Signed)
Continue present dose of diuretic, ACE inhibitor and beta blocker. Check potassium and renal function.

## 2012-10-18 NOTE — Progress Notes (Signed)
HPI:  Mason Arnold is a pleasant gentleman who has a history of coronary artery disease status post coronary bypass graft, as well as an ischemic cardiomyopathy and hyperlipidemia. His last Myoview was performed in July of 2011. At that time, he had an ejection fraction of 28%. There was a previous infarct involving the anterior wall, apex, and inferior wall with minimal ischemia noted. Echocardiogram in July of 2013 showed an ejection fraction of 20-25%. There was mild left ventricular enlargement, Moderate to severe left atrial enlargement and mildly reduced RV function. Hemoglobin 14.9, renal function normal and BNP 323. Since I last saw him in August of 2013, the patient has dyspnea with more extreme activities but not with routine activities. It is relieved with rest. It is not associated with chest pain. There is no orthopnea, PND or pedal edema. There is no syncope or palpitations. There is no exertional chest pain.   Current Outpatient Prescriptions  Medication Sig Dispense Refill  . acetaminophen (TYLENOL) 500 MG tablet Take 500 mg by mouth every 6 (six) hours as needed.        Marland Kitchen allopurinol (ZYLOPRIM) 100 MG tablet Take 100 mg by mouth daily.        . calcium carbonate (OS-CAL) 600 MG TABS Take 600 mg by mouth daily.        . carvedilol (COREG) 25 MG tablet Half a tablet two times a day       . COLCRYS 0.6 MG tablet Take 0.6 mg by mouth 2 (two) times daily as needed.       . famotidine (PEPCID) 10 MG tablet Take 10 mg by mouth 2 (two) times daily.        Marland Kitchen FLUoxetine (PROZAC) 10 MG capsule Take 10 mg by mouth daily.        . folic acid (FOLVITE) A999333 MCG tablet Take 400 mcg by mouth daily.        . furosemide (LASIX) 40 MG tablet ONE AND ONE HALF TABLET TWICE DAILY      . insulin glargine (LANTUS) 100 UNIT/ML injection Sliding scale      . loratadine (CLARITIN) 10 MG tablet Take 10 mg by mouth as needed.        . Multiple Vitamin (MULTIVITAMIN) tablet Take 2 tablets by mouth daily.         . niacin (NIASPAN) 750 MG CR tablet Take 750 mg by mouth 2 (two) times daily.        Marland Kitchen omega-3 acid ethyl esters (LOVAZA) 1 G capsule Take 2 g by mouth 2 (two) times daily.        . potassium chloride SA (K-DUR,KLOR-CON) 20 MEQ tablet ONE TABLET DAILY      . ramipril (ALTACE) 2.5 MG capsule Take 1 capsule (2.5 mg total) by mouth daily.  90 capsule  3  . simvastatin (ZOCOR) 20 MG tablet Take 20 mg by mouth daily.        Marland Kitchen spironolactone (ALDACTONE) 25 MG tablet Take 25 mg by mouth daily.        Marland Kitchen warfarin (COUMADIN) 5 MG tablet Take as directed by anticoagulation clinic  90 tablet  2  . [DISCONTINUED] furosemide (LASIX) 40 MG tablet TWO TABLETS TWICE DAILY FOR THE NEXT TWO DAYS THEN ONE AND ONE HALF TABLET TWICE DAILY  280 tablet  4  . [DISCONTINUED] potassium chloride SA (K-DUR,KLOR-CON) 20 MEQ tablet TAKE TWO TABLETS TODAY AND THEN ONE TABLET DAILY  90 tablet  3  Past Medical History  Diagnosis Date  . Diabetes mellitus type II   . Gout   . Myocardial infarction 2000  . Ventricular tachycardia   . Ischemic heart disease   . Congestive heart failure   . Left bundle branch block   . Hyperlipidemia   . Implantable cardiac defibrillator   11/28/2011    Past Surgical History  Procedure Date  . Coronary artery bypass graft   . Urecal cyst resection   . Inguinal herniorrhapy   . Tonsillectomy   . Vasectomy   . A-v cardiac pacemaker insertion     History   Social History  . Marital Status: Married    Spouse Name: N/A    Number of Children: N/A  . Years of Education: N/A   Occupational History  . Not on file.   Social History Main Topics  . Smoking status: Never Smoker   . Smokeless tobacco: Former Systems developer    Types: Snuff, Chew     Comment: for 15 years  . Alcohol Use: Yes  . Drug Use: No  . Sexually Active: Not on file   Other Topics Concern  . Not on file   Social History Narrative  . No narrative on file    ROS: no fevers or chills, productive cough,  hemoptysis, dysphasia, odynophagia, melena, hematochezia, dysuria, hematuria, rash, seizure activity, orthopnea, PND, pedal edema, claudication. Remaining systems are negative.  Physical Exam: Well-developed well-nourished in no acute distress.  Skin is warm and dry.  HEENT is normal.  Neck is supple.  Chest is clear to auscultation with normal expansion. Defibrillator left chest Cardiovascular exam is regular rate and rhythm.  Abdominal exam nontender or distended. No masses palpated. Extremities show no edema. neuro grossly intact  ECG atrial paced rhythm, left bundle branch block.

## 2012-10-18 NOTE — Assessment & Plan Note (Signed)
Continue statin. Not on aspirin given need for anticoagulation. 

## 2012-10-18 NOTE — Assessment & Plan Note (Signed)
Patient will followup with Dr. Caryl Comes in December. He would most likely benefit to CRT upgrade.

## 2012-10-18 NOTE — Assessment & Plan Note (Signed)
Continue statin. 

## 2012-10-18 NOTE — Assessment & Plan Note (Signed)
Patient in atrial paced rhythm today. Continue beta blocker. Continue Coumadin. He will check with his insurance company to see if one of the newer anticoagulants are covered. If so we will initiate and discontinue Coumadin.

## 2012-10-18 NOTE — Assessment & Plan Note (Signed)
Blood pressure controlled. Continue present medications. 

## 2012-10-18 NOTE — Patient Instructions (Addendum)
Your physician wants you to follow-up in: [place] will receive a reminder letter in the mail two months in advance. If you don't receive a letter, please call our office to schedule the follow-up appointment.   XARELTO OR PRADAXA OR ELIQUIS TO REPLACE COUMADIN

## 2012-11-18 ENCOUNTER — Ambulatory Visit (INDEPENDENT_AMBULATORY_CARE_PROVIDER_SITE_OTHER): Payer: Medicare Other | Admitting: *Deleted

## 2012-11-18 DIAGNOSIS — I4891 Unspecified atrial fibrillation: Secondary | ICD-10-CM

## 2012-11-18 DIAGNOSIS — Z7901 Long term (current) use of anticoagulants: Secondary | ICD-10-CM

## 2012-11-18 LAB — POCT INR: INR: 2.7

## 2012-11-30 ENCOUNTER — Ambulatory Visit (INDEPENDENT_AMBULATORY_CARE_PROVIDER_SITE_OTHER): Payer: Medicare Other | Admitting: Internal Medicine

## 2012-11-30 ENCOUNTER — Encounter: Payer: Self-pay | Admitting: *Deleted

## 2012-11-30 ENCOUNTER — Encounter: Payer: Self-pay | Admitting: Internal Medicine

## 2012-11-30 VITALS — BP 107/67 | HR 60 | Resp 18 | Ht 65.0 in | Wt 182.0 lb

## 2012-11-30 DIAGNOSIS — I5023 Acute on chronic systolic (congestive) heart failure: Secondary | ICD-10-CM

## 2012-11-30 DIAGNOSIS — I255 Ischemic cardiomyopathy: Secondary | ICD-10-CM

## 2012-11-30 DIAGNOSIS — F329 Major depressive disorder, single episode, unspecified: Secondary | ICD-10-CM | POA: Insufficient documentation

## 2012-11-30 DIAGNOSIS — Z95810 Presence of automatic (implantable) cardiac defibrillator: Secondary | ICD-10-CM

## 2012-11-30 DIAGNOSIS — I2589 Other forms of chronic ischemic heart disease: Secondary | ICD-10-CM

## 2012-11-30 LAB — ICD DEVICE OBSERVATION
AL AMPLITUDE: 3.4 mv
DEVICE MODEL ICD: 733092
FVT: 0
MODE SWITCH EPISODES: 0
TZAT-0013SLOWVT: 3
TZAT-0018FASTVT: NEGATIVE
TZAT-0018SLOWVT: NEGATIVE
TZAT-0019FASTVT: 7.5 V
TZAT-0020FASTVT: 1 ms
TZON-0003FASTVT: 285 ms
TZON-0003SLOWVT: 350 ms
TZON-0004FASTVT: 24
TZON-0005FASTVT: 6
TZON-0010FASTVT: 80 ms
TZST-0001FASTVT: 2
TZST-0001FASTVT: 3
TZST-0001FASTVT: 4
TZST-0001SLOWVT: 2
TZST-0001SLOWVT: 3
TZST-0001SLOWVT: 5
TZST-0003FASTVT: 40 J
TZST-0003FASTVT: 40 J
TZST-0003SLOWVT: 30 J
VF: 0

## 2012-11-30 MED ORDER — RAMIPRIL 2.5 MG PO CAPS: 2.5000 mg | ORAL_CAPSULE | Freq: Every day | ORAL | Status: DC

## 2012-11-30 NOTE — Assessment & Plan Note (Signed)
As above.

## 2012-11-30 NOTE — Assessment & Plan Note (Signed)
The patient has had some pacemaker mediated tachycardia which has required adjustment. We have also discussed CRT upgrade. We discussed risks and benefits. He would like to proceed. His QRS duration is 146 ms. This is within appropriate range for consideration of left bundle branch block upgrade with his class III heart failure

## 2012-11-30 NOTE — Assessment & Plan Note (Signed)
She denies active suicidality but is admitting to being quite depressed. I have been in touch with his PCP about up titrating his antidepressants and for consideration of counseling he will let us know

## 2012-11-30 NOTE — Progress Notes (Signed)
Patient Care Team: Hendricks Limes, MD as PCP - General   HPI  Mason Arnold is a 71 y.o. male he is seen in followup for ischemic cardiomyopathy status post coronary bypass graft,  he is status post ICD implantation; underwent generator replacement in October 2010.  His last Myoview was performed in July of 2011. At that time, he had an ejection fraction of 28%. There was a previous infarct involving the anterior wall, apex, and inferior wall with minimal ischemia noted  Echocardiogram in July of 2013 showed an ejection fraction of 20-25%. There was mild left ventricular enlargement, Moderate to severe left atrial enlargement and mildly reduced RV function  He has dyspnea on exertion wth LBBB at 146 msec  He posits repeatedly during our discussion questions like is worth it, why should I try, I am too tired.  He admits to depression, and passive death thoughts but denies active suicidality   Past Medical History  Diagnosis Date  . Diabetes mellitus type II   . Gout   . Myocardial infarction 2000  . Ventricular tachycardia   . Ischemic heart disease   . Congestive heart failure   . Left bundle branch block   . Hyperlipidemia   . Implantable cardiac defibrillator   11/28/2011    Past Surgical History  Procedure Date  . Coronary artery bypass graft   . Urecal cyst resection   . Inguinal herniorrhapy   . Tonsillectomy   . Vasectomy   . A-v cardiac pacemaker insertion     Current Outpatient Prescriptions  Medication Sig Dispense Refill  . acetaminophen (TYLENOL) 500 MG tablet Take 500 mg by mouth every 6 (six) hours as needed.        Marland Kitchen allopurinol (ZYLOPRIM) 100 MG tablet Take 100 mg by mouth daily.        . calcium carbonate (OS-CAL) 600 MG TABS Take 600 mg by mouth daily.        . carvedilol (COREG) 25 MG tablet Half a tablet two times a day       . COLCRYS 0.6 MG tablet Take 0.6 mg by mouth 2 (two) times daily as needed.       . famotidine (PEPCID) 10 MG tablet  Take 10 mg by mouth 2 (two) times daily.        Marland Kitchen FLUoxetine (PROZAC) 10 MG capsule Take 10 mg by mouth daily.        . folic acid (FOLVITE) A999333 MCG tablet Take 400 mcg by mouth daily.        . furosemide (LASIX) 40 MG tablet ONE AND ONE HALF TABLET TWICE DAILY      . insulin glargine (LANTUS) 100 UNIT/ML injection Sliding scale      . loratadine (CLARITIN) 10 MG tablet Take 10 mg by mouth as needed.        . Multiple Vitamin (MULTIVITAMIN) tablet Take 2 tablets by mouth daily.        . niacin (NIASPAN) 750 MG CR tablet Take 750 mg by mouth 2 (two) times daily.        Marland Kitchen omega-3 acid ethyl esters (LOVAZA) 1 G capsule Take 2 g by mouth 2 (two) times daily.        . potassium chloride SA (K-DUR,KLOR-CON) 20 MEQ tablet ONE TABLET DAILY      . ramipril (ALTACE) 2.5 MG capsule Take 1 capsule (2.5 mg total) by mouth daily.  90 capsule  3  . simvastatin (ZOCOR) 20 MG tablet Take  20 mg by mouth daily.        Marland Kitchen spironolactone (ALDACTONE) 25 MG tablet Take 25 mg by mouth daily.        Marland Kitchen warfarin (COUMADIN) 5 MG tablet Take as directed by anticoagulation clinic  90 tablet  2    Allergies  Allergen Reactions  . Ampicillin     RASH  . Crestor (Rosuvastatin Calcium)     Rash as nodules ("like strawberries")  . Propoxyphene-Acetaminophen     hallucinations      Review of Systems negative except from HPI and PMH  Physical Exam BP 107/67  Pulse 60  Resp 18  Ht 5\' 5"  (1.651 m)  Wt 182 lb (82.555 kg)  BMI 30.29 kg/m2  SpO2 93% Well developed and well nourished in no acute distress HENT normal E scleral and icterus clear Neck Supple JVP 8-9; carotids brisk and full Clear to ausculation  egular rate and rhythm, no murmurs gallops or rub Soft with active bowel sounds No clubbing cyanosis none Edema Alert and oriented, grossly normal motor and sensory function Skin Warm and Dry affect sad   Assessment and  Plan

## 2012-11-30 NOTE — Assessment & Plan Note (Signed)
He is on guideline directed medical therapy. I think is reasonable to consider CRT upgrade as noted above

## 2012-12-02 ENCOUNTER — Encounter (HOSPITAL_COMMUNITY): Payer: Self-pay | Admitting: Pharmacy Technician

## 2012-12-10 ENCOUNTER — Telehealth: Payer: Self-pay | Admitting: *Deleted

## 2012-12-10 MED ORDER — FLUOXETINE HCL 20 MG PO CAPS: 20.0000 mg | ORAL_CAPSULE | Freq: Every day | ORAL | Status: DC

## 2012-12-10 NOTE — Telephone Encounter (Signed)
12/10/2012  16:39pm  Pt advised to increase Prozac to 20mg  daily. New RX sent to [facility].  Pt advised to follow up with Dr. Linna Darner soon, pt agreed.   Could you please call and change his dose prozac to 20 and have him f/u with Dr Linna Darner thanks   ----- Message ----- From: Hendricks Limes, MD Sent: 11/30/2012 11:55 AM To: Deboraha Sprang, MD 20 mg is minimal dose ; please ask your Nurse to recommend this with F/U appt here .Thanks for caring so much . Merry Christmas . [place]   ----- Message ----- From: Deboraha Sprang, MD Sent: 11/30/2012 11:29 AM To: Hendricks Limes, MD [place] Jarred Christmas. Mr. Conroy and I had a long discussion today. Depression is quite overwhelming at this point. He has passive but no active suicidal thoughts. He is Prozac doses 10; he said helped at the beginning. I wonder whether increasing it would be helpful. He will also consider counseling and later you or me know about that. We will be working on helping his heart failure steve

## 2012-12-13 ENCOUNTER — Other Ambulatory Visit: Payer: Self-pay | Admitting: *Deleted

## 2012-12-13 ENCOUNTER — Other Ambulatory Visit (INDEPENDENT_AMBULATORY_CARE_PROVIDER_SITE_OTHER): Payer: Medicare Other

## 2012-12-13 DIAGNOSIS — I4891 Unspecified atrial fibrillation: Secondary | ICD-10-CM

## 2012-12-13 LAB — BASIC METABOLIC PANEL
BUN: 24 mg/dL — ABNORMAL HIGH (ref 6–23)
Calcium: 9.6 mg/dL (ref 8.4–10.5)
Creatinine, Ser: 1.5 mg/dL (ref 0.4–1.5)
GFR: 50.72 mL/min — ABNORMAL LOW (ref >60.00)
Glucose, Bld: 145 mg/dL — ABNORMAL HIGH (ref 70–99)
Potassium: 4.7 mEq/L (ref 3.5–5.1)

## 2012-12-13 LAB — CBC WITH DIFFERENTIAL/PLATELET
Basophils Relative: 0.4 % (ref 0.0–3.0)
Eosinophils Absolute: 0.3 10*3/uL (ref 0.0–0.7)
Eosinophils Relative: 3.3 % (ref 0.0–5.0)
HCT: 46.8 % (ref 39.0–52.0)
Lymphs Abs: 1.7 10*3/uL (ref 0.7–4.0)
MCHC: 34.6 g/dL (ref 30.0–36.0)
MCV: 94.7 fl (ref 78.0–100.0)
Monocytes Absolute: 0.7 10*3/uL (ref 0.1–1.0)
Neutrophils Relative %: 71.5 % (ref 43.0–77.0)
RBC: 4.94 Mil/uL (ref 4.22–5.81)
WBC: 9.8 10*3/uL (ref 4.5–10.5)

## 2012-12-13 LAB — PROTIME-INR: Prothrombin Time: 16.8 s — ABNORMAL HIGH (ref 10.2–12.4)

## 2012-12-15 MED ORDER — VANCOMYCIN HCL IN DEXTROSE 1-5 GM/200ML-% IV SOLN
1000.0000 mg | INTRAVENOUS | Status: DC
  Filled 2012-12-15: qty 200

## 2012-12-15 MED ORDER — SODIUM CHLORIDE 0.9 % IJ SOLN: 3.0000 mL | Freq: Two times a day (BID) | INTRAMUSCULAR | Status: DC

## 2012-12-15 MED ORDER — SODIUM CHLORIDE 0.9 % IR SOLN
80.0000 mg | Status: DC
  Filled 2012-12-15: qty 2

## 2012-12-15 MED ORDER — SODIUM CHLORIDE 0.45 % IV SOLN
INTRAVENOUS | Status: DC
  Administered 2012-12-16: 06:00:00 via INTRAVENOUS

## 2012-12-15 MED ORDER — SODIUM CHLORIDE 0.9 % IV SOLN: 250.0000 mL | INTRAVENOUS | Status: DC

## 2012-12-15 MED ORDER — CHLORHEXIDINE GLUCONATE 4 % EX LIQD
60.0000 mL | Freq: Once | CUTANEOUS | Status: DC
  Filled 2012-12-15: qty 60

## 2012-12-15 MED ORDER — SODIUM CHLORIDE 0.9 % IJ SOLN: 3.0000 mL | INTRAMUSCULAR | Status: DC | PRN

## 2012-12-16 ENCOUNTER — Ambulatory Visit (HOSPITAL_COMMUNITY)
Admission: RE | Admit: 2012-12-16 | Discharge: 2012-12-17 | Disposition: A | Discharge status: Home / Self Care | Payer: Medicare Other | Source: Ambulatory Visit | Attending: Internal Medicine | Admitting: Internal Medicine

## 2012-12-16 ENCOUNTER — Encounter (HOSPITAL_COMMUNITY): Payer: Self-pay | Admitting: General Practice

## 2012-12-16 ENCOUNTER — Ambulatory Visit (HOSPITAL_COMMUNITY): Payer: Medicare Other

## 2012-12-16 ENCOUNTER — Encounter (HOSPITAL_COMMUNITY)
Admission: RE | Disposition: A | Discharge status: Home / Self Care | Payer: Self-pay | Source: Ambulatory Visit | Attending: Internal Medicine

## 2012-12-16 DIAGNOSIS — Z95810 Presence of automatic (implantable) cardiac defibrillator: Secondary | ICD-10-CM

## 2012-12-16 DIAGNOSIS — I2510 Atherosclerotic heart disease of native coronary artery without angina pectoris: Secondary | ICD-10-CM | POA: Insufficient documentation

## 2012-12-16 DIAGNOSIS — I4729 Other ventricular tachycardia: Secondary | ICD-10-CM | POA: Insufficient documentation

## 2012-12-16 DIAGNOSIS — F329 Major depressive disorder, single episode, unspecified: Secondary | ICD-10-CM

## 2012-12-16 DIAGNOSIS — D489 Neoplasm of uncertain behavior, unspecified: Secondary | ICD-10-CM

## 2012-12-16 DIAGNOSIS — I509 Heart failure, unspecified: Secondary | ICD-10-CM | POA: Insufficient documentation

## 2012-12-16 DIAGNOSIS — I2589 Other forms of chronic ischemic heart disease: Secondary | ICD-10-CM | POA: Insufficient documentation

## 2012-12-16 DIAGNOSIS — Z7901 Long term (current) use of anticoagulants: Secondary | ICD-10-CM

## 2012-12-16 DIAGNOSIS — I255 Ischemic cardiomyopathy: Secondary | ICD-10-CM

## 2012-12-16 DIAGNOSIS — I10 Essential (primary) hypertension: Secondary | ICD-10-CM

## 2012-12-16 DIAGNOSIS — I951 Orthostatic hypotension: Secondary | ICD-10-CM

## 2012-12-16 DIAGNOSIS — E119 Type 2 diabetes mellitus without complications: Secondary | ICD-10-CM | POA: Insufficient documentation

## 2012-12-16 DIAGNOSIS — E782 Mixed hyperlipidemia: Secondary | ICD-10-CM

## 2012-12-16 DIAGNOSIS — R7989 Other specified abnormal findings of blood chemistry: Secondary | ICD-10-CM

## 2012-12-16 DIAGNOSIS — M109 Gout, unspecified: Secondary | ICD-10-CM

## 2012-12-16 DIAGNOSIS — I5023 Acute on chronic systolic (congestive) heart failure: Secondary | ICD-10-CM

## 2012-12-16 DIAGNOSIS — I472 Ventricular tachycardia, unspecified: Secondary | ICD-10-CM | POA: Insufficient documentation

## 2012-12-16 DIAGNOSIS — E785 Hyperlipidemia, unspecified: Secondary | ICD-10-CM | POA: Insufficient documentation

## 2012-12-16 DIAGNOSIS — I447 Left bundle-branch block, unspecified: Secondary | ICD-10-CM | POA: Insufficient documentation

## 2012-12-16 DIAGNOSIS — Z862 Personal history of diseases of the blood and blood-forming organs and certain disorders involving the immune mechanism: Secondary | ICD-10-CM

## 2012-12-16 DIAGNOSIS — E8881 Metabolic syndrome: Secondary | ICD-10-CM

## 2012-12-16 DIAGNOSIS — I4891 Unspecified atrial fibrillation: Secondary | ICD-10-CM

## 2012-12-16 HISTORY — DX: Angina pectoris, unspecified: I20.9

## 2012-12-16 HISTORY — DX: Essential (primary) hypertension: I10

## 2012-12-16 HISTORY — PX: INSERT / REPLACE / REMOVE PACEMAKER: SUR710

## 2012-12-16 HISTORY — PX: BIV ICD GENERTAOR CHANGE OUT: SHX5745

## 2012-12-16 LAB — PROTIME-INR: Prothrombin Time: 13.7 seconds (ref 11.6–15.2)

## 2012-12-16 LAB — GLUCOSE, CAPILLARY: Glucose-Capillary: 110 mg/dL — ABNORMAL HIGH (ref 70–99)

## 2012-12-16 SURGERY — BIV ICD GENERTAOR CHANGE OUT
Anesthesia: LOCAL

## 2012-12-16 MED ORDER — CALCIUM CARBONATE 600 MG PO TABS
600.0000 mg | ORAL_TABLET | Freq: Every day | ORAL | Status: DC
  Filled 2012-12-16: qty 1

## 2012-12-16 MED ORDER — FUROSEMIDE 10 MG/ML IJ SOLN
80.0000 mg | Freq: Two times a day (BID) | INTRAMUSCULAR | Status: AC
  Administered 2012-12-16: 80 mg via INTRAVENOUS
  Filled 2012-12-16: qty 8

## 2012-12-16 MED ORDER — RAMIPRIL 2.5 MG PO CAPS
2.5000 mg | ORAL_CAPSULE | Freq: Every day | ORAL | Status: DC
  Administered 2012-12-17: 2.5 mg via ORAL
  Filled 2012-12-16 (×2): qty 1

## 2012-12-16 MED ORDER — MUPIROCIN 2 % EX OINT
TOPICAL_OINTMENT | Freq: Two times a day (BID) | CUTANEOUS | Status: DC
  Administered 2012-12-16: 23:00:00 via NASAL
  Filled 2012-12-16 (×2): qty 22

## 2012-12-16 MED ORDER — OMEGA-3-ACID ETHYL ESTERS 1 G PO CAPS
2.0000 g | ORAL_CAPSULE | Freq: Two times a day (BID) | ORAL | Status: DC
  Administered 2012-12-16 – 2012-12-17 (×3): 2 g via ORAL
  Filled 2012-12-16 (×4): qty 2

## 2012-12-16 MED ORDER — FENTANYL CITRATE 0.05 MG/ML IJ SOLN
INTRAMUSCULAR | Status: AC
Start: 2012-12-16 — End: 2012-12-16
  Filled 2012-12-16: qty 2

## 2012-12-16 MED ORDER — CALCIUM CARBONATE 1250 (500 CA) MG PO TABS
1.0000 | ORAL_TABLET | Freq: Every day | ORAL | Status: DC
  Administered 2012-12-17: 500 mg via ORAL
  Filled 2012-12-16 (×2): qty 1

## 2012-12-16 MED ORDER — WARFARIN SODIUM 2.5 MG PO TABS: 2.5000 mg | ORAL_TABLET | Freq: Every day | ORAL | Status: DC

## 2012-12-16 MED ORDER — FLUOXETINE HCL 20 MG PO CAPS
20.0000 mg | ORAL_CAPSULE | Freq: Every day | ORAL | Status: DC
  Administered 2012-12-16 – 2012-12-17 (×2): 20 mg via ORAL
  Filled 2012-12-16 (×2): qty 1

## 2012-12-16 MED ORDER — WARFARIN SODIUM 5 MG PO TABS
5.0000 mg | ORAL_TABLET | ORAL | Status: AC
  Administered 2012-12-16: 5 mg via ORAL
  Filled 2012-12-16: qty 1

## 2012-12-16 MED ORDER — SODIUM CHLORIDE 0.9 % IV SOLN: INTRAVENOUS | Status: AC

## 2012-12-16 MED ORDER — WARFARIN SODIUM 2.5 MG PO TABS
2.5000 mg | ORAL_TABLET | ORAL | Status: DC
  Filled 2012-12-16: qty 1

## 2012-12-16 MED ORDER — WARFARIN - PHYSICIAN DOSING INPATIENT: Freq: Every day | Status: DC

## 2012-12-16 MED ORDER — INSULIN GLARGINE 100 UNIT/ML ~~LOC~~ SOLN: 88.0000 [IU] | Freq: Every day | SUBCUTANEOUS | Status: DC

## 2012-12-16 MED ORDER — ALLOPURINOL 100 MG PO TABS
100.0000 mg | ORAL_TABLET | Freq: Every day | ORAL | Status: DC
  Administered 2012-12-16 – 2012-12-17 (×2): 100 mg via ORAL
  Filled 2012-12-16 (×2): qty 1

## 2012-12-16 MED ORDER — FAMOTIDINE 10 MG PO TABS
10.0000 mg | ORAL_TABLET | Freq: Every day | ORAL | Status: DC
  Administered 2012-12-16 – 2012-12-17 (×2): 10 mg via ORAL
  Filled 2012-12-16 (×2): qty 1

## 2012-12-16 MED ORDER — HEPARIN (PORCINE) IN NACL 2-0.9 UNIT/ML-% IJ SOLN
INTRAMUSCULAR | Status: AC
  Filled 2012-12-16: qty 500

## 2012-12-16 MED ORDER — CARVEDILOL 12.5 MG PO TABS
12.5000 mg | ORAL_TABLET | Freq: Two times a day (BID) | ORAL | Status: DC
  Administered 2012-12-16 – 2012-12-17 (×2): 12.5 mg via ORAL
  Filled 2012-12-16 (×4): qty 1

## 2012-12-16 MED ORDER — FUROSEMIDE 10 MG/ML IJ SOLN: 80.0000 mg | Freq: Two times a day (BID) | INTRAMUSCULAR | Status: DC

## 2012-12-16 MED ORDER — POTASSIUM CHLORIDE CRYS ER 20 MEQ PO TBCR
20.0000 meq | EXTENDED_RELEASE_TABLET | Freq: Every day | ORAL | Status: DC
  Administered 2012-12-16 – 2012-12-17 (×2): 20 meq via ORAL
  Filled 2012-12-16 (×2): qty 1

## 2012-12-16 MED ORDER — MUPIROCIN 2 % EX OINT
TOPICAL_OINTMENT | CUTANEOUS | Status: AC
  Filled 2012-12-16: qty 22

## 2012-12-16 MED ORDER — VANCOMYCIN HCL IN DEXTROSE 1-5 GM/200ML-% IV SOLN
1000.0000 mg | Freq: Two times a day (BID) | INTRAVENOUS | Status: AC
  Administered 2012-12-16: 1000 mg via INTRAVENOUS
  Filled 2012-12-16: qty 200

## 2012-12-16 MED ORDER — SIMVASTATIN 20 MG PO TABS
20.0000 mg | ORAL_TABLET | Freq: Every day | ORAL | Status: DC
  Administered 2012-12-16: 20 mg via ORAL
  Filled 2012-12-16 (×2): qty 1

## 2012-12-16 MED ORDER — FENTANYL CITRATE 0.05 MG/ML IJ SOLN
INTRAMUSCULAR | Status: AC
  Filled 2012-12-16: qty 2

## 2012-12-16 MED ORDER — MIDAZOLAM HCL 5 MG/5ML IJ SOLN
INTRAMUSCULAR | Status: AC
  Filled 2012-12-16: qty 5

## 2012-12-16 MED ORDER — ACETAMINOPHEN 325 MG PO TABS: 325.0000 mg | ORAL_TABLET | ORAL | Status: DC | PRN

## 2012-12-16 MED ORDER — ONDANSETRON HCL 4 MG/2ML IJ SOLN: 4.0000 mg | Freq: Four times a day (QID) | INTRAMUSCULAR | Status: DC | PRN

## 2012-12-16 MED ORDER — SPIRONOLACTONE 25 MG PO TABS
25.0000 mg | ORAL_TABLET | Freq: Every day | ORAL | Status: DC
  Administered 2012-12-16 – 2012-12-17 (×2): 25 mg via ORAL
  Filled 2012-12-16 (×2): qty 1

## 2012-12-16 MED ORDER — LIDOCAINE HCL (PF) 1 % IJ SOLN
INTRAMUSCULAR | Status: AC
  Filled 2012-12-16: qty 60

## 2012-12-16 NOTE — CV Procedure (Signed)
Mason Arnold [id number]  [id number]  Preop Dx: CHF ICM prev ICD  Postop Dx same/ venous stenosis  Procedure:venogram; LV lead placement and ICD assessment  Cx: air aspiration Very tight scar tissue?? infection  Dictation number [id number]  Virl Axe, MD 12/16/2012 10:06 AM

## 2012-12-16 NOTE — H&P (View-Only) (Signed)
Patient Care Team: Hendricks Limes, MD as PCP - General   HPI  Mason Arnold is a 72 y.o. male he is seen in followup for ischemic cardiomyopathy status post coronary bypass graft,  he is status post ICD implantation; underwent generator replacement in October 2010.  His last Myoview was performed in July of 2011. At that time, he had an ejection fraction of 28%. There was a previous infarct involving the anterior wall, apex, and inferior wall with minimal ischemia noted  Echocardiogram in July of 2013 showed an ejection fraction of 20-25%. There was mild left ventricular enlargement, Moderate to severe left atrial enlargement and mildly reduced RV function  He has dyspnea on exertion wth LBBB at 146 msec  He posits repeatedly during our discussion questions like is worth it, why should I try, I am too tired.  He admits to depression, and passive death thoughts but denies active suicidality   Past Medical History  Diagnosis Date  . Diabetes mellitus type II   . Gout   . Myocardial infarction 2000  . Ventricular tachycardia   . Ischemic heart disease   . Congestive heart failure   . Left bundle branch block   . Hyperlipidemia   . Implantable cardiac defibrillator   11/28/2011    Past Surgical History  Procedure Date  . Coronary artery bypass graft   . Urecal cyst resection   . Inguinal herniorrhapy   . Tonsillectomy   . Vasectomy   . A-v cardiac pacemaker insertion     Current Outpatient Prescriptions  Medication Sig Dispense Refill  . acetaminophen (TYLENOL) 500 MG tablet Take 500 mg by mouth every 6 (six) hours as needed.        Marland Kitchen allopurinol (ZYLOPRIM) 100 MG tablet Take 100 mg by mouth daily.        . calcium carbonate (OS-CAL) 600 MG TABS Take 600 mg by mouth daily.        . carvedilol (COREG) 25 MG tablet Half a tablet two times a day       . COLCRYS 0.6 MG tablet Take 0.6 mg by mouth 2 (two) times daily as needed.       . famotidine (PEPCID) 10 MG tablet  Take 10 mg by mouth 2 (two) times daily.        Marland Kitchen FLUoxetine (PROZAC) 10 MG capsule Take 10 mg by mouth daily.        . folic acid (FOLVITE) A999333 MCG tablet Take 400 mcg by mouth daily.        . furosemide (LASIX) 40 MG tablet ONE AND ONE HALF TABLET TWICE DAILY      . insulin glargine (LANTUS) 100 UNIT/ML injection Sliding scale      . loratadine (CLARITIN) 10 MG tablet Take 10 mg by mouth as needed.        . Multiple Vitamin (MULTIVITAMIN) tablet Take 2 tablets by mouth daily.        . niacin (NIASPAN) 750 MG CR tablet Take 750 mg by mouth 2 (two) times daily.        Marland Kitchen omega-3 acid ethyl esters (LOVAZA) 1 G capsule Take 2 g by mouth 2 (two) times daily.        . potassium chloride SA (K-DUR,KLOR-CON) 20 MEQ tablet ONE TABLET DAILY      . ramipril (ALTACE) 2.5 MG capsule Take 1 capsule (2.5 mg total) by mouth daily.  90 capsule  3  . simvastatin (ZOCOR) 20 MG tablet Take  20 mg by mouth daily.        Marland Kitchen spironolactone (ALDACTONE) 25 MG tablet Take 25 mg by mouth daily.        Marland Kitchen warfarin (COUMADIN) 5 MG tablet Take as directed by anticoagulation clinic  90 tablet  2    Allergies  Allergen Reactions  . Ampicillin     RASH  . Crestor (Rosuvastatin Calcium)     Rash as nodules ("like strawberries")  . Propoxyphene-Acetaminophen     hallucinations      Review of Systems negative except from HPI and PMH  Physical Exam BP 107/67  Pulse 60  Resp 18  Ht 5\' 5"  (1.651 m)  Wt 182 lb (82.555 kg)  BMI 30.29 kg/m2  SpO2 93% Well developed and well nourished in no acute distress HENT normal E scleral and icterus clear Neck Supple JVP 8-9; carotids brisk and full Clear to ausculation  egular rate and rhythm, no murmurs gallops or rub Soft with active bowel sounds No clubbing cyanosis none Edema Alert and oriented, grossly normal motor and sensory function Skin Warm and Dry affect sad   Assessment and  Plan

## 2012-12-16 NOTE — Interval H&P Note (Signed)
History and Physical Interval Note:  12/16/2012 7:49 AM  Mason Arnold  has presented today for surgery, with the diagnosis of heart failure  The various methods of treatment have been discussed with the patient and family. After consideration of risks, benefits and other options for treatment, the patient has consented to  Procedure(s) (LRB) with comments: BIV ICD Mason (N/A) as a surgical intervention .  The patient's history has been reviewed, patient examined, no change in status, stable for surgery.  I have reviewed the patient's chart and labs.  Questions were answered to the patient's satisfaction.     Virl Axe

## 2012-12-17 ENCOUNTER — Ambulatory Visit (HOSPITAL_COMMUNITY): Payer: Medicare Other

## 2012-12-17 DIAGNOSIS — Z95810 Presence of automatic (implantable) cardiac defibrillator: Secondary | ICD-10-CM

## 2012-12-17 LAB — PROTIME-INR
INR: 1.1 (ref 0.00–1.49)
Prothrombin Time: 14.1 seconds (ref 11.6–15.2)

## 2012-12-17 LAB — BASIC METABOLIC PANEL
GFR calc non Af Amer: 62 mL/min — ABNORMAL LOW (ref >90)
Glucose, Bld: 118 mg/dL — ABNORMAL HIGH (ref 70–99)
Potassium: 4.2 mEq/L (ref 3.5–5.1)
Sodium: 137 mEq/L (ref 135–145)

## 2012-12-17 LAB — GLUCOSE, CAPILLARY: Glucose-Capillary: 128 mg/dL — ABNORMAL HIGH (ref 70–99)

## 2012-12-17 NOTE — Discharge Summary (Signed)
ELECTROPHYSIOLOGY PROCEDURE DISCHARGE SUMMARY    Patient ID: Mason Arnold,  MRN: [id number], DOB/AGE: 1941-10-01 72 y.o.  Admit date: 12/16/2012 Discharge date: 12/17/2012  Primary Care Physician: Unice Cobble, MD Primary Cardiologist: Kirk Ruths, MD Electrophysiologist: Virl Axe, MD  Primary Discharge Diagnosis:  Ischemic cardiomyopathy and congestive heart failure status post CRT-D upgrade this admission  Secondary Discharge Diagnosis:  1.  Coronary artery disease s/p CABG 2.  Diabetes 3.  Ventricular tachycardia 4.  LBBB 5.  Hyperlipidemia  Procedures This Admission:  1.  Implantation of a new left ventricular lead on 12-16-2012 by Dr Caryl Comes.  The patient's previously implanted STJ Unify ICD was used as well as the patient's previously implanted right atrial and right ventricular lead.  Air was aspirated during the case.  Serial chest x-rays demonstrated no pneumothorax.  2.  Serial chest x-rays after procedure demonstrated no pneumothorax status post LV lead implantation.   Brief HPI: Mason Arnold is a 72 y.o. male with a previously implanted dual chamber ICD.  He was recently evaluated by Dr Caryl Comes the outpatient setting.  He has developed progressive dyspnea on exertion and a LBBB of 132msec.  Risks, benefits, and alternatives to CRTD upgrade were reviewed with the patient who wished to proceed.  Hospital Course:  The patient was admitted and underwent upgrade of his device to a CRTD with details as outlined above.   He was monitored on telemetry overnight which demonstrated AV pacing.  Left chest was without hematoma or ecchymosis.  The device was interrogated and found to be functioning normally.  CXR was obtained and demonstrated no pneumothorax status post device implantation.  Wound care, arm mobility, and restrictions were reviewed with the patient.  Dr Caryl Comes examined the patient and considered them stable for discharge to home.    Discharge Vitals: Blood  pressure 134/82, pulse 66, temperature 99.1 F (37.3 C), temperature source Oral, resp. rate 16, height 5\' 5"  (1.651 m), weight 177 lb 8 oz (80.513 kg), SpO2 91.00%.    Labs:   Lab Results  Component Value Date   WBC 9.8 12/13/2012   HGB 16.2 12/13/2012   HCT 46.8 12/13/2012   MCV 94.7 12/13/2012   PLT 187.0 12/13/2012     Lab 12/17/12 0515  NA 137  K 4.2  CL 96  CO2 29  BUN 16  CREATININE 1.15  CALCIUM 9.1  PROT --  BILITOT --  ALKPHOS --  ALT --  AST --  GLUCOSE 118*     Discharge Medications:    Medication List     As of 12/17/2012 11:25 AM    TAKE these medications         acetaminophen 500 MG tablet   Commonly known as: TYLENOL   Take 500 mg by mouth every 6 (six) hours as needed. For pain      allopurinol 100 MG tablet   Commonly known as: ZYLOPRIM   Take 100 mg by mouth daily.      calcium carbonate 600 MG Tabs   Commonly known as: OS-CAL   Take 600 mg by mouth daily.      carvedilol 25 MG tablet   Commonly known as: COREG   Take 12.5 mg by mouth 2 (two) times daily with a meal.      COLCRYS 0.6 MG tablet   Generic drug: colchicine   Take 0.6 mg by mouth 2 (two) times daily as needed. For gout flares      famotidine 10 MG tablet  Commonly known as: PEPCID   Take 10 mg by mouth daily.      FLUoxetine 20 MG capsule   Commonly known as: PROZAC   Take 1 capsule (20 mg total) by mouth daily.      folic acid A999333 MCG tablet   Commonly known as: FOLVITE   Take 400 mcg by mouth daily.      furosemide 40 MG tablet   Commonly known as: LASIX   Take 40-80 mg by mouth 2 (two) times daily. 80 mg every morning and 40 mg every evening      insulin glargine 100 UNIT/ML injection   Commonly known as: LANTUS   Inject 88-94 Units into the skin at bedtime. Sliding scale      loratadine 10 MG tablet   Commonly known as: CLARITIN   Take 10 mg by mouth daily as needed. For allergies      multivitamin tablet   Take 2 tablets by mouth daily.      niacin 750 MG  CR tablet   Commonly known as: NIASPAN   Take 750 mg by mouth 2 (two) times daily.      omega-3 acid ethyl esters 1 G capsule   Commonly known as: LOVAZA   Take 2 g by mouth 2 (two) times daily.      potassium chloride SA 20 MEQ tablet   Commonly known as: K-DUR,KLOR-CON   Take 20 mEq by mouth daily.      ramipril 2.5 MG capsule   Commonly known as: ALTACE   Take 1 capsule (2.5 mg total) by mouth daily.      simvastatin 20 MG tablet   Commonly known as: ZOCOR   Take 20 mg by mouth daily.      spironolactone 25 MG tablet   Commonly known as: ALDACTONE   Take 25 mg by mouth daily.      warfarin 5 MG tablet   Commonly known as: COUMADIN   Take 2.5-5 mg by mouth daily. 2.5 mg on mon, wed, fri, and 5 mg all other days         Disposition:  Discharge Orders    Future Appointments: Provider: Department: Dept Phone: Center:   12/23/2012 9:45 AM [facility] [phone] None   12/23/2012 10:00 AM [facility] [place]) [phone] [facility]   01/28/2013 2:15 PM Lelon Perla, MD [facility] [place]) [phone] [facility]     Future Orders Please Complete By Expires   Diet - low sodium heart healthy      Increase activity slowly      Discharge instructions      Comments:   Please see post device discharge instructions     Follow-up Information    Follow up with [facility]). On 12/23/2012. (At 10:00 AM for wound check)    Contact information:   [address] [place] [address] [phone]      Follow up with Virl Axe, MD. In 3 months. (For device follow-up; Our office will notify you of your appointment date and time)    Contact information:   [id number] [place] [address] [phone]      Follow up with [facility]. On 12/23/2012. (At 9:45 AM for Coumadin follow-up/INR  check)    Contact information:   [address] [place] [address] [phone]  Follow up with Kirk Ruths, MD. On 01/28/2013. (At 2:15 PM for hospital follow-up s/p CRT)    Contact information:   [address] [place] [address] [phone]         Duration of Discharge Encounter: Greater than 30 minutes including physician time.  Signed, Chanetta Marshall, RN, BSN 12/17/2012, 11:25 AM

## 2012-12-17 NOTE — Progress Notes (Signed)
   ELECTROPHYSIOLOGY ROUNDING NOTE    Patient Name: Mason Arnold Date of Encounter: 12-17-12    [place] feels well.  No chest pain or shortness of breath.  S/p CRTD upgrade 12-16-12.  Pt diuresed >2L last pm per nursing report.  Minimal incisional soreness.   TELEMETRY: Reviewed telemetry pt AV pacing Filed Vitals:   12/16/12 1530 12/16/12 1600 12/16/12 2110 12/17/12 0632  BP: 123/75 114/68 114/75 134/82  Pulse: 60 62 63 66  Temp:   98.8 F (37.1 C) 99.1 F (37.3 C)  TempSrc:   Oral Oral  Resp:   18 16  Height:    5\' 5"  (1.651 m)  Weight:    177 lb 8 oz (80.513 kg)  SpO2:   91% 91%    Intake/Output Summary (Last 24 hours) at 12/17/12 0708 Last data filed at 12/17/12 0600  Gross per 24 hour  Intake      0 ml  Output   2750 ml  Net  -2750 ml    LABS: Basic Metabolic Panel:  Basename 12/17/12 0515  NA 137  K 4.2  CL 96  CO2 29  GLUCOSE 118*  BUN 16  CREATININE 1.15  CALCIUM 9.1  MG --  PHOS --    Radiology/Studies:  Dg Chest Port 1 View 12/16/2012  *RADIOLOGY REPORT*  Clinical Data: Post revision of ICD, question pneumothorax  PORTABLE CHEST - 1 VIEW  Comparison: Portable exam 1617 hours compared to 12/16/2012 at 1055 hours  Findings: Left subclavian transvenous pacemaker / AICD leads project over right atrium, right ventricle and coronary sinus. Enlargement of cardiac silhouette post CABG. Tortuous aorta. Pulmonary vascular congestion. Accentuation of perihilar and basilar interstitial markings similar to previous exam. No definite acute infiltrate, pleural effusion or pneumothorax. No acute osseous findings.  IMPRESSION: No pneumothorax following AICD revision.   Original Report Authenticated By: Lavonia Dana, M.D.     PHYSICAL EXAM Left chest without hematoma or ecchymosis.  Well developed and nourished in no acute distress HENT normal Neck supple with JVP-flat Clear Regular rate and rhythm, no murmurs or gallops Abd-soft with active BS No Clubbing  cyanosis edema Skin-warm and dry A & Oriented  Grossly normal sensory and motor function   DEVICE INTERROGATION: Device interrogation pending.   Wound care, arm mobility, restrictions reviewed with patient.  Routine follow up scheduled.  Discharge on ALL home meds F/u BC 6 qweeks >>cancel 3 month visit with him and make it with me Will need BMET at wouund check and [facility] visit

## 2012-12-17 NOTE — Op Note (Signed)
NAMEOGHENERUNO, SACKSTEDER NO.:  [id number]  MEDICAL RECORD NO.:  [id number]  LOCATION:  [id number]                        FACILITY:  [facility]  PHYSICIAN:  Deboraha Sprang, MD, FACCDATE OF BIRTH:  1940/12/25  DATE OF PROCEDURE:  12/16/2012 DATE OF DISCHARGE:                              OPERATIVE REPORT   PREOPERATIVE DIAGNOSES:  Congestive heart failure, left bundle-branch block, ischemic cardiomyopathy, previously implanted ICD.  POSTOPERATIVE DIAGNOSES:  Congestive heart failure, left bundle-branch block, ischemic cardiomyopathy, previously implanted ICD.  PROCEDURE:  Venography, left ventricular lead placement, and defibrillator assessment.  Following obtaining informed consent, the patient was brought to the electrophysiology laboratory and placed on the fluoroscopic table in supine position.  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  After routine prep and drape, lidocaine was infiltrated just below the line of the previous incision.  It was somewhat different angle and was carried down to layer of the prepectoral fascia with electrocautery and sharp dissection.  A small pocket was opened and that allowed [place] to gain access, but the defibrillator pocket was not exposed.  We then with modest amount of difficulty were able to gain access to the extrathoracic left subclavian vein.  On 2 occasions, there was air in the syringe.  Postoperative chest x-ray is pending.  Fluoroscopy throughout the procedure and hemodynamic monitoring failed to demonstrate any evidence of a significant pneumothorax.  With difficulty having to gain access to the extrathoracic left subclavian vein, we then had to use a variety of tools to gain access to the superior vena cava. We ended up using an Amplatz superstiff wire that allowed upsizing of more stable sheaths  through which we then passed a Medtronic MB 2 coronary sinus cannulation catheter and with mild difficulty ended up cannulating the coronary sinus and then with the Wooley wire identifying a posterolateral vein, which was just beyond a vein in the coronary sinus body.  We buddy wired with a whisper wire and then passed a St. Jude 1258 lead, serial number [id number] to the junction between the mid 3rd and the distal 3rd.  In this location, there was no diaphragmatic pacing at 10 V.  The threshold was 1.4 at 0.5.  RL wave was 17, pace impedance was 1012.  This lead was secured to the prepectoral fascia following removal into the CS deployment system and we then turned our attention to opening up the previously implanted device.  It was incredibly difficult to do with very tight scar tissue.  Because this potentially represent an indolent infection, this pocket was cultured. We freed up the device.  We then pulled out a plug from the previously plug LV port and inserted the new device into the previously implanted device, a St. Jude Unify 3231 device, serial number [id number].  Through the device, the bipolar P-wave was nonexistent.  The impedance was 510 and threshold 0.75 at 0.4.  The R-wave was greater than 12, impedance 610, threshold 1.25 at 0.8 and the LV impedance was 1000 with a threshold of 1.25 at 0.5.  The interval of the LV electrogram and the estimated  RV electrogram was 65 milliseconds.  The RV paced interval was 190 milliseconds.  The pocket was copiously irrigated with antibiotic-containing saline solution.  The leads and pulse generator were placed in the pocket and secured to the prepectoral fascia.  The wound was then closed in 3 layers in normal fashion.  The wound was washed, dried, and a benzoin and Steri-Strip dressing was applied. Needle counts, sponge counts, and instrument counts were correct at the end of the procedure according to the staff.  The patient tolerated  the procedure without undue distress.  The patient's chest x-ray is pending to exclude pneumothorax as noted above.  Cultures are also pending.     Deboraha Sprang, MD, [facility]     SCK/MEDQ  D:  12/16/2012  T:  12/17/2012  Job:  [id number]

## 2012-12-18 LAB — WOUND CULTURE: Culture: NO GROWTH

## 2012-12-23 ENCOUNTER — Ambulatory Visit (INDEPENDENT_AMBULATORY_CARE_PROVIDER_SITE_OTHER): Payer: Medicare Other

## 2012-12-23 ENCOUNTER — Ambulatory Visit (INDEPENDENT_AMBULATORY_CARE_PROVIDER_SITE_OTHER): Payer: Medicare Other | Admitting: *Deleted

## 2012-12-23 ENCOUNTER — Other Ambulatory Visit (INDEPENDENT_AMBULATORY_CARE_PROVIDER_SITE_OTHER): Payer: Medicare Other

## 2012-12-23 ENCOUNTER — Encounter: Payer: Self-pay | Admitting: Internal Medicine

## 2012-12-23 DIAGNOSIS — I5023 Acute on chronic systolic (congestive) heart failure: Secondary | ICD-10-CM

## 2012-12-23 DIAGNOSIS — Z7901 Long term (current) use of anticoagulants: Secondary | ICD-10-CM

## 2012-12-23 DIAGNOSIS — I4891 Unspecified atrial fibrillation: Secondary | ICD-10-CM

## 2012-12-23 DIAGNOSIS — I255 Ischemic cardiomyopathy: Secondary | ICD-10-CM

## 2012-12-23 DIAGNOSIS — I2589 Other forms of chronic ischemic heart disease: Secondary | ICD-10-CM

## 2012-12-23 LAB — ICD DEVICE OBSERVATION
AL AMPLITUDE: 3.1 mv
AL THRESHOLD: 0.75 V
ATRIAL PACING ICD: 99.35 pct
BAMS-0001: 160 {beats}/min
DEVICE MODEL ICD: 733092
FVT: 0
LV LEAD IMPEDENCE ICD: 487.5 Ohm
LV LEAD THRESHOLD: 1.25 V
MODE SWITCH EPISODES: 0
PACEART VT: 0
RV LEAD AMPLITUDE: 12 mv
TZAT-0001FASTVT: 1
TZAT-0004FASTVT: 8
TZAT-0013SLOWVT: 3
TZAT-0018SLOWVT: NEGATIVE
TZAT-0019FASTVT: 7.5 V
TZAT-0019SLOWVT: 7.5 V
TZAT-0020SLOWVT: 1 ms
TZON-0003SLOWVT: 350 ms
TZON-0005SLOWVT: 6
TZON-0010FASTVT: 80 ms
TZON-0010SLOWVT: 80 ms
TZST-0001FASTVT: 2
TZST-0001FASTVT: 3
TZST-0001FASTVT: 4
TZST-0001SLOWVT: 2
TZST-0001SLOWVT: 4
TZST-0001SLOWVT: 5
TZST-0003FASTVT: 30 J
TZST-0003FASTVT: 40 J
TZST-0003FASTVT: 40 J
TZST-0003SLOWVT: 30 J
TZST-0003SLOWVT: 40 J
VENTRICULAR PACING ICD: 99.75 pct

## 2012-12-23 LAB — BASIC METABOLIC PANEL
CO2: 30 mEq/L (ref 19–32)
Calcium: 8.9 mg/dL (ref 8.4–10.5)
Glucose, Bld: 114 mg/dL — ABNORMAL HIGH (ref 70–99)
Potassium: 3.8 mEq/L (ref 3.5–5.1)
Sodium: 132 mEq/L — ABNORMAL LOW (ref 135–145)

## 2012-12-23 NOTE — Patient Instructions (Addendum)
Return office visit 03/22/13 @ 9:00am with Dr. Caryl Comes.

## 2012-12-23 NOTE — Addendum Note (Signed)
Addended by: Janne Napoleon on: 12/23/2012 10:57 AM   Modules accepted: Orders

## 2012-12-23 NOTE — Progress Notes (Signed)
Wound check-ICD 

## 2013-01-06 ENCOUNTER — Ambulatory Visit (INDEPENDENT_AMBULATORY_CARE_PROVIDER_SITE_OTHER): Payer: Medicare Other | Admitting: Pharmacist

## 2013-01-06 DIAGNOSIS — I4891 Unspecified atrial fibrillation: Secondary | ICD-10-CM

## 2013-01-06 DIAGNOSIS — Z7901 Long term (current) use of anticoagulants: Secondary | ICD-10-CM

## 2013-01-28 ENCOUNTER — Ambulatory Visit (INDEPENDENT_AMBULATORY_CARE_PROVIDER_SITE_OTHER): Payer: Medicare Other

## 2013-01-28 ENCOUNTER — Ambulatory Visit (INDEPENDENT_AMBULATORY_CARE_PROVIDER_SITE_OTHER): Payer: Medicare Other | Admitting: Cardiology

## 2013-01-28 ENCOUNTER — Encounter: Payer: Self-pay | Admitting: Cardiology

## 2013-01-28 VITALS — BP 120/80 | HR 68 | Ht 65.0 in | Wt 182.0 lb

## 2013-01-28 MED ORDER — PRAVASTATIN SODIUM 40 MG PO TABS: 40.0000 mg | ORAL_TABLET | Freq: Every evening | ORAL | Status: DC

## 2013-01-28 MED ORDER — COLCHICINE 0.6 MG PO TABS: 0.6000 mg | ORAL_TABLET | Freq: Every day | ORAL | Status: DC

## 2013-01-28 NOTE — Assessment & Plan Note (Signed)
Managed by electrophysiology.

## 2013-01-28 NOTE — Assessment & Plan Note (Signed)
Patient discontinued his Zocor because he thought it may be contributing to myalgias. He does not think Lipitor helped in the past. He is willing to try Pravachol 40 mg daily. Check lipids and liver in 6 weeks.

## 2013-01-28 NOTE — Patient Instructions (Addendum)
Your physician wants you to follow-up in: [place] will receive a reminder letter in the mail two months in advance. If you don't receive a letter, please call our office to schedule the follow-up appointment.   START PRAVASTATIN 40 MG ONCE DAILY  Your physician recommends that you return for lab work in: 6 WEEKS=FASTING=1ST WEEK APRIL

## 2013-01-28 NOTE — Assessment & Plan Note (Signed)
Not on aspirin given need for Coumadin. Resume statin. Plan Myoview in 6 months when he returns.

## 2013-01-28 NOTE — Progress Notes (Signed)
HPI: [name] is a pleasant gentleman who has a history of coronary artery disease status post coronary bypass graft, as well as an ischemic cardiomyopathy and hyperlipidemia. His last Myoview was performed in July of 2011. At that time, he had an ejection fraction of 28%. There was a previous infarct involving the anterior wall, apex, and inferior wall with minimal ischemia noted. Echocardiogram in July of 2013 showed an ejection fraction of 20-25%. There was mild left ventricular enlargement, Moderate to severe left atrial enlargement and mildly reduced RV function. Patient had CRT upgrade in January 2014. Since then, the patient denies any dyspnea on exertion, orthopnea, PND, pedal edema, palpitations, syncope or chest pain. He states his dyspnea is much improved following CRT.    Current Outpatient Prescriptions  Medication Sig Dispense Refill  . acetaminophen (TYLENOL) 500 MG tablet Take 500 mg by mouth every 6 (six) hours as needed. For pain      . allopurinol (ZYLOPRIM) 100 MG tablet Take 100 mg by mouth daily.        . calcium carbonate (OS-CAL) 600 MG TABS Take 600 mg by mouth daily.        . carvedilol (COREG) 25 MG tablet Take 12.5 mg by mouth 2 (two) times daily with a meal.       . COLCRYS 0.6 MG tablet Take 0.6 mg by mouth 2 (two) times daily as needed. For gout flares      . famotidine (PEPCID) 10 MG tablet Take 10 mg by mouth daily.       Marland Kitchen FLUoxetine (PROZAC) 20 MG capsule Take 1 capsule (20 mg total) by mouth daily.  90 capsule  3  . folic acid (FOLVITE) A999333 MCG tablet Take 400 mcg by mouth daily.        . furosemide (LASIX) 40 MG tablet 80 mg every morning and 40 mg every evening      . insulin glargine (LANTUS) 100 UNIT/ML injection Inject 70 Units into the skin at bedtime. Sliding scale      . loratadine (CLARITIN) 10 MG tablet Take 10 mg by mouth daily as needed. For allergies      . Multiple Vitamin (MULTIVITAMIN) tablet Take 2 tablets by mouth daily.        Marland Kitchen  omega-3 acid ethyl esters (LOVAZA) 1 G capsule Take 2 g by mouth 2 (two) times daily.        . potassium chloride SA (K-DUR,KLOR-CON) 20 MEQ tablet Take 20 mEq by mouth daily.       . ramipril (ALTACE) 2.5 MG capsule Take 1 capsule (2.5 mg total) by mouth daily.  90 capsule  3  . spironolactone (ALDACTONE) 25 MG tablet Take 25 mg by mouth daily.        Marland Kitchen warfarin (COUMADIN) 5 MG tablet Take 2.5-5 mg by mouth daily. 2.5 mg on mon, wed, fri, and 5 mg all other days       No current facility-administered medications for this visit.     Past Medical History  Diagnosis Date  . Gout   . Ventricular tachycardia   . Ischemic heart disease   . Congestive heart failure   . Left bundle branch block   . Hyperlipidemia   . Pacemaker   . ICD (implantable cardiac defibrillator) in place   . Hypertension   . Heart murmur   . Anginal pain   . Myocardial infarction 2000; 2002; 2004; ~ 2006  . Pneumonia     "once, I  think" (12/16/2012)  . Diabetes mellitus type II   . Arthritis     "feet, arms, hands" (12/16/2012)    Past Surgical History  Procedure Laterality Date  . Coronary artery bypass graft  2000    CABG X4  . Urachal cyst excision  ~ 1995  . Vasectomy  ?1967  . A-v cardiac pacemaker insertion  2002; 2010  . Insert / replace / remove pacemaker  12/16/2012    LV lead placement; ICD assessment   . Tonsillectomy and adenoidectomy  1954  . Cardiac catheterization      "3 or 4; never had interventions" (12/16/2012)    History   Social History  . Marital Status: Married    Spouse Name: N/A    Number of Children: N/A  . Years of Education: N/A   Occupational History  . Not on file.   Social History Main Topics  . Smoking status: Never Smoker   . Smokeless tobacco: Former Systems developer    Types: Snuff, Chew     Comment: 12/16/2012 "stopped chew/snuff in ~ 1992 after 15 yr"  . Alcohol Use: 4.2 oz/week    7 Shots of liquor per week  . Drug Use: No  . Sexually Active: Yes   Other Topics  Concern  . Not on file   Social History Narrative  . No narrative on file    ROS: no fevers or chills, productive cough, hemoptysis, dysphasia, odynophagia, melena, hematochezia, dysuria, hematuria, rash, seizure activity, orthopnea, PND, pedal edema, claudication. Remaining systems are negative.  Physical Exam: Well-developed well-nourished in no acute distress.  Skin is warm and dry.  HEENT is normal.  Neck is supple.  Chest is clear to auscultation with normal expansion.  Cardiovascular exam is regular rate and rhythm.  Abdominal exam nontender or distended. No masses palpated. Extremities show no edema. neuro grossly intact

## 2013-01-28 NOTE — Assessment & Plan Note (Signed)
Continue Coumadin and beta blocker. 

## 2013-01-28 NOTE — Assessment & Plan Note (Signed)
Continue ACE inhibitor and beta blocker. 

## 2013-01-28 NOTE — Assessment & Plan Note (Signed)
Blood pressure controlled. Continue present medications. 

## 2013-01-28 NOTE — Assessment & Plan Note (Signed)
euvolemic on examination and much improved following CRT. Check potassium and renal function in 6 weeks. Continue present dose of diuretics.

## 2013-02-18 ENCOUNTER — Ambulatory Visit (INDEPENDENT_AMBULATORY_CARE_PROVIDER_SITE_OTHER): Payer: Medicare Other | Admitting: *Deleted

## 2013-02-18 DIAGNOSIS — Z7901 Long term (current) use of anticoagulants: Secondary | ICD-10-CM

## 2013-02-18 DIAGNOSIS — I4891 Unspecified atrial fibrillation: Secondary | ICD-10-CM

## 2013-02-18 LAB — POCT INR: INR: 1.9

## 2013-03-04 ENCOUNTER — Ambulatory Visit (INDEPENDENT_AMBULATORY_CARE_PROVIDER_SITE_OTHER): Payer: Medicare Other | Admitting: *Deleted

## 2013-03-04 DIAGNOSIS — Z7901 Long term (current) use of anticoagulants: Secondary | ICD-10-CM

## 2013-03-04 DIAGNOSIS — I4891 Unspecified atrial fibrillation: Secondary | ICD-10-CM

## 2013-03-04 LAB — POCT INR: INR: 2

## 2013-03-16 ENCOUNTER — Other Ambulatory Visit: Payer: Self-pay | Admitting: *Deleted

## 2013-03-16 ENCOUNTER — Other Ambulatory Visit (INDEPENDENT_AMBULATORY_CARE_PROVIDER_SITE_OTHER): Payer: Medicare Other

## 2013-03-16 ENCOUNTER — Other Ambulatory Visit: Payer: Medicare Other

## 2013-03-16 DIAGNOSIS — E119 Type 2 diabetes mellitus without complications: Secondary | ICD-10-CM

## 2013-03-16 DIAGNOSIS — E785 Hyperlipidemia, unspecified: Secondary | ICD-10-CM

## 2013-03-16 DIAGNOSIS — I10 Essential (primary) hypertension: Secondary | ICD-10-CM

## 2013-03-16 LAB — LIPID PANEL
Cholesterol: 222 mg/dL — ABNORMAL HIGH (ref 0–200)
Total CHOL/HDL Ratio: 6
Triglycerides: 332 mg/dL — ABNORMAL HIGH (ref 0.0–149.0)

## 2013-03-16 LAB — HEPATIC FUNCTION PANEL
ALT: 34 U/L (ref 0–53)
AST: 26 U/L (ref 0–37)
Albumin: 4.1 g/dL (ref 3.5–5.2)
Alkaline Phosphatase: 56 U/L (ref 39–117)

## 2013-03-16 LAB — BASIC METABOLIC PANEL
CO2: 30 mEq/L (ref 19–32)
Calcium: 9.1 mg/dL (ref 8.4–10.5)
Creatinine, Ser: 1.3 mg/dL (ref 0.4–1.5)
GFR: 56.98 mL/min — ABNORMAL LOW (ref >60.00)
Sodium: 138 mEq/L (ref 135–145)

## 2013-03-17 ENCOUNTER — Other Ambulatory Visit: Payer: Self-pay | Admitting: *Deleted

## 2013-03-17 DIAGNOSIS — E7800 Pure hypercholesterolemia, unspecified: Secondary | ICD-10-CM

## 2013-03-17 MED ORDER — PRAVASTATIN SODIUM 80 MG PO TABS: 80.0000 mg | ORAL_TABLET | Freq: Every evening | ORAL | Status: DC

## 2013-03-22 ENCOUNTER — Other Ambulatory Visit: Payer: Self-pay

## 2013-03-22 ENCOUNTER — Ambulatory Visit (INDEPENDENT_AMBULATORY_CARE_PROVIDER_SITE_OTHER): Payer: Medicare Other | Admitting: *Deleted

## 2013-03-22 ENCOUNTER — Telehealth: Payer: Self-pay | Admitting: Cardiology

## 2013-03-22 DIAGNOSIS — Z7901 Long term (current) use of anticoagulants: Secondary | ICD-10-CM

## 2013-03-22 DIAGNOSIS — I4891 Unspecified atrial fibrillation: Secondary | ICD-10-CM

## 2013-03-22 DIAGNOSIS — I5023 Acute on chronic systolic (congestive) heart failure: Secondary | ICD-10-CM

## 2013-03-22 DIAGNOSIS — I255 Ischemic cardiomyopathy: Secondary | ICD-10-CM

## 2013-03-22 DIAGNOSIS — Z95810 Presence of automatic (implantable) cardiac defibrillator: Secondary | ICD-10-CM

## 2013-03-22 DIAGNOSIS — I2589 Other forms of chronic ischemic heart disease: Secondary | ICD-10-CM

## 2013-03-22 LAB — ICD DEVICE OBSERVATION
BAMS-0003: 70 {beats}/min
DEVICE MODEL ICD: 733092
FVT: 0
LV LEAD IMPEDENCE ICD: 562.5 Ohm
MODE SWITCH EPISODES: 0
PACEART VT: 0
TOT-0006: 20101013000000
TOT-0007: 1
TOT-0008: 0
TOT-0009: 2
TOT-0010: 12
TZAT-0001SLOWVT: 1
TZAT-0004SLOWVT: 8
TZAT-0012FASTVT: 200 ms
TZAT-0012SLOWVT: 200 ms
TZAT-0013FASTVT: 1
TZAT-0019FASTVT: 7.5 V
TZAT-0020FASTVT: 1 ms
TZON-0004FASTVT: 24
TZON-0004SLOWVT: 50
TZON-0005FASTVT: 6
TZST-0001FASTVT: 5
TZST-0001SLOWVT: 3
TZST-0003FASTVT: 40 J
TZST-0003FASTVT: 40 J
TZST-0003SLOWVT: 20 J
TZST-0003SLOWVT: 40 J
VF: 0

## 2013-03-22 NOTE — Progress Notes (Signed)
Pacemaker check in clinic by industry. Pt had syncopal episode and wanted device checked. No VT/VF episodes recorded. Episodes of PMT---changed PVARP from 300 to 325 ms. No other changes made. followup as planned.

## 2013-03-22 NOTE — Telephone Encounter (Signed)
New problem   Pt's wife want you to check with PT lab so they can give you information on pt's fall and pt refuse to go to ER. Please call pt

## 2013-03-22 NOTE — Telephone Encounter (Signed)
Spoke with pt, he is aware that he needs to have head scan today to make sure he is not bleeding. The pt voiced understanding that he needs to be seen today. He denies headaches, vision problems or dizziness.

## 2013-03-23 ENCOUNTER — Encounter: Payer: Self-pay | Admitting: Internal Medicine

## 2013-03-23 ENCOUNTER — Ambulatory Visit (INDEPENDENT_AMBULATORY_CARE_PROVIDER_SITE_OTHER): Payer: Medicare Other | Admitting: Internal Medicine

## 2013-03-23 VITALS — BP 122/78 | HR 87 | Temp 98.1°F | Wt 185.0 lb

## 2013-03-23 DIAGNOSIS — R55 Syncope and collapse: Secondary | ICD-10-CM

## 2013-03-23 DIAGNOSIS — E162 Hypoglycemia, unspecified: Secondary | ICD-10-CM

## 2013-03-23 NOTE — Progress Notes (Signed)
Subjective:    Patient ID: Mason Arnold, male    DOB: 04-17-1941, 72 y.o.   MRN: [id number]  HPI  After standing up and walking 12-15 steps & turningaround he had loss of consciousness on 08/19/13 @ 1:20 pm, approximately 1 hour post lunch. He denies lightheadedness upon standing prior to the event. The LOC lasted possibly less than a minute according to his wife's history. He did sustain an injury to his right forehead with significant bleeding. She did not observe any abnormal motor activity. No associated cardiac prodrome of tachycardia, rhythm irregularity , palpitations, chest pain or dyspnea No neurologic prodrome of headache, numbness and tingling, extremity weakness, mental status change, vertigo, change in vision. No associated tremor , abnormal  limb movement, seizure activity, urine/stool incontinence   His defibrillator was checked yesterday; there were no dysrhythmias noted. "My pacer is running a 100 %".  He had a similar episode one-2 years ago. Also in the past he's had some loss of vision without loss of consciousness. He attributed this to higher doses of medication (Cardizem & Aldactone) he was taking.       Review of Systems   His fasting blood sugars range from 99-120. He is on Lantus 90 units daily from the [place]. His last A1c was 6.3% 8-9 mos ago.  He had a hypoglycemic episode  4-6 at 7 AM when his glucose was 54.He treated this with a snack      Objective:   Physical Exam Gen.: Healthy and well-nourished in appearance. Alert, appropriate and cooperative throughout exam.Appears younger than stated age  Eyes: No corneal or conjunctival inflammation noted. Pupils equal round reactive to light and accommodation.  Extraocular motion intact. No nystagmus. FOV normal. Vision grossly normal with lenses Ears: External  ear exam reveals no significant lesions or deformities. Canals clear .TMs normal. Hearing is grossly normal bilaterally. Nose: External nasal exam reveals  no deformity or inflammation. Nasal mucosa are pink and moist. No lesions or exudates noted.  Mouth: Oral mucosa and oropharynx reveal no lesions or exudates. Teeth in good repair. Neck: No deformities, masses, or tenderness noted. Range of motion excellent Lungs: Normal respiratory effort; chest expands symmetrically. Lungs are clear to auscultation without rales, wheezes, or increased work of breathing. Heart: Normal rate and rhythm. Normal S1 and S2. No gallop, click, or rub. Grade 1.5- 2 /6  Raspy systolic murmur                             Musculoskeletal/extremities: No clubbing, cyanosis, edema, or significant extremity  deformity noted. Range of motion normal .Tone & strength  Normal. Joints  reveal mild  DJD DIP changes. Nail health good.  Vascular: Carotid, radial artery, dorsalis pedis and  posterior tibial pulses are full and equal. No bruits present. Neurologic: Alert and oriented x3. Deep tendon reflexes symmetrical and normal.  Gait normal. Rhomberg & finger to nose normal Skin: Intact without suspicious lesions or rashes. Ecchymoses left forearm and right knee area. Healing eschar right forehead Lymph: No cervical, axillary lymphadenopathy present. Psych: Mood and affect are normal. Normally interactive  Assessment & Plan:  #1 syncope or drop attacks with no evidence of dysrhythmia  #2 diabetes; he is on high-dose Lantus with intermittent hypoglycemia. His last A1c was in the prediabetic range. The most important goal would be to avoid hypoglycemia at all costs. At his age with comorbidities; A1c goal Typically is 8%. Less than 7 should be achieved only if he has no associated hypoglycemia

## 2013-03-23 NOTE — Patient Instructions (Addendum)
Review and correct the record as indicated. Please share record with all medical staff seen.  If you activate the  My Chart system; lab & Xray results will be released directly  to you as soon as I review & address these through the computer. If you choose not to sign up for My Chart within 36 hours of labs being drawn; results will be reviewed & interpretation added before being copied & mailed, causing a delay in getting the results to you.If you do not receive that report within 7-10 days ,please call. Additionally you can use this system to gain direct  access to your records  if  out of town or @ an office of a  physician who is not in  the My Chart network.  This improves continuity of care & places you in control of your medical record.

## 2013-03-28 ENCOUNTER — Encounter: Payer: Self-pay | Admitting: Neurology

## 2013-04-07 ENCOUNTER — Encounter: Payer: Self-pay | Admitting: Internal Medicine

## 2013-04-11 ENCOUNTER — Encounter: Payer: Self-pay | Admitting: Neurology

## 2013-04-11 ENCOUNTER — Other Ambulatory Visit (HOSPITAL_COMMUNITY): Payer: Self-pay | Admitting: Neurology

## 2013-04-11 ENCOUNTER — Ambulatory Visit (INDEPENDENT_AMBULATORY_CARE_PROVIDER_SITE_OTHER): Payer: Medicare Other | Admitting: Neurology

## 2013-04-11 ENCOUNTER — Other Ambulatory Visit: Payer: Self-pay | Admitting: Neurology

## 2013-04-11 VITALS — BP 118/72 | HR 62 | Temp 98.4°F | Resp 12 | Ht 65.0 in | Wt 188.0 lb

## 2013-04-11 DIAGNOSIS — R55 Syncope and collapse: Secondary | ICD-10-CM

## 2013-04-11 NOTE — Progress Notes (Signed)
Mason Arnold is a very pleasant 72 year old male with a history of rheumatic fever and he also had a life-threatening heart attack about 14 years ago.  He had a quadruple bypass before he ran the time of her lung machine and they could do a differential vessel.  Following that, he had an episode of CHF and he also developed atrial fibrillation and he also had ventricular tachycardia.  He had an implantable pacemaker for the ventricular tachycardia which went [place] surely [place] due to the atrial fibrillation.  He has since had his defibrillator levels adjusted and he also has a pacemaker.  In years past, he had had an MRA scan and CAT scan of the head.  He is now not a good candidate for an MRA due to the pacemaker.  In terms of syncope, he had 2 episodes of syncope about 2-3 years ago.  Following this, he had adjustment of his spironolactone medication to be decreased in half and there may have been another medication that was decreased.  One year ago, his spironolactone was increased due to low potassium on the Lasix and spironolactone combination.  After that, he did notice increase in postural dizziness.  About once a day he will feel dizzy when standing up and when he sits down or lies down it is improved.  Some days the medicine seems to affect more than others, and on those days he doesn't drive and he just rests.  23 days ago, he had lunch and coffee and was getting ready to go to the bedroom for a nap.  He passed out and cut above his left eye on his glasses.  He may have been out for approximately 2 minutes.  When he got up he saw the blood and he had difficulty getting up by himself. He uses cell phone to call his wife.  He did not need stitches in he did not go to the emergency room.  He has had no further episodes since that time.  There was no jerking behavior reported or postictal confusion.    Review of systems reveals some dyspnea on exertion but no headaches, vertigo, numbness or  paresthesias, focal weakness or other neurological signs.  General review of systems is unremarkable outside of his known medical problems.  Past Medical History  Diagnosis Date  . Gout   . Ventricular tachycardia   . Ischemic heart disease   . Congestive heart failure   . Left bundle branch block   . Hyperlipidemia   . Pacemaker   . ICD (implantable cardiac defibrillator) in place   . Hypertension   . Heart murmur   . Anginal pain   . Myocardial infarction 2000; 2002; 2004; ~ 2006  . Pneumonia     "once, I think" (12/16/2012)  . Diabetes mellitus type II   . Arthritis     "feet, arms, hands" (12/16/2012)  . Gout     Current Outpatient Prescriptions on File Prior to Visit  Medication Sig Dispense Refill  . acetaminophen (TYLENOL) 500 MG tablet Take 500 mg by mouth every 6 (six) hours as needed. For pain      . allopurinol (ZYLOPRIM) 100 MG tablet Take 100 mg by mouth daily.        . calcium carbonate (OS-CAL) 600 MG TABS Take 600 mg by mouth daily.        . carvedilol (COREG) 25 MG tablet Take 12.5 mg by mouth 2 (two) times daily with a meal.       .  COLCRYS 0.6 MG tablet Take 0.6 mg by mouth 2 (two) times daily as needed. For gout flares      . famotidine (PEPCID) 10 MG tablet Take 10 mg by mouth daily.       . flunisolide (NASALIDE) 25 MCG/ACT (0.025%) SOLN Inhale 2 sprays into the lungs 2 (two) times daily.      Marland Kitchen FLUoxetine (PROZAC) 40 MG capsule Take 40 mg by mouth daily.      . folic acid (FOLVITE) A999333 MCG tablet Take 400 mcg by mouth daily.        . furosemide (LASIX) 40 MG tablet 80 mg every morning and 40 mg every evening      . insulin glargine (LANTUS) 100 UNIT/ML injection Inject 70 Units into the skin at bedtime. Sliding scale, 70-90      . loratadine (CLARITIN) 10 MG tablet Take 10 mg by mouth daily as needed. For allergies      . Multiple Vitamin (MULTIVITAMIN) tablet Take 1 tablet by mouth daily.       Marland Kitchen omega-3 acid ethyl esters (LOVAZA) 1 G capsule Take 2 g by  mouth 2 (two) times daily.        . potassium chloride SA (K-DUR,KLOR-CON) 20 MEQ tablet Take 20 mEq by mouth daily.       . pravastatin (PRAVACHOL) 80 MG tablet Take 1 tablet (80 mg total) by mouth every evening.  90 tablet  3  . ramipril (ALTACE) 2.5 MG capsule Take 1 capsule (2.5 mg total) by mouth daily.  90 capsule  3  . spironolactone (ALDACTONE) 25 MG tablet Take 25 mg by mouth daily.        Marland Kitchen warfarin (COUMADIN) 5 MG tablet Take 2.5-5 mg by mouth daily. 2.5 mg on mon, wed, fri, and 5 mg all other days       No current facility-administered medications on file prior to visit.   Ampicillin; Crestor; Orange fruit; and Propoxyphene-acetaminophen  History   Social History  . Marital Status: Married    Spouse Name: N/A    Number of Children: N/A  . Years of Education: N/A   Occupational History  . Not on file.   Social History Main Topics  . Smoking status: Never Smoker   . Smokeless tobacco: Former Systems developer    Types: Snuff, Chew     Comment: 12/16/2012 "stopped chew/snuff in ~ 1992 after 15 yr"  . Alcohol Use: 4.2 oz/week    7 Shots of liquor per week     Comment: one shot a day  . Drug Use: No     Comment: none  . Sexually Active: Yes   Other Topics Concern  . Not on file   Social History Narrative  . No narrative on file    Family History  Problem Relation Age of Onset  . Heart attack Maternal Grandfather 78  . Heart attack Maternal Grandmother 82  . Heart attack Paternal Grandfather 8  . Heart attack Father 20  . Heart failure Father 54  . Aneurysm Brother     BP 118/72  Pulse 62  Temp(Src) 98.4 F (36.9 C)  Resp 12  Ht 5\' 5"  (1.651 m)  Wt 188 lb (85.276 kg)  BMI 31.28 kg/m2   Alert and oriented x 3.  Memory function appears to be intact.  Concentration and attention are normal for educational level and background.  Speech is fluent and without significant word finding difficulty.  Is aware of current events.  No  carotid bruits detected.  Cranial nerve II  through XII are within normal limits.  This includes normal optic discs and acuity, EOMI, PERLA, facial movement and sensation intact, hearing grossly intact, gag intact,Uvula raises symmetrically and tongue protrudes evenly. Motor strength is 5 over 5 throughout all limbs.  No atrophy, abnormal tone or tremors. Reflexes are 2+ and symmetric in the upper  Extremities and 1+ in the lowers Sensory exam is intact. Coordination is intact for fine movements and rapid alternating movements in all limbs Gait and station are normal.   Impression: 1. Syncope episode in this 72 year old male with a past history of severe MI, CABG, ventricular tachycardia, atrial fibrillation and postural dizziness influenced by his medication regimen.  Plan: 1. We will do a carotid ultrasound to rule out vertebral artery stenosis or subclavian steal syndrome. 2. If the carotid onset is unremarkable, I would teur I attention to the possibility that his medication regimen is  producing postural dizziness and contributing to the syncope event.  If the caudal shadows unremarkable, neurology followup would be p.r.n..  Thank you for your referral of this very pleasant patient

## 2013-04-12 ENCOUNTER — Telehealth: Payer: Self-pay | Admitting: Neurology

## 2013-04-12 NOTE — Telephone Encounter (Signed)
Unable to get through to the patient at the home number. Left a detailed message on the mobile number with the appt for the carotid US at [facility] on Tuesday, May 13th at 0945; entrance [address] off of [address]. Asked him to call me back to confirm receipt of this message.

## 2013-04-13 NOTE — Telephone Encounter (Signed)
Received a fax from [facility] this morning stating that the patient was aware of his appointment on May 13th at the hospital and wanted the nurse to know he got the message.

## 2013-04-19 ENCOUNTER — Ambulatory Visit (INDEPENDENT_AMBULATORY_CARE_PROVIDER_SITE_OTHER): Payer: Medicare Other | Admitting: Pharmacist

## 2013-04-19 ENCOUNTER — Ambulatory Visit (HOSPITAL_COMMUNITY)
Admission: RE | Admit: 2013-04-19 | Discharge: 2013-04-19 | Disposition: A | Discharge status: Home / Self Care | Payer: Medicare Other | Source: Ambulatory Visit | Attending: Neurology | Admitting: Neurology

## 2013-04-19 DIAGNOSIS — R55 Syncope and collapse: Secondary | ICD-10-CM

## 2013-04-19 DIAGNOSIS — I4891 Unspecified atrial fibrillation: Secondary | ICD-10-CM

## 2013-04-19 DIAGNOSIS — Z7901 Long term (current) use of anticoagulants: Secondary | ICD-10-CM

## 2013-04-19 MED ORDER — WARFARIN SODIUM 5 MG PO TABS: 2.5000 mg | ORAL_TABLET | Freq: Every day | ORAL | Status: DC

## 2013-04-19 NOTE — Progress Notes (Signed)
Bilateral carotid artery duplex:  No evidence of hemodynamically significant internal carotid artery stenosis.   Vertebral artery flow is antegrade.

## 2013-05-17 ENCOUNTER — Ambulatory Visit (INDEPENDENT_AMBULATORY_CARE_PROVIDER_SITE_OTHER): Payer: Medicare Other | Admitting: *Deleted

## 2013-05-17 DIAGNOSIS — Z7901 Long term (current) use of anticoagulants: Secondary | ICD-10-CM

## 2013-05-17 DIAGNOSIS — I4891 Unspecified atrial fibrillation: Secondary | ICD-10-CM

## 2013-05-17 LAB — POCT INR: INR: 2.6

## 2013-06-28 ENCOUNTER — Ambulatory Visit (INDEPENDENT_AMBULATORY_CARE_PROVIDER_SITE_OTHER): Payer: Medicare Other | Admitting: Internal Medicine

## 2013-06-28 ENCOUNTER — Ambulatory Visit (INDEPENDENT_AMBULATORY_CARE_PROVIDER_SITE_OTHER): Payer: Medicare Other | Admitting: *Deleted

## 2013-06-28 ENCOUNTER — Encounter: Payer: Self-pay | Admitting: Internal Medicine

## 2013-06-28 VITALS — BP 133/66 | HR 76 | Ht 65.0 in | Wt 184.0 lb

## 2013-06-28 DIAGNOSIS — I255 Ischemic cardiomyopathy: Secondary | ICD-10-CM

## 2013-06-28 DIAGNOSIS — I472 Ventricular tachycardia: Secondary | ICD-10-CM

## 2013-06-28 DIAGNOSIS — I4891 Unspecified atrial fibrillation: Secondary | ICD-10-CM

## 2013-06-28 DIAGNOSIS — Z95810 Presence of automatic (implantable) cardiac defibrillator: Secondary | ICD-10-CM

## 2013-06-28 DIAGNOSIS — Z7901 Long term (current) use of anticoagulants: Secondary | ICD-10-CM

## 2013-06-28 DIAGNOSIS — R55 Syncope and collapse: Secondary | ICD-10-CM

## 2013-06-28 DIAGNOSIS — I5022 Chronic systolic (congestive) heart failure: Secondary | ICD-10-CM

## 2013-06-28 DIAGNOSIS — I2589 Other forms of chronic ischemic heart disease: Secondary | ICD-10-CM

## 2013-06-28 LAB — ICD DEVICE OBSERVATION
AL AMPLITUDE: 4 mv
AL IMPEDENCE ICD: 512.5 Ohm
ATRIAL PACING ICD: 99.17 pct
BAMS-0001: 160 {beats}/min
LV LEAD IMPEDENCE ICD: 575 Ohm
LV LEAD THRESHOLD: 1.375 V
RV LEAD IMPEDENCE ICD: 600 Ohm
TOT-0007: 1
TOT-0008: 0
TZAT-0004FASTVT: 8
TZAT-0013FASTVT: 1
TZAT-0013SLOWVT: 3
TZAT-0018FASTVT: NEGATIVE
TZON-0003FASTVT: 285 ms
TZST-0001FASTVT: 3
TZST-0001SLOWVT: 2
TZST-0001SLOWVT: 5
TZST-0003FASTVT: 40 J
TZST-0003FASTVT: 40 J
TZST-0003SLOWVT: 20 J
TZST-0003SLOWVT: 30 J
TZST-0003SLOWVT: 40 J
VENTRICULAR PACING ICD: 99.58 pct
VF: 1

## 2013-06-28 NOTE — Progress Notes (Signed)
Patient Care Team: Hendricks Limes, MD as PCP - General   HPI  Mason Arnold is a 72 y.o. male he is seen in followup for ischemic cardiomyopathy status post coronary bypass graft, he is status post ICD implantation; underwent generator replacement in October 2010.  His last Myoview was performed in July of 2011. At that time, he had an ejection fraction of 28%. There was a previous infarct involving the anterior wall, apex, and inferior wall with minimal ischemia noted  Echocardiogram in July of 2013 showed an ejection fraction of 20-25%. There was mild left ventricular enlargement, Moderate to severe left atrial enlargement and mildly reduced RV function  He has dyspnea on exertion wth LBBB at 146 msec . January 2014 he underwent CRT upgrade. When he saw Dr. London Sheer February 2014 dyspnea was apparently much improved  This remains so  He ahs had two episodes of syncope recently, both without warning  Interrogation of his device demonstrated one episode of VT-NS but we are not sure if it is the same day as his syncope      Past Medical History  Diagnosis Date  . Gout   . Ventricular tachycardia   . Ischemic heart disease   . Congestive heart failure   . Left bundle branch block   . Hyperlipidemia   . Pacemaker   . ICD (implantable cardiac defibrillator) in place   . Hypertension   . Heart murmur   . Anginal pain   . Myocardial infarction 2000; 2002; 2004; ~ 2006  . Pneumonia     "once, I think" (12/16/2012)  . Diabetes mellitus type II   . Arthritis     "feet, arms, hands" (12/16/2012)  . Gout     Past Surgical History  Procedure Laterality Date  . Coronary artery bypass graft  2000    CABG X4  . Urachal cyst excision  ~ 1995  . Vasectomy  ?1967  . A-v cardiac pacemaker insertion  2002; 2010  . Insert / replace / remove pacemaker  12/16/2012    LV lead placement; ICD assessment   . Tonsillectomy and adenoidectomy  1954  . Cardiac catheterization      "3 or 4; never had  interventions" (12/16/2012)    Current Outpatient Prescriptions  Medication Sig Dispense Refill  . acetaminophen (TYLENOL) 500 MG tablet Take 500 mg by mouth every 6 (six) hours as needed. For pain      . allopurinol (ZYLOPRIM) 100 MG tablet Take 100 mg by mouth daily.        . calcium carbonate (OS-CAL) 600 MG TABS Take 600 mg by mouth daily.        . carvedilol (COREG) 25 MG tablet Take 12.5 mg by mouth 2 (two) times daily with a meal.       . COLCRYS 0.6 MG tablet Take 0.6 mg by mouth 2 (two) times daily as needed. For gout flares      . famotidine (PEPCID) 10 MG tablet Take 10 mg by mouth daily.       . flunisolide (NASALIDE) 25 MCG/ACT (0.025%) SOLN Inhale 2 sprays into the lungs 2 (two) times daily.      Marland Kitchen FLUoxetine (PROZAC) 40 MG capsule Take 40 mg by mouth daily.      . folic acid (FOLVITE) A999333 MCG tablet Take 400 mcg by mouth daily.        . furosemide (LASIX) 40 MG tablet 80 mg every morning and 40 mg every evening      .  insulin glargine (LANTUS) 100 UNIT/ML injection Inject 70 Units into the skin at bedtime. Sliding scale, 70-90      . loratadine (CLARITIN) 10 MG tablet Take 10 mg by mouth daily as needed. For allergies      . Multiple Vitamin (MULTIVITAMIN) tablet Take 1 tablet by mouth daily.       Marland Kitchen omega-3 acid ethyl esters (LOVAZA) 1 G capsule Take 2 g by mouth 2 (two) times daily.        . potassium chloride SA (K-DUR,KLOR-CON) 20 MEQ tablet Take 20 mEq by mouth daily.       . pravastatin (PRAVACHOL) 80 MG tablet Take 1 tablet (80 mg total) by mouth every evening.  90 tablet  3  . ramipril (ALTACE) 2.5 MG capsule Take 1 capsule (2.5 mg total) by mouth daily.  90 capsule  3  . spironolactone (ALDACTONE) 25 MG tablet Take 25 mg by mouth daily.        Marland Kitchen warfarin (COUMADIN) 5 MG tablet Take 0.5-1 tablets (2.5-5 mg total) by mouth daily. Take as directed by [facility]  90 tablet  3   No current facility-administered medications for this visit.    Allergies  Allergen  Reactions  . Ampicillin Rash  . Crestor (Rosuvastatin Calcium) Rash and Other (See Comments)    Rash as nodules ("like strawberries")  . Orange Fruit (Citrus) Swelling and Other (See Comments)    Lips swelling, severe headache   . Propoxyphene-Acetaminophen Other (See Comments)    hallucinations      Review of Systems negative except from HPI and PMH  Physical Exam BP 133/66  Pulse 76  Ht 5\' 5"  (1.651 m)  Wt 184 lb (83.462 kg)  BMI 30.62 kg/m2 Well developed and nourished in no acute distress HENT normal Neck supple with JVP-flat Clear Regular rate and rhythm, no murmurs or gallops Abd-soft with active BS No Clubbing cyanosis edema Skin-warm and dry A & Oriented  Grossly normal sensory and motor function     Assessment and  Plan Q waves appear to do you have the first degree block to questions were Q waves in in a and and in in sentences of one and a half liter and in no

## 2013-06-28 NOTE — Progress Notes (Signed)
Tingling in fingers @ 2 mins

## 2013-06-28 NOTE — Progress Notes (Signed)
No symptoms @ 3 mins.

## 2013-06-28 NOTE — Patient Instructions (Signed)
Your physician has recommended that you have an AV optimization echo. During this procedure, an echocardiogram is performed to optimize the timing of your device using ultrasound and a device programmer. Changes will be made to the device settings to help the heart chambers pump more efficiently. This procedure takes approximately one hour.  Your physician recommends that you schedule a follow-up appointment in: 1-2 months with Dr. Stanford Breed.  Remote monitoring is used to monitor your Pacemaker of ICD from home. This monitoring reduces the number of office visits required to check your device to one time per year. It allows [place] to keep an eye on the functioning of your device to ensure it is working properly. You are scheduled for a device check from home on 10/03/13. You may send your transmission at any time that day. If you have a wireless device, the transmission will be sent automatically. After your physician reviews your transmission, you will receive a postcard with your next transmission date.  Call tomorrow with the date of when you passed out.

## 2013-06-29 DIAGNOSIS — I472 Ventricular tachycardia: Secondary | ICD-10-CM | POA: Insufficient documentation

## 2013-06-29 DIAGNOSIS — R55 Syncope and collapse: Secondary | ICD-10-CM | POA: Insufficient documentation

## 2013-06-29 NOTE — Assessment & Plan Note (Addendum)
The pt has two episodes and we need to clarify whether there is any relationship to the episode fo VT that was detected.he unfortunately did not write it on the cdalendar as he thought he had   With history of OI, i am not sure as to the relationship of the VT as there seems to be no assoc rhythm with the second episode and not clearly with the first  Still we will make the device treat the vT faster as there can be syncope with longer VT and will lower the detection rate in the event there was a second vT below the detection rate  Will hold off yet on AA medication

## 2013-06-29 NOTE — Assessment & Plan Note (Signed)
Will reprogream AV delay on his CRt based on recent data supporting 55% of intrinsic and benefit of about 100-120 msec Also will arrange for AV optimization echo

## 2013-06-29 NOTE — Assessment & Plan Note (Signed)
As above.

## 2013-06-29 NOTE — Assessment & Plan Note (Signed)
contnje current meds;  Last K 4./14 was 4.0

## 2013-06-29 NOTE — Assessment & Plan Note (Signed)
The patient's device was interrogated and the information was fully reviewed.  The device was reprogrammed as above

## 2013-07-05 ENCOUNTER — Other Ambulatory Visit: Payer: Self-pay | Admitting: Cardiology

## 2013-07-19 ENCOUNTER — Ambulatory Visit (HOSPITAL_COMMUNITY): Payer: Medicare Other | Attending: Cardiology

## 2013-07-19 DIAGNOSIS — I255 Ischemic cardiomyopathy: Secondary | ICD-10-CM

## 2013-07-19 DIAGNOSIS — I080 Rheumatic disorders of both mitral and aortic valves: Secondary | ICD-10-CM | POA: Insufficient documentation

## 2013-07-19 DIAGNOSIS — R55 Syncope and collapse: Secondary | ICD-10-CM | POA: Insufficient documentation

## 2013-07-19 LAB — ICD DEVICE OBSERVATION
AL IMPEDENCE ICD: 512.5 Ohm
BAMS-0001: 160 {beats}/min
BAMS-0003: 70 {beats}/min
LV LEAD IMPEDENCE ICD: 612.5 Ohm
RV LEAD AMPLITUDE: 12 mv
TOT-0007: 1
TOT-0008: 0
TOT-0009: 2
TZAT-0004FASTVT: 8
TZAT-0004SLOWVT: 8
TZAT-0012FASTVT: 200 ms
TZAT-0013SLOWVT: 3
TZAT-0018FASTVT: NEGATIVE
TZAT-0018SLOWVT: NEGATIVE
TZAT-0019FASTVT: 7.5 V
TZON-0003FASTVT: 285 ms
TZON-0003SLOWVT: 375 ms
TZON-0004FASTVT: 24
TZON-0005FASTVT: 6
TZON-0010FASTVT: 80 ms
TZST-0001FASTVT: 3
TZST-0001FASTVT: 4
TZST-0001SLOWVT: 3
TZST-0001SLOWVT: 5
TZST-0003FASTVT: 40 J
TZST-0003SLOWVT: 30 J
TZST-0003SLOWVT: 40 J
VENTRICULAR PACING ICD: 99.03 pct
VF: 0

## 2013-07-19 NOTE — Progress Notes (Signed)
Echocardiogram performed.  

## 2013-08-28 ENCOUNTER — Encounter (HOSPITAL_COMMUNITY): Payer: Self-pay | Admitting: Emergency Medicine

## 2013-08-28 ENCOUNTER — Emergency Department (HOSPITAL_COMMUNITY): Payer: Medicare Other

## 2013-08-28 ENCOUNTER — Emergency Department (HOSPITAL_COMMUNITY)
Admission: EM | Admit: 2013-08-28 | Discharge: 2013-08-28 | Disposition: A | Discharge status: Home / Self Care | Payer: Medicare Other | Attending: Emergency Medicine | Admitting: Emergency Medicine

## 2013-08-28 DIAGNOSIS — J40 Bronchitis, not specified as acute or chronic: Secondary | ICD-10-CM | POA: Insufficient documentation

## 2013-08-28 DIAGNOSIS — Z7901 Long term (current) use of anticoagulants: Secondary | ICD-10-CM | POA: Insufficient documentation

## 2013-08-28 DIAGNOSIS — R55 Syncope and collapse: Secondary | ICD-10-CM | POA: Insufficient documentation

## 2013-08-28 DIAGNOSIS — I252 Old myocardial infarction: Secondary | ICD-10-CM | POA: Insufficient documentation

## 2013-08-28 DIAGNOSIS — Z8701 Personal history of pneumonia (recurrent): Secondary | ICD-10-CM | POA: Insufficient documentation

## 2013-08-28 DIAGNOSIS — E119 Type 2 diabetes mellitus without complications: Secondary | ICD-10-CM | POA: Insufficient documentation

## 2013-08-28 DIAGNOSIS — R05 Cough: Secondary | ICD-10-CM

## 2013-08-28 DIAGNOSIS — Z951 Presence of aortocoronary bypass graft: Secondary | ICD-10-CM | POA: Insufficient documentation

## 2013-08-28 DIAGNOSIS — I509 Heart failure, unspecified: Secondary | ICD-10-CM | POA: Insufficient documentation

## 2013-08-28 DIAGNOSIS — R011 Cardiac murmur, unspecified: Secondary | ICD-10-CM | POA: Insufficient documentation

## 2013-08-28 DIAGNOSIS — I209 Angina pectoris, unspecified: Secondary | ICD-10-CM | POA: Insufficient documentation

## 2013-08-28 DIAGNOSIS — Z8679 Personal history of other diseases of the circulatory system: Secondary | ICD-10-CM | POA: Insufficient documentation

## 2013-08-28 DIAGNOSIS — Z950 Presence of cardiac pacemaker: Secondary | ICD-10-CM | POA: Insufficient documentation

## 2013-08-28 DIAGNOSIS — M109 Gout, unspecified: Secondary | ICD-10-CM | POA: Insufficient documentation

## 2013-08-28 DIAGNOSIS — Z794 Long term (current) use of insulin: Secondary | ICD-10-CM | POA: Insufficient documentation

## 2013-08-28 DIAGNOSIS — I2510 Atherosclerotic heart disease of native coronary artery without angina pectoris: Secondary | ICD-10-CM | POA: Insufficient documentation

## 2013-08-28 DIAGNOSIS — Z95810 Presence of automatic (implantable) cardiac defibrillator: Secondary | ICD-10-CM | POA: Insufficient documentation

## 2013-08-28 DIAGNOSIS — Z79899 Other long term (current) drug therapy: Secondary | ICD-10-CM | POA: Insufficient documentation

## 2013-08-28 DIAGNOSIS — E785 Hyperlipidemia, unspecified: Secondary | ICD-10-CM | POA: Insufficient documentation

## 2013-08-28 DIAGNOSIS — IMO0002 Reserved for concepts with insufficient information to code with codable children: Secondary | ICD-10-CM | POA: Insufficient documentation

## 2013-08-28 DIAGNOSIS — M129 Arthropathy, unspecified: Secondary | ICD-10-CM | POA: Insufficient documentation

## 2013-08-28 LAB — COMPREHENSIVE METABOLIC PANEL
ALT: 33 U/L (ref 0–53)
AST: 33 U/L (ref 0–37)
Albumin: 4.1 g/dL (ref 3.5–5.2)
Alkaline Phosphatase: 60 U/L (ref 39–117)
BUN: 17 mg/dL (ref 6–23)
CO2: 27 mEq/L (ref 19–32)
Calcium: 8.7 mg/dL (ref 8.4–10.5)
Chloride: 92 mEq/L — ABNORMAL LOW (ref 96–112)
Creatinine, Ser: 1.22 mg/dL (ref 0.50–1.35)
GFR calc Af Amer: 66 mL/min — ABNORMAL LOW (ref >90)
GFR calc non Af Amer: 57 mL/min — ABNORMAL LOW (ref >90)
Glucose, Bld: 144 mg/dL — ABNORMAL HIGH (ref 70–99)
Potassium: 3.3 mEq/L — ABNORMAL LOW (ref 3.5–5.1)
Sodium: 133 mEq/L — ABNORMAL LOW (ref 135–145)
Total Bilirubin: 0.6 mg/dL (ref 0.3–1.2)
Total Protein: 7.3 g/dL (ref 6.0–8.3)

## 2013-08-28 LAB — CBC WITH DIFFERENTIAL/PLATELET
Basophils Absolute: 0 10*3/uL (ref 0.0–0.1)
Basophils Relative: 1 % (ref 0–1)
Eosinophils Relative: 5 % (ref 0–5)
HCT: 40.9 % (ref 39.0–52.0)
Lymphocytes Relative: 14 % (ref 12–46)
Lymphs Abs: 1.2 10*3/uL (ref 0.7–4.0)
MCH: 31 pg (ref 26.0–34.0)
MCHC: 35.5 g/dL (ref 30.0–36.0)
MCV: 87.6 fL (ref 78.0–100.0)
Monocytes Absolute: 1 10*3/uL (ref 0.1–1.0)
Monocytes Relative: 11 % (ref 3–12)
Neutro Abs: 6.1 10*3/uL (ref 1.7–7.7)
Platelets: 165 10*3/uL (ref 150–400)
RBC: 4.67 MIL/uL (ref 4.22–5.81)
RDW: 13.6 % (ref 11.5–15.5)
WBC: 8.8 10*3/uL (ref 4.0–10.5)

## 2013-08-28 LAB — POCT I-STAT TROPONIN I

## 2013-08-28 LAB — PROTIME-INR: INR: 2.78 — ABNORMAL HIGH (ref 0.00–1.49)

## 2013-08-28 MED ORDER — ALBUTEROL SULFATE HFA 108 (90 BASE) MCG/ACT IN AERS: 2.0000 | INHALATION_SPRAY | RESPIRATORY_TRACT | Status: DC | PRN

## 2013-08-28 MED ORDER — ALBUTEROL SULFATE (5 MG/ML) 0.5% IN NEBU
5.0000 mg | INHALATION_SOLUTION | Freq: Once | RESPIRATORY_TRACT | Status: AC
  Administered 2013-08-28: 5 mg via RESPIRATORY_TRACT
  Filled 2013-08-28: qty 1

## 2013-08-28 MED ORDER — GUAIFENESIN-CODEINE 100-10 MG/5ML PO SOLN: 5.0000 mL | Freq: Three times a day (TID) | ORAL | Status: DC | PRN

## 2013-08-28 NOTE — ED Provider Notes (Signed)
CSN: [id number]     Arrival date & time 08/28/13  0919 History   First MD Initiated Contact with Patient 08/28/13 [phone]     Chief Complaint  Patient presents with  . Loss of Consciousness   (Consider location/radiation/quality/duration/timing/severity/associated sxs/prior Treatment) HPI Mason Arnold is a 72 y.o. male with history of coronary disease, congestive heart failure, arrhythmias, with implant in cardiac pacer defibrillator. Patient is coming in complaining of syncopal episodes. Patient states he has had upper respiratory infection for about a week, states he has had a cough. States that yesterday he he has had 7 syncopal episodes yesterday most him after having a coughing spell. He said that he did have one after getting up from sitting position. Patient states that he feels like he noticed that episode is coming however denies any dizziness. According to his wife patient goes unresponsive, states his eyes rolled back, and states that she has to shake him for him to come back to normal. When patient comes back to it he is alert however still time does not recall an episode and does not know where he is at that time. Patient states that he has history of the same, states has had multiple syncopal episodes while in the hospital after his MRI. States he has had extensive workup with no diagnosis. Patient states he also had his pacemaker adjusted several months ago for similar reason. Patient denies any fever, chills, malaise. He denies any chest pain or shortness of breath. He denies any changes in his medications. Past Medical History  Diagnosis Date  . Gout   . Ventricular tachycardia   . Ischemic heart disease   . Congestive heart failure   . Left bundle branch block   . Hyperlipidemia   . Pacemaker   . ICD (implantable cardiac defibrillator) in place   . Hypertension   . Heart murmur   . Anginal pain   . Myocardial infarction 2000; 2002; 2004; ~ 2006  . Pneumonia     "once, I  think" (12/16/2012)  . Diabetes mellitus type II   . Arthritis     "feet, arms, hands" (12/16/2012)  . Gout    Past Surgical History  Procedure Laterality Date  . Coronary artery bypass graft  2000    CABG X4  . Urachal cyst excision  ~ 1995  . Vasectomy  ?1967  . A-v cardiac pacemaker insertion  2002; 2010  . Insert / replace / remove pacemaker  12/16/2012    LV lead placement; ICD assessment   . Tonsillectomy and adenoidectomy  1954  . Cardiac catheterization      "3 or 4; never had interventions" (12/16/2012)   Family History  Problem Relation Age of Onset  . Heart attack Maternal Grandfather 78  . Heart attack Maternal Grandmother 82  . Heart attack Paternal Grandfather 29  . Heart attack Father 53  . Heart failure Father 30  . Aneurysm Brother    History  Substance Use Topics  . Smoking status: Never Smoker   . Smokeless tobacco: Former Systems developer    Types: Snuff, Chew     Comment: 12/16/2012 "stopped chew/snuff in ~ 1992 after 15 yr"  . Alcohol Use: 4.2 oz/week    7 Shots of liquor per week     Comment: one shot a day    Review of Systems  Constitutional: Negative for fever and chills.  HENT: Negative for neck pain and neck stiffness.   Respiratory: Positive for cough. Negative for chest tightness  and shortness of breath.   Cardiovascular: Negative for chest pain, palpitations and leg swelling.  Gastrointestinal: Negative for nausea, vomiting, abdominal pain, diarrhea and abdominal distention.  Genitourinary: Negative for dysuria, urgency, frequency and hematuria.  Musculoskeletal: Negative for myalgias and arthralgias.  Skin: Negative for rash.  Allergic/Immunologic: Negative for immunocompromised state.  Neurological: Positive for syncope. Negative for dizziness, weakness, light-headedness, numbness and headaches.    Allergies  Ampicillin; Crestor; Orange fruit; and Propoxyphene-acetaminophen  Home Medications   Current Outpatient Rx  Name  Route  Sig  Dispense   Refill  . acetaminophen (TYLENOL) 500 MG tablet   Oral   Take 500 mg by mouth every 6 (six) hours as needed. For pain         . allopurinol (ZYLOPRIM) 100 MG tablet   Oral   Take 100 mg by mouth daily.           . calcium carbonate (OS-CAL) 600 MG TABS   Oral   Take 600 mg by mouth daily.           . carvedilol (COREG) 25 MG tablet   Oral   Take 12.5 mg by mouth 2 (two) times daily with a meal.          . COLCRYS 0.6 MG tablet   Oral   Take 0.6 mg by mouth 2 (two) times daily as needed. For gout flares         . famotidine (PEPCID) 10 MG tablet   Oral   Take 10 mg by mouth daily.          . flunisolide (NASALIDE) 25 MCG/ACT (0.025%) SOLN   Inhalation   Inhale 2 sprays into the lungs 2 (two) times daily.         Marland Kitchen FLUoxetine (PROZAC) 40 MG capsule   Oral   Take 40 mg by mouth daily.         . folic acid (FOLVITE) A999333 MCG tablet   Oral   Take 400 mcg by mouth daily.           . furosemide (LASIX) 40 MG tablet      80 mg every morning and 40 mg every evening         . insulin glargine (LANTUS) 100 UNIT/ML injection   Subcutaneous   Inject 70 Units into the skin at bedtime. Sliding scale, 70-90         . loratadine (CLARITIN) 10 MG tablet   Oral   Take 10 mg by mouth daily as needed. For allergies         . Multiple Vitamin (MULTIVITAMIN) tablet   Oral   Take 1 tablet by mouth daily.          Marland Kitchen omega-3 acid ethyl esters (LOVAZA) 1 G capsule   Oral   Take 2 g by mouth 2 (two) times daily.           . potassium chloride SA (K-DUR,KLOR-CON) 20 MEQ tablet   Oral   Take 1 tablet (20 mEq total) by mouth daily.   90 tablet   3   . pravastatin (PRAVACHOL) 80 MG tablet   Oral   Take 1 tablet (80 mg total) by mouth every evening.   90 tablet   3   . ramipril (ALTACE) 2.5 MG capsule   Oral   Take 1 capsule (2.5 mg total) by mouth daily.   90 capsule   3     **APPOINTMENT OVER-DUE 2nd Reminder**   .  spironolactone (ALDACTONE) 25  MG tablet   Oral   Take 25 mg by mouth daily.           Marland Kitchen warfarin (COUMADIN) 5 MG tablet   Oral   Take 0.5-1 tablets (2.5-5 mg total) by mouth daily. Take as directed by [facility]   90 tablet   3    BP 111/67  Pulse 59  Temp(Src) 97.8 F (36.6 C) (Oral)  Resp 18  Ht 5\' 5"  (1.651 m)  Wt 186 lb (84.369 kg)  BMI 30.95 kg/m2  SpO2 95% Physical Exam  Nursing note and vitals reviewed. Constitutional: He is oriented to person, place, and time. He appears well-developed and well-nourished. No distress.  HENT:  Head: Normocephalic and atraumatic.  Eyes: Conjunctivae are normal. Pupils are equal, round, and reactive to light.  Neck: Neck supple.  Cardiovascular: Normal rate, regular rhythm and normal heart sounds.   Pulmonary/Chest: Effort normal. No respiratory distress. He has no wheezes. He has no rales.  Abdominal: Soft. Bowel sounds are normal. He exhibits no distension. There is no tenderness. There is no rebound.  Musculoskeletal: He exhibits no edema.  Neurological: He is alert and oriented to person, place, and time. No cranial nerve deficit. Coordination normal.  Skin: Skin is warm and dry.    ED Course  Procedures (including critical care time) Labs Review Labs Reviewed  COMPREHENSIVE METABOLIC PANEL - Abnormal; Notable for the following:    Sodium 133 (*)    Potassium 3.3 (*)    Chloride 92 (*)    Glucose, Bld 144 (*)    GFR calc non Af Amer 57 (*)    GFR calc Af Amer 66 (*)    All other components within normal limits  PRO B NATRIURETIC PEPTIDE - Abnormal; Notable for the following:    Pro B Natriuretic peptide (BNP) 937.4 (*)    All other components within normal limits  PROTIME-INR - Abnormal; Notable for the following:    Prothrombin Time 28.4 (*)    INR 2.78 (*)    All other components within normal limits  CBC WITH DIFFERENTIAL  POCT I-STAT TROPONIN I   Imaging Review Dg Chest 2 View  08/28/2013   *RADIOLOGY REPORT*  Clinical Data: Episodes  of loss consciousness  CHEST - 2 VIEW  Comparison: December 17, 2012  Findings: There is no focal infiltrate, pulmonary edema, or pleural effusion.  The patient is status post prior CABG.  Heart size is normal. Cardiac pacemaker is unchanged.  The soft tissues and osseous structures are stable.  IMPRESSION: No acute cardiopulmonary disease identified   Original Report Authenticated By: Abelardo Diesel, M.D.     Date: 08/28/2013  Rate: 70  Rhythm: dial paced rhythm  Old EKG Reviewed: unchanged   MDM   1. Cough syncope   2. Bronchitis     Pt's pacemaker interrogated. No arrhythmias. Pacer working normally.    1:14 PM Spoke with Dr. Lovena Le, cardiology. Discussed pt's symptoms and work up. Given pt's exam findings, his normal pacemaker intterigation, negative labs and CXR, suspect most likely cough syncope. According to Dr. Lovena Le, no evidence for bradyotachy arrhythmias. Pt safe to d/c home with precautions to not be standing when coughing, drink plenty of fluids.   1:53 PM Pt feels much better after breathing treatment. Cough improved. He has not had any syncopal episodes in ED. He is comfortable with going home. Will start on inhaler at home. Tussionex for cough. Robitussin. Follow up with PCP. He has  an apt with Dr. Stanford Breed in one week.   Filed Vitals:   08/28/13 1345 08/28/13 1400 08/28/13 1415 08/28/13 1430  BP: 117/64 118/64 117/58 106/58  Pulse: 59 64 65 64  Temp:      TempSrc:      Resp: 20 18 16 16   Height:      Weight:      SpO2: 88% 95% 94% 93%     Tonnie Friedel A Devynn Hessler, PA-C 08/28/13 1550

## 2013-08-28 NOTE — ED Notes (Signed)
Patient was discharged using the teach back method, patient verbalizes an understanding.NAD noted at the time of discharge.

## 2013-08-28 NOTE — ED Provider Notes (Signed)
Medical screening examination/treatment/procedure(s) were conducted as a shared visit with non-physician practitioner(s) and myself.  I personally evaluated the patient during the encounter  Pt is a 72 y.o. male with Pmhx as above who presents with repeat episodes of syncope triggered by cough.  Pt reports having similar symptoms in past, had recent negative w/u for seizures.  On PE, VSS, pt in NAD, has some wheezing on lung exam.  Pacemaker interrogated w/o arrhythmias for symptoms w/ a paced brady or tachy rate.  Cardiology consulted, feels pt should not be admitted given prior w/u and relation to cough.      1. Cough syncope   2. Bronchitis       Neta Ehlers, MD 08/28/13 [phone]

## 2013-08-28 NOTE — ED Notes (Signed)
Pt with multiple syncopal episodes over last couple of days; pt denies pain but sts some SOB; pt with hx of AICD

## 2013-08-28 NOTE — ED Notes (Signed)
Patient transported to X-ray 

## 2013-09-05 ENCOUNTER — Ambulatory Visit (INDEPENDENT_AMBULATORY_CARE_PROVIDER_SITE_OTHER): Payer: Medicare Other | Admitting: Cardiology

## 2013-09-05 ENCOUNTER — Encounter: Payer: Self-pay | Admitting: Cardiology

## 2013-09-05 ENCOUNTER — Ambulatory Visit: Payer: Medicare Other | Admitting: Cardiology

## 2013-09-05 VITALS — BP 112/62 | HR 64 | Ht 65.0 in | Wt 189.0 lb

## 2013-09-05 DIAGNOSIS — Z7901 Long term (current) use of anticoagulants: Secondary | ICD-10-CM

## 2013-09-05 DIAGNOSIS — R55 Syncope and collapse: Secondary | ICD-10-CM

## 2013-09-05 DIAGNOSIS — I2510 Atherosclerotic heart disease of native coronary artery without angina pectoris: Secondary | ICD-10-CM

## 2013-09-05 DIAGNOSIS — I4891 Unspecified atrial fibrillation: Secondary | ICD-10-CM

## 2013-09-05 LAB — BASIC METABOLIC PANEL
BUN: 20 mg/dL (ref 6–23)
CO2: 28 mEq/L (ref 19–32)
Chloride: 95 mEq/L — ABNORMAL LOW (ref 96–112)
GFR: 64.2 mL/min (ref >60.00)
Glucose, Bld: 148 mg/dL — ABNORMAL HIGH (ref 70–99)
Potassium: 4.1 mEq/L (ref 3.5–5.1)
Sodium: 134 mEq/L — ABNORMAL LOW (ref 135–145)

## 2013-09-05 MED ORDER — FUROSEMIDE 80 MG PO TABS: 80.0000 mg | ORAL_TABLET | Freq: Two times a day (BID) | ORAL | Status: DC

## 2013-09-05 MED ORDER — CARVEDILOL 6.25 MG PO TABS: 6.2500 mg | ORAL_TABLET | Freq: Two times a day (BID) | ORAL | Status: DC

## 2013-09-05 NOTE — Assessment & Plan Note (Signed)
Continue statin. Check Myoview.

## 2013-09-05 NOTE — Progress Notes (Signed)
HPI: Mason Arnold is a pleasant gentleman who has a history of coronary artery disease status post coronary bypass graft, as well as an ischemic cardiomyopathy and hyperlipidemia. His last Myoview was performed in July of 2011. At that time, he had an ejection fraction of 28%. There was a previous infarct involving the anterior wall, apex, and inferior wall with minimal ischemia noted. Patient had CRT upgrade in January 2014. Echocardiogram in August of 2014 showed an ejection fraction of 0000000, grade 2 diastolic dysfunction, mild left atrial enlargement, mild aortic and mitral regurgitation. Carotid Dopplers in May 2014 showed no significant stenosis. Patient has had problems with syncope recently. Seen in the emergency room on September 21. It was felt to be cough related. His pacemaker showed no significant arrhythmias. Patient has mild dyspnea on exertion. He has also noticed increased cough with lying flat that improves with sitting up. He has not had chest pain. He has had multiple syncopal episodes in the past 6 months. He has some dizziness with standing. These episodes are not associated with chest pain, palpitations or dyspnea. His defibrillator has not fired.   Current Outpatient Prescriptions  Medication Sig Dispense Refill  . acetaminophen (TYLENOL) 500 MG tablet Take 500 mg by mouth every 6 (six) hours as needed. For pain      . albuterol (PROVENTIL HFA;VENTOLIN HFA) 108 (90 BASE) MCG/ACT inhaler Inhale 2 puffs into the lungs every 4 (four) hours as needed for wheezing.  1 Inhaler  1  . allopurinol (ZYLOPRIM) 100 MG tablet Take 100 mg by mouth daily.        . calcium carbonate (OS-CAL) 600 MG TABS Take 600 mg by mouth daily.        . carvedilol (COREG) 25 MG tablet Take 12.5 mg by mouth 2 (two) times daily with a meal.       . COLCRYS 0.6 MG tablet Take 0.6 mg by mouth 2 (two) times daily as needed. For gout flares      . famotidine (PEPCID) 10 MG tablet Take 10 mg by mouth daily.        . flunisolide (NASALIDE) 25 MCG/ACT (0.025%) SOLN Inhale 2 sprays into the lungs 2 (two) times daily.      Marland Kitchen FLUoxetine (PROZAC) 40 MG capsule Take 40 mg by mouth daily.      . folic acid (FOLVITE) A999333 MCG tablet Take 400 mcg by mouth daily.        . furosemide (LASIX) 40 MG tablet 80 mg every morning and 40 mg every evening      . guaiFENesin-codeine 100-10 MG/5ML syrup Take 5 mLs by mouth 3 (three) times daily as needed for cough.  120 mL  0  . insulin glargine (LANTUS) 100 UNIT/ML injection Inject 84 Units into the skin at bedtime. Sliding scale, 70-90      . loratadine (CLARITIN) 10 MG tablet Take 10 mg by mouth daily as needed. For allergies      . Multiple Vitamin (MULTIVITAMIN) tablet Take 1 tablet by mouth daily.       Marland Kitchen omega-3 acid ethyl esters (LOVAZA) 1 G capsule Take 2 g by mouth 2 (two) times daily.        . potassium chloride SA (K-DUR,KLOR-CON) 20 MEQ tablet Take 1 tablet (20 mEq total) by mouth daily.  90 tablet  3  . pravastatin (PRAVACHOL) 80 MG tablet Take 1 tablet (80 mg total) by mouth every evening.  90 tablet  3  . ramipril (ALTACE)  2.5 MG capsule Take 1 capsule (2.5 mg total) by mouth daily.  90 capsule  3  . spironolactone (ALDACTONE) 25 MG tablet Take 25 mg by mouth daily.        Marland Kitchen warfarin (COUMADIN) 5 MG tablet Take 2.5-5 mg by mouth See admin instructions. Take half a tablet (2.5mg ) daily on Mondays and Wednesdays. Take one tablet (5mg ) on all other days.       No current facility-administered medications for this visit.     Past Medical History  Diagnosis Date  . Gout   . Ventricular tachycardia   . Ischemic heart disease   . Congestive heart failure   . Left bundle branch block   . Hyperlipidemia   . Pacemaker   . ICD (implantable cardiac defibrillator) in place   . Hypertension   . Heart murmur   . Anginal pain   . Myocardial infarction 2000; 2002; 2004; ~ 2006  . Pneumonia     "once, I think" (12/16/2012)  . Diabetes mellitus type II   . Arthritis      "feet, arms, hands" (12/16/2012)  . Gout     Past Surgical History  Procedure Laterality Date  . Coronary artery bypass graft  2000    CABG X4  . Urachal cyst excision  ~ 1995  . Vasectomy  ?1967  . A-v cardiac pacemaker insertion  2002; 2010  . Insert / replace / remove pacemaker  12/16/2012    LV lead placement; ICD assessment   . Tonsillectomy and adenoidectomy  1954  . Cardiac catheterization      "3 or 4; never had interventions" (12/16/2012)    History   Social History  . Marital Status: Married    Spouse Name: N/A    Number of Children: N/A  . Years of Education: N/A   Occupational History  . Not on file.   Social History Main Topics  . Smoking status: Never Smoker   . Smokeless tobacco: Former Systems developer    Types: Snuff, Chew     Comment: 12/16/2012 "stopped chew/snuff in ~ 1992 after 15 yr"  . Alcohol Use: 4.2 oz/week    7 Shots of liquor per week     Comment: one shot a day  . Drug Use: No     Comment: none  . Sexual Activity: Yes   Other Topics Concern  . Not on file   Social History Narrative  . No narrative on file    ROS: no fevers or chills, productive cough, hemoptysis, dysphasia, odynophagia, melena, hematochezia, dysuria, hematuria, rash, seizure activity, orthopnea, PND, pedal edema, claudication. Remaining systems are negative.  Physical Exam: Well-developed well-nourished in no acute distress.  Skin is warm and dry.  HEENT is normal.  Neck is supple.  Chest is clear to auscultation with normal expansion.  Cardiovascular exam is regular rate and rhythm.  Abdominal exam nontender or distended. No masses palpated. Extremities show no edema. neuro grossly intact

## 2013-09-05 NOTE — Addendum Note (Signed)
Addended by: Eulis Foster on: 09/05/2013 09:41 AM   Modules accepted: Orders

## 2013-09-05 NOTE — Assessment & Plan Note (Addendum)
Etiology unclear. There is one episode that was cough related. There may be also an orthostatic component. We will have his device interrogated today. I will decrease his carvedilol to 6.25 mg by mouth twice a day to allow his blood pressure to run higher. Patient instructed not to drive for 6 months. Note patient was seen by neurology and events felt orthostatic mediated.

## 2013-09-05 NOTE — Assessment & Plan Note (Signed)
Management per EP.

## 2013-09-05 NOTE — Addendum Note (Signed)
Addended by: Golden Hurter D on: 09/05/2013 09:36 AM   Modules accepted: Orders

## 2013-09-05 NOTE — Patient Instructions (Addendum)
Stop Coumadin  Start Aspirin 325 mg daily   Schedule Myoview follow instructions given  Increase Lasix 80 mg twice a day  Decrease Coreg 6.25 mg twice a day  Lab work Paramedic ) today and in 4 weeks 10/05/13   Your physician recommends that you schedule a follow-up appointment in: 6 to 8 weeks

## 2013-09-05 NOTE — Assessment & Plan Note (Signed)
Patient is complaining of increased cough with lying flat. There may be a volume issue. Increase Lasix to 80 mg by mouth twice a day. Check potassium and renal function today and again in one week.

## 2013-09-05 NOTE — Assessment & Plan Note (Signed)
Continue statin. 

## 2013-09-05 NOTE — Assessment & Plan Note (Signed)
Plan continue beta blocker. Given his recurrent syncope I am hesitant to continue his Coumadin given risk. We will discontinue his Coumadin for now and begin aspirin 325 mg daily. He understands the higher risk of embolic event off of Coumadin. We will consider resuming if his syncopal episodes improved.

## 2013-09-05 NOTE — Assessment & Plan Note (Signed)
Continue ACE inhibitor and beta blocker. 

## 2013-09-08 ENCOUNTER — Encounter: Payer: Self-pay | Admitting: Cardiology

## 2013-09-15 ENCOUNTER — Ambulatory Visit (HOSPITAL_COMMUNITY): Payer: Medicare Other | Attending: Cardiovascular Disease | Admitting: Radiology

## 2013-09-15 VITALS — BP 116/74 | HR 60 | Ht 65.0 in | Wt 186.0 lb

## 2013-09-15 DIAGNOSIS — R05 Cough: Secondary | ICD-10-CM | POA: Insufficient documentation

## 2013-09-15 DIAGNOSIS — I447 Left bundle-branch block, unspecified: Secondary | ICD-10-CM | POA: Insufficient documentation

## 2013-09-15 DIAGNOSIS — R059 Cough, unspecified: Secondary | ICD-10-CM | POA: Insufficient documentation

## 2013-09-15 DIAGNOSIS — R0989 Other specified symptoms and signs involving the circulatory and respiratory systems: Secondary | ICD-10-CM | POA: Insufficient documentation

## 2013-09-15 DIAGNOSIS — R55 Syncope and collapse: Secondary | ICD-10-CM | POA: Insufficient documentation

## 2013-09-15 DIAGNOSIS — R42 Dizziness and giddiness: Secondary | ICD-10-CM | POA: Insufficient documentation

## 2013-09-15 DIAGNOSIS — R0609 Other forms of dyspnea: Secondary | ICD-10-CM | POA: Insufficient documentation

## 2013-09-15 DIAGNOSIS — I2510 Atherosclerotic heart disease of native coronary artery without angina pectoris: Secondary | ICD-10-CM

## 2013-09-15 MED ORDER — TECHNETIUM TC 99M SESTAMIBI GENERIC - CARDIOLITE
33.0000 | Freq: Once | INTRAVENOUS | Status: AC | PRN
  Administered 2013-09-15: 33 via INTRAVENOUS

## 2013-09-15 MED ORDER — REGADENOSON 0.4 MG/5ML IV SOLN
0.4000 mg | Freq: Once | INTRAVENOUS | Status: AC
  Administered 2013-09-15: 0.4 mg via INTRAVENOUS

## 2013-09-15 MED ORDER — TECHNETIUM TC 99M SESTAMIBI GENERIC - CARDIOLITE
11.0000 | Freq: Once | INTRAVENOUS | Status: AC | PRN
  Administered 2013-09-15: 11 via INTRAVENOUS

## 2013-09-15 NOTE — Progress Notes (Signed)
[facility] [address] [place], [place] [address] [id number]    Cardiology Nuclear Med Study  Mason Arnold is a 72 y.o. male     MRN : [id number]     DOB: 1941-04-24  Procedure Date: 09/15/2013  Nuclear Med Background Indication for Stress Test:  Evaluation for Ischemia and Graft Patency History:  2014 Echo EF 20-25%, 2011 MPS: EF 28%, Minimal ischemia, 1010 AICD, 2006 MI Heart Catheterization EF 15-20%, Grafts patent, 2000 CABG Cardiac Risk Factors: Family History - CAD, History of Smoking, Hypertension, IDDM Type 2, LBBB and Lipids  Symptoms:  Dizziness, DOE and Syncope with Coughing (had a spell where he "zoned out" last night at 8 pm with brief cough)   Nuclear Pre-Procedure Caffeine/Decaff Intake:  None NPO After: 6 am   Lungs:  Clear. O2 Sat: 96% on room air. IV 0.9% NS with Angio Cath:  22g  IV Site: R Antecubital  IV Started by:  Jennelle Human, CNMT  Chest Size (in):  46 Cup Size: n/a  Height: 5\' 5"  (1.651 m)  Weight:  186 lb (84.369 kg)  BMI:  Body mass index is 30.95 kg/(m^2). Tech Comments:  BS @ 6:30 am = 131    Nuclear Med Study 1 or 2 day study: 1 day  Stress Test Type:  Lexiscan  Reading MD: Mertie Moores, MD  Order Authorizing Provider:  Kirk Ruths, MD  Resting Radionuclide: Technetium 47m Sestamibi  Resting Radionuclide Dose: 11.0 mCi   Stress Radionuclide:  Technetium 70m Sestamibi  Stress Radionuclide Dose: 33.0 mCi           Stress Protocol Rest HR: 60 Stress HR: 68  Rest BP: 116/74 Stress BP: 117/76  Exercise Time (min): n/a METS: n/a   Predicted Max HR: 147 bpm % Max HR: 46.26 bpm Rate Pressure Product: 7956   Dose of Adenosine (mg):  n/a Dose of Lexiscan: 0.4 mg  Dose of Atropine (mg): n/a Dose of Dobutamine: n/a mcg/kg/min (at max HR)  Stress Test Technologist: Letta Moynahan, CMA-N  Nuclear Technologist:  Charlton Amor, CNMT     Rest Procedure:  Myocardial perfusion imaging was performed at rest 45  minutes following the intravenous administration of Technetium 63m Sestamibi.  Rest ECG: No acute changes and V pacing  Stress Procedure:  The patient received IV Lexiscan 0.4 mg over 15-seconds.  Technetium 76m Sestamibi injected at 30-seconds.  He c/o throat tightness with Lexiscan.  Quantitative spect images were obtained after a 45 minute delay.  Stress ECG: No significant change from baseline ECG  QPS Raw Data Images:  Normal; no motion artifact; normal heart/lung ratio. Stress Images:  The LV is markedly enlarged.  there is a large, severe defect in the anterior wall, apex,  and inferior wall . Rest Images:  The LV is markedly enlarged.  there is a large, severe defect in the anterior wall, apex,  and inferior wall  Subtraction (SDS):  No evidence of ischemia. Transient Ischemic Dilatation (Normal <1.22):  N/A Lung/Heart Ratio (Normal <0.45):  0.51  Quantitative Gated Spect Images QGS EDV:  303 ml QGS ESV:  240 ml  Impression Exercise Capacity:  Lexiscan with no exercise. BP Response:  Normal blood pressure response. Clinical Symptoms:  No significant symptoms noted. ECG Impression:  No significant ST segment change suggestive of ischemia. Comparison with Prior Nuclear Study: Compared to the previous study 06/27/10, the scintifigraphic images are unchanged.  The LV EF has decreased from 28% to 21%.  Overall Impression:  High risk stress nuclear study .  There is evidence of a previous large anterior an inferior MI.  This is unchanged from previous study.    .  LV Ejection Fraction: 21%.  LV Wall Motion:  The LV is markedly dilated.  there is global hypokinesis with akinesis of the distal anterior wall, apex and inferior apical walls.    Thayer Headings, Brooke Bonito., MD, [facility] 09/15/2013, 5:05 PM Office - [phone] Pager [phone]

## 2013-10-03 ENCOUNTER — Ambulatory Visit (INDEPENDENT_AMBULATORY_CARE_PROVIDER_SITE_OTHER): Payer: Medicare Other | Admitting: *Deleted

## 2013-10-03 DIAGNOSIS — I2589 Other forms of chronic ischemic heart disease: Secondary | ICD-10-CM

## 2013-10-03 DIAGNOSIS — I5022 Chronic systolic (congestive) heart failure: Secondary | ICD-10-CM

## 2013-10-03 DIAGNOSIS — I255 Ischemic cardiomyopathy: Secondary | ICD-10-CM

## 2013-10-03 DIAGNOSIS — I4891 Unspecified atrial fibrillation: Secondary | ICD-10-CM

## 2013-10-03 LAB — REMOTE ICD DEVICE
AL AMPLITUDE: 3.7 mv
ATRIAL PACING ICD: 97 pct
BAMS-0001: 160 {beats}/min
BAMS-0003: 70 {beats}/min
LV LEAD THRESHOLD: 2 V
RV LEAD AMPLITUDE: 12 mv
RV LEAD IMPEDENCE ICD: 600 Ohm
TZAT-0001FASTVT: 1
TZAT-0004FASTVT: 8
TZAT-0012FASTVT: 200 ms
TZAT-0013SLOWVT: 3
TZAT-0018FASTVT: NEGATIVE
TZAT-0019SLOWVT: 7.5 V
TZAT-0020FASTVT: 1 ms
TZAT-0020SLOWVT: 1 ms
TZON-0003FASTVT: 285 ms
TZON-0004SLOWVT: 24
TZON-0005FASTVT: 6
TZON-0010FASTVT: 80 ms
TZON-0010SLOWVT: 80 ms
TZST-0001FASTVT: 3
TZST-0001FASTVT: 4
TZST-0001SLOWVT: 2
TZST-0001SLOWVT: 5
TZST-0003FASTVT: 30 J
TZST-0003FASTVT: 40 J
TZST-0003FASTVT: 40 J
TZST-0003FASTVT: 40 J
TZST-0003SLOWVT: 40 J

## 2013-10-05 ENCOUNTER — Other Ambulatory Visit: Payer: Medicare Other

## 2013-10-05 ENCOUNTER — Other Ambulatory Visit (INDEPENDENT_AMBULATORY_CARE_PROVIDER_SITE_OTHER): Payer: Medicare Other

## 2013-10-05 DIAGNOSIS — I2510 Atherosclerotic heart disease of native coronary artery without angina pectoris: Secondary | ICD-10-CM

## 2013-10-05 DIAGNOSIS — R55 Syncope and collapse: Secondary | ICD-10-CM

## 2013-10-05 LAB — BASIC METABOLIC PANEL
BUN: 18 mg/dL (ref 6–23)
CO2: 34 mEq/L — ABNORMAL HIGH (ref 19–32)
Calcium: 9.3 mg/dL (ref 8.4–10.5)
Chloride: 96 mEq/L (ref 96–112)
GFR: 52.26 mL/min — ABNORMAL LOW (ref >60.00)
Glucose, Bld: 156 mg/dL — ABNORMAL HIGH (ref 70–99)
Sodium: 140 mEq/L (ref 135–145)

## 2013-10-13 ENCOUNTER — Other Ambulatory Visit: Payer: Self-pay

## 2013-10-13 NOTE — Progress Notes (Signed)
Remote defib received

## 2013-11-01 ENCOUNTER — Ambulatory Visit: Payer: Medicare Other | Admitting: Cardiology

## 2013-11-01 ENCOUNTER — Encounter: Payer: Self-pay | Admitting: Cardiology

## 2013-11-01 ENCOUNTER — Ambulatory Visit (INDEPENDENT_AMBULATORY_CARE_PROVIDER_SITE_OTHER): Payer: Medicare Other | Admitting: Cardiology

## 2013-11-01 VITALS — BP 122/86 | HR 83 | Ht 65.0 in | Wt 183.0 lb

## 2013-11-01 DIAGNOSIS — Z95810 Presence of automatic (implantable) cardiac defibrillator: Secondary | ICD-10-CM

## 2013-11-01 DIAGNOSIS — Z0000 Encounter for general adult medical examination without abnormal findings: Secondary | ICD-10-CM

## 2013-11-01 DIAGNOSIS — I10 Essential (primary) hypertension: Secondary | ICD-10-CM

## 2013-11-01 DIAGNOSIS — I2589 Other forms of chronic ischemic heart disease: Secondary | ICD-10-CM

## 2013-11-01 DIAGNOSIS — I2510 Atherosclerotic heart disease of native coronary artery without angina pectoris: Secondary | ICD-10-CM

## 2013-11-01 DIAGNOSIS — I4891 Unspecified atrial fibrillation: Secondary | ICD-10-CM

## 2013-11-01 DIAGNOSIS — I255 Ischemic cardiomyopathy: Secondary | ICD-10-CM

## 2013-11-01 DIAGNOSIS — R55 Syncope and collapse: Secondary | ICD-10-CM

## 2013-11-01 DIAGNOSIS — E782 Mixed hyperlipidemia: Secondary | ICD-10-CM

## 2013-11-01 DIAGNOSIS — I5022 Chronic systolic (congestive) heart failure: Secondary | ICD-10-CM

## 2013-11-01 NOTE — Assessment & Plan Note (Signed)
Continue statin. 

## 2013-11-01 NOTE — Assessment & Plan Note (Signed)
Continue aspirin and statin. 

## 2013-11-01 NOTE — Progress Notes (Signed)
HPI: FU coronary artery disease status post coronary bypass graft, ischemic cardiomyopathy and hyperlipidemia. Last Myoview was performed in October 2014. Ejection fraction 28%. Prior anterior and inferior infarct. No ischemia. Patient had CRT upgrade in January 2014. Echocardiogram in August of 2014 showed an ejection fraction of 0000000, grade 2 diastolic dysfunction, mild left atrial enlargement, mild aortic and mitral regurgitation. Carotid Dopplers in May 2014 showed no significant stenosis. Patient has had recurrent syncope felt possibly cough mediated. His pacemaker was interrogated and showed no significant arrhythmias. At last office visit I discontinued his Coumadin because of increased risk with recurrent syncope. I increased his Lasix and decreased his Coreg. Since that time he denies dyspnea. There is no orthopnea and his cough was lying flat has improved. He has occasional dizziness with standing but has not had recurrent syncope.   Current Outpatient Prescriptions  Medication Sig Dispense Refill  . acetaminophen (TYLENOL) 500 MG tablet Take 500 mg by mouth every 6 (six) hours as needed. For pain      . albuterol (PROVENTIL HFA;VENTOLIN HFA) 108 (90 BASE) MCG/ACT inhaler Inhale 2 puffs into the lungs every 4 (four) hours as needed for wheezing.  1 Inhaler  1  . allopurinol (ZYLOPRIM) 100 MG tablet Take 100 mg by mouth daily.        Marland Kitchen aspirin (BAYER ASPIRIN) 325 MG tablet Take 1 tablet (325 mg total) by mouth daily.  30 tablet  6  . calcium carbonate (OS-CAL) 600 MG TABS Take 600 mg by mouth daily.        . carvedilol (COREG) 6.25 MG tablet Take 1 tablet (6.25 mg total) by mouth 2 (two) times daily.  60 tablet  6  . COLCRYS 0.6 MG tablet Take 0.6 mg by mouth 2 (two) times daily as needed. For gout flares      . famotidine (PEPCID) 10 MG tablet Take 10 mg by mouth daily.       . flunisolide (NASALIDE) 25 MCG/ACT (0.025%) SOLN Inhale 2 sprays into the lungs 2 (two) times daily.        Marland Kitchen FLUoxetine (PROZAC) 40 MG capsule Take 40 mg by mouth daily.      . folic acid (FOLVITE) A999333 MCG tablet Take 400 mcg by mouth daily.        . furosemide (LASIX) 80 MG tablet Take 1 tablet (80 mg total) by mouth 2 (two) times daily.  60 tablet  6  . guaiFENesin-codeine 100-10 MG/5ML syrup Take 5 mLs by mouth 3 (three) times daily as needed for cough.  120 mL  0  . insulin glargine (LANTUS) 100 UNIT/ML injection Inject 84 Units into the skin at bedtime. Sliding scale, 70-90      . loratadine (CLARITIN) 10 MG tablet Take 10 mg by mouth daily as needed. For allergies      . Multiple Vitamin (MULTIVITAMIN) tablet Take 1 tablet by mouth daily.       Marland Kitchen omega-3 acid ethyl esters (LOVAZA) 1 G capsule Take 2 g by mouth 2 (two) times daily.        . potassium chloride SA (K-DUR,KLOR-CON) 20 MEQ tablet Take 1 tablet (20 mEq total) by mouth daily.  90 tablet  3  . pravastatin (PRAVACHOL) 80 MG tablet Take 1 tablet (80 mg total) by mouth every evening.  90 tablet  3  . ramipril (ALTACE) 2.5 MG capsule Take 1 capsule (2.5 mg total) by mouth daily.  90 capsule  3  .  spironolactone (ALDACTONE) 25 MG tablet Take 25 mg by mouth daily.         No current facility-administered medications for this visit.     Past Medical History  Diagnosis Date  . Gout   . Ventricular tachycardia   . Ischemic heart disease   . Congestive heart failure   . Left bundle branch block   . Hyperlipidemia   . Pacemaker   . ICD (implantable cardiac defibrillator) in place   . Hypertension   . Heart murmur   . Anginal pain   . Myocardial infarction 2000; 2002; 2004; ~ 2006  . Pneumonia     "once, I think" (12/16/2012)  . Diabetes mellitus type II   . Arthritis     "feet, arms, hands" (12/16/2012)  . Gout     Past Surgical History  Procedure Laterality Date  . Coronary artery bypass graft  2000    CABG X4  . Urachal cyst excision  ~ 1995  . Vasectomy  ?1967  . A-v cardiac pacemaker insertion  2002; 2010  . Insert /  replace / remove pacemaker  12/16/2012    LV lead placement; ICD assessment   . Tonsillectomy and adenoidectomy  1954  . Cardiac catheterization      "3 or 4; never had interventions" (12/16/2012)    History   Social History  . Marital Status: Married    Spouse Name: N/A    Number of Children: N/A  . Years of Education: N/A   Occupational History  . Not on file.   Social History Main Topics  . Smoking status: Never Smoker   . Smokeless tobacco: Former Systems developer    Types: Snuff, Chew     Comment: 12/16/2012 "stopped chew/snuff in ~ 1992 after 15 yr"  . Alcohol Use: 4.2 oz/week    7 Shots of liquor per week     Comment: one shot a day  . Drug Use: No     Comment: none  . Sexual Activity: Yes   Other Topics Concern  . Not on file   Social History Narrative  . No narrative on file    ROS: no fevers or chills, productive cough, hemoptysis, dysphasia, odynophagia, melena, hematochezia, dysuria, hematuria, rash, seizure activity, orthopnea, PND, pedal edema, claudication. Remaining systems are negative.  Physical Exam: Well-developed well-nourished in no acute distress.  Skin is warm and dry.  HEENT is normal.  Neck is supple.  Chest is clear to auscultation with normal expansion.  Cardiovascular exam is regular rate and rhythm.  Abdominal exam nontender or distended. No masses palpated. Extremities show no edema. neuro grossly intact

## 2013-11-01 NOTE — Addendum Note (Signed)
Addended by: Dawood Spitler, Dola Factor D on: 11/01/2013 11:20 AM   Modules accepted: Orders

## 2013-11-01 NOTE — Assessment & Plan Note (Signed)
Plan to continue aspirin. Coumadin was discontinued previously because of syncope. When he returns in 3 months we will revisit this if he has had no recurrent syncopal episodes. Continue beta blocker.

## 2013-11-01 NOTE — Assessment & Plan Note (Signed)
Management per electrophysiology. 

## 2013-11-01 NOTE — Assessment & Plan Note (Signed)
Continue present dose of Lasix. 

## 2013-11-01 NOTE — Patient Instructions (Signed)
Your physician recommends that you schedule a follow-up appointment in: 3 MONTHS WITH DR CRENSHAW  

## 2013-11-01 NOTE — Assessment & Plan Note (Signed)
Felt possibly orthostatic mediated. His symptoms have improved with reduced dose of carvedilol. Continue to monitor.

## 2013-11-01 NOTE — Assessment & Plan Note (Signed)
Continue ACE inhibitor and beta blocker. 

## 2013-11-01 NOTE — Assessment & Plan Note (Signed)
Blood pressure controlled. Continue present medications. 

## 2013-11-09 ENCOUNTER — Telehealth: Payer: Self-pay | Admitting: Cardiology

## 2013-11-09 NOTE — Telephone Encounter (Signed)
Refill    Pt need ALLOPURIMOL 100 mg &  LANOXIN .125 mg  Called in to [facility] in [place] please.

## 2013-11-10 ENCOUNTER — Other Ambulatory Visit: Payer: Self-pay

## 2013-11-10 MED ORDER — ALLOPURINOL 100 MG PO TABS: 100.0000 mg | ORAL_TABLET | Freq: Every day | ORAL | Status: DC

## 2013-11-15 ENCOUNTER — Encounter: Payer: Self-pay | Admitting: Cardiology

## 2013-11-24 ENCOUNTER — Telehealth: Payer: Self-pay

## 2013-11-24 NOTE — Telephone Encounter (Addendum)
Left message for call back  identifiable  Medication List and allergies:  Reviewed and updated  90 day supply/mail order: na Local prescriptions: [facility]   Immunizations due: Tdap patient aware  A/P:   No changes to FH or PSH or personal hx Tdap--due Flu vaccine--10/2013 [facility]--05/2010 [facility]--[facility]--none noted  To Discuss with Provider: Not at this time

## 2013-11-25 ENCOUNTER — Ambulatory Visit (INDEPENDENT_AMBULATORY_CARE_PROVIDER_SITE_OTHER): Payer: Medicare Other | Admitting: Internal Medicine

## 2013-11-25 ENCOUNTER — Encounter: Payer: Self-pay | Admitting: Internal Medicine

## 2013-11-25 VITALS — BP 114/76 | HR 78 | Temp 98.3°F | Ht 66.0 in | Wt 186.6 lb

## 2013-11-25 DIAGNOSIS — F329 Major depressive disorder, single episode, unspecified: Secondary | ICD-10-CM

## 2013-11-25 DIAGNOSIS — Z0000 Encounter for general adult medical examination without abnormal findings: Secondary | ICD-10-CM

## 2013-11-25 DIAGNOSIS — E119 Type 2 diabetes mellitus without complications: Secondary | ICD-10-CM

## 2013-11-25 DIAGNOSIS — R55 Syncope and collapse: Secondary | ICD-10-CM

## 2013-11-25 DIAGNOSIS — Z23 Encounter for immunization: Secondary | ICD-10-CM

## 2013-11-25 NOTE — Patient Instructions (Signed)
I recommend an  Endocrinology  consultation to determine optimal insulin  therapy; please inform me if you have a physician preference.

## 2013-11-25 NOTE — Addendum Note (Signed)
Addended by: Harl Bowie on: 11/25/2013 04:12 PM   Modules accepted: Orders

## 2013-11-25 NOTE — Progress Notes (Signed)
Subjective:    Patient ID: Mason Arnold, male    DOB: 08/28/1941, 72 y.o.   MRN: [id number]  HPI  Medicare Wellness Visit: Psychosocial and medical history were reviewed as required by Medicare (history related to abuse, antisocial behavior , firearm risk). Social history: Caffeine: 3 cups coffee/ day , Alcohol: 7 drinks / week , Tobacco [id number] Exercise:decreased Personal safety/fall risk:no Limitations of activities of daily living: Seatbelt/ smoke alarm use:yes Healthcare Power of Attorney/Living Will status: in place Ophthalmologic exam status:due Hearing evaluation status: not current Orientation: Oriented X 3 Memory and recall: good Spelling  testing: good Depression/anxiety assessment: controlled with Prozac Foreign travel [place] Immunization status for influenza/pneumonia/ shingles /tetanus: tetanus today Transfusion history:no Preventive health care maintenance status: Colonoscopy as per protocol/standard care:current Dental care:every 6 mos Chart reviewed and updated. Active issues reviewed and addressed as documented below.    Review of Systems He is an insulin-dependent diabetic who is followed at the [facility]. He is on 90-95 units of Lantus a day.Heis followed by Doctors Morris and Sherral Hammers. He does not carb count but "thinks back" as to what he had eaten that day before adjusting the Lantus dose. He had been offered preprandial insulin regimen but declined. A1c 6.8% in 8/14.  He's had hypoglycemic spells twice the last 2 weeks ;sugar has been as low as 62. He states his fasting blood sugars range 122-130. He is not checking postprandial sugars.  He has had syncope on 3-5 occasions. They state they do not believe it's related to low blood sugar as checks his glucose after each episode of syncope has been over 100.  He is on spironolactone , an ACE inhibitor in addition to potassium supplement. On 10/29 his potassium was 4.5  creatinine 1.4.     Objective:   Physical Exam  Gen.: Healthy and well-nourished in appearance. Alert, appropriate and cooperative throughout exam.Appears younger than stated age  Head: Normocephalic without obvious abnormalities  Eyes: No corneal or conjunctival inflammation noted. Pupils equal round reactive to light and accommodation. Extraocular motion intact.  Vision grossly normal with lenses Ears: External  ear exam reveals no significant lesions or deformities. Canals clear .TMs normal. Hearing is grossly decreased on L Nose: External nasal exam reveals no deformity or inflammation. Nasal mucosa are pink and moist. No lesions or exudates noted.  Mouth: Oral mucosa and oropharynx reveal no lesions or exudates. Teeth in good repair. Neck: No deformities, masses, or tenderness noted. Range of motion & Thyroid normal Lungs: Normal respiratory effort; chest expands symmetrically. Lungs are clear to auscultation without rales, wheezes, or increased work of breathing. Chest: slight gynecomastia Heart: Normal rate and rhythm. Normal S1 and S2. No gallop, click, or rub.No murmur. Abdomen: Bowel sounds normal; abdomen soft and nontender. No masses, organomegaly or hernias noted. Genitalia: done @ [place]; PSA normal                               Musculoskeletal/extremities: No deformity or scoliosis noted of  the thoracic or lumbar spine.   No clubbing, cyanosis, edema, or significant extremity  deformity noted. Range of motion normal .Tone & strength normal. Hand joints reveal isolated DIP changes . Fingernail  health good. Able to lie down & sit up w/o help. Negative SLR bilaterally Vascular: Carotid, radial artery, dorsalis pedis and  posterior tibial pulses are full and equal. No bruits present. Neurologic: Alert and oriented x3. Deep tendon reflexes  symmetrical and normal.  [facility]    Skin: Intact without suspicious lesions or rashes. Lymph: No cervical, axillary  lymphadenopathy present. Psych: Mood and affect are normal. Normally interactive                                                                                        Assessment & Plan:  #1 Medicare Wellness Exam; criteria met ; data entered #2 Problem List/Diagnoses reviewed #3 recurrent syncope #4 IDDM with rare hypoglycemia,only on Lantus Plan:  Assessments made/ Orders entered

## 2013-12-10 ENCOUNTER — Other Ambulatory Visit: Payer: Self-pay | Admitting: Internal Medicine

## 2013-12-20 ENCOUNTER — Other Ambulatory Visit: Payer: Self-pay | Admitting: Internal Medicine

## 2013-12-21 NOTE — Telephone Encounter (Signed)
Does Dr Caryl Comes refill this for this patient? Please advise. Thanks, MI

## 2013-12-28 NOTE — Telephone Encounter (Signed)
Pt needs to refer to ordering provider - this is not Dr. Caryl Comes.

## 2014-01-03 ENCOUNTER — Encounter: Payer: Self-pay | Admitting: Internal Medicine

## 2014-01-03 ENCOUNTER — Ambulatory Visit (INDEPENDENT_AMBULATORY_CARE_PROVIDER_SITE_OTHER): Payer: Medicare Other | Admitting: *Deleted

## 2014-01-03 DIAGNOSIS — I4729 Other ventricular tachycardia: Secondary | ICD-10-CM

## 2014-01-03 DIAGNOSIS — I4891 Unspecified atrial fibrillation: Secondary | ICD-10-CM

## 2014-01-03 DIAGNOSIS — I472 Ventricular tachycardia, unspecified: Secondary | ICD-10-CM

## 2014-01-03 DIAGNOSIS — I5022 Chronic systolic (congestive) heart failure: Secondary | ICD-10-CM

## 2014-01-03 LAB — MDC_IDC_ENUM_SESS_TYPE_REMOTE
Battery Remaining Percentage: 50 %
Battery Voltage: 2.86 V
Date Time Interrogation Session: 20150127070015
HighPow Impedance: 41 Ohm
Implantable Pulse Generator Serial Number: 733092
Lead Channel Impedance Value: 440 Ohm
Lead Channel Impedance Value: 440 Ohm
Lead Channel Pacing Threshold Amplitude: 0.75 V
Lead Channel Pacing Threshold Amplitude: 1.125 V
Lead Channel Pacing Threshold Pulse Width: 0.4 ms
Lead Channel Sensing Intrinsic Amplitude: 3.5 mV
Lead Channel Setting Pacing Amplitude: 2.125
Lead Channel Setting Pacing Amplitude: 3 V
Lead Channel Setting Pacing Pulse Width: 0.8 ms
Lead Channel Setting Sensing Sensitivity: 0.5 mV
MDC IDC MSMT BATTERY REMAINING LONGEVITY: 32 mo
MDC IDC MSMT LEADCHNL LV PACING THRESHOLD PULSEWIDTH: 0.5 ms
MDC IDC MSMT LEADCHNL RV IMPEDANCE VALUE: 490 Ohm
MDC IDC MSMT LEADCHNL RV PACING THRESHOLD AMPLITUDE: 1.5 V
MDC IDC MSMT LEADCHNL RV PACING THRESHOLD PULSEWIDTH: 0.8 ms
MDC IDC MSMT LEADCHNL RV SENSING INTR AMPL: 12 mV
MDC IDC SET LEADCHNL LV PACING PULSEWIDTH: 0.5 ms
MDC IDC SET LEADCHNL RA PACING AMPLITUDE: 2 V
MDC IDC SET ZONE DETECTION INTERVAL: 285 ms
MDC IDC STAT BRADY AP VP PERCENT: 95 %
MDC IDC STAT BRADY AP VS PERCENT: 1 %
MDC IDC STAT BRADY AS VP PERCENT: 3.3 %
MDC IDC STAT BRADY AS VS PERCENT: 1 %
MDC IDC STAT BRADY RA PERCENT PACED: 95 %
Zone Setting Detection Interval: 250 ms
Zone Setting Detection Interval: 375 ms

## 2014-01-17 ENCOUNTER — Encounter: Payer: Self-pay | Admitting: *Deleted

## 2014-02-06 ENCOUNTER — Encounter: Payer: Self-pay | Admitting: Cardiology

## 2014-02-06 ENCOUNTER — Ambulatory Visit (INDEPENDENT_AMBULATORY_CARE_PROVIDER_SITE_OTHER): Payer: Medicare Other | Admitting: Cardiology

## 2014-02-06 VITALS — BP 118/82 | HR 60 | Ht 66.0 in | Wt 180.8 lb

## 2014-02-06 DIAGNOSIS — I4891 Unspecified atrial fibrillation: Secondary | ICD-10-CM

## 2014-02-06 DIAGNOSIS — I2510 Atherosclerotic heart disease of native coronary artery without angina pectoris: Secondary | ICD-10-CM

## 2014-02-06 DIAGNOSIS — R0989 Other specified symptoms and signs involving the circulatory and respiratory systems: Secondary | ICD-10-CM

## 2014-02-06 DIAGNOSIS — R0609 Other forms of dyspnea: Secondary | ICD-10-CM

## 2014-02-06 DIAGNOSIS — R55 Syncope and collapse: Secondary | ICD-10-CM

## 2014-02-06 MED ORDER — ALBUTEROL SULFATE HFA 108 (90 BASE) MCG/ACT IN AERS: 2.0000 | INHALATION_SPRAY | RESPIRATORY_TRACT | Status: DC | PRN

## 2014-02-06 MED ORDER — CARVEDILOL 3.125 MG PO TABS: 3.1250 mg | ORAL_TABLET | Freq: Two times a day (BID) | ORAL | Status: DC

## 2014-02-06 NOTE — Progress Notes (Signed)
HPI: FU coronary artery disease status post coronary bypass graft, ischemic cardiomyopathy and hyperlipidemia. Last Myoview was performed in October 2014. Ejection fraction 21%. Prior anterior and inferior infarct. No ischemia. Patient had CRT upgrade in January 2014. Echocardiogram in August of 2014 showed an ejection fraction of 0000000, grade 2 diastolic dysfunction, mild left atrial enlargement, mild aortic and mitral regurgitation. Carotid Dopplers in May 2014 showed no significant stenosis. Patient has had recurrent syncope felt possibly cough mediated. His pacemaker was interrogated and showed no significant arrhythmias. I last saw him in Nov 2014. Since then, he has some dyspnea on exertion relieved with bronchodilators. Mild orthopnea. No pedal edema. No exertional chest pain. He had a syncopal episode associated with coughing. He does have dizziness with standing.   Current Outpatient Prescriptions  Medication Sig Dispense Refill  . acetaminophen (TYLENOL) 500 MG tablet Take 500 mg by mouth every 6 (six) hours as needed. For pain      . albuterol (PROVENTIL HFA;VENTOLIN HFA) 108 (90 BASE) MCG/ACT inhaler Inhale 2 puffs into the lungs every 4 (four) hours as needed for wheezing.  1 Inhaler  1  . allopurinol (ZYLOPRIM) 100 MG tablet Take 1 tablet (100 mg total) by mouth daily.  30 tablet  6  . aspirin (BAYER ASPIRIN) 325 MG tablet Take 1 tablet (325 mg total) by mouth daily.  30 tablet  6  . calcium carbonate (OS-CAL) 600 MG TABS Take 600 mg by mouth daily.        . carvedilol (COREG) 6.25 MG tablet Take 1 tablet (6.25 mg total) by mouth 2 (two) times daily.  60 tablet  6  . famotidine (PEPCID) 10 MG tablet Take 10 mg by mouth daily.       Marland Kitchen FLUoxetine (PROZAC) 20 MG capsule Take 20 mg by mouth daily.      . folic acid (FOLVITE) A999333 MCG tablet Take 400 mcg by mouth daily.        . furosemide (LASIX) 80 MG tablet Take 1 tablet (80 mg total) by mouth 2 (two) times daily.  60 tablet  6    . insulin glargine (LANTUS) 100 UNIT/ML injection Inject 84 Units into the skin at bedtime. Sliding scale, 70-90      . loratadine (CLARITIN) 10 MG tablet Take 10 mg by mouth daily as needed. For allergies      . Multiple Vitamin (MULTIVITAMIN) tablet Take 1 tablet by mouth daily.       Marland Kitchen omega-3 acid ethyl esters (LOVAZA) 1 G capsule Take 2 g by mouth 2 (two) times daily.        . potassium chloride SA (K-DUR,KLOR-CON) 20 MEQ tablet Take 1 tablet (20 mEq total) by mouth daily.  90 tablet  3  . pravastatin (PRAVACHOL) 80 MG tablet Take 1 tablet (80 mg total) by mouth every evening.  90 tablet  3  . ramipril (ALTACE) 2.5 MG capsule TAKE 1 CAPSULE BY MOUTH ONCE A DAY  90 capsule  0  . spironolactone (ALDACTONE) 25 MG tablet Take 25 mg by mouth daily.         No current facility-administered medications for this visit.     Past Medical History  Diagnosis Date  . Gout   . Ventricular tachycardia   . Ischemic heart disease   . Congestive heart failure   . Left bundle branch block   . Hyperlipidemia   . Pacemaker   . ICD (implantable cardiac defibrillator) in place   .  Hypertension   . Heart murmur   . Anginal pain   . Myocardial infarction 2000; 2002; 2004; ~ 2006  . Pneumonia     "once, I think" (12/16/2012)  . Diabetes mellitus type II     IDDM , [facility]  . Gout     roast beef is a trigger    Past Surgical History  Procedure Laterality Date  . Coronary artery bypass graft  2000    CABG X4  . Urachal cyst excision  ~ 1995  . Vasectomy  ?1967  . A-v cardiac pacemaker insertion  2002; 2010  . Insert / replace / remove pacemaker  12/16/2012    LV lead placement; ICD assessment   . Tonsillectomy and adenoidectomy  1954  . Cardiac catheterization      "3 or 4; never had interventions" (12/16/2012)    History   Social History  . Marital Status: Married    Spouse Name: N/A    Number of Children: N/A  . Years of Education: N/A   Occupational History  . Not on file.   Social  History Main Topics  . Smoking status: Never Smoker   . Smokeless tobacco: Former Systems developer    Types: Snuff, Chew     Comment: 12/16/2012 "stopped chew/snuff in ~ 1992 after 15 yr"  . Alcohol Use: 4.2 oz/week    7 Shots of liquor per week     Comment: one shot a day  . Drug Use: No     Comment: none  . Sexual Activity: Yes   Other Topics Concern  . Not on file   Social History Narrative  . No narrative on file    ROS: no fevers or chills, productive cough, hemoptysis, dysphasia, odynophagia, melena, hematochezia, dysuria, hematuria, rash, seizure activity, orthopnea, PND, pedal edema, claudication. Remaining systems are negative.  Physical Exam: Well-developed well-nourished in no acute distress.  Skin is warm and dry.  HEENT is normal.  Neck is supple.  Chest is clear to auscultation with normal expansion.  Cardiovascular exam is regular rate and rhythm.  Abdominal exam nontender or distended. No masses palpated. Extremities show no edema. neuro grossly intact  ECG AV paced.

## 2014-02-06 NOTE — Assessment & Plan Note (Signed)
Patient remains in atrial paced rhythm. Continue low-dose beta blocker. Continue aspirin. Given recurrence syncope I am hesitant to begin anticoagulation at this point. We will consider in the future if he improves from this standpoint. He also does not want Coumadin at present.

## 2014-02-06 NOTE — Patient Instructions (Signed)
Your physician recommends that you schedule a follow-up appointment in: [place]  Your physician recommends that you HAVE LAB WORK TODAY  DECREASE CARVEDILOL TO 3.125 MG TWICE DAILY

## 2014-02-06 NOTE — Assessment & Plan Note (Signed)
Continue ACE inhibitor and beta blocker. 

## 2014-02-06 NOTE — Assessment & Plan Note (Signed)
Continue aspirin and statin. 

## 2014-02-06 NOTE — Assessment & Plan Note (Signed)
He had a another syncopal episode. He has significant orthostatic symptoms. I would be hesitant to decrease his diuretics as he becomes significantly dyspneic with volume excess. I will decrease carvedilol to 3.125 mg by mouth twice a day. Hopefully allow his blood pressure to run higher will help with his orthostatic symptoms. If not we could consider discontinuing Coreg and ACE inhibitor.

## 2014-02-06 NOTE — Assessment & Plan Note (Signed)
He appears to be euvolemic on examination. Continue present dose of diuretics. He does have some dyspnea on exertion. This improves with bronchodilators. I will check potassium, renal function and BNP.

## 2014-02-06 NOTE — Assessment & Plan Note (Signed)
Followed by EP 

## 2014-02-06 NOTE — Assessment & Plan Note (Signed)
Continue statin. 

## 2014-02-07 ENCOUNTER — Other Ambulatory Visit: Payer: Self-pay | Admitting: *Deleted

## 2014-02-07 DIAGNOSIS — I4891 Unspecified atrial fibrillation: Secondary | ICD-10-CM

## 2014-02-07 DIAGNOSIS — R55 Syncope and collapse: Secondary | ICD-10-CM

## 2014-02-07 LAB — BASIC METABOLIC PANEL
BUN: 26 mg/dL — AB (ref 6–23)
CHLORIDE: 95 meq/L — AB (ref 96–112)
CO2: 31 meq/L (ref 19–32)
Calcium: 9.7 mg/dL (ref 8.4–10.5)
Creatinine, Ser: 1.6 mg/dL — ABNORMAL HIGH (ref 0.4–1.5)
GFR: 44.8 mL/min — ABNORMAL LOW (ref >60.00)
Glucose, Bld: 116 mg/dL — ABNORMAL HIGH (ref 70–99)
Potassium: 4 mEq/L (ref 3.5–5.1)
Sodium: 134 mEq/L — ABNORMAL LOW (ref 135–145)

## 2014-02-07 LAB — BRAIN NATRIURETIC PEPTIDE: Pro B Natriuretic peptide (BNP): 299 pg/mL — ABNORMAL HIGH (ref 0.0–100.0)

## 2014-02-22 ENCOUNTER — Other Ambulatory Visit (INDEPENDENT_AMBULATORY_CARE_PROVIDER_SITE_OTHER): Payer: Medicare Other

## 2014-02-22 DIAGNOSIS — I4891 Unspecified atrial fibrillation: Secondary | ICD-10-CM

## 2014-02-22 DIAGNOSIS — R55 Syncope and collapse: Secondary | ICD-10-CM

## 2014-02-22 LAB — BASIC METABOLIC PANEL
BUN: 25 mg/dL — AB (ref 6–23)
CHLORIDE: 94 meq/L — AB (ref 96–112)
CO2: 33 meq/L — AB (ref 19–32)
CREATININE: 1.3 mg/dL (ref 0.4–1.5)
Calcium: 9.2 mg/dL (ref 8.4–10.5)
GFR: 55.37 mL/min — ABNORMAL LOW (ref >60.00)
Glucose, Bld: 200 mg/dL — ABNORMAL HIGH (ref 70–99)
POTASSIUM: 4.4 meq/L (ref 3.5–5.1)
Sodium: 134 mEq/L — ABNORMAL LOW (ref 135–145)

## 2014-03-08 ENCOUNTER — Other Ambulatory Visit: Payer: Self-pay | Admitting: Internal Medicine

## 2014-03-16 ENCOUNTER — Telehealth: Payer: Self-pay | Admitting: Cardiology

## 2014-03-16 NOTE — Telephone Encounter (Signed)
Spoke with patient and his wife.   Increased SOB X 4-5 days. Dry cough, sleeping on 3 pillows instead of 2. Weight usually 173 lb.  Was 175 lb on Fri, currently 174 lb.  Feels tight in abdomen. Per understanding with Dr. Stanford Breed patient takes additional 40 mg lasix prn. Instructed pt to take the additional 40 mg lasix today. Patient does also report wheezing for which he uses albuterol bid, prn and a temp of 99 F two days ago. Instructed patient that if after taking extra lasix if wheezing gets worse or his temp increases--call primary care for advice/appointment. Patient verbalizes understanding.

## 2014-03-16 NOTE — Telephone Encounter (Signed)
New problem   Pt is having SOB x 4 days per wife calling.

## 2014-03-20 ENCOUNTER — Ambulatory Visit (INDEPENDENT_AMBULATORY_CARE_PROVIDER_SITE_OTHER)
Admission: RE | Admit: 2014-03-20 | Discharge: 2014-03-20 | Disposition: A | Discharge status: Home / Self Care | Payer: Medicare Other | Source: Ambulatory Visit | Attending: Internal Medicine | Admitting: Internal Medicine

## 2014-03-20 ENCOUNTER — Ambulatory Visit (INDEPENDENT_AMBULATORY_CARE_PROVIDER_SITE_OTHER): Payer: Medicare Other | Admitting: Internal Medicine

## 2014-03-20 ENCOUNTER — Encounter: Payer: Self-pay | Admitting: Internal Medicine

## 2014-03-20 ENCOUNTER — Other Ambulatory Visit (INDEPENDENT_AMBULATORY_CARE_PROVIDER_SITE_OTHER): Payer: Medicare Other

## 2014-03-20 VITALS — BP 130/72 | HR 82 | Temp 97.9°F | Wt 176.2 lb

## 2014-03-20 DIAGNOSIS — R059 Cough, unspecified: Secondary | ICD-10-CM

## 2014-03-20 DIAGNOSIS — R0600 Dyspnea, unspecified: Secondary | ICD-10-CM

## 2014-03-20 DIAGNOSIS — R05 Cough: Secondary | ICD-10-CM

## 2014-03-20 DIAGNOSIS — R404 Transient alteration of awareness: Secondary | ICD-10-CM

## 2014-03-20 DIAGNOSIS — R0989 Other specified symptoms and signs involving the circulatory and respiratory systems: Secondary | ICD-10-CM

## 2014-03-20 DIAGNOSIS — R0609 Other forms of dyspnea: Secondary | ICD-10-CM

## 2014-03-20 DIAGNOSIS — R4189 Other symptoms and signs involving cognitive functions and awareness: Secondary | ICD-10-CM

## 2014-03-20 LAB — BRAIN NATRIURETIC PEPTIDE: Pro B Natriuretic peptide (BNP): 575 pg/mL — ABNORMAL HIGH (ref 0.0–100.0)

## 2014-03-20 NOTE — Patient Instructions (Signed)
Your next office appointment will be determined based upon review of your pending labs, pulmonary function tests & x-rays. Those instructions will be transmitted to you through My Chart  .

## 2014-03-20 NOTE — Progress Notes (Signed)
   Subjective:    Patient ID: Mason Arnold, male    DOB: [birth year], 73 y.o.   MRN: [id number]  HPI   He's had a cough for approximately 2-3 months. He has paroxysms of cough lasting 2-3 seconds. The symptoms have worsened during that period time. There was no exposure to any ill individuals.  It is a nonproductive cough associated with wheezing.  He has had nasal and chest secretions; these are described as emanating from  the back of the throat.  Triggers for cough have included pollen, dust, chemicals, fumes, and secondhand smoke.  An albuterol meter dose inhaler has been of some benefit. He is also using OTC meds and nasal spray. Increasing Furosemide to 120 mg as per Dr Stanford Breed also helped.  He's had some low-grade fever. He's also describes some intermittent discomfort with deep breathing. He has associated dyspnea with wheezing.  He sleeps with 2 pillows but still describes paroxysmal nocturnal dyspnea. His most recent ejection fraction was 20%  He has never smoked. History of asthma as a child which resolved. He is on a low-dose ACE inhibitor.   Review of Systems   He does not have associated itchy, watery eyes. He also denies discolored nasal secretions. He has no frontal or facial sinus pain.  There's been no associated hemoptysis.  Reflux is not significant.  He continues to have episodes of loss of responsiveness which last 30 seconds or less. He has had at least 9 episodes since the first of the month;3  today. Cardiac monitor being conducted He is on 35 units of Lantus insulin. His most recent A1c was 6.8% his diabetes is monitored by [place].     Objective:   Physical Exam General appearance:good health ;well nourished; no acute distress or increased work of breathing is present.  No  lymphadenopathy about the head, neck, or axilla noted. Appears younger than stated age  Eyes: No conjunctival inflammation or lid edema is present. There is no scleral  icterus.  Ears:  External ear exam shows no significant lesions or deformities.  Otoscopic examination reveals clear canals, tympanic membranes are intact bilaterally without bulging, retraction, inflammation or discharge.  Nose:  External nasal examination shows no deformity or inflammation. Nasal mucosa are pink and moist without lesions or exudates. No septal dislocation or deviation.No obstruction to airflow.   Oral exam: Dental hygiene is good; lips and gums are healthy appearing.There is no oropharyngeal erythema or exudate noted.   Neck:  No deformities, thyromegaly, masses, or tenderness noted.   Supple with full range of motion without pain. Neck vein distention to mandible at 10  Heart:  Normal rate and regular rhythm. S1 and S2 normal without gallop,  click, rub or other extra sounds. Grade 1 systolic murmur  Lungs:Chest clear to auscultation; no wheezes, rhonchi,rales ,or rubs present.No increased work of breathing.    Extremities:  No cyanosis, edema, or clubbing  noted   Abdomen: Without organomegaly or masses. Dullness to percussion in the upper quadrants. Hepatojugular reflux suggested  Pulses are intact; dorsalis pedis pulses are slightly decreased. He has no cyanosis, clubbing, or edema.   Skin: Warm & dry w/o jaundice or tenting.         Assessment & Plan:  #1 cough  #2 PND #3 paroxysmal "attacks"with decreased responsiveness w/o seizure activity See plan

## 2014-03-20 NOTE — Progress Notes (Signed)
Pre visit review using our clinic review tool, if applicable. No additional management support is needed unless otherwise documented below in the visit note. 

## 2014-03-22 ENCOUNTER — Other Ambulatory Visit: Payer: Self-pay | Admitting: Internal Medicine

## 2014-03-22 DIAGNOSIS — I5022 Chronic systolic (congestive) heart failure: Secondary | ICD-10-CM

## 2014-03-22 DIAGNOSIS — I255 Ischemic cardiomyopathy: Secondary | ICD-10-CM

## 2014-03-22 DIAGNOSIS — I509 Heart failure, unspecified: Secondary | ICD-10-CM

## 2014-03-27 ENCOUNTER — Ambulatory Visit (INDEPENDENT_AMBULATORY_CARE_PROVIDER_SITE_OTHER): Payer: Medicare Other | Admitting: Cardiology

## 2014-03-27 ENCOUNTER — Encounter: Payer: Self-pay | Admitting: Cardiology

## 2014-03-27 VITALS — BP 106/72 | HR 60 | Ht 65.0 in | Wt 170.1 lb

## 2014-03-27 DIAGNOSIS — Z95810 Presence of automatic (implantable) cardiac defibrillator: Secondary | ICD-10-CM

## 2014-03-27 DIAGNOSIS — I255 Ischemic cardiomyopathy: Secondary | ICD-10-CM

## 2014-03-27 DIAGNOSIS — E782 Mixed hyperlipidemia: Secondary | ICD-10-CM

## 2014-03-27 DIAGNOSIS — I4891 Unspecified atrial fibrillation: Secondary | ICD-10-CM

## 2014-03-27 DIAGNOSIS — R55 Syncope and collapse: Secondary | ICD-10-CM

## 2014-03-27 DIAGNOSIS — I5022 Chronic systolic (congestive) heart failure: Secondary | ICD-10-CM

## 2014-03-27 DIAGNOSIS — I2589 Other forms of chronic ischemic heart disease: Secondary | ICD-10-CM

## 2014-03-27 DIAGNOSIS — I2510 Atherosclerotic heart disease of native coronary artery without angina pectoris: Secondary | ICD-10-CM

## 2014-03-27 LAB — CBC WITH DIFFERENTIAL/PLATELET
Basophils Absolute: 0.1 10*3/uL (ref 0.0–0.1)
Basophils Relative: 0.4 % (ref 0.0–3.0)
EOS PCT: 6.2 % — AB (ref 0.0–5.0)
Eosinophils Absolute: 0.9 10*3/uL — ABNORMAL HIGH (ref 0.0–0.7)
HEMATOCRIT: 41.8 % (ref 39.0–52.0)
Hemoglobin: 14 g/dL (ref 13.0–17.0)
LYMPHS ABS: 1.8 10*3/uL (ref 0.7–4.0)
Lymphocytes Relative: 12.3 % (ref 12.0–46.0)
MCHC: 33.6 g/dL (ref 30.0–36.0)
MCV: 89.5 fl (ref 78.0–100.0)
MONOS PCT: 6.3 % (ref 3.0–12.0)
Monocytes Absolute: 1 10*3/uL (ref 0.1–1.0)
NEUTROS ABS: 11.2 10*3/uL — AB (ref 1.4–7.7)
Neutrophils Relative %: 74.8 % (ref 43.0–77.0)
PLATELETS: 397 10*3/uL (ref 150.0–400.0)
RBC: 4.67 Mil/uL (ref 4.22–5.81)
RDW: 13.7 % (ref 11.5–14.6)
WBC: 15 10*3/uL — AB (ref 4.5–10.5)

## 2014-03-27 LAB — BASIC METABOLIC PANEL
BUN: 19 mg/dL (ref 6–23)
CHLORIDE: 94 meq/L — AB (ref 96–112)
CO2: 28 mEq/L (ref 19–32)
Calcium: 9.5 mg/dL (ref 8.4–10.5)
Creatinine, Ser: 1.4 mg/dL (ref 0.4–1.5)
GFR: 51.77 mL/min — ABNORMAL LOW (ref >60.00)
GLUCOSE: 134 mg/dL — AB (ref 70–99)
Potassium: 4.7 mEq/L (ref 3.5–5.1)
Sodium: 135 mEq/L (ref 135–145)

## 2014-03-27 LAB — PROTIME-INR
INR: 1.2 ratio — ABNORMAL HIGH (ref 0.8–1.0)
Prothrombin Time: 13.8 s — ABNORMAL HIGH (ref 9.6–13.1)

## 2014-03-27 NOTE — Patient Instructions (Signed)
Your physician wants you to follow-up in: 3 [place] will receive a reminder letter in the mail two months in advance. If you don't receive a letter, please call our office to schedule the follow-up appointment.   Your physician recommends that you HAVE LAB WORK TODAY  Your physician has requested that you have a cardiac catheterization. Cardiac catheterization is used to diagnose and/or treat various heart conditions. Doctors may recommend this procedure for a number of different reasons. The most common reason is to evaluate chest pain. Chest pain can be a symptom of coronary artery disease (CAD), and cardiac catheterization can show whether plaque is narrowing or blocking your heart's arteries. This procedure is also used to evaluate the valves, as well as measure the blood flow and oxygen levels in different parts of your heart. For further information please visit [email or web address]. Please follow instruction sheet, as given.

## 2014-03-27 NOTE — Assessment & Plan Note (Addendum)
Patient is having progressive congestive heart failure symptoms. He describes dyspnea on exertion, orthopnea, fatigue and decreased appetite. His symptoms were stable for years and have worsened over the past several months. I will arrange right and left heart catheterization to further evaluate with Dr. Haroldine Laws followed by a CHF clinic formal evaluation. The risks and benefits of catheterization were discussed and he agrees to proceed. Continue present dose of Lasix.

## 2014-03-27 NOTE — Assessment & Plan Note (Signed)
Continue aspirin and statin. 

## 2014-03-27 NOTE — Assessment & Plan Note (Signed)
Follow-up electrophysiology. 

## 2014-03-27 NOTE — Progress Notes (Signed)
HPI: FU coronary artery disease status post coronary bypass graft, ischemic cardiomyopathy and hyperlipidemia. Last Myoview was performed in October 2014. Ejection fraction 21%. Prior anterior and inferior infarct. No ischemia. Patient had CRT upgrade in January 2014. Echocardiogram in August of 2014 showed an ejection fraction of 0000000, grade 2 diastolic dysfunction, mild left atrial enlargement, mild aortic and mitral regurgitation. Carotid Dopplers in May 2014 showed no significant stenosis. Patient has had recurrent syncope felt possibly cough mediated. His pacemaker was interrogated and showed no significant arrhythmias. I last saw him in March 2015. Since then, He continues to have dyspnea on exertion as well as orthopnea. No pedal edema. Occasional chest pain with coughing. He also has had problems with syncope after coughing.   Current Outpatient Prescriptions  Medication Sig Dispense Refill  . acetaminophen (TYLENOL) 500 MG tablet Take 500 mg by mouth every 6 (six) hours as needed. For pain      . albuterol (PROVENTIL HFA;VENTOLIN HFA) 108 (90 BASE) MCG/ACT inhaler Inhale 2 puffs into the lungs every 4 (four) hours as needed for wheezing.  1 Inhaler  6  . allopurinol (ZYLOPRIM) 100 MG tablet Take 1 tablet (100 mg total) by mouth daily.  30 tablet  6  . aspirin (BAYER ASPIRIN) 325 MG tablet Take 1 tablet (325 mg total) by mouth daily.  30 tablet  6  . calcium carbonate (OS-CAL) 600 MG TABS Take 600 mg by mouth daily.        . carvedilol (COREG) 3.125 MG tablet Take 1 tablet (3.125 mg total) by mouth 2 (two) times daily.  180 tablet  3  . famotidine (PEPCID) 10 MG tablet Take 10 mg by mouth daily.       Marland Kitchen FLUoxetine (PROZAC) 20 MG capsule Take 20 mg by mouth daily.      . folic acid (FOLVITE) A999333 MCG tablet Take 400 mcg by mouth daily.        . furosemide (LASIX) 80 MG tablet Take 1 tablet (80 mg total) by mouth 2 (two) times daily.  60 tablet  6  . insulin glargine (LANTUS) 100  UNIT/ML injection Inject 84 Units into the skin at bedtime. Sliding scale, 70-90      . loratadine (CLARITIN) 10 MG tablet Take 10 mg by mouth daily as needed. For allergies      . Multiple Vitamin (MULTIVITAMIN) tablet Take 1 tablet by mouth daily.       Marland Kitchen omega-3 acid ethyl esters (LOVAZA) 1 G capsule Take 2 g by mouth 2 (two) times daily.        . potassium chloride SA (K-DUR,KLOR-CON) 20 MEQ tablet Take 1 tablet (20 mEq total) by mouth daily.  90 tablet  3  . pravastatin (PRAVACHOL) 80 MG tablet Take 1 tablet (80 mg total) by mouth every evening.  90 tablet  3  . ramipril (ALTACE) 2.5 MG capsule TAKE 1 CAPSULE BY MOUTH ONCE A DAY  90 capsule  0  . spironolactone (ALDACTONE) 25 MG tablet Take 25 mg by mouth daily.         No current facility-administered medications for this visit.     Past Medical History  Diagnosis Date  . Gout   . Ventricular tachycardia   . Ischemic heart disease   . Congestive heart failure   . Left bundle branch block   . Hyperlipidemia   . Pacemaker   . ICD (implantable cardiac defibrillator) in place   . Hypertension   .  Heart murmur   . Anginal pain   . Myocardial infarction 2000; 2002; 2004; ~ 2006  . Pneumonia     "once, I think" (12/16/2012)  . Diabetes mellitus type II     IDDM , VAH  . Gout     roast beef is a trigger    Past Surgical History  Procedure Laterality Date  . Coronary artery bypass graft  2000    CABG X4  . Urachal cyst excision  ~ 1995  . Vasectomy  ?1967  . A-v cardiac pacemaker insertion  2002; 2010  . Insert / replace / remove pacemaker  12/16/2012    LV lead placement; ICD assessment   . Tonsillectomy and adenoidectomy  1954  . Cardiac catheterization      "3 or 4; never had interventions" (12/16/2012)    History   Social History  . Marital Status: Married    Spouse Name: N/A    Number of Children: N/A  . Years of Education: N/A   Occupational History  . Not on file.   Social History Main Topics  . Smoking  status: Never Smoker   . Smokeless tobacco: Former Systems developer    Types: Snuff, Chew     Comment: 12/16/2012 "stopped chew/snuff in ~ 1992 after 15 yr"  . Alcohol Use: 4.2 oz/week    7 Shots of liquor per week     Comment: one shot a day  . Drug Use: No     Comment: none  . Sexual Activity: Yes   Other Topics Concern  . Not on file   Social History Narrative  . No narrative on file    ROS: no fevers or chills, productive cough, hemoptysis, dysphasia, odynophagia, melena, hematochezia, dysuria, hematuria, rash, seizure activity, orthopnea, PND, pedal edema, claudication. Remaining systems are negative.  Physical Exam: Well-developed well-nourished in no acute distress.  Skin is warm and dry.  HEENT is normal.  Neck is supple.  Chest is clear to auscultation with normal expansion. ICD left chest. Cardiovascular exam is regular rate and rhythm.  Abdominal exam nontender or distended. No masses palpated. Extremities show no edema. neuro grossly intact

## 2014-03-27 NOTE — Assessment & Plan Note (Signed)
Continue ACE inhibitor and beta blocker. I will not advance given recurrence syncope.

## 2014-03-27 NOTE — Assessment & Plan Note (Signed)
This is felt to be cough mediated.

## 2014-03-27 NOTE — Assessment & Plan Note (Signed)
Patient remains in sinus rhythm. Continue aspirin. Not a Coumadin candidate at present given recurrence syncope.

## 2014-03-27 NOTE — Assessment & Plan Note (Signed)
Continue statin. 

## 2014-03-28 ENCOUNTER — Encounter: Payer: Self-pay | Admitting: *Deleted

## 2014-03-28 ENCOUNTER — Other Ambulatory Visit: Payer: Self-pay | Admitting: Cardiology

## 2014-03-28 ENCOUNTER — Encounter (HOSPITAL_COMMUNITY): Payer: Self-pay | Admitting: Pharmacy Technician

## 2014-03-28 DIAGNOSIS — I5023 Acute on chronic systolic (congestive) heart failure: Secondary | ICD-10-CM

## 2014-03-31 ENCOUNTER — Telehealth: Payer: Self-pay | Admitting: Internal Medicine

## 2014-03-31 NOTE — Telephone Encounter (Signed)
New message     Want Dr Caryl Comes to know pt is having a cath on Tuesday.  Pt is scheduled for remote transmission from home on wed the 30th.  Do you want him to transmit early or have someone to do it while in the hosp?

## 2014-04-03 NOTE — Telephone Encounter (Signed)
Spoke with wife, and patient will send remote transmission this evening since he is having a cath done in the am.

## 2014-04-04 ENCOUNTER — Encounter (HOSPITAL_COMMUNITY)
Admission: RE | Disposition: A | Discharge status: Home / Self Care | Payer: Self-pay | Source: Ambulatory Visit | Attending: Internal Medicine

## 2014-04-04 ENCOUNTER — Ambulatory Visit (HOSPITAL_COMMUNITY)
Admission: RE | Admit: 2014-04-04 | Discharge: 2014-04-04 | Disposition: A | Discharge status: Home / Self Care | Payer: Medicare Other | Source: Ambulatory Visit | Attending: Internal Medicine | Admitting: Internal Medicine

## 2014-04-04 DIAGNOSIS — E785 Hyperlipidemia, unspecified: Secondary | ICD-10-CM | POA: Insufficient documentation

## 2014-04-04 DIAGNOSIS — I509 Heart failure, unspecified: Secondary | ICD-10-CM | POA: Insufficient documentation

## 2014-04-04 DIAGNOSIS — I447 Left bundle-branch block, unspecified: Secondary | ICD-10-CM | POA: Insufficient documentation

## 2014-04-04 DIAGNOSIS — I5023 Acute on chronic systolic (congestive) heart failure: Secondary | ICD-10-CM

## 2014-04-04 DIAGNOSIS — I080 Rheumatic disorders of both mitral and aortic valves: Secondary | ICD-10-CM | POA: Insufficient documentation

## 2014-04-04 DIAGNOSIS — I252 Old myocardial infarction: Secondary | ICD-10-CM | POA: Insufficient documentation

## 2014-04-04 DIAGNOSIS — I10 Essential (primary) hypertension: Secondary | ICD-10-CM | POA: Insufficient documentation

## 2014-04-04 DIAGNOSIS — I2589 Other forms of chronic ischemic heart disease: Secondary | ICD-10-CM | POA: Insufficient documentation

## 2014-04-04 DIAGNOSIS — R011 Cardiac murmur, unspecified: Secondary | ICD-10-CM | POA: Insufficient documentation

## 2014-04-04 DIAGNOSIS — I2510 Atherosclerotic heart disease of native coronary artery without angina pectoris: Secondary | ICD-10-CM

## 2014-04-04 DIAGNOSIS — Z794 Long term (current) use of insulin: Secondary | ICD-10-CM | POA: Insufficient documentation

## 2014-04-04 DIAGNOSIS — E119 Type 2 diabetes mellitus without complications: Secondary | ICD-10-CM | POA: Insufficient documentation

## 2014-04-04 DIAGNOSIS — M109 Gout, unspecified: Secondary | ICD-10-CM | POA: Insufficient documentation

## 2014-04-04 DIAGNOSIS — Z95810 Presence of automatic (implantable) cardiac defibrillator: Secondary | ICD-10-CM | POA: Insufficient documentation

## 2014-04-04 DIAGNOSIS — I87309 Chronic venous hypertension (idiopathic) without complications of unspecified lower extremity: Secondary | ICD-10-CM | POA: Insufficient documentation

## 2014-04-04 HISTORY — PX: LEFT AND RIGHT HEART CATHETERIZATION WITH CORONARY/GRAFT ANGIOGRAM: SHX5448

## 2014-04-04 LAB — POCT I-STAT 3, VENOUS BLOOD GAS (G3P V)
Acid-Base Excess: 1 mmol/L (ref 0.0–2.0)
Acid-Base Excess: 1 mmol/L (ref 0.0–2.0)
Bicarbonate: 26.1 mEq/L — ABNORMAL HIGH (ref 20.0–24.0)
Bicarbonate: 26.2 mEq/L — ABNORMAL HIGH (ref 20.0–24.0)
O2 SAT: 57 %
O2 Saturation: 57 %
PCO2 VEN: 40.9 mmHg — AB (ref 45.0–50.0)
PH VEN: 7.412 — AB (ref 7.250–7.300)
TCO2: 27 mmol/L (ref 0–100)
TCO2: 27 mmol/L (ref 0–100)
pCO2, Ven: 41.1 mmHg — ABNORMAL LOW (ref 45.0–50.0)
pH, Ven: 7.412 — ABNORMAL HIGH (ref 7.250–7.300)
pO2, Ven: 30 mmHg (ref 30.0–45.0)
pO2, Ven: 30 mmHg (ref 30.0–45.0)

## 2014-04-04 LAB — GLUCOSE, CAPILLARY
Glucose-Capillary: 147 mg/dL — ABNORMAL HIGH (ref 70–99)
Glucose-Capillary: 92 mg/dL (ref 70–99)

## 2014-04-04 LAB — POCT I-STAT 3, ART BLOOD GAS (G3+)
ACID-BASE EXCESS: 2 mmol/L (ref 0.0–2.0)
Bicarbonate: 25.7 mEq/L — ABNORMAL HIGH (ref 20.0–24.0)
O2 SAT: 94 %
PCO2 ART: 37.1 mmHg (ref 35.0–45.0)
PO2 ART: 67 mmHg — AB (ref 80.0–100.0)
TCO2: 27 mmol/L (ref 0–100)
pH, Arterial: 7.449 (ref 7.350–7.450)

## 2014-04-04 SURGERY — LEFT AND RIGHT HEART CATHETERIZATION WITH CORONARY/GRAFT ANGIOGRAM
Anesthesia: LOCAL

## 2014-04-04 MED ORDER — MIDAZOLAM HCL 2 MG/2ML IJ SOLN
INTRAMUSCULAR | Status: AC
  Filled 2014-04-04: qty 2

## 2014-04-04 MED ORDER — LIDOCAINE HCL (PF) 1 % IJ SOLN
INTRAMUSCULAR | Status: AC
  Filled 2014-04-04: qty 30

## 2014-04-04 MED ORDER — ASPIRIN 81 MG PO CHEW
CHEWABLE_TABLET | ORAL | Status: AC
  Administered 2014-04-04: 81 mg
  Filled 2014-04-04: qty 1

## 2014-04-04 MED ORDER — NITROGLYCERIN 0.2 MG/ML ON CALL CATH LAB
INTRAVENOUS | Status: AC
  Filled 2014-04-04: qty 1

## 2014-04-04 MED ORDER — ASPIRIN 81 MG PO CHEW: 81.0000 mg | CHEWABLE_TABLET | ORAL | Status: DC

## 2014-04-04 MED ORDER — SODIUM CHLORIDE 0.9 % IV SOLN: INTRAVENOUS | Status: AC

## 2014-04-04 MED ORDER — SODIUM CHLORIDE 0.9 % IJ SOLN: 3.0000 mL | Freq: Two times a day (BID) | INTRAMUSCULAR | Status: DC

## 2014-04-04 MED ORDER — HEPARIN (PORCINE) IN NACL 2-0.9 UNIT/ML-% IJ SOLN
INTRAMUSCULAR | Status: AC
  Filled 2014-04-04: qty 1000

## 2014-04-04 MED ORDER — SODIUM CHLORIDE 0.9 % IV SOLN: 250.0000 mL | INTRAVENOUS | Status: DC | PRN

## 2014-04-04 MED ORDER — FUROSEMIDE 10 MG/ML IJ SOLN
80.0000 mg | Freq: Once | INTRAMUSCULAR | Status: AC
  Administered 2014-04-04: 80 mg via INTRAVENOUS

## 2014-04-04 MED ORDER — FUROSEMIDE 10 MG/ML IJ SOLN
INTRAMUSCULAR | Status: AC
  Filled 2014-04-04: qty 8

## 2014-04-04 MED ORDER — SODIUM CHLORIDE 0.9 % IJ SOLN: 3.0000 mL | INTRAMUSCULAR | Status: DC | PRN

## 2014-04-04 MED ORDER — FENTANYL CITRATE 0.05 MG/ML IJ SOLN
INTRAMUSCULAR | Status: AC
  Filled 2014-04-04: qty 2

## 2014-04-04 MED ORDER — SODIUM CHLORIDE 0.9 % IV SOLN
1.0000 mL/kg/h | INTRAVENOUS | Status: DC
  Administered 2014-04-04: 1 mL/kg/h via INTRAVENOUS

## 2014-04-04 NOTE — CV Procedure (Signed)
Cardiac Cath Procedure Note  Indication: Heart failure  Procedures performed:  1) Right heart cathererization 2) Selective coronary angiography 3) SVG angiography 4) LIMA angio 5) Left heart catheterization 6) Left ventriculogram  Description of procedure:     The risks and indication of the procedure were explained. Consent was signed and placed on the chart. An appropriate timeout was taken prior to the procedure. The right groin was prepped and draped in the routine sterile fashion and anesthetized with 1% local lidocaine.   A 5 FR arterial sheath was placed in the right femoral artery using a modified Seldinger technique. Standard catheters including a JL4, JR4, IM and angled pigtail were used. All catheter exchanges were made over a wire. A 7 FR venous sheath was placed in the right femoral vein using a modified Seldinger technique. A standard Swan-Ganz catheter was used for the procedure.   Complications:  None apparent  Findings:  RA = 9 RV = 69/0/13 PA = 61/24 (38) PCW = 29 Fick cardiac output/index = 3.4/1.9 Thermo CO/CI = 4.1 /2.2 PVR = 2.7 Woods FA sat = 94% PA sat = 57%, 57%  Ao Pressure: 104/58 (77) LV Pressure: 103/13/29 There was no signficant gradient across the aortic valve on pullback.  Left main: Mild plaque LAD: Totally occluded ostially  LCX: Small ramus with high-grade ostial disease. AV groove LCX appears occluded in mid section. OM-1 occluded proximally.  RCA: Unable to be cannulated. Suspect occluded ostially.   LV-gram done in the RAO projection: Ejection fraction = 15%. Basal inferior and anterior walls only sections that contract.   LIMA to LAD: Patent  SVG to Ramus: Patent  SVG to OM-3: Patent  SVG to R PDA: Patent. The RCA is occluded prior to take-off of PDA. There are collaterals from distal RCA to OM-1 and probably a septal perforator.  Assessment: 1. Severe 3v CAD with patent revascularization 2. Severe iCM with EF ~15% 3.  Elevated filling pressures with borderline low cardiac output 4. Moderate pulmonary venous HTN  Plan/Discussion:  Suspect main issues are elevated filling pressures and low output. Will add metolazone 2.5mg  on Monday and Friday with extra 20 mg of potassium with each dose.  Will follow in HF Clinic. Will likely need CPX and possible consideration for VAD.  Jolaine Artist, MD 10:52 AM

## 2014-04-04 NOTE — H&P (View-Only) (Signed)
HPI: FU coronary artery disease status post coronary bypass graft, ischemic cardiomyopathy and hyperlipidemia. Last Myoview was performed in October 2014. Ejection fraction 21%. Prior anterior and inferior infarct. No ischemia. Patient had CRT upgrade in January 2014. Echocardiogram in August of 2014 showed an ejection fraction of 0000000, grade 2 diastolic dysfunction, mild left atrial enlargement, mild aortic and mitral regurgitation. Carotid Dopplers in May 2014 showed no significant stenosis. Patient has had recurrent syncope felt possibly cough mediated. His pacemaker was interrogated and showed no significant arrhythmias. I last saw him in March 2015. Since then, He continues to have dyspnea on exertion as well as orthopnea. No pedal edema. Occasional chest pain with coughing. He also has had problems with syncope after coughing.   Current Outpatient Prescriptions  Medication Sig Dispense Refill  . acetaminophen (TYLENOL) 500 MG tablet Take 500 mg by mouth every 6 (six) hours as needed. For pain      . albuterol (PROVENTIL HFA;VENTOLIN HFA) 108 (90 BASE) MCG/ACT inhaler Inhale 2 puffs into the lungs every 4 (four) hours as needed for wheezing.  1 Inhaler  6  . allopurinol (ZYLOPRIM) 100 MG tablet Take 1 tablet (100 mg total) by mouth daily.  30 tablet  6  . aspirin (BAYER ASPIRIN) 325 MG tablet Take 1 tablet (325 mg total) by mouth daily.  30 tablet  6  . calcium carbonate (OS-CAL) 600 MG TABS Take 600 mg by mouth daily.        . carvedilol (COREG) 3.125 MG tablet Take 1 tablet (3.125 mg total) by mouth 2 (two) times daily.  180 tablet  3  . famotidine (PEPCID) 10 MG tablet Take 10 mg by mouth daily.       Marland Kitchen FLUoxetine (PROZAC) 20 MG capsule Take 20 mg by mouth daily.      . folic acid (FOLVITE) A999333 MCG tablet Take 400 mcg by mouth daily.        . furosemide (LASIX) 80 MG tablet Take 1 tablet (80 mg total) by mouth 2 (two) times daily.  60 tablet  6  . insulin glargine (LANTUS) 100  UNIT/ML injection Inject 84 Units into the skin at bedtime. Sliding scale, 70-90      . loratadine (CLARITIN) 10 MG tablet Take 10 mg by mouth daily as needed. For allergies      . Multiple Vitamin (MULTIVITAMIN) tablet Take 1 tablet by mouth daily.       Marland Kitchen omega-3 acid ethyl esters (LOVAZA) 1 G capsule Take 2 g by mouth 2 (two) times daily.        . potassium chloride SA (K-DUR,KLOR-CON) 20 MEQ tablet Take 1 tablet (20 mEq total) by mouth daily.  90 tablet  3  . pravastatin (PRAVACHOL) 80 MG tablet Take 1 tablet (80 mg total) by mouth every evening.  90 tablet  3  . ramipril (ALTACE) 2.5 MG capsule TAKE 1 CAPSULE BY MOUTH ONCE A DAY  90 capsule  0  . spironolactone (ALDACTONE) 25 MG tablet Take 25 mg by mouth daily.         No current facility-administered medications for this visit.     Past Medical History  Diagnosis Date  . Gout   . Ventricular tachycardia   . Ischemic heart disease   . Congestive heart failure   . Left bundle branch block   . Hyperlipidemia   . Pacemaker   . ICD (implantable cardiac defibrillator) in place   . Hypertension   .  Heart murmur   . Anginal pain   . Myocardial infarction 2000; 2002; 2004; ~ 2006  . Pneumonia     "once, I think" (12/16/2012)  . Diabetes mellitus type II     IDDM , VAH  . Gout     roast beef is a trigger    Past Surgical History  Procedure Laterality Date  . Coronary artery bypass graft  2000    CABG X4  . Urachal cyst excision  ~ 1995  . Vasectomy  ?1967  . A-v cardiac pacemaker insertion  2002; 2010  . Insert / replace / remove pacemaker  12/16/2012    LV lead placement; ICD assessment   . Tonsillectomy and adenoidectomy  1954  . Cardiac catheterization      "3 or 4; never had interventions" (12/16/2012)    History   Social History  . Marital Status: Married    Spouse Name: N/A    Number of Children: N/A  . Years of Education: N/A   Occupational History  . Not on file.   Social History Main Topics  . Smoking  status: Never Smoker   . Smokeless tobacco: Former Systems developer    Types: Snuff, Chew     Comment: 12/16/2012 "stopped chew/snuff in ~ 1992 after 15 yr"  . Alcohol Use: 4.2 oz/week    7 Shots of liquor per week     Comment: one shot a day  . Drug Use: No     Comment: none  . Sexual Activity: Yes   Other Topics Concern  . Not on file   Social History Narrative  . No narrative on file    ROS: no fevers or chills, productive cough, hemoptysis, dysphasia, odynophagia, melena, hematochezia, dysuria, hematuria, rash, seizure activity, orthopnea, PND, pedal edema, claudication. Remaining systems are negative.  Physical Exam: Well-developed well-nourished in no acute distress.  Skin is warm and dry.  HEENT is normal.  Neck is supple.  Chest is clear to auscultation with normal expansion. ICD left chest. Cardiovascular exam is regular rate and rhythm.  Abdominal exam nontender or distended. No masses palpated. Extremities show no edema. neuro grossly intact

## 2014-04-04 NOTE — Interval H&P Note (Signed)
History and Physical Interval Note:  04/04/2014 10:05 AM  Mason Arnold  has presented today for surgery, with the diagnosis of CP  The various methods of treatment have been discussed with the patient and family. After consideration of risks, benefits and other options for treatment, the patient has consented to  Procedure(s): LEFT AND RIGHT HEART CATHETERIZATION WITH CORONARY/GRAFT ANGIOGRAM (N/A) with possible angioplasty Cath Lab Visit (complete for each Cath Lab visit)  Clinical Evaluation Leading to the Procedure:   ACS: no  Non-ACS:    Anginal Classification: CCS III  Anti-ischemic medical therapy: Minimal Therapy (1 class of medications)  Non-Invasive Test Results: No non-invasive testing performed  Prior CABG: Previous CABG      as a surgical intervention .  The patient's history has been reviewed, patient examined, no change in status, stable for surgery.  I have reviewed the patient's chart and labs.  Questions were answered to the patient's satisfaction.     Shaune Pascal Riana Tessmer

## 2014-04-04 NOTE — Discharge Instructions (Signed)
Arteriogram °Care After °These instructions give you information on caring for yourself after your procedure. Your doctor may also give you more specific instructions. Call your doctor if you have any problems or questions after your procedure. °HOME CARE °· Stay in bed the rest of the day. °· Keep your leg straight for at least 6 hours. °· Do not lift anything heavier than 10 pounds (about a gallon of milk) for 2 days. °· Do not walk a lot, run, or drive for 2 days. °· Return to normal activities in 2 days or as told by your doctor. °Finding out the results of your test °Ask when your test results will be ready. Make sure you get your test results. °GET HELP RIGHT AWAY IF:  °· You have fever of 102° F (38.9° C) or higher. °· You have more pain in your leg. °· The leg that was cut is: °· Bleeding. °· Puffy (swollen) or red. °· Cold. °· Pale or changes color. °· Weak. °· Tingly or numb. °If you go to the Emergency Room, tell your nurse that you have had an arteriogram. Take this paper with you to show the nurse. °MAKE SURE YOU: °· Understand these instructions. °· Will watch your condition. °· Will get help right away if you are not doing well or get worse. °Document Released: 02/20/2009 Document Revised: 02/16/2012 Document Reviewed: 02/20/2009 °Exit[employer]® Patient Information ©2014 [employer]. ° °

## 2014-04-05 ENCOUNTER — Other Ambulatory Visit (HOSPITAL_COMMUNITY): Payer: Self-pay | Admitting: Cardiology

## 2014-04-05 ENCOUNTER — Ambulatory Visit: Payer: Medicare Other | Admitting: Internal Medicine

## 2014-04-05 DIAGNOSIS — I509 Heart failure, unspecified: Secondary | ICD-10-CM

## 2014-04-05 DIAGNOSIS — I5022 Chronic systolic (congestive) heart failure: Secondary | ICD-10-CM

## 2014-04-05 MED ORDER — POTASSIUM CHLORIDE CRYS ER 20 MEQ PO TBCR: 20.0000 meq | EXTENDED_RELEASE_TABLET | Freq: Every day | ORAL | Status: DC

## 2014-04-05 MED ORDER — METOLAZONE 2.5 MG PO TABS: ORAL_TABLET | ORAL | Status: DC

## 2014-04-06 ENCOUNTER — Ambulatory Visit (INDEPENDENT_AMBULATORY_CARE_PROVIDER_SITE_OTHER): Payer: Medicare Other | Admitting: *Deleted

## 2014-04-06 ENCOUNTER — Encounter: Payer: Self-pay | Admitting: Internal Medicine

## 2014-04-06 DIAGNOSIS — I428 Other cardiomyopathies: Secondary | ICD-10-CM

## 2014-04-11 ENCOUNTER — Encounter (HOSPITAL_COMMUNITY): Payer: Self-pay

## 2014-04-11 ENCOUNTER — Ambulatory Visit (HOSPITAL_COMMUNITY)
Admission: RE | Admit: 2014-04-11 | Discharge: 2014-04-11 | Disposition: A | Discharge status: Home / Self Care | Payer: Medicare Other | Source: Ambulatory Visit | Attending: Internal Medicine | Admitting: Internal Medicine

## 2014-04-11 VITALS — BP 92/58 | HR 60 | Ht 65.0 in | Wt 164.4 lb

## 2014-04-11 DIAGNOSIS — I129 Hypertensive chronic kidney disease with stage 1 through stage 4 chronic kidney disease, or unspecified chronic kidney disease: Secondary | ICD-10-CM | POA: Insufficient documentation

## 2014-04-11 DIAGNOSIS — I5022 Chronic systolic (congestive) heart failure: Secondary | ICD-10-CM

## 2014-04-11 DIAGNOSIS — I4891 Unspecified atrial fibrillation: Secondary | ICD-10-CM | POA: Insufficient documentation

## 2014-04-11 DIAGNOSIS — N189 Chronic kidney disease, unspecified: Secondary | ICD-10-CM | POA: Insufficient documentation

## 2014-04-11 DIAGNOSIS — Z951 Presence of aortocoronary bypass graft: Secondary | ICD-10-CM | POA: Insufficient documentation

## 2014-04-11 DIAGNOSIS — I2510 Atherosclerotic heart disease of native coronary artery without angina pectoris: Secondary | ICD-10-CM | POA: Insufficient documentation

## 2014-04-11 DIAGNOSIS — I2589 Other forms of chronic ischemic heart disease: Secondary | ICD-10-CM | POA: Insufficient documentation

## 2014-04-11 DIAGNOSIS — I509 Heart failure, unspecified: Secondary | ICD-10-CM | POA: Insufficient documentation

## 2014-04-11 DIAGNOSIS — R55 Syncope and collapse: Secondary | ICD-10-CM | POA: Insufficient documentation

## 2014-04-11 DIAGNOSIS — E785 Hyperlipidemia, unspecified: Secondary | ICD-10-CM | POA: Insufficient documentation

## 2014-04-11 DIAGNOSIS — E119 Type 2 diabetes mellitus without complications: Secondary | ICD-10-CM | POA: Insufficient documentation

## 2014-04-11 DIAGNOSIS — Z95810 Presence of automatic (implantable) cardiac defibrillator: Secondary | ICD-10-CM | POA: Insufficient documentation

## 2014-04-11 LAB — BASIC METABOLIC PANEL
BUN: 38 mg/dL — AB (ref 6–23)
CO2: 27 mEq/L (ref 19–32)
Calcium: 11.1 mg/dL — ABNORMAL HIGH (ref 8.4–10.5)
Chloride: 84 mEq/L — ABNORMAL LOW (ref 96–112)
Creatinine, Ser: 1.56 mg/dL — ABNORMAL HIGH (ref 0.50–1.35)
GFR calc Af Amer: 49 mL/min — ABNORMAL LOW (ref >90)
GFR, EST NON AFRICAN AMERICAN: 42 mL/min — AB (ref >90)
Glucose, Bld: 143 mg/dL — ABNORMAL HIGH (ref 70–99)
POTASSIUM: 3.7 meq/L (ref 3.7–5.3)
Sodium: 131 mEq/L — ABNORMAL LOW (ref 137–147)

## 2014-04-11 LAB — PRO B NATRIURETIC PEPTIDE: Pro B Natriuretic peptide (BNP): 1353 pg/mL — ABNORMAL HIGH (ref 0–125)

## 2014-04-11 MED ORDER — DIGOXIN 125 MCG PO TABS: 0.1250 mg | ORAL_TABLET | Freq: Every day | ORAL | Status: DC

## 2014-04-11 MED ORDER — METOLAZONE 2.5 MG PO TABS: ORAL_TABLET | ORAL | Status: DC

## 2014-04-11 NOTE — Patient Instructions (Signed)
Start Digoxin 0.125 mg daily  Take Metolazone 2.5 mg on Friday, then starting on Monday 04/17/14 take medication every Monday  Lab today  Your physician has recommended that you have a cardiopulmonary stress test (CPX). CPX testing is a non-invasive measurement of heart and lung function. It replaces a traditional treadmill stress test. This type of test provides a tremendous amount of information that relates not only to your present condition but also for future outcomes. This test combines measurements of you ventilation, respiratory gas exchange in the lungs, electrocardiogram (EKG), blood pressure and physical response before, during, and following an exercise protocol.  Your physician has requested that you regularly monitor and record your blood pressure readings at home. Please use the same machine at the same time of day to check your readings and record them to bring to your follow-up visit.  You have been referred to Cardiac Rehab, they will contact you to schedule  Your physician recommends that you schedule a follow-up appointment in: 3 weeks

## 2014-04-12 NOTE — Progress Notes (Signed)
Patient ID: Mason Arnold, male   DOB: Jul 30, 1941, 73 y.o.   MRN: [id number] PCP: Dr. Linna Darner Primary cardiologist: Dr. Stanford Breed  73 yo with history of CAD s/p CABG, ischemic cardiomyopathy with [employer] CRT-D, paroxysmal atrial fibrillation, and recurrent syncope of uncertain etiology presents for CHF clinic followup.  He had RHC/LHC in 4/15 showing patent grafts but elevated filling pressures and low cardiac index (1.9 Fick/2.2 thermo).  Since that time, he has been started on twice weekly metolazone.  Weight is down about 6 lbs and he is feeling much better.  He can walk 1/10 of a mile without dyspnea (improved) and can walk around his house without problems.  He is short of breath walking up a flight of steps. Still has 2 pillow orthopnea, no PND.  BP is low today at 92/58 but he denies lightheadedness. SBP at home is running 100s-110s. Abdominal distention has decreased.  No chest pain.  No tachypalpitations.   Patient has a history of recurrent syncope of uncertain etiology.  Sometimes he gets presyncopal spells with coughing, but he has syncope at other times with no trigger.  He is not on coumadin (PAF) because of falls risk.  He has not had true syncope with fall for several months now.  Since his device has been in place, he has had VT detected but this has not been clearly correlated with syncopal episodes.    Labs (4/15): K 4.7, creatinine 1.4  PMH: 1. HTN 2. Hyperlipidemia 3. CAD: s/p CABG with LIMA-LAD, SVG-ramus, SVG-OM3, SVG-PDA.  LHC (4/15) with patent grafts.  4. Ischemic cardiomyopathy: Echo (8/14) with EF 20-25% with regional WMAs, mild MR, mildly decreased RV systolic function.  EF LV-gram (4/15) was 15%. He has a [employer] CRT-D device (upgraded 1/14).  RHC (4/15): mean RA 9, PA 61/24 (mean 38), mean PCWP 29, CI 1.9 Fick/2.2 thermo, PVR 2.7.  5. Syncopal episodes: Relatively frequent, etiology uncertain.  He does get lightheaded/near-syncopal with coughing spells, but he has  syncope at other times also with no warning.  Since device has been in place, he has had VT detected but it has not been clearly correlated with syncopal episodes.  6. Atrial fibrillation: Paroxysmal.  Has not been anticoagulated due to relatively frequent syncope/falls.  7. Gout 8. H/o LBBB 9. Type II diabetes 10. CKD  SH: Married, lives in [place], nonsmoker, rare ETOH.   FH: CAD  ROS: All systems reviewed and negative except as per HPI.   Current Outpatient Prescriptions  Medication Sig Dispense Refill  . acetaminophen (TYLENOL) 500 MG tablet Take 500 mg by mouth every 6 (six) hours as needed. For pain      . albuterol (PROVENTIL HFA;VENTOLIN HFA) 108 (90 BASE) MCG/ACT inhaler Inhale 2 puffs into the lungs every 4 (four) hours as needed for wheezing.  1 Inhaler  6  . allopurinol (ZYLOPRIM) 100 MG tablet Take 1 tablet (100 mg total) by mouth daily.  30 tablet  6  . aspirin (BAYER ASPIRIN) 325 MG tablet Take 1 tablet (325 mg total) by mouth daily.  30 tablet  6  . calcium carbonate (OS-CAL) 600 MG TABS Take 600 mg by mouth daily.        . carvedilol (COREG) 3.125 MG tablet Take 1 tablet (3.125 mg total) by mouth 2 (two) times daily.  180 tablet  3  . famotidine (PEPCID) 10 MG tablet Take 10 mg by mouth daily.       Marland Kitchen FLUoxetine (PROZAC) 20 MG capsule Take 20  mg by mouth daily.      . furosemide (LASIX) 80 MG tablet Take 1 tablet (80 mg total) by mouth 2 (two) times daily.  60 tablet  6  . insulin glargine (LANTUS) 100 UNIT/ML injection Inject 30-42 Units into the skin at bedtime. Sliding scale      . loratadine (CLARITIN) 10 MG tablet Take 10 mg by mouth daily as needed. For allergies      . metolazone (ZAROXOLYN) 2.5 MG tablet Take one tab (2.5 mg) on every Monday  30 tablet  3  . Multiple Vitamin (MULTIVITAMIN) tablet Take 1 tablet by mouth daily.       Marland Kitchen omega-3 acid ethyl esters (LOVAZA) 1 G capsule Take 2 g by mouth 2 (two) times daily.        . potassium chloride SA  (K-DUR,KLOR-CON) 20 MEQ tablet Take 1 tablet (20 mEq total) by mouth daily. Take Additional tab (40 MeQtotal) on Metolazone Days (MON&FRI)  115 tablet  3  . pravastatin (PRAVACHOL) 80 MG tablet Take 1 tablet (80 mg total) by mouth every evening.  90 tablet  3  . ramipril (ALTACE) 2.5 MG capsule Take 2.5 mg by mouth daily.      Marland Kitchen spironolactone (ALDACTONE) 25 MG tablet Take 25 mg by mouth daily.        . digoxin (LANOXIN) 0.125 MG tablet Take 1 tablet (125 mcg total) by mouth daily.  30 tablet  3  . digoxin (LANOXIN) 0.125 MG tablet Take 1 tablet (0.125 mg total) by mouth daily.  30 tablet  3   No current facility-administered medications for this encounter.    BP 92/58  Pulse 60  Ht 5\' 5"  (1.651 m)  Wt 164 lb 6.4 oz (74.571 kg)  BMI 27.36 kg/m2  SpO2 92% General: NAD Neck: No JVD, no thyromegaly or thyroid nodule.  Lungs: Clear to auscultation bilaterally with normal respiratory effort. CV: Nondisplaced PMI.  Heart regular S1/S2, no S3/S4, no murmur.  No peripheral edema.  No carotid bruit.  Normal pedal pulses.  Abdomen: Soft, nontender, no hepatosplenomegaly, no distention.  Skin: Intact without lesions or rashes.  Neurologic: Alert and oriented x 3.  Psych: Normal affect. Extremities: No clubbing or cyanosis.  HEENT: Normal.   Assessment/Plan: 1. Chronic systolic CHF: Ischemic cardiomyopathy, EF 20-25% on last echo.  Has St Jude CRT-D device.  RHC (4/15) showed elevated filling pressures and low output.  His diuretic regimen was advanced after RHC, with corresponding symptomatic improvement and weight loss.  He still has NYHA class III symptoms.  On exam, volume does not look bad.   - I am going to continue current regimen of Lasix 80 mg bid.  Will continue metolazone 2.5 twice weekly until after next week, then will decrease to once weekly.  Will need to follow BMET closely, will check today.  - Continue current ramipril, spironolactone, and Coreg.  I will not adjust today given  soft blood pressure.  - He was on digoxin in the past but this was stopped as a possible cause of syncope.  This does not seem to have been the cause.  With low output, will restart digoxin at 0.125 mg daily.  - He will check BP and weight daily. - I will arrange for CPX.  Given age, not good transplant candidate.  Possible LVAD candidate down the road but anticoagulation will be an issue: has not been on coumadin due to fall risk, would have to be anticoagulated with LVAD.  2.  CAD: Patent grafts on LHC in 4/15.  No chest pain.  Continue ASA, statin.  3. Paroxysmal atrial fibrillation: He is in NSR today.  Has not been anticoagulated due to fall risk (syncopal episodes).  4. Syncope: Long-standing history at this point, uncertain etiology.  Has CRT-D device in place.  He has had VT noted on device but this does not clearly correlate with syncope.  He has presyncope with coughing spells but other syncopal events at random times.  ?vasovagal.  Not on anticoagulation because of fall risk.   Followup in office in 3 wks.   Larey Dresser 04/12/2014

## 2014-04-24 ENCOUNTER — Ambulatory Visit (HOSPITAL_COMMUNITY): Payer: Medicare Other | Attending: Internal Medicine

## 2014-04-24 DIAGNOSIS — I5022 Chronic systolic (congestive) heart failure: Secondary | ICD-10-CM

## 2014-04-26 LAB — MDC_IDC_ENUM_SESS_TYPE_REMOTE
Battery Remaining Percentage: 47 %
Battery Voltage: 2.9 V
Brady Statistic AP VP Percent: 90 %
Brady Statistic AS VP Percent: 9 %
Brady Statistic AS VS Percent: 1 %
Brady Statistic RA Percent Paced: 89 %
HighPow Impedance: 44 Ohm
Implantable Pulse Generator Serial Number: 733092
Lead Channel Impedance Value: 440 Ohm
Lead Channel Pacing Threshold Pulse Width: 0.5 ms
Lead Channel Sensing Intrinsic Amplitude: 3.7 mV
Lead Channel Setting Pacing Amplitude: 2 V
Lead Channel Setting Pacing Amplitude: 3 V
Lead Channel Setting Pacing Pulse Width: 0.5 ms
Lead Channel Setting Pacing Pulse Width: 0.8 ms
MDC IDC MSMT BATTERY REMAINING LONGEVITY: 32 mo
MDC IDC MSMT LEADCHNL LV IMPEDANCE VALUE: 530 Ohm
MDC IDC MSMT LEADCHNL LV PACING THRESHOLD AMPLITUDE: 1 V
MDC IDC MSMT LEADCHNL RV IMPEDANCE VALUE: 590 Ohm
MDC IDC MSMT LEADCHNL RV SENSING INTR AMPL: 12 mV
MDC IDC SESS DTM: 20150430095419
MDC IDC SET LEADCHNL LV PACING AMPLITUDE: 2 V
MDC IDC SET LEADCHNL RV SENSING SENSITIVITY: 0.5 mV
MDC IDC SET ZONE DETECTION INTERVAL: 375 ms
MDC IDC STAT BRADY AP VS PERCENT: 1 %
Zone Setting Detection Interval: 250 ms
Zone Setting Detection Interval: 285 ms

## 2014-04-28 NOTE — Progress Notes (Signed)
Remote ICD transmission.   

## 2014-05-03 ENCOUNTER — Ambulatory Visit (INDEPENDENT_AMBULATORY_CARE_PROVIDER_SITE_OTHER): Payer: Medicare Other | Admitting: Internal Medicine

## 2014-05-03 ENCOUNTER — Other Ambulatory Visit (INDEPENDENT_AMBULATORY_CARE_PROVIDER_SITE_OTHER): Payer: Medicare Other

## 2014-05-03 ENCOUNTER — Encounter: Payer: Self-pay | Admitting: Internal Medicine

## 2014-05-03 VITALS — BP 96/60 | HR 88 | Temp 98.1°F | Ht 65.0 in | Wt 168.8 lb

## 2014-05-03 DIAGNOSIS — M109 Gout, unspecified: Secondary | ICD-10-CM

## 2014-05-03 DIAGNOSIS — I255 Ischemic cardiomyopathy: Secondary | ICD-10-CM

## 2014-05-03 DIAGNOSIS — Z0000 Encounter for general adult medical examination without abnormal findings: Secondary | ICD-10-CM

## 2014-05-03 LAB — URIC ACID: URIC ACID, SERUM: 8.8 mg/dL — AB (ref 4.0–7.8)

## 2014-05-03 NOTE — Progress Notes (Signed)
Subjective:    Patient ID: Mason Arnold, male    DOB: [birth year], 73 y.o.   MRN: [id number]  HPI  [employer]HC/ A[employer] Medicare Wellness Visit: Psychosocial and medical history were reviewed as required by Medicare (history related to abuse, antisocial behavior , firearm risk). Social history: Caffeine: 3 cups coffee/day  , Alcohol: 1 shot or < / day , Tobacco KWI:OXBDZ  Exercise:walking 1/2 mpd over 10-15 min 7 days / week Personal safety/fall risk:no Limitations of activities of daily living:no Seatbelt/ smoke alarm use:yes Healthcare Power of Attorney/Living Will status: needed Ophthalmologic exam status:due @ [facility] Hearing evaluation: UTD Orientation: Oriented X 3 Memory and recall: good Spelling testing: good Depression/anxiety assessment: no Foreign travel history: [place] 1995 Immunization status for influenza/pneumonia/ shingles /tetanus:UTD Transfusion history:no Preventive health care maintenance status: Colonoscopy as per protocol/standard care: UTD , done @ [facility] care:every 6 mos Chart reviewed and updated. Active issues reviewed and addressed as documented below.    Review of Systems  Chest pain, palpitations, tachycardia, paroxysmal nocturnal dyspnea, claudication or edema are absent. DOE post 1/4 mile, but variable.   His recent catheterization revealed an ejection fraction of 15%; he is now being followed in the congestive heart failure clinic.  He has completed a cardiopulmonary stress test; he will review the results 5/29.  Last uric acid 9.5 in 2011.On low dose Allopurinol.No gout for 5 mos. Creat 1.56 on 04/11/14.On low dose Ramipril.  DM managed @ [facility]; now on 70 units insulin ! No hypoglycemia.       Objective:   Physical Exam Gen.: Healthy and well-nourished in appearance. Alert, appropriate and cooperative throughout exam. Appears younger than stated age  Head: Normocephalic without obvious abnormalities;  pattern alopecia  Eyes: No corneal or  conjunctival inflammation noted. Pupils equal round reactive to light and accommodation. Extraocular motion intact. Ears: External  ear exam reveals no significant lesions or deformities. Canals clear .TMs normal. Hearing is grossly decreased bilaterally. Nose: External nasal exam reveals no deformity or inflammation. Nasal mucosa are pink and moist. No lesions or exudates noted.   Mouth: Oral mucosa and oropharynx reveal no lesions or exudates. Teeth in good repair. Neck: No deformities, masses, or tenderness noted. Range of motion decreaserd. Thyroid normal. Lungs: Normal respiratory effort; chest expands symmetrically. Lungs are clear to auscultation without rales, wheezes, or increased work of breathing. Heart: Normal rate and rhythm. Accentuated S1 and S2. No gallop, click, or rub. Grade 1 systolic murmur. Abdomen: Bowel sounds normal; abdomen soft and nontender. No masses, organomegaly or hernias noted. No HJR Genitalia:done @ [facility]                             Musculoskeletal/extremities: No deformity or scoliosis noted of  the thoracic or lumbar spine. No clubbing, cyanosis, edema, or significant extremity  deformity noted. Range of motion normal .Tone & strength normal. Hand joints : flexion 3 L finger. Fingernail  health good. Able to lie down & sit up w/o help. Negative SLR bilaterally Vascular: Carotid, radial artery, dorsalis pedis and  posterior tibial pulses are equal. Decreased DPP. No bruits present. Neurologic: Alert and oriented x3. Deep tendon reflexes symmetrical and normal.  Gait normal .      Skin: Intact without suspicious lesions or rashes. Lymph: No cervical, axillary lymphadenopathy present. Psych: Mood and affect are normal. Normally interactive  Assessment & Plan:  #1 Medicare Wellness Exam; criteria met ; data entered #2 Problem List/Diagnoses reviewed Plan:  Assessments  made/ Orders entered

## 2014-05-03 NOTE — Assessment & Plan Note (Signed)
Uric acid

## 2014-05-03 NOTE — Patient Instructions (Signed)
Your next office appointment will be determined based upon review of your pending labs. Those instructions will be transmitted to you through My Chart . 

## 2014-05-03 NOTE — Progress Notes (Signed)
Pre visit review using our clinic review tool, if applicable. No additional management support is needed unless otherwise documented below in the visit note. 

## 2014-05-05 ENCOUNTER — Ambulatory Visit (HOSPITAL_COMMUNITY)
Admission: RE | Admit: 2014-05-05 | Discharge: 2014-05-05 | Disposition: A | Discharge status: Home / Self Care | Payer: Medicare Other | Source: Ambulatory Visit | Attending: Internal Medicine | Admitting: Internal Medicine

## 2014-05-05 ENCOUNTER — Encounter (HOSPITAL_COMMUNITY): Payer: Self-pay

## 2014-05-05 VITALS — BP 82/48 | HR 82 | Wt 168.1 lb

## 2014-05-05 DIAGNOSIS — I4891 Unspecified atrial fibrillation: Secondary | ICD-10-CM

## 2014-05-05 DIAGNOSIS — I447 Left bundle-branch block, unspecified: Secondary | ICD-10-CM | POA: Insufficient documentation

## 2014-05-05 DIAGNOSIS — M109 Gout, unspecified: Secondary | ICD-10-CM | POA: Insufficient documentation

## 2014-05-05 DIAGNOSIS — E785 Hyperlipidemia, unspecified: Secondary | ICD-10-CM | POA: Insufficient documentation

## 2014-05-05 DIAGNOSIS — I129 Hypertensive chronic kidney disease with stage 1 through stage 4 chronic kidney disease, or unspecified chronic kidney disease: Secondary | ICD-10-CM | POA: Insufficient documentation

## 2014-05-05 DIAGNOSIS — N189 Chronic kidney disease, unspecified: Secondary | ICD-10-CM | POA: Insufficient documentation

## 2014-05-05 DIAGNOSIS — Z794 Long term (current) use of insulin: Secondary | ICD-10-CM | POA: Insufficient documentation

## 2014-05-05 DIAGNOSIS — Z7982 Long term (current) use of aspirin: Secondary | ICD-10-CM | POA: Insufficient documentation

## 2014-05-05 DIAGNOSIS — Z951 Presence of aortocoronary bypass graft: Secondary | ICD-10-CM | POA: Insufficient documentation

## 2014-05-05 DIAGNOSIS — I509 Heart failure, unspecified: Secondary | ICD-10-CM | POA: Insufficient documentation

## 2014-05-05 DIAGNOSIS — E119 Type 2 diabetes mellitus without complications: Secondary | ICD-10-CM | POA: Insufficient documentation

## 2014-05-05 DIAGNOSIS — I5022 Chronic systolic (congestive) heart failure: Secondary | ICD-10-CM | POA: Insufficient documentation

## 2014-05-05 DIAGNOSIS — I2589 Other forms of chronic ischemic heart disease: Secondary | ICD-10-CM | POA: Insufficient documentation

## 2014-05-05 DIAGNOSIS — I2510 Atherosclerotic heart disease of native coronary artery without angina pectoris: Secondary | ICD-10-CM | POA: Insufficient documentation

## 2014-05-05 DIAGNOSIS — Z9181 History of falling: Secondary | ICD-10-CM | POA: Insufficient documentation

## 2014-05-05 DIAGNOSIS — Z79899 Other long term (current) drug therapy: Secondary | ICD-10-CM | POA: Insufficient documentation

## 2014-05-05 LAB — BASIC METABOLIC PANEL
BUN: 29 mg/dL — ABNORMAL HIGH (ref 6–23)
CALCIUM: 10 mg/dL (ref 8.4–10.5)
CO2: 31 mEq/L (ref 19–32)
Chloride: 82 mEq/L — ABNORMAL LOW (ref 96–112)
Creatinine, Ser: 1.53 mg/dL — ABNORMAL HIGH (ref 0.50–1.35)
GFR calc non Af Amer: 43 mL/min — ABNORMAL LOW (ref >90)
GFR, EST AFRICAN AMERICAN: 50 mL/min — AB (ref >90)
Glucose, Bld: 182 mg/dL — ABNORMAL HIGH (ref 70–99)
Potassium: 4 mEq/L (ref 3.7–5.3)
SODIUM: 131 meq/L — AB (ref 137–147)

## 2014-05-05 LAB — DIGOXIN LEVEL: DIGOXIN LVL: 0.9 ng/mL (ref 0.8–2.0)

## 2014-05-05 NOTE — Addendum Note (Signed)
Encounter addended by: Scarlette Calico, RN on: 05/05/2014 11:39 AM<BR>     Documentation filed: Patient Instructions Section, Orders

## 2014-05-05 NOTE — Progress Notes (Signed)
Patient ID: Mason Arnold, male   DOB: 1941/03/20, 73 y.o.   MRN: [id number] PCP: Dr. Linna Darner Primary cardiologist: Dr. Stanford Breed  73 yo with history of CAD s/p CABG, ischemic cardiomyopathy (20%) with [employer] CRT-D, paroxysmal atrial fibrillation, and recurrent syncope of uncertain etiology presents for CHF clinic followup.  He had RHC/LHC in 4/15 showing patent grafts but elevated filling pressures and low cardiac index (1.9 Fick/2.2 thermo).  Since that time, he was started on twice weekly metolazone and digoxin.  Weight came down about 6 lbs. He saw Dr. Aundra Dubin 04/11/14 and metolazone cut back to weekly.   RHC (4/15): mean RA 9, PA 61/24 (mean 38), mean PCWP 29, CI 1.9 Fick/2.2 thermo, PVR 2.7.   Had CPX 04/24/14: FVC 2.70 (78%)  FEV1 2.15 (86%)  FEV1/FVC 80%  MVV 84 (80%) Resting HR: 65 Peak HR: 134 (92% age predicted max HR)  Peak VO2: 19.6 (76.1% predicted peak VO2) VE/VCO2 slope: 34.8  OUES: 1.58 Peak RER: 1.23 Ventilatory Threshold: 15.0 (58.3% predicted peak VO2) Peak RR 46 Peak Ventilation: 68.1 VE/MVV: 81% PETCO2 at peak: 30 O2pulse: 11 (76% predicted O2pulse)   Follow-up:  Feels pretty good. Weight very stable 162-165. If weight goes up 2 pounds or more takes extra lasix - usually about once a week. Still taking metolazone on Mondays. SBP 95-120 at home. Occasionally lower. Exercise capacity improved. Can do all ADLs without problem. Doesn't go to store too often. Weight down about 20 pounds over past 6 months. Syncopal spells have resolved.  He is not on coumadin (PAF) because of falls risk.  Labs (4/15): K 4.7, creatinine 1.4 04/11/14: K 3.7 Cr 1.56 pBNP 1,353  PMH: 1. HTN 2. Hyperlipidemia 3. CAD: s/p CABG with LIMA-LAD, SVG-ramus, SVG-OM3, SVG-PDA.  LHC (4/15) with patent grafts.  4. Ischemic cardiomyopathy: Echo (8/14) with EF 20-25% with regional WMAs, mild MR, mildly decreased RV systolic function.  EF LV-gram (4/15) was 15%. He has a [employer], [employer] CRT-D device (upgraded  1/14).  RHC (4/15): mean RA 9, PA 61/24 (mean 38), mean PCWP 29, CI 1.9 Fick/2.2 thermo, PVR 2.7.  5. Syncopal episodes: Relatively frequent, etiology uncertain.  He does get lightheaded/near-syncopal with coughing spells, but he has syncope at other times also with no warning.  Since device has been in place, he has had VT detected but it has not been clearly correlated with syncopal episodes.  6. Atrial fibrillation: Paroxysmal.  Has not been anticoagulated due to relatively frequent syncope/falls.  7. Gout 8. H/o LBBB 9. Type II diabetes 10. CKD  SH: Married, lives in [place], nonsmoker, rare ETOH.   FH: CAD  ROS: All systems reviewed and negative except as per HPI.   Current Outpatient Prescriptions  Medication Sig Dispense Refill  . acetaminophen (TYLENOL) 500 MG tablet Take 500 mg by mouth every 6 (six) hours as needed. For pain      . albuterol (PROVENTIL HFA;VENTOLIN HFA) 108 (90 BASE) MCG/ACT inhaler Inhale 2 puffs into the lungs every 4 (four) hours as needed for wheezing.  1 Inhaler  6  . allopurinol (ZYLOPRIM) 100 MG tablet Take 1 tablet (100 mg total) by mouth daily.  30 tablet  6  . aspirin (BAYER ASPIRIN) 325 MG tablet Take 1 tablet (325 mg total) by mouth daily.  30 tablet  6  . calcium carbonate (OS-CAL) 600 MG TABS Take 600 mg by mouth daily.        . carvedilol (COREG) 3.125 MG tablet Take 1 tablet (3.125 mg total)  by mouth 2 (two) times daily.  180 tablet  3  . digoxin (LANOXIN) 0.125 MG tablet Take 1 tablet (0.125 mg total) by mouth daily.  30 tablet  3  . famotidine (PEPCID) 10 MG tablet Take 10 mg by mouth daily.       Marland Kitchen FLUoxetine (PROZAC) 20 MG capsule Take 20 mg by mouth daily.      . furosemide (LASIX) 80 MG tablet Take 1 tablet (80 mg total) by mouth 2 (two) times daily.  60 tablet  6  . insulin glargine (LANTUS) 100 UNIT/ML injection Inject 30-42 Units into the skin at bedtime. Sliding scale      . loratadine (CLARITIN) 10 MG tablet Take 10 mg by mouth daily  as needed. For allergies      . metolazone (ZAROXOLYN) 2.5 MG tablet Take one tab (2.5 mg) on every Monday  30 tablet  3  . Multiple Vitamin (MULTIVITAMIN) tablet Take 1 tablet by mouth daily.       Marland Kitchen omega-3 acid ethyl esters (LOVAZA) 1 G capsule Take 2 g by mouth 2 (two) times daily.        . potassium chloride SA (K-DUR,KLOR-CON) 20 MEQ tablet Take 1 tablet (20 mEq total) by mouth daily. Take Additional tab (40 MeQtotal) on Metolazone Days (MON&FRI)  115 tablet  3  . pravastatin (PRAVACHOL) 80 MG tablet Take 1 tablet (80 mg total) by mouth every evening.  90 tablet  3  . ramipril (ALTACE) 2.5 MG capsule Take 2.5 mg by mouth daily.      Marland Kitchen spironolactone (ALDACTONE) 25 MG tablet Take 25 mg by mouth daily.         No current facility-administered medications for this encounter.    BP 82/48  Pulse 82  Wt 168 lb 1.9 oz (76.259 kg)  SpO2 97% General: NAD Neck: No JVD, no thyromegaly or thyroid nodule.  Lungs: Clear to auscultation bilaterally with normal respiratory effort. CV: Nondisplaced PMI.  Heart regular S1/S2, no S3/S4, no murmur.  No peripheral edema.  No carotid bruit.  Normal pedal pulses.  Abdomen: Soft, nontender, no hepatosplenomegaly, no distention.  Skin: Intact without lesions or rashes.  Neurologic: Alert and oriented x 3.  Psych: Normal affect. Extremities: No clubbing or cyanosis.  HEENT: Normal.   Assessment/Plan: 1. Chronic systolic CHF: Ischemic cardiomyopathy, EF 20-25% on last echo.  Has [employer] CRT-D device.  RHC (4/15) showed elevated filling pressures and low output.  His diuretic regimen was advanced after RHC, with corresponding symptomatic improvement and weight loss.  Now with NYHA class II-III symptoms. --CPX testing looks better than I anticipated. Continue medical therapy.  - Continue current ramipril, spironolactone, and Coreg.  I will not adjust today given soft blood pressure.  - He was on digoxin in the past but this was stopped as a possible cause of  syncope.  This does not seem to have been the cause.  With low output, will restart digoxin at 0.125 mg daily.  - He will check BP and weight daily. Baseline target weight probably now 163-166. - Check labs today including digoxin level 2. CAD: Patent grafts on LHC in 4/15.  No chest pain.  Continue ASA, statin.  3. Paroxysmal atrial fibrillation: He is in NSR today.  Has not been anticoagulated due to fall risk (syncopal episodes). I think we may be able to reconsider this particularly with using apixaban. I told him if he does not have any falls in next 4 week we  will push him very hard to consider apixaban. He will also discuss with Dr. Stanford Breed.  4. Syncope: Long-standing history at this point, uncertain etiology.  Has CRT-D device in place.  He has had VT noted on device but this does not clearly correlate with syncope.  He has presyncope with coughing spells but other syncopal events at random times.  ?vasovagal.   Followup in office in 4 wks.   Total time spent 45 minutes. Over half that time spent discussing above.   Shaune Pascal Esteen Delpriore MD 05/05/2014

## 2014-05-05 NOTE — Patient Instructions (Signed)
Labs today  Your physician recommends that you schedule a follow-up appointment in: 1 month  

## 2014-05-09 ENCOUNTER — Telehealth: Payer: Self-pay | Admitting: *Deleted

## 2014-05-09 ENCOUNTER — Ambulatory Visit (INDEPENDENT_AMBULATORY_CARE_PROVIDER_SITE_OTHER): Payer: Medicare Other | Admitting: Cardiology

## 2014-05-09 ENCOUNTER — Encounter: Payer: Self-pay | Admitting: Cardiology

## 2014-05-09 VITALS — BP 92/64 | HR 62 | Ht 65.0 in | Wt 167.8 lb

## 2014-05-09 DIAGNOSIS — I4891 Unspecified atrial fibrillation: Secondary | ICD-10-CM

## 2014-05-09 DIAGNOSIS — I5022 Chronic systolic (congestive) heart failure: Secondary | ICD-10-CM

## 2014-05-09 DIAGNOSIS — I2589 Other forms of chronic ischemic heart disease: Secondary | ICD-10-CM

## 2014-05-09 DIAGNOSIS — I255 Ischemic cardiomyopathy: Secondary | ICD-10-CM

## 2014-05-09 DIAGNOSIS — I2510 Atherosclerotic heart disease of native coronary artery without angina pectoris: Secondary | ICD-10-CM

## 2014-05-09 MED ORDER — APIXABAN 5 MG PO TABS: 5.0000 mg | ORAL_TABLET | Freq: Two times a day (BID) | ORAL | Status: DC

## 2014-05-09 NOTE — Patient Instructions (Signed)
Your physician wants you to follow-up in: [place] will receive a reminder letter in the mail two months in advance. If you don't receive a letter, please call our office to schedule the follow-up appointment.   STOP ASPIRIN  START ELIQUIS 5 MG ONE TABLET TWICE DAILY  Your physician recommends that you return for lab work in: [place]

## 2014-05-09 NOTE — Assessment & Plan Note (Signed)
Followed by electrophysiology. 

## 2014-05-09 NOTE — Progress Notes (Signed)
HPI: FU coronary artery disease status post coronary bypass graft, ischemic cardiomyopathy, chronic systolic CHF and hyperlipidemia. Patient had CRT upgrade in January 2014. Echocardiogram in August of 2014 showed an ejection fraction of 0000000, grade 2 diastolic dysfunction, mild left atrial enlargement, mild aortic and mitral regurgitation. Carotid Dopplers in May 2014 showed no significant stenosis. Patient has had recurrent syncope felt possibly cough mediated. His pacemaker was interrogated and showed no significant arrhythmias. Recent increase  In CHF symptoms and referred to CHF clinic. Cath 4/15 showed PCWP 29, occluded LAD, LCx, OM and probable RCA (unable to cannulate). LIMA to LAD, SVG to ramus, SVG to OM3 and SVG to PDA patent; RCA occluded prior to PDA; EF 15. Diuretics increased with improvement in symptoms. Since last seen, His dyspnea on exertion is much improved. He denies orthopnea, PND, pedal edema, syncope or chest pain.   Current Outpatient Prescriptions  Medication Sig Dispense Refill  . acetaminophen (TYLENOL) 500 MG tablet Take 500 mg by mouth every 6 (six) hours as needed. For pain      . albuterol (PROVENTIL HFA;VENTOLIN HFA) 108 (90 BASE) MCG/ACT inhaler Inhale 2 puffs into the lungs every 4 (four) hours as needed for wheezing.  1 Inhaler  6  . allopurinol (ZYLOPRIM) 100 MG tablet Take 1 tablet (100 mg total) by mouth daily.  30 tablet  6  . aspirin (BAYER ASPIRIN) 325 MG tablet Take 1 tablet (325 mg total) by mouth daily.  30 tablet  6  . calcium carbonate (OS-CAL) 600 MG TABS Take 600 mg by mouth daily.        . carvedilol (COREG) 3.125 MG tablet Take 1 tablet (3.125 mg total) by mouth 2 (two) times daily.  180 tablet  3  . digoxin (LANOXIN) 0.125 MG tablet Take 1 tablet (0.125 mg total) by mouth daily.  30 tablet  3  . famotidine (PEPCID) 10 MG tablet Take 10 mg by mouth daily.       Marland Kitchen FLUoxetine (PROZAC) 20 MG capsule Take 20 mg by mouth daily.      . furosemide  (LASIX) 80 MG tablet Take 1 tablet (80 mg total) by mouth 2 (two) times daily.  60 tablet  6  . insulin glargine (LANTUS) 100 UNIT/ML injection Inject 30-42 Units into the skin at bedtime. Sliding scale      . loratadine (CLARITIN) 10 MG tablet Take 10 mg by mouth daily as needed. For allergies      . metolazone (ZAROXOLYN) 2.5 MG tablet Take one tab (2.5 mg) on every Monday  30 tablet  3  . Multiple Vitamin (MULTIVITAMIN) tablet Take 1 tablet by mouth daily.       Marland Kitchen omega-3 acid ethyl esters (LOVAZA) 1 G capsule Take 2 g by mouth 2 (two) times daily.        . potassium chloride SA (K-DUR,KLOR-CON) 20 MEQ tablet Take 1 tablet (20 mEq total) by mouth daily. Take Additional tab (40 MeQtotal) on Metolazone Days (MON&FRI)  115 tablet  3  . pravastatin (PRAVACHOL) 80 MG tablet Take 1 tablet (80 mg total) by mouth every evening.  90 tablet  3  . ramipril (ALTACE) 2.5 MG capsule Take 2.5 mg by mouth daily.      Marland Kitchen spironolactone (ALDACTONE) 25 MG tablet Take 25 mg by mouth daily.         No current facility-administered medications for this visit.     Past Medical History  Diagnosis Date  .  Gout   . Ventricular tachycardia   . Ischemic heart disease   . Congestive heart failure   . Left bundle branch block   . Hyperlipidemia   . Pacemaker   . ICD (implantable cardiac defibrillator) in place   . Hypertension   . Heart murmur   . Anginal pain   . Myocardial infarction 2000; 2002; 2004; ~ 2006  . Pneumonia     "once, I think" (12/16/2012)  . Diabetes mellitus type II     IDDM , [facility]  . Gout     roast beef is a trigger    Past Surgical History  Procedure Laterality Date  . Coronary artery bypass graft  2000    CABG X4  . Urachal cyst excision  ~ 1995  . Vasectomy  ?1967  . A-v cardiac pacemaker insertion  2002; 2010  . Insert / replace / remove pacemaker  12/16/2012    LV lead placement; ICD assessment   . Tonsillectomy and adenoidectomy  1954  . Cardiac catheterization      "3 or 4;  never had interventions" (12/16/2012)    History   Social History  . Marital Status: Married    Spouse Name: N/A    Number of Children: N/A  . Years of Education: N/A   Occupational History  . Not on file.   Social History Main Topics  . Smoking status: Never Smoker   . Smokeless tobacco: Former Systems developer    Types: Snuff, Chew     Comment: 12/16/2012 "stopped chew/snuff in ~ 1992 after 15 yr"  . Alcohol Use: 4.2 oz/week    7 Shots of liquor per week     Comment: one shot a day  . Drug Use: No     Comment: none  . Sexual Activity: Yes   Other Topics Concern  . Not on file   Social History Narrative  . No narrative on file    ROS: no fevers or chills, productive cough, hemoptysis, dysphasia, odynophagia, melena, hematochezia, dysuria, hematuria, rash, seizure activity, orthopnea, PND, pedal edema, claudication. Remaining systems are negative.  Physical Exam: Well-developed well-nourished in no acute distress.  Skin is warm and dry.  HEENT is normal.  Neck is supple.  Chest is clear to auscultation with normal expansion.  Cardiovascular exam is regular rate and rhythm.  Abdominal exam nontender or distended. No masses palpated. Extremities show no edema. neuro grossly intact

## 2014-05-09 NOTE — Assessment & Plan Note (Signed)
Continue ACE inhibitor and beta blocker. I am hesitant to advance medications as his blood pressure is borderline.

## 2014-05-09 NOTE — Telephone Encounter (Signed)
PA to [facility] for eliquis

## 2014-05-09 NOTE — Assessment & Plan Note (Signed)
Discontinue aspirin given need for anticoagulation. Continue statin. 

## 2014-05-09 NOTE — Assessment & Plan Note (Signed)
Continue statin. 

## 2014-05-09 NOTE — Assessment & Plan Note (Signed)
Continue beta blocker. He has not had any syncope in the past 5 months. We will therefore resume anticoagulation. Begin apixaban 5 mg po BID; check hemoglobin and renal function in 4 weeks. Discontinue aspirin.

## 2014-05-09 NOTE — Assessment & Plan Note (Signed)
Much improved following increase dose diuretics. Continue present medications. He is now followed in the CHF clinic.

## 2014-05-09 NOTE — Assessment & Plan Note (Signed)
No recurrent episodes. 

## 2014-05-11 NOTE — Telephone Encounter (Signed)
eliquis approved from [facility], [facility] notified.

## 2014-05-12 ENCOUNTER — Encounter: Payer: Self-pay | Admitting: Cardiology

## 2014-05-30 NOTE — Progress Notes (Signed)
Patient ID: Mason Arnold, male   DOB: [birth year], 73 y.o.   MRN: [id number] PPCP: Dr. Linna Darner Primary cardiologist: Dr. Stanford Breed  73 yo with history of CAD s/p CABG, ischemic cardiomyopathy (20%) with St Jude  CRT-D, paroxysmal atrial fibrillation, and recurrent syncope of uncertain etiology presents for CHF clinic followup.  He had RHC/LHC in 4/15 showing patent grafts but elevated filling pressures and low cardiac index (1.9 Fick/2.2 thermo).  Since that time, he was started on twice weekly metolazone and digoxin.  Weight came down about 6 lbs. He saw Dr. Aundra Dubin 04/11/14 and metolazone cut back to weekly.   He returns for follow up. Last visit digoxin was restarted at 0.125 mg daily.  He has again complained of increased dizziness as he did before. He has required extra lasix about 3 days day a week 2 extra metolazone over the last month. Overall he feels good. Denies SOB/PND/Orthopnea. Able to walk up 3 steps. Weight at hoe 162-169 pounds. Able to walk 1/2 mile.  Plans to start  eliquis tomorrow. Following low salt diet and limiting fluid intake to < 2 liters per day.  RHC (4/15): mean RA 9, PA 61/24 (mean 38), mean PCWP 29, CI 1.9 Fick/2.2 thermo, PVR 2.7.   Had CPX 04/24/14: FVC 2.70 (78%)  FEV1 2.15 (86%)  FEV1/FVC 80%  MVV 84 (80%) Resting HR: 65 Peak HR: 134 (92% age predicted max HR) Peak VO2: 19.6 (76.1% predicted peak VO2) VE/VCO2 slope: 34.8  OUES: 1.58 Peak RER: 1.23 Ventilatory Threshold: 15.0 (58.3% predicted peak VO2) Peak RR 46 Peak Ventilation: 68.1 VE/MVV: 81% PETCO2 at peak: 30 O2pulse: 11 (76% predicted O2pulse)   Labs (4/15): K 4.7, creatinine 1.4 04/11/14: K 3.7 Cr 1.56 pBNP 1,353  PMH: 1. HTN 2. Hyperlipidemia 3. CAD: s/p CABG with LIMA-LAD, SVG-ramus, SVG-OM3, SVG-PDA.  LHC (4/15) with patent grafts.  4. Ischemic cardiomyopathy: Echo (8/14) with EF 20-25% with regional WMAs, mild MR, mildly decreased RV systolic function.  EF LV-gram (4/15) was 15%. He has a Armed forces training and education officer CRT-D device (upgraded 1/14).  RHC (4/15): mean RA 9, PA 61/24 (mean 38), mean PCWP 29, CI 1.9 Fick/2.2 thermo, PVR 2.7.  5. Syncopal episodes: Relatively frequent, etiology uncertain.  He does get lightheaded/near-syncopal with coughing spells, but he has syncope at other times also with no warning.  Since device has been in place, he has had VT detected but it has not been clearly correlated with syncopal episodes.  6. Atrial fibrillation: Paroxysmal.  Has not been anticoagulated due to relatively frequent syncope/falls.  7. Gout 8. H/o LBBB 9. Type II diabetes 10. CKD  SH: Married, lives in [place], nonsmoker, rare ETOH.   FH: CAD  ROS: All systems reviewed and negative except as per HPI.   Current Outpatient Prescriptions  Medication Sig Dispense Refill  . acetaminophen (TYLENOL) 500 MG tablet Take 500 mg by mouth every 6 (six) hours as needed. For pain      . albuterol (PROVENTIL HFA;VENTOLIN HFA) 108 (90 BASE) MCG/ACT inhaler Inhale 2 puffs into the lungs every 4 (four) hours as needed for wheezing.  1 Inhaler  6  . allopurinol (ZYLOPRIM) 100 MG tablet Take 1 tablet (100 mg total) by mouth daily.  30 tablet  6  . apixaban (ELIQUIS) 5 MG TABS tablet Take 1 tablet (5 mg total) by mouth 2 (two) times daily.  60 tablet  6  . calcium carbonate (OS-CAL) 600 MG TABS Take 600 mg by mouth daily.        Marland Kitchen  carvedilol (COREG) 3.125 MG tablet Take 1 tablet (3.125 mg total) by mouth 2 (two) times daily.  180 tablet  3  . digoxin (LANOXIN) 0.125 MG tablet Take 1 tablet (0.125 mg total) by mouth daily.  30 tablet  3  . famotidine (PEPCID) 10 MG tablet Take 10 mg by mouth daily.       Marland Kitchen FLUoxetine (PROZAC) 20 MG capsule Take 20 mg by mouth daily.      . furosemide (LASIX) 80 MG tablet Take 1 tablet (80 mg total) by mouth 2 (two) times daily.  60 tablet  6  . insulin glargine (LANTUS) 100 UNIT/ML injection Inject 30-42 Units into the skin at bedtime. Sliding scale      . loratadine (CLARITIN)  10 MG tablet Take 10 mg by mouth daily as needed. For allergies      . metolazone (ZAROXOLYN) 2.5 MG tablet Take 2.5 mg by mouth. Take 2.5 mg if wieght is 169 pounds.      . Multiple Vitamin (MULTIVITAMIN) tablet Take 1 tablet by mouth daily.       Marland Kitchen omega-3 acid ethyl esters (LOVAZA) 1 G capsule Take 2 g by mouth 2 (two) times daily.        . potassium chloride SA (K-DUR,KLOR-CON) 20 MEQ tablet Take 20 mEq by mouth daily. Take Additional tab (40 MeQtotal) if metolalzone is needed      . pravastatin (PRAVACHOL) 80 MG tablet Take 1 tablet (80 mg total) by mouth every evening.  90 tablet  3  . ramipril (ALTACE) 2.5 MG capsule Take 2.5 mg by mouth daily.      Marland Kitchen spironolactone (ALDACTONE) 25 MG tablet Take 25 mg by mouth daily.         No current facility-administered medications for this encounter.    BP 118/74  Pulse 75  Wt 167 lb (75.751 kg)  SpO2 94% General: NAD wife present  Neck: No JVD, no thyromegaly or thyroid nodule.  Lungs: Clear to auscultation bilaterally with normal respiratory effort. CV: Nondisplaced PMI.  Heart regular S1/S2, no S3/S4, no murmur.  No peripheral edema.  No carotid bruit.  Normal pedal pulses.  Abdomen: Soft, nontender, no hepatosplenomegaly, no distention.  Skin: Intact without lesions or rashes.  Neurologic: Alert and oriented x 3.  Psych: Normal affect. Extremities: No clubbing or cyanosis.  HEENT: Normal.   Assessment/Plan: 1. Chronic systolic CHF: Ischemic cardiomyopathy, EF 20-25% on last echo.  Has St Jude CRT-D device.  RHC (4/15) showed elevated filling pressures and low output.  Now with NYHA class II-III symptoms. --CPX testing looks better than I anticipated.  -Volume status stable. Continue lasix 80 mg twice a day and metolazone as needed for weight 169 or greater. Baseline target weight probably now 163-166. - BP up titration. Continue current ramipril, spironolactone, and Coreg.    - Digoxin restarted  At last visit but again he has  presyncope. Will stop today.  - Check BMET today.  2. CAD: Patent grafts on LHC in 4/15.  No chest pain.  Continue ASA, statin.  3. Paroxysmal atrial fibrillation: Regular rate today. Starting eliquis 5 mg tiwce a day. F/U CBC in 3 weeks. 4. Syncope: Long-standing history at this point, uncertain etiology.  Has CRT-D device in place.  He has had VT noted on device but this does not clearly correlate with syncope.  He has presyncope after restarting digoxin. Will stop again.   Follow up 2 months with Dr Haroldine Laws  CLEGG,AMY NP-C  05/31/2014

## 2014-05-31 ENCOUNTER — Ambulatory Visit (HOSPITAL_COMMUNITY)
Admission: RE | Admit: 2014-05-31 | Discharge: 2014-05-31 | Disposition: A | Discharge status: Home / Self Care | Payer: Medicare Other | Source: Ambulatory Visit | Attending: Adult Health | Admitting: Adult Health

## 2014-05-31 ENCOUNTER — Telehealth (HOSPITAL_COMMUNITY): Payer: Self-pay

## 2014-05-31 VITALS — BP 118/74 | HR 75 | Wt 167.0 lb

## 2014-05-31 DIAGNOSIS — N189 Chronic kidney disease, unspecified: Secondary | ICD-10-CM | POA: Diagnosis not present

## 2014-05-31 DIAGNOSIS — I480 Paroxysmal atrial fibrillation: Secondary | ICD-10-CM

## 2014-05-31 DIAGNOSIS — I2589 Other forms of chronic ischemic heart disease: Secondary | ICD-10-CM | POA: Insufficient documentation

## 2014-05-31 DIAGNOSIS — I5022 Chronic systolic (congestive) heart failure: Secondary | ICD-10-CM

## 2014-05-31 DIAGNOSIS — I4891 Unspecified atrial fibrillation: Secondary | ICD-10-CM

## 2014-05-31 DIAGNOSIS — E785 Hyperlipidemia, unspecified: Secondary | ICD-10-CM | POA: Diagnosis not present

## 2014-05-31 DIAGNOSIS — Z951 Presence of aortocoronary bypass graft: Secondary | ICD-10-CM | POA: Insufficient documentation

## 2014-05-31 DIAGNOSIS — I472 Ventricular tachycardia, unspecified: Secondary | ICD-10-CM

## 2014-05-31 DIAGNOSIS — I2510 Atherosclerotic heart disease of native coronary artery without angina pectoris: Secondary | ICD-10-CM | POA: Diagnosis not present

## 2014-05-31 DIAGNOSIS — Z7982 Long term (current) use of aspirin: Secondary | ICD-10-CM | POA: Insufficient documentation

## 2014-05-31 DIAGNOSIS — Z79899 Other long term (current) drug therapy: Secondary | ICD-10-CM | POA: Insufficient documentation

## 2014-05-31 DIAGNOSIS — E119 Type 2 diabetes mellitus without complications: Secondary | ICD-10-CM | POA: Diagnosis not present

## 2014-05-31 DIAGNOSIS — I129 Hypertensive chronic kidney disease with stage 1 through stage 4 chronic kidney disease, or unspecified chronic kidney disease: Secondary | ICD-10-CM | POA: Insufficient documentation

## 2014-05-31 DIAGNOSIS — I4729 Other ventricular tachycardia: Secondary | ICD-10-CM

## 2014-05-31 DIAGNOSIS — I509 Heart failure, unspecified: Secondary | ICD-10-CM | POA: Diagnosis not present

## 2014-05-31 DIAGNOSIS — R55 Syncope and collapse: Secondary | ICD-10-CM | POA: Diagnosis not present

## 2014-05-31 LAB — BASIC METABOLIC PANEL
BUN: 39 mg/dL — ABNORMAL HIGH (ref 6–23)
CALCIUM: 10.8 mg/dL — AB (ref 8.4–10.5)
CO2: 30 mEq/L (ref 19–32)
Chloride: 82 mEq/L — ABNORMAL LOW (ref 96–112)
Creatinine, Ser: 1.88 mg/dL — ABNORMAL HIGH (ref 0.50–1.35)
GFR, EST AFRICAN AMERICAN: 39 mL/min — AB (ref >90)
GFR, EST NON AFRICAN AMERICAN: 34 mL/min — AB (ref >90)
Glucose, Bld: 222 mg/dL — ABNORMAL HIGH (ref 70–99)
Potassium: 4.4 mEq/L (ref 3.7–5.3)
Sodium: 131 mEq/L — ABNORMAL LOW (ref 137–147)

## 2014-05-31 NOTE — Patient Instructions (Signed)
Follow up in 2 months  Stop taking digoxin   Do the following things EVERYDAY: 1) Weigh yourself in the morning before breakfast. Write it down and keep it in a log. 2) Take your medicines as prescribed 3) Eat low salt foods-Limit salt (sodium) to 2000 mg per day.  4) Stay as active as you can everyday Limit all fluids for the day to less than 2 liters

## 2014-05-31 NOTE — Telephone Encounter (Signed)
Lab results reviewed with patient.  Instructed to hold diuretics x 2 days, repeat lab work next week.  Aware and agreeable.

## 2014-06-07 ENCOUNTER — Other Ambulatory Visit: Payer: Self-pay | Admitting: Internal Medicine

## 2014-06-08 ENCOUNTER — Ambulatory Visit (HOSPITAL_COMMUNITY): Payer: Medicare Other

## 2014-06-08 ENCOUNTER — Other Ambulatory Visit (HOSPITAL_COMMUNITY): Payer: Self-pay | Admitting: Adult Health Nurse Practitioner

## 2014-06-08 DIAGNOSIS — J410 Simple chronic bronchitis: Secondary | ICD-10-CM

## 2014-06-14 ENCOUNTER — Other Ambulatory Visit: Payer: Self-pay

## 2014-06-14 ENCOUNTER — Telehealth: Payer: Self-pay | Admitting: *Deleted

## 2014-06-14 DIAGNOSIS — E119 Type 2 diabetes mellitus without complications: Secondary | ICD-10-CM

## 2014-06-14 MED ORDER — FLUOXETINE HCL 20 MG PO CAPS: 20.0000 mg | ORAL_CAPSULE | Freq: Every day | ORAL | Status: DC

## 2014-06-14 NOTE — Telephone Encounter (Signed)
Patient coming in for labs A1c, lipid, bmet - ordered diabetic bundle CBC - ordered per Dr Jacalyn Lefevre office visit note 05/09/14

## 2014-06-14 NOTE — Telephone Encounter (Signed)
OK X1 

## 2014-06-15 ENCOUNTER — Other Ambulatory Visit (INDEPENDENT_AMBULATORY_CARE_PROVIDER_SITE_OTHER): Payer: Medicare Other

## 2014-06-15 DIAGNOSIS — E119 Type 2 diabetes mellitus without complications: Secondary | ICD-10-CM

## 2014-06-15 LAB — LIPID PANEL
CHOL/HDL RATIO: 8
Cholesterol: 276 mg/dL — ABNORMAL HIGH (ref 0–200)
HDL: 33 mg/dL — AB (ref >39.00)
LDL CALC: 172 mg/dL — AB (ref 0–99)
NonHDL: 243
Triglycerides: 353 mg/dL — ABNORMAL HIGH (ref 0.0–149.0)
VLDL: 70.6 mg/dL — ABNORMAL HIGH (ref 0.0–40.0)

## 2014-06-15 LAB — BASIC METABOLIC PANEL
BUN: 27 mg/dL — ABNORMAL HIGH (ref 6–23)
CHLORIDE: 90 meq/L — AB (ref 96–112)
CO2: 36 meq/L — AB (ref 19–32)
Calcium: 9.4 mg/dL (ref 8.4–10.5)
Creatinine, Ser: 1.6 mg/dL — ABNORMAL HIGH (ref 0.4–1.5)
GFR: 44.44 mL/min — ABNORMAL LOW (ref >60.00)
Glucose, Bld: 149 mg/dL — ABNORMAL HIGH (ref 70–99)
Potassium: 3.6 mEq/L (ref 3.5–5.1)
SODIUM: 134 meq/L — AB (ref 135–145)

## 2014-06-15 LAB — CBC
HCT: 45.1 % (ref 39.0–52.0)
Hemoglobin: 15.7 g/dL (ref 13.0–17.0)
MCHC: 34.8 g/dL (ref 30.0–36.0)
MCV: 88.6 fl (ref 78.0–100.0)
PLATELETS: 289 10*3/uL (ref 150.0–400.0)
RBC: 5.1 Mil/uL (ref 4.22–5.81)
RDW: 16.7 % — ABNORMAL HIGH (ref 11.5–15.5)
WBC: 12.6 10*3/uL — ABNORMAL HIGH (ref 4.0–10.5)

## 2014-06-15 LAB — HEMOGLOBIN A1C: HEMOGLOBIN A1C: 6.6 % — AB (ref 4.6–6.5)

## 2014-07-12 ENCOUNTER — Other Ambulatory Visit: Payer: Self-pay | Admitting: Internal Medicine

## 2014-07-13 ENCOUNTER — Other Ambulatory Visit: Payer: Self-pay

## 2014-07-13 MED ORDER — FLUOXETINE HCL 20 MG PO CAPS: 20.0000 mg | ORAL_CAPSULE | Freq: Every day | ORAL | Status: DC

## 2014-07-13 NOTE — Telephone Encounter (Signed)
Pt called stated that insurance is not going to pay for 90 day supply, but they will pay for 30 day supply. Pt will be out this Monday. Please advise.

## 2014-07-13 NOTE — Telephone Encounter (Signed)
OK X1 

## 2014-07-13 NOTE — Telephone Encounter (Signed)
LAST REFILLED 7/8 LAST OFFICE VISIT 05/03/14

## 2014-07-21 ENCOUNTER — Encounter: Payer: Self-pay | Admitting: *Deleted

## 2014-07-31 ENCOUNTER — Ambulatory Visit (HOSPITAL_COMMUNITY)
Admission: RE | Admit: 2014-07-31 | Discharge: 2014-07-31 | Disposition: A | Discharge status: Home / Self Care | Payer: Medicare Other | Source: Ambulatory Visit | Attending: Cardiology | Admitting: Cardiology

## 2014-07-31 ENCOUNTER — Encounter (HOSPITAL_COMMUNITY): Payer: Self-pay

## 2014-07-31 VITALS — BP 112/80 | HR 95 | Resp 18 | Wt 175.2 lb

## 2014-07-31 DIAGNOSIS — Z8249 Family history of ischemic heart disease and other diseases of the circulatory system: Secondary | ICD-10-CM | POA: Insufficient documentation

## 2014-07-31 DIAGNOSIS — I2510 Atherosclerotic heart disease of native coronary artery without angina pectoris: Secondary | ICD-10-CM | POA: Insufficient documentation

## 2014-07-31 DIAGNOSIS — I5022 Chronic systolic (congestive) heart failure: Secondary | ICD-10-CM | POA: Insufficient documentation

## 2014-07-31 DIAGNOSIS — E119 Type 2 diabetes mellitus without complications: Secondary | ICD-10-CM | POA: Insufficient documentation

## 2014-07-31 DIAGNOSIS — N189 Chronic kidney disease, unspecified: Secondary | ICD-10-CM | POA: Diagnosis not present

## 2014-07-31 DIAGNOSIS — I447 Left bundle-branch block, unspecified: Secondary | ICD-10-CM | POA: Diagnosis not present

## 2014-07-31 DIAGNOSIS — I2589 Other forms of chronic ischemic heart disease: Secondary | ICD-10-CM | POA: Diagnosis not present

## 2014-07-31 DIAGNOSIS — I255 Ischemic cardiomyopathy: Secondary | ICD-10-CM

## 2014-07-31 DIAGNOSIS — Z794 Long term (current) use of insulin: Secondary | ICD-10-CM | POA: Diagnosis not present

## 2014-07-31 DIAGNOSIS — R55 Syncope and collapse: Secondary | ICD-10-CM | POA: Insufficient documentation

## 2014-07-31 DIAGNOSIS — I10 Essential (primary) hypertension: Secondary | ICD-10-CM

## 2014-07-31 DIAGNOSIS — I480 Paroxysmal atrial fibrillation: Secondary | ICD-10-CM

## 2014-07-31 DIAGNOSIS — I509 Heart failure, unspecified: Secondary | ICD-10-CM | POA: Diagnosis not present

## 2014-07-31 DIAGNOSIS — I129 Hypertensive chronic kidney disease with stage 1 through stage 4 chronic kidney disease, or unspecified chronic kidney disease: Secondary | ICD-10-CM | POA: Diagnosis not present

## 2014-07-31 DIAGNOSIS — M109 Gout, unspecified: Secondary | ICD-10-CM | POA: Insufficient documentation

## 2014-07-31 DIAGNOSIS — E785 Hyperlipidemia, unspecified: Secondary | ICD-10-CM | POA: Insufficient documentation

## 2014-07-31 DIAGNOSIS — I4891 Unspecified atrial fibrillation: Secondary | ICD-10-CM | POA: Diagnosis not present

## 2014-07-31 LAB — BASIC METABOLIC PANEL
Anion gap: 17 — ABNORMAL HIGH (ref 5–15)
BUN: 34 mg/dL — AB (ref 6–23)
CO2: 31 mEq/L (ref 19–32)
Calcium: 10.3 mg/dL (ref 8.4–10.5)
Chloride: 91 mEq/L — ABNORMAL LOW (ref 96–112)
Creatinine, Ser: 1.6 mg/dL — ABNORMAL HIGH (ref 0.50–1.35)
GFR, EST AFRICAN AMERICAN: 47 mL/min — AB (ref >90)
GFR, EST NON AFRICAN AMERICAN: 41 mL/min — AB (ref >90)
Glucose, Bld: 146 mg/dL — ABNORMAL HIGH (ref 70–99)
POTASSIUM: 4.8 meq/L (ref 3.7–5.3)
SODIUM: 139 meq/L (ref 137–147)

## 2014-07-31 LAB — PRO B NATRIURETIC PEPTIDE: Pro B Natriuretic peptide (BNP): 724 pg/mL — ABNORMAL HIGH (ref 0–125)

## 2014-07-31 NOTE — Patient Instructions (Signed)
Doing well.  Continue medications as prescribed.  Call any issues.  Follow up in 2 months with ECHO  Do the following things EVERYDAY: 1) Weigh yourself in the morning before breakfast. Write it down and keep it in a log. 2) Take your medicines as prescribed 3) Eat low salt foods-Limit salt (sodium) to 2000 mg per day.  4) Stay as active as you can everyday 5) Limit all fluids for the day to less than 2 liters 6)

## 2014-07-31 NOTE — Progress Notes (Signed)
Patient ID: Mason Arnold, male   DOB: 1941/12/04, 73 y.o.   MRN: [id number]  PCP: Dr. Linna Darner Primary cardiologist: Dr. Stanford Breed  73 yo with history of CAD s/p CABG, ischemic cardiomyopathy (20%) with St Jude  CRT-D, paroxysmal atrial fibrillation, and recurrent syncope of uncertain etiology presents for CHF clinic followup.  He had RHC/LHC in 4/15 showing patent grafts but elevated filling pressures and low cardiac index (1.9 Fick/2.2 thermo).  Since that time, he was started on twice weekly metolazone and digoxin.  Weight came down about 6 lbs. He saw Dr. Aundra Dubin 04/11/14 and metolazone cut back to weekly.   Follow up for Heart Failure:  Doing well. Denies SOB, orthopnea, PND or CP. One episode of "zoning" where he goes unresponsive fore few minutes and feels like he is about pass out. Everything goes black but no fall. Weight at home 164-177 lbs. Has need metolazone about 8 times. SBP 90-120s. Blood sugars controlled 90-120s. No bleeding issues with Eliquis. On average able to walk about 1/4-1/2 mile. Following a low salt diet and drinking less than 2L a day. Has had issues with gout and has had to use a walker a few days.    RHC (4/15): mean RA 9, PA 61/24 (mean 38), mean PCWP 29, CI 1.9 Fick/2.2 thermo, PVR 2.7.   Had CPX 04/24/14: FVC 2.70 (78%)  FEV1 2.15 (86%)  FEV1/FVC 80%  MVV 84 (80%) Resting HR: 65 Peak HR: 134 (92% age predicted max HR) Peak VO2: 19.6 (76.1% predicted peak VO2) VE/VCO2 slope: 34.8 OUES: 1.58 Peak RER: 1.23 Ventilatory Threshold: 15.0 (58.3% predicted peak VO2) Peak RR 46 Peak Ventilation: 68.1 VE/MVV: 81% PETCO2 at peak: 30 O2pulse: 11 (76% predicted O2pulse)  Labs (4/15): K 4.7, creatinine 1.4          (5/15): K 3.7 Cr 1.56 pBNP 1,353  PMH: 1. HTN 2. Hyperlipidemia 3. CAD: s/p CABG with LIMA-LAD, SVG-ramus, SVG-OM3, SVG-PDA.  LHC (4/15) with patent grafts.  4. Ischemic cardiomyopathy: Echo (8/14) with EF 20-25% with regional WMAs, mild MR, mildly  decreased RV systolic function.  EF LV-gram (4/15) was 15%. He has a Research officer, political party CRT-D device (upgraded 1/14).  RHC (4/15): mean RA 9, PA 61/24 (mean 38), mean PCWP 29, CI 1.9 Fick/2.2 thermo, PVR 2.7. CPX (04/2014): Peak VO2: 19.6 (76% predicted peak VO2), VE/VCO2: 34.8, Peak RER 1.23 5. Syncopal episodes: Relatively frequent, etiology uncertain.  He does get lightheaded/near-syncopal with coughing spells, but he has syncope at other times also with no warning.  Since device has been in place, he has had VT detected but it has not been clearly correlated with syncopal episodes.  6. Atrial fibrillation: Paroxysmal.  Has not been anticoagulated due to relatively frequent syncope/falls.  7. Gout 8. H/o LBBB 9. Type II diabetes 10. CKD  SH: Married, lives in [place], nonsmoker, rare ETOH.   FH: CAD  ROS: All systems reviewed and negative except as per HPI.   Current Outpatient Prescriptions  Medication Sig Dispense Refill  . acetaminophen (TYLENOL) 500 MG tablet Take 500 mg by mouth every 6 (six) hours as needed. For pain      . albuterol (PROVENTIL HFA;VENTOLIN HFA) 108 (90 BASE) MCG/ACT inhaler Inhale 2 puffs into the lungs every 4 (four) hours as needed for wheezing.  1 Inhaler  6  . allopurinol (ZYLOPRIM) 100 MG tablet Take 1 tablet (100 mg total) by mouth daily.  30 tablet  6  . apixaban (ELIQUIS) 5 MG TABS tablet Take 1 tablet (  5 mg total) by mouth 2 (two) times daily.  60 tablet  6  . calcium carbonate (OS-CAL) 600 MG TABS Take 600 mg by mouth daily.        . carvedilol (COREG) 3.125 MG tablet Take 1 tablet (3.125 mg total) by mouth 2 (two) times daily.  180 tablet  3  . famotidine (PEPCID) 10 MG tablet Take 10 mg by mouth daily.       Marland Kitchen FLUoxetine (PROZAC) 20 MG capsule Take 1 capsule (20 mg total) by mouth daily.  30 capsule  0  . furosemide (LASIX) 80 MG tablet Take 1 tablet (80 mg total) by mouth 2 (two) times daily.  60 tablet  6  . insulin glargine (LANTUS) 100 UNIT/ML injection  Inject 30-42 Units into the skin at bedtime. Sliding scale      . loratadine (CLARITIN) 10 MG tablet Take 10 mg by mouth daily as needed. For allergies      . metolazone (ZAROXOLYN) 2.5 MG tablet Take 2.5 mg by mouth. Take 2.5 mg if wieght is 169 pounds.      . Multiple Vitamin (MULTIVITAMIN) tablet Take 1 tablet by mouth daily.       Marland Kitchen omega-3 acid ethyl esters (LOVAZA) 1 G capsule Take 2 g by mouth 2 (two) times daily.        . potassium chloride SA (K-DUR,KLOR-CON) 20 MEQ tablet Take 20 mEq by mouth daily. Take Additional tab (40 MeQtotal) if metolalzone is needed      . pravastatin (PRAVACHOL) 80 MG tablet Take 1 tablet (80 mg total) by mouth every evening.  90 tablet  3  . ramipril (ALTACE) 2.5 MG capsule TAKE 1 CAPSULE BY MOUTH ONCE A DAY  90 capsule  5  . spironolactone (ALDACTONE) 25 MG tablet Take 25 mg by mouth daily.         No current facility-administered medications for this encounter.    BP 112/80  Pulse 95  Resp 18  Wt 175 lb 4 oz (79.493 kg)  SpO2 96% General: NAD wife present  Neck: No JVD, no thyromegaly or thyroid nodule.  Lungs: Clear to auscultation bilaterally with normal respiratory effort. CV: Nondisplaced PMI.  Heart regular S1/S2, no S3/S4, no murmur.  No peripheral edema.  No carotid bruit.  Normal pedal pulses.  Abdomen: Soft, nontender, no hepatosplenomegaly, no distention.  Skin: Intact without lesions or rashes.  Neurologic: Alert and oriented x 3.  Psych: Normal affect. Extremities: No clubbing or cyanosis.  HEENT: Normal.   Assessment/Plan:  1. Chronic systolic CHF: Ischemic cardiomyopathy s/p St Jude CRT-D, EF 20-25% (07/2013).   - NYHA II symptoms and volume status stable. Will continue lasix 80 mg BID and metolazone as needed for weight 169 lbs or greater. Check BMET and pro-BNP today.  - Continue current ramipril, spironolactone, and Coreg. Patients SBP at home 90-110 will not titrate currently.  - Not on digoxin d/t pre-syncope after starting.   - Reinforced the need and importance of daily weights, a low sodium diet, and fluid restriction (less than 2 L a day). Instructed to call the HF clinic if weight increases more than 3 lbs overnight or 5 lbs in a week.  2. CAD: Patent grafts on LHC in 4/15.  No s/s of ischemia. Continue ASA, statin, BB and ACE-I. 3. Paroxysmal atrial fibrillation: Appears to be in NSR today. Continue eliquis 5 mg tiwce a day.  4. Syncope: Long-standing history at this point, uncertain etiology.  Has CRT-D device  in place.    Follow up 2 months with ECHO  Junie Bame B NP-C  07/31/2014

## 2014-08-09 ENCOUNTER — Other Ambulatory Visit: Payer: Self-pay | Admitting: Internal Medicine

## 2014-08-09 NOTE — Telephone Encounter (Signed)
OK X1 

## 2014-08-09 NOTE — Telephone Encounter (Signed)
Med last filled 07/13/14 Last office visit 05/03/14

## 2014-08-17 ENCOUNTER — Ambulatory Visit (INDEPENDENT_AMBULATORY_CARE_PROVIDER_SITE_OTHER): Payer: Medicare Other | Admitting: Internal Medicine

## 2014-08-17 ENCOUNTER — Encounter: Payer: Self-pay | Admitting: Internal Medicine

## 2014-08-17 VITALS — BP 100/72 | HR 74 | Ht 65.0 in | Wt 176.0 lb

## 2014-08-17 DIAGNOSIS — I5022 Chronic systolic (congestive) heart failure: Secondary | ICD-10-CM

## 2014-08-17 DIAGNOSIS — I472 Ventricular tachycardia, unspecified: Secondary | ICD-10-CM

## 2014-08-17 DIAGNOSIS — Z95810 Presence of automatic (implantable) cardiac defibrillator: Secondary | ICD-10-CM

## 2014-08-17 DIAGNOSIS — I4729 Other ventricular tachycardia: Secondary | ICD-10-CM

## 2014-08-17 DIAGNOSIS — I4891 Unspecified atrial fibrillation: Secondary | ICD-10-CM

## 2014-08-17 DIAGNOSIS — I2589 Other forms of chronic ischemic heart disease: Secondary | ICD-10-CM

## 2014-08-17 DIAGNOSIS — I255 Ischemic cardiomyopathy: Secondary | ICD-10-CM

## 2014-08-17 DIAGNOSIS — I480 Paroxysmal atrial fibrillation: Secondary | ICD-10-CM

## 2014-08-17 LAB — MDC_IDC_ENUM_SESS_TYPE_INCLINIC
Battery Remaining Longevity: 28.8 mo
Brady Statistic RA Percent Paced: 88 %
Brady Statistic RV Percent Paced: 98 %
Date Time Interrogation Session: 20150910120542
HIGH POWER IMPEDANCE MEASURED VALUE: 48.2813
Implantable Pulse Generator Serial Number: 733092
Lead Channel Impedance Value: 550 Ohm
Lead Channel Impedance Value: 775 Ohm
Lead Channel Pacing Threshold Amplitude: 1 V
Lead Channel Pacing Threshold Amplitude: 1 V
Lead Channel Pacing Threshold Amplitude: 1.125 V
Lead Channel Pacing Threshold Pulse Width: 0.4 ms
Lead Channel Pacing Threshold Pulse Width: 0.4 ms
Lead Channel Pacing Threshold Pulse Width: 0.5 ms
Lead Channel Setting Pacing Amplitude: 2.125
Lead Channel Setting Pacing Amplitude: 3 V
Lead Channel Setting Pacing Pulse Width: 0.5 ms
MDC IDC MSMT LEADCHNL RA IMPEDANCE VALUE: 512.5 Ohm
MDC IDC MSMT LEADCHNL RA SENSING INTR AMPL: 5 mV
MDC IDC MSMT LEADCHNL RV PACING THRESHOLD AMPLITUDE: 1 V
MDC IDC MSMT LEADCHNL RV PACING THRESHOLD AMPLITUDE: 1 V
MDC IDC MSMT LEADCHNL RV PACING THRESHOLD PULSEWIDTH: 0.8 ms
MDC IDC MSMT LEADCHNL RV PACING THRESHOLD PULSEWIDTH: 0.8 ms
MDC IDC MSMT LEADCHNL RV SENSING INTR AMPL: 12 mV
MDC IDC SET LEADCHNL RA PACING AMPLITUDE: 2 V
MDC IDC SET LEADCHNL RV PACING PULSEWIDTH: 0.8 ms
MDC IDC SET LEADCHNL RV SENSING SENSITIVITY: 0.5 mV
MDC IDC SET ZONE DETECTION INTERVAL: 285 ms
MDC IDC SET ZONE DETECTION INTERVAL: 375 ms
Zone Setting Detection Interval: 250 ms

## 2014-08-17 NOTE — Progress Notes (Signed)
Mason Arnold Patient Care Team: Hendricks Limes, MD as PCP - General   HPI  Mason Arnold is a 73 y.o. male he is seen in followup for ischemic cardiomyopathy status post coronary bypass graft, he is status post ICD implantation; underwent generator replacement in October 2010.  His last Myoview was performed in July of 2011. At that time, he had an ejection fraction of 28%. There was a previous infarct involving the anterior wall, apex, and inferior wall with minimal ischemia noted  He underwent catheterization 4/15 with patent grafts.  Echocardiogram in July of 2013 showed an ejection fraction of 20-25%. There was mild left ventricular enlargement, Moderate to severe left atrial enlargement and mildly reduced RV function  He has dyspnea on exertion wth LBBB at 146 msec . January 2014 he underwent CRT upgrade   He has been followed by CHF w discussion of VAD support  He is disinclined as he feels it is being pushed on him  He has had 2 episodes of syncope. One was preceded by a cough which his wife recognizes as an impending syncopal event. He gets very red and wakes up about 45 seconds. No arrhythmia was detected on his monitor. The other happened similarly following drinking coffee hyperextending his neck    Interrogation of his device demonstrated one episode of VT-NS but we are not sure if it is the same day as his syncope      Past Medical History  Diagnosis Date  . Gout   . Ventricular tachycardia   . Ischemic heart disease   . Congestive heart failure   . Left bundle branch block   . Hyperlipidemia   . Pacemaker   . ICD (implantable cardiac defibrillator) in place   . Hypertension   . Heart murmur   . Anginal pain   . Myocardial infarction 2000; 2002; 2004; ~ 2006  . Pneumonia     "once, I think" (12/16/2012)  . Diabetes mellitus type II     IDDM , [facility]  . Gout     roast beef is a trigger    Past Surgical History  Procedure Laterality Date  . Coronary artery bypass  graft  2000    CABG X4  . Urachal cyst excision  ~ 1995  . Vasectomy  ?1967  . A-v cardiac pacemaker insertion  2002; 2010  . Insert / replace / remove pacemaker  12/16/2012    LV lead placement; ICD assessment   . Tonsillectomy and adenoidectomy  1954  . Cardiac catheterization      "3 or 4; never had interventions" (12/16/2012)    Current Outpatient Prescriptions  Medication Sig Dispense Refill  . acetaminophen (TYLENOL) 500 MG tablet Take 500 mg by mouth every 6 (six) hours as needed. For pain      . albuterol (PROVENTIL HFA;VENTOLIN HFA) 108 (90 BASE) MCG/ACT inhaler Inhale 2 puffs into the lungs every 4 (four) hours as needed for wheezing.  1 Inhaler  6  . allopurinol (ZYLOPRIM) 100 MG tablet Take 1 tablet (100 mg total) by mouth daily.  30 tablet  6  . apixaban (ELIQUIS) 5 MG TABS tablet Take 1 tablet (5 mg total) by mouth 2 (two) times daily.  60 tablet  6  . calcium carbonate (OS-CAL) 600 MG TABS Take 600 mg by mouth daily.        . carvedilol (COREG) 3.125 MG tablet Take 1 tablet (3.125 mg total) by mouth 2 (two) times daily.  180 tablet  3  .  famotidine (PEPCID) 10 MG tablet Take 10 mg by mouth daily.       Marland Kitchen FLUoxetine (PROZAC) 20 MG capsule TAKE 1 CAPSULE BY MOUTH DAILY.  30 capsule  0  . furosemide (LASIX) 80 MG tablet Take 1 tablet (80 mg total) by mouth 2 (two) times daily.  60 tablet  6  . insulin glargine (LANTUS) 100 UNIT/ML injection Inject 30-42 Units into the skin at bedtime. Sliding scale      . loratadine (CLARITIN) 10 MG tablet Take 10 mg by mouth daily as needed. For allergies      . metolazone (ZAROXOLYN) 2.5 MG tablet Take 2.5 mg by mouth. Take 2.5 mg if wieght is 169 pounds.      . Multiple Vitamin (MULTIVITAMIN) tablet Take 1 tablet by mouth daily.       Marland Kitchen omega-3 acid ethyl esters (LOVAZA) 1 G capsule Take 2 g by mouth 2 (two) times daily.        . potassium chloride SA (K-DUR,KLOR-CON) 20 MEQ tablet Take 20 mEq by mouth daily. Take Additional tab (40 MeQtotal)  if metolalzone is needed      . pravastatin (PRAVACHOL) 80 MG tablet Take 1 tablet (80 mg total) by mouth every evening.  90 tablet  3  . ramipril (ALTACE) 2.5 MG capsule TAKE 1 CAPSULE BY MOUTH ONCE A DAY  90 capsule  5  . spironolactone (ALDACTONE) 25 MG tablet Take 25 mg by mouth daily.         No current facility-administered medications for this visit.    Allergies  Allergen Reactions  . Ampicillin Rash  . Crestor [Rosuvastatin Calcium] Rash and Other (See Comments)    Rash as nodules ("like strawberries")  . Orange Fruit [Citrus] Swelling and Other (See Comments)    Lips swelling, severe headache   . Propoxyphene N-Acetaminophen Other (See Comments)    hallucinations  [darvicet]    Review of Systems negative except from HPI and PMH  Physical Exam BP 100/72  Pulse 74  Ht 5\' 5"  (1.651 m)  Wt 176 lb (79.833 kg)  BMI 29.29 kg/m2 Well developed and nourished in no acute distress HENT normal Neck supple with JVP-flat Clear Regular rate and rhythm, no murmurs or gallops Abd-soft with active BS No Clubbing cyanosis edema Skin-warm and dry A & Oriented  Grossly normal sensory and motor function     Assessment and  Plan  We had a lengthy discussion regarding the role of advanced therapies and their potential benefit;  there was a fair amount of frustration as he felt like he was being pushed into this therapy prior to his being indicated.  I explained    there has been significant benefit for some patients with their LVAD the time is something he should discuss specifically with Dr. Reine Just and he should make him aware of his opennes  to Dr. Lyman Bishop recommendations as to timing.  We spent about 45 minutes discussing these issues. He and his wife were appreciated at the end.  In addition we discussed the syncope  The absence of arrhythmia documented by his device, is almost certainly vasomotor. The first episode could have been cough syncope. The second possibly deglutition  syncope although this is primarily a cardio inhibition. Hyperextension of his neck may also be related more directly to cerebral blood flow.  Unfortunately I had no specific therapeutic recommendations.

## 2014-08-17 NOTE — Patient Instructions (Addendum)
Your physician recommends that you continue on your current medications as directed. Please refer to the Current Medication list given to you today.  Remote monitoring is used to monitor your ICD from home. This monitoring reduces the number of office visits required to check your device to one time per year. It allows [place] to keep an eye on the functioning of your device to ensure it is working properly. You are scheduled for a device check from home on 11-20-2014. You may send your transmission at any time that day. If you have a wireless device, the transmission will be sent automatically. After your physician reviews your transmission, you will receive a postcard with your next transmission date.  Your physician recommends that you schedule a follow-up appointment in: 12 months with Dr.Klein

## 2014-08-21 ENCOUNTER — Encounter: Payer: Self-pay | Admitting: Internal Medicine

## 2014-08-26 ENCOUNTER — Encounter: Payer: Self-pay | Admitting: Internal Medicine

## 2014-09-11 ENCOUNTER — Other Ambulatory Visit: Payer: Self-pay

## 2014-09-11 MED ORDER — FLUOXETINE HCL 20 MG PO CAPS: ORAL_CAPSULE | ORAL | Status: DC

## 2014-09-11 NOTE — Telephone Encounter (Signed)
OK X 3 mos 

## 2014-09-14 ENCOUNTER — Telehealth: Payer: Self-pay

## 2014-09-14 NOTE — Telephone Encounter (Signed)
Has not had an eye exam this year, but plans to schedule one.

## 2014-09-28 ENCOUNTER — Ambulatory Visit (INDEPENDENT_AMBULATORY_CARE_PROVIDER_SITE_OTHER): Payer: Medicare Other

## 2014-09-28 DIAGNOSIS — Z23 Encounter for immunization: Secondary | ICD-10-CM

## 2014-10-02 ENCOUNTER — Ambulatory Visit (HOSPITAL_BASED_OUTPATIENT_CLINIC_OR_DEPARTMENT_OTHER)
Admission: RE | Admit: 2014-10-02 | Discharge: 2014-10-02 | Disposition: A | Discharge status: Home / Self Care | Payer: Medicare Other | Source: Ambulatory Visit | Attending: Internal Medicine | Admitting: Internal Medicine

## 2014-10-02 ENCOUNTER — Ambulatory Visit (HOSPITAL_COMMUNITY)
Admission: RE | Admit: 2014-10-02 | Discharge: 2014-10-02 | Disposition: A | Discharge status: Home / Self Care | Payer: Medicare Other | Source: Ambulatory Visit | Attending: Internal Medicine | Admitting: Internal Medicine

## 2014-10-02 VITALS — BP 106/62 | HR 75 | Wt 173.2 lb

## 2014-10-02 DIAGNOSIS — I129 Hypertensive chronic kidney disease with stage 1 through stage 4 chronic kidney disease, or unspecified chronic kidney disease: Secondary | ICD-10-CM | POA: Diagnosis not present

## 2014-10-02 DIAGNOSIS — E785 Hyperlipidemia, unspecified: Secondary | ICD-10-CM | POA: Insufficient documentation

## 2014-10-02 DIAGNOSIS — N189 Chronic kidney disease, unspecified: Secondary | ICD-10-CM | POA: Diagnosis not present

## 2014-10-02 DIAGNOSIS — I255 Ischemic cardiomyopathy: Secondary | ICD-10-CM | POA: Insufficient documentation

## 2014-10-02 DIAGNOSIS — R55 Syncope and collapse: Secondary | ICD-10-CM | POA: Diagnosis not present

## 2014-10-02 DIAGNOSIS — I2510 Atherosclerotic heart disease of native coronary artery without angina pectoris: Secondary | ICD-10-CM

## 2014-10-02 DIAGNOSIS — I4891 Unspecified atrial fibrillation: Secondary | ICD-10-CM

## 2014-10-02 DIAGNOSIS — I5022 Chronic systolic (congestive) heart failure: Secondary | ICD-10-CM

## 2014-10-02 DIAGNOSIS — I480 Paroxysmal atrial fibrillation: Secondary | ICD-10-CM | POA: Diagnosis not present

## 2014-10-02 DIAGNOSIS — I359 Nonrheumatic aortic valve disorder, unspecified: Secondary | ICD-10-CM

## 2014-10-02 DIAGNOSIS — Z7901 Long term (current) use of anticoagulants: Secondary | ICD-10-CM | POA: Insufficient documentation

## 2014-10-02 DIAGNOSIS — Z951 Presence of aortocoronary bypass graft: Secondary | ICD-10-CM | POA: Insufficient documentation

## 2014-10-02 DIAGNOSIS — E119 Type 2 diabetes mellitus without complications: Secondary | ICD-10-CM | POA: Diagnosis not present

## 2014-10-02 LAB — BASIC METABOLIC PANEL
ANION GAP: 18 — AB (ref 5–15)
BUN: 50 mg/dL — ABNORMAL HIGH (ref 6–23)
CHLORIDE: 79 meq/L — AB (ref 96–112)
CO2: 32 meq/L (ref 19–32)
CREATININE: 1.85 mg/dL — AB (ref 0.50–1.35)
Calcium: 9.7 mg/dL (ref 8.4–10.5)
GFR calc Af Amer: 40 mL/min — ABNORMAL LOW (ref >90)
GFR calc non Af Amer: 34 mL/min — ABNORMAL LOW (ref >90)
Glucose, Bld: 197 mg/dL — ABNORMAL HIGH (ref 70–99)
Potassium: 3.5 mEq/L — ABNORMAL LOW (ref 3.7–5.3)
SODIUM: 129 meq/L — AB (ref 137–147)

## 2014-10-02 LAB — CBC
HCT: 43.6 % (ref 39.0–52.0)
HEMOGLOBIN: 16.4 g/dL (ref 13.0–17.0)
MCH: 32.4 pg (ref 26.0–34.0)
MCHC: 37 g/dL — AB (ref 30.0–36.0)
MCV: 85.2 fL (ref 78.0–100.0)
Platelets: 260 10*3/uL (ref 150–400)
RBC: 5.12 MIL/uL (ref 4.22–5.81)
RDW: 12.6 % (ref 11.5–15.5)
WBC: 13.5 10*3/uL — ABNORMAL HIGH (ref 4.0–10.5)

## 2014-10-02 LAB — PRO B NATRIURETIC PEPTIDE: Pro B Natriuretic peptide (BNP): 835.2 pg/mL — ABNORMAL HIGH (ref 0–125)

## 2014-10-02 MED ORDER — DIGOXIN 125 MCG PO TABS: 0.0625 mg | ORAL_TABLET | Freq: Every day | ORAL | Status: DC

## 2014-10-02 NOTE — Progress Notes (Signed)
Patient ID: Mason Arnold, male   DOB: 10/20/1941, 73 y.o.   MRN: [id number] PCP: Dr. Linna Darner Primary cardiologist: Dr. Stanford Breed  73 yo with history of CAD s/p CABG, ischemic cardiomyopathy (20%) with St Jude CRT-D, paroxysmal atrial fibrillation, and recurrent syncope of uncertain etiology presents for CHF clinic followup.  He had RHC/LHC in 4/15 showing patent grafts but elevated filling pressures and low cardiac index (1.9 Fick/2.2 thermo).  Since that time, he was started on twice weekly metolazone and digoxin.  Weight came down about 6 lbs. Currently, he is using metolazone as needed 1-2 times/week.  Overall stable.  He was started on Eliquis and has tolerated it.  No falls or syncope since last visit though he was lightheaded with coughing twice.  He was started on digoxin 0.125 mg daily twice and both times stopped it due to dizziness.  No arrhythmias documented by his CRT-D device.  His dyspnea varies, sometimes he will be short of breath walking 1 block.  Sometimes he can go farther.   He is short of breath after walking up about 1/2 a flight of steps and has to stop.  Stable 2 pillow orthopnea.  No chest pain.  No palpitations.    RHC (4/15): mean RA 9, PA 61/24 (mean 38), mean PCWP 29, CI 1.9 Fick/2.2 thermo, PVR 2.7.   CPX 04/24/14: FVC 2.70 (78%)  FEV1 2.15 (86%)  FEV1/FVC 80%  MVV 84 (80%) Resting HR: 65 Peak HR: 134 (92% age predicted max HR) Peak VO2: 19.6 (76.1% predicted peak VO2) VE/VCO2 slope: 34.8 OUES: 1.58 Peak RER: 1.23 Ventilatory Threshold: 15.0 (58.3% predicted peak VO2) Peak RR 46 Peak Ventilation: 68.1 VE/MVV: 81% PETCO2 at peak: 30 O2pulse: 11 (76% predicted O2pulse)  Labs (4/15): K 4.7, creatinine 1.4 Labs (5/15): K 3.7 Cr 1.56 pBNP 1,353 Labs (8/15): K 4.8, creatinine 1.6, pBNP 724  PMH: 1. HTN 2. Hyperlipidemia 3. CAD: s/p CABG with LIMA-LAD, SVG-ramus, SVG-OM3, SVG-PDA.  LHC (4/15) with patent grafts.  4. Ischemic cardiomyopathy: Echo (8/14) with EF  20-25% with regional WMAs, mild MR, mildly decreased RV systolic function.  EF LV-gram (4/15) was 15%. He has a Research officer, political party CRT-D device (upgraded 1/14).  RHC (4/15): mean RA 9, PA 61/24 (mean 38), mean PCWP 29, CI 1.9 Fick/2.2 thermo, PVR 2.7. CPX (04/2014): Peak VO2: 19.6 (76% predicted peak VO2), VE/VCO2: 34.8, Peak RER 1.23.  Echo (10/15) with EF 20-25% with regional wall motion abnormalities, normal RV size with mild to moderately decreased systolic function, mild AS, mild MR.  5. Syncopal episodes: Relatively frequent, etiology uncertain.  He does get lightheaded/near-syncopal with coughing spells, but he has syncope at other times also with no warning.  Since device has been in place, he has had VT detected but it has not been clearly correlated with syncopal episodes. Suspect vasomotor.  6. Atrial fibrillation: Paroxysmal.  Has not been anticoagulated due to relatively frequent syncope/falls.  7. Gout 8. H/o LBBB 9. Type II diabetes 10. CKD  SH: Married, lives in [place], nonsmoker, rare ETOH.   FH: CAD  ROS: All systems reviewed and negative except as per HPI.   Current Outpatient Prescriptions  Medication Sig Dispense Refill  . acetaminophen (TYLENOL) 500 MG tablet Take 500 mg by mouth every 6 (six) hours as needed. For pain      . albuterol (PROVENTIL HFA;VENTOLIN HFA) 108 (90 BASE) MCG/ACT inhaler Inhale 2 puffs into the lungs every 4 (four) hours as needed for wheezing.  1 Inhaler  6  .  allopurinol (ZYLOPRIM) 100 MG tablet Take 1 tablet (100 mg total) by mouth daily.  30 tablet  6  . apixaban (ELIQUIS) 5 MG TABS tablet Take 1 tablet (5 mg total) by mouth 2 (two) times daily.  60 tablet  6  . calcium carbonate (OS-CAL) 600 MG TABS Take 600 mg by mouth daily.        . carvedilol (COREG) 3.125 MG tablet Take 1 tablet (3.125 mg total) by mouth 2 (two) times daily.  180 tablet  3  . famotidine (PEPCID) 10 MG tablet Take 10 mg by mouth daily.       Marland Kitchen FLUoxetine (PROZAC) 20 MG capsule TAKE  1 CAPSULE BY MOUTH DAILY.  90 capsule  0  . furosemide (LASIX) 80 MG tablet Take 1 tablet (80 mg total) by mouth 2 (two) times daily.  60 tablet  6  . insulin glargine (LANTUS) 100 UNIT/ML injection Inject 30-42 Units into the skin at bedtime. Sliding scale      . loratadine (CLARITIN) 10 MG tablet Take 10 mg by mouth daily as needed. For allergies      . metolazone (ZAROXOLYN) 2.5 MG tablet Take 2.5 mg by mouth. Take 2.5 mg if wieght is 169 pounds.      . Multiple Vitamin (MULTIVITAMIN) tablet Take 1 tablet by mouth daily.       Marland Kitchen omega-3 acid ethyl esters (LOVAZA) 1 G capsule Take 2 g by mouth 2 (two) times daily.        . potassium chloride SA (K-DUR,KLOR-CON) 20 MEQ tablet Take 20 mEq by mouth daily. Take Additional tab (40 MeQtotal) if metolalzone is needed      . pravastatin (PRAVACHOL) 80 MG tablet Take 1 tablet (80 mg total) by mouth every evening.  90 tablet  3  . ramipril (ALTACE) 2.5 MG capsule TAKE 1 CAPSULE BY MOUTH ONCE A DAY  90 capsule  5  . spironolactone (ALDACTONE) 25 MG tablet Take 25 mg by mouth daily.        . digoxin (LANOXIN) 0.125 MG tablet Take 0.5 tablets (0.0625 mg total) by mouth daily.  15 tablet  3   No current facility-administered medications for this encounter.    BP 106/62  Pulse 75  Wt 173 lb 4 oz (78.586 kg)  SpO2 95% General: NAD wife present  Neck: No JVD, no thyromegaly or thyroid nodule.  Lungs: Clear to auscultation bilaterally with normal respiratory effort. CV: Nondisplaced PMI.  Heart regular S1/S2, no S3/S4, no murmur.  No peripheral edema.  No carotid bruit.  Normal pedal pulses.  Abdomen: Soft, nontender, no hepatosplenomegaly, no distention.  Skin: Intact without lesions or rashes.  Neurologic: Alert and oriented x 3.  Psych: Normal affect. Extremities: No clubbing or cyanosis.   Assessment/Plan:  1. Chronic systolic CHF: Ischemic cardiomyopathy s/p St Jude CRT-D, EF 20-25% (07/2013).  CO was low on RHC but CPX actually looked reasonably  good.  Stable NYHA class III symptoms.  He is not volume overloaded on exam. - Will continue lasix 80 mg BID and metolazone as needed for weight 169 lbs or greater. Check BMET and pro-BNP today.  - Continue current ramipril, spironolactone, and Coreg. Patients SBP at home 90-110 will not titrate these medications today. - I will try him on a lower does of digoxin today than was used in the past, 0.0625 mg daily. Check level at next appointment.  - Would consider use of Corlanor in the future as BP will limit Coreg uptitration.  -  Reinforced the need and importance of daily weights, a low sodium diet, and fluid restriction (less than 2 L a day). Instructed to call the HF clinic if weight increases more than 3 lbs overnight or 5 lbs in a week.  2. CAD: Patent grafts on LHC in 4/15.  No chest pain. Continue statin.  He is on Eliquis so no longer taking aspirin.  3. Paroxysmal atrial fibrillation: Appears to be in NSR today. Continue eliquis 5 mg twice a day. No recent falls.  Will check CBC today.  4. Syncope: Long-standing history at this point, device interrogation has not correlated syncope with arrhythmias.  Suspect vasovagal.     Followup in 6 wks.   Loralie Champagne   10/02/2014

## 2014-10-02 NOTE — Progress Notes (Signed)
Echo Lab  2D Echocardiogram completed.  [place], RDCS 10/02/2014 10:21 AM

## 2014-10-02 NOTE — Patient Instructions (Signed)
Start Digoxin 0.0625 mg (1/2 tab) daily  Labs today  Your physician recommends that you schedule a follow-up appointment in: 6 weeks

## 2014-11-13 ENCOUNTER — Ambulatory Visit (HOSPITAL_COMMUNITY)
Admission: RE | Admit: 2014-11-13 | Discharge: 2014-11-13 | Disposition: A | Discharge status: Home / Self Care | Payer: Medicare Other | Source: Ambulatory Visit | Attending: Internal Medicine | Admitting: Internal Medicine

## 2014-11-13 ENCOUNTER — Encounter (HOSPITAL_COMMUNITY): Payer: Self-pay

## 2014-11-13 VITALS — BP 110/68 | HR 75 | Wt 174.1 lb

## 2014-11-13 DIAGNOSIS — I480 Paroxysmal atrial fibrillation: Secondary | ICD-10-CM | POA: Diagnosis not present

## 2014-11-13 DIAGNOSIS — I2510 Atherosclerotic heart disease of native coronary artery without angina pectoris: Secondary | ICD-10-CM | POA: Diagnosis not present

## 2014-11-13 DIAGNOSIS — Z951 Presence of aortocoronary bypass graft: Secondary | ICD-10-CM | POA: Insufficient documentation

## 2014-11-13 DIAGNOSIS — M109 Gout, unspecified: Secondary | ICD-10-CM | POA: Diagnosis not present

## 2014-11-13 DIAGNOSIS — N189 Chronic kidney disease, unspecified: Secondary | ICD-10-CM | POA: Diagnosis not present

## 2014-11-13 DIAGNOSIS — R55 Syncope and collapse: Secondary | ICD-10-CM | POA: Insufficient documentation

## 2014-11-13 DIAGNOSIS — Z794 Long term (current) use of insulin: Secondary | ICD-10-CM | POA: Insufficient documentation

## 2014-11-13 DIAGNOSIS — I255 Ischemic cardiomyopathy: Secondary | ICD-10-CM | POA: Diagnosis not present

## 2014-11-13 DIAGNOSIS — E119 Type 2 diabetes mellitus without complications: Secondary | ICD-10-CM | POA: Diagnosis not present

## 2014-11-13 DIAGNOSIS — E785 Hyperlipidemia, unspecified: Secondary | ICD-10-CM | POA: Diagnosis not present

## 2014-11-13 DIAGNOSIS — I5022 Chronic systolic (congestive) heart failure: Secondary | ICD-10-CM | POA: Diagnosis present

## 2014-11-13 DIAGNOSIS — I4891 Unspecified atrial fibrillation: Secondary | ICD-10-CM

## 2014-11-13 DIAGNOSIS — Z79899 Other long term (current) drug therapy: Secondary | ICD-10-CM | POA: Insufficient documentation

## 2014-11-13 LAB — DIGOXIN LEVEL: DIGOXIN LVL: 0.5 ng/mL — AB (ref 0.8–2.0)

## 2014-11-13 LAB — BASIC METABOLIC PANEL
ANION GAP: 21 — AB (ref 5–15)
BUN: 26 mg/dL — ABNORMAL HIGH (ref 6–23)
CALCIUM: 10.7 mg/dL — AB (ref 8.4–10.5)
CO2: 28 meq/L (ref 19–32)
Chloride: 86 mEq/L — ABNORMAL LOW (ref 96–112)
Creatinine, Ser: 1.79 mg/dL — ABNORMAL HIGH (ref 0.50–1.35)
GFR calc Af Amer: 41 mL/min — ABNORMAL LOW (ref >90)
GFR calc non Af Amer: 36 mL/min — ABNORMAL LOW (ref >90)
Glucose, Bld: 160 mg/dL — ABNORMAL HIGH (ref 70–99)
POTASSIUM: 3.6 meq/L — AB (ref 3.7–5.3)
SODIUM: 135 meq/L — AB (ref 137–147)

## 2014-11-13 NOTE — Progress Notes (Signed)
Patient ID: Mason Arnold, male   DOB: 08/25/1941, 73 y.o.   MRN: [id number] PCP: Dr. Linna Darner Primary cardiologist: Dr. Stanford Breed  73 yo with history of CAD s/p CABG, ischemic cardiomyopathy (20%) with St Jude CRT-D, paroxysmal atrial fibrillation, and recurrent syncope of uncertain etiology presents for CHF clinic followup.  He had RHC/LHC in 4/15 showing patent grafts but elevated filling pressures and low cardiac index (1.9 Fick/2.2 thermo).  Since that time, he was started on twice weekly metolazone and digoxin.  Weight came down about 6 lbs. Currently, he is using metolazone as needed 1-2 times/week.  Follow up for Heart Failure: Last visit digoxin started at 0.0625 mcg. Overall feeling pretty good. Has had 2 episodes of cough syncope since last visit. +DOE with going upstairs or minimal exertion, gets SOB with about 100-200 ft. Denies CP, orthopnea, PND or edema. Weight at home 169-176 lbs. Following a low salt diet and drinking less than 2L a day. Has needed metolazone ~ 10 times since last visit. HR at home 58-68 range  RHC (4/15): mean RA 9, PA 61/24 (mean 38), mean PCWP 29, CI 1.9 Fick/2.2 thermo, PVR 2.7.   CPX 04/24/14: FVC 2.70 (78%)  FEV1 2.15 (86%)  FEV1/FVC 80%  MVV 84 (80%) Resting HR: 65 Peak HR: 134 (92% age predicted max HR) Peak VO2: 19.6 (76.1% predicted peak VO2) VE/VCO2 slope: 34.8 OUES: 1.58 Peak RER: 1.23 Ventilatory Threshold: 15.0 (58.3% predicted peak VO2) Peak RR 46 Peak Ventilation: 68.1 VE/MVV: 81% PETCO2 at peak: 30 O2pulse: 11 (76% predicted O2pulse)  Labs (4/15): K 4.7, creatinine 1.4 Labs (5/15): K 3.7 Cr 1.56 pBNP 1,353 Labs (8/15): K 4.8, creatinine 1.6, pBNP 724  PMH: 1. HTN 2. Hyperlipidemia 3. CAD: s/p CABG with LIMA-LAD, SVG-ramus, SVG-OM3, SVG-PDA.  LHC (4/15) with patent grafts.  4. Ischemic cardiomyopathy: Echo (8/14) with EF 20-25% with regional WMAs, mild MR, mildly decreased RV systolic function.  EF LV-gram (4/15) was 15%. He has a Armed forces training and education officer CRT-D device (upgraded 1/14).  RHC (4/15): mean RA 9, PA 61/24 (mean 38), mean PCWP 29, CI 1.9 Fick/2.2 thermo, PVR 2.7. CPX (04/2014): Peak VO2: 19.6 (76% predicted peak VO2), VE/VCO2: 34.8, Peak RER 1.23.  Echo (10/15) with EF 20-25% with regional wall motion abnormalities, normal RV size with mild to moderately decreased systolic function, mild AS, mild MR.  5. Syncopal episodes: Relatively frequent, etiology uncertain.  He does get lightheaded/near-syncopal with coughing spells, but he has syncope at other times also with no warning.  Since device has been in place, he has had VT detected but it has not been clearly correlated with syncopal episodes. Suspect vasomotor.  6. Atrial fibrillation: Paroxysmal.  Has not been anticoagulated due to relatively frequent syncope/falls.  7. Gout 8. H/o LBBB 9. Type II diabetes 10. CKD  SH: Married, lives in [place], nonsmoker, rare ETOH.   FH: CAD  ROS: All systems reviewed and negative except as per HPI.   Current Outpatient Prescriptions  Medication Sig Dispense Refill  . acetaminophen (TYLENOL) 500 MG tablet Take 500 mg by mouth every 6 (six) hours as needed. For pain    . albuterol (PROVENTIL HFA;VENTOLIN HFA) 108 (90 BASE) MCG/ACT inhaler Inhale 2 puffs into the lungs every 4 (four) hours as needed for wheezing. 1 Inhaler 6  . allopurinol (ZYLOPRIM) 100 MG tablet Take 1 tablet (100 mg total) by mouth daily. 30 tablet 6  . apixaban (ELIQUIS) 5 MG TABS tablet Take 1 tablet (5 mg total) by mouth 2 (  two) times daily. 60 tablet 6  . calcium carbonate (OS-CAL) 600 MG TABS Take 600 mg by mouth daily.      . carvedilol (COREG) 3.125 MG tablet Take 1 tablet (3.125 mg total) by mouth 2 (two) times daily. 180 tablet 3  . digoxin (LANOXIN) 0.125 MG tablet Take 0.5 tablets (0.0625 mg total) by mouth daily. 15 tablet 3  . famotidine (PEPCID) 10 MG tablet Take 10 mg by mouth daily.     Marland Kitchen FLUoxetine (PROZAC) 20 MG capsule TAKE 1 CAPSULE BY MOUTH DAILY.  90 capsule 0  . furosemide (LASIX) 80 MG tablet Take 1 tablet (80 mg total) by mouth 2 (two) times daily. 60 tablet 6  . insulin glargine (LANTUS) 100 UNIT/ML injection Inject 30-42 Units into the skin at bedtime. Sliding scale    . loratadine (CLARITIN) 10 MG tablet Take 10 mg by mouth daily as needed. For allergies    . metolazone (ZAROXOLYN) 2.5 MG tablet Take 2.5 mg by mouth. Take 2.5 mg if wieght is 169 pounds.    . Multiple Vitamin (MULTIVITAMIN) tablet Take 1 tablet by mouth daily.     Marland Kitchen omega-3 acid ethyl esters (LOVAZA) 1 G capsule Take 2 g by mouth 2 (two) times daily.      . potassium chloride SA (K-DUR,KLOR-CON) 20 MEQ tablet Take 20 mEq by mouth daily. Take Additional tab (40 MeQtotal) if metolalzone is needed    . pravastatin (PRAVACHOL) 80 MG tablet Take 1 tablet (80 mg total) by mouth every evening. 90 tablet 3  . ramipril (ALTACE) 2.5 MG capsule TAKE 1 CAPSULE BY MOUTH ONCE A DAY 90 capsule 5  . spironolactone (ALDACTONE) 25 MG tablet Take 25 mg by mouth daily.       No current facility-administered medications for this encounter.    BP 110/68 mmHg  Pulse 75  Wt 174 lb 1.9 oz (78.98 kg)  SpO2 94% General: NAD wife present  Neck: JVP 7, no thyromegaly or thyroid nodule.  Lungs: Clear to auscultation bilaterally with normal respiratory effort. CV: Nondisplaced PMI.  Heart regular S1/S2, no S3/S4, no murmur.  No peripheral edema.  No carotid bruit.  Normal pedal pulses.  Abdomen: Soft, nontender, no hepatosplenomegaly, no distention.  Skin: Intact without lesions or rashes.  Neurologic: Alert and oriented x 3.  Psych: Normal affect. Extremities: No clubbing or cyanosis.   Assessment/Plan:  1. Chronic systolic CHF: Ischemic cardiomyopathy s/p St Jude CRT-D, EF 20-25% (07/2013).  CO was low on RHC but CPX actually looked reasonably good.  Stable NYHA class III symptoms.  He is not volume overloaded on exam. - Will continue lasix 80 mg BID and metolazone as needed for weight  174 lbs or greater. Check BMET and pro-BNP today.  - Continue current ramipril, spironolactone, and Coreg. Patients SBP at home 90-110 will not titrate these medications today. - Now on digoxin at 0.0625 mg daily. Check level today.  - We considered ivabradine but resting HR at home too low.  - Reinforced the need and importance of daily weights, a low sodium diet, and fluid restriction (less than 2 L a day). Instructed to call the HF clinic if weight increases more than 3 lbs overnight or 5 lbs in a week.  2. CAD: Patent grafts on LHC in 4/15.  No chest pain. Continue statin.  He is on Eliquis so no longer taking aspirin.  3. Paroxysmal atrial fibrillation: Appears to be in NSR today. Continue eliquis 5 mg twice a day.  No recent falls.  Will check CBC today.  4. Syncope: Long-standing history at this point, device interrogation has not correlated syncope with arrhythmias.  Suspect vasovagal.     Followup in 6 wks.   Junie Bame B   11/13/2014  Patient seen and examined with Junie Bame, NP. We discussed all aspects of the encounter. I agree with the assessment and plan as stated above.   Remains NYHA III. Volume status ok. Has been unable to tolerate HF meds further due to low BP. Doesn't qualify for ivabradine due to low resting HR. Continue to take metolazone for weight 174 or greater. Check labs today.   Benay Spice 2:43 PM

## 2014-11-13 NOTE — Patient Instructions (Addendum)
Please take Metolazone 30 minutes prior to Furosemide (Lasix)  Labs today  Your physician recommends that you schedule a follow-up appointment in: 2 months

## 2014-11-13 NOTE — Addendum Note (Signed)
Encounter addended by: Scarlette Calico, RN on: 11/13/2014  2:50 PM<BR>     Documentation filed: [employer], Patient Instructions Section, Orders

## 2014-11-16 ENCOUNTER — Encounter (HOSPITAL_COMMUNITY): Payer: Self-pay | Admitting: Internal Medicine

## 2014-11-20 ENCOUNTER — Ambulatory Visit (INDEPENDENT_AMBULATORY_CARE_PROVIDER_SITE_OTHER): Payer: Medicare Other | Admitting: *Deleted

## 2014-11-20 ENCOUNTER — Encounter: Payer: Self-pay | Admitting: Internal Medicine

## 2014-11-20 DIAGNOSIS — I472 Ventricular tachycardia, unspecified: Secondary | ICD-10-CM

## 2014-11-20 LAB — MDC_IDC_ENUM_SESS_TYPE_REMOTE
Battery Remaining Longevity: 25 mo
Brady Statistic AP VP Percent: 81 %
Brady Statistic AP VS Percent: 2 %
Brady Statistic AS VS Percent: 1 %
Brady Statistic RA Percent Paced: 82 %
Date Time Interrogation Session: 20151214141803
HighPow Impedance: 46 Ohm
HighPow Impedance: 48 Ohm
Implantable Pulse Generator Serial Number: 733092
Lead Channel Impedance Value: 490 Ohm
Lead Channel Pacing Threshold Amplitude: 1 V
Lead Channel Pacing Threshold Pulse Width: 0.4 ms
Lead Channel Pacing Threshold Pulse Width: 0.5 ms
Lead Channel Sensing Intrinsic Amplitude: 4.5 mV
Lead Channel Setting Pacing Amplitude: 2.125
Lead Channel Setting Pacing Amplitude: 3 V
Lead Channel Setting Pacing Pulse Width: 0.8 ms
Lead Channel Setting Sensing Sensitivity: 0.5 mV
MDC IDC MSMT BATTERY REMAINING PERCENTAGE: 37 %
MDC IDC MSMT BATTERY VOLTAGE: 2.86 V
MDC IDC MSMT LEADCHNL LV IMPEDANCE VALUE: 580 Ohm
MDC IDC MSMT LEADCHNL LV PACING THRESHOLD AMPLITUDE: 1.125 V
MDC IDC MSMT LEADCHNL RV IMPEDANCE VALUE: 600 Ohm
MDC IDC MSMT LEADCHNL RV PACING THRESHOLD AMPLITUDE: 1 V
MDC IDC MSMT LEADCHNL RV PACING THRESHOLD PULSEWIDTH: 0.8 ms
MDC IDC MSMT LEADCHNL RV SENSING INTR AMPL: 12 mV
MDC IDC SET LEADCHNL LV PACING PULSEWIDTH: 0.5 ms
MDC IDC SET LEADCHNL RA PACING AMPLITUDE: 2 V
MDC IDC SET ZONE DETECTION INTERVAL: 375 ms
MDC IDC STAT BRADY AS VP PERCENT: 16 %
Zone Setting Detection Interval: 250 ms
Zone Setting Detection Interval: 285 ms

## 2014-11-20 NOTE — Progress Notes (Signed)
Remote ICD transmission.   

## 2014-12-13 ENCOUNTER — Other Ambulatory Visit: Payer: Self-pay | Admitting: Internal Medicine

## 2014-12-13 NOTE — Telephone Encounter (Signed)
I do not see this on patient's medication list. Should he be taking.

## 2014-12-13 NOTE — Telephone Encounter (Signed)
Pt request refill for lisinopril (PRINIVIL,ZESTRIL) 20 MG tablet to be send to [facility]. Please check

## 2014-12-13 NOTE — Telephone Encounter (Signed)
This should not be taken if he is on Ramipril as they are in same class

## 2014-12-14 ENCOUNTER — Encounter: Payer: Self-pay | Admitting: Cardiology

## 2014-12-14 MED ORDER — FLUOXETINE HCL 20 MG PO CAPS: ORAL_CAPSULE | ORAL | Status: DC

## 2014-12-14 NOTE — Telephone Encounter (Signed)
Phone call to patient he states he did not request a refill for Lisinopril but is requesting a refill on Fluoxetine. He took his last one today.

## 2014-12-14 NOTE — Telephone Encounter (Signed)
Called pt no answer LMOM with md response fluoxetine sent to [facility].../lmb

## 2014-12-14 NOTE — Telephone Encounter (Signed)
Fluoxetine OK X 3 mos He needs to verify Pharmacy does not have both Ramiril & Lisinophil on his med list

## 2014-12-20 ENCOUNTER — Other Ambulatory Visit: Payer: Self-pay | Admitting: Cardiology

## 2014-12-28 ENCOUNTER — Encounter: Payer: Self-pay | Admitting: Cardiology

## 2015-01-31 ENCOUNTER — Other Ambulatory Visit: Payer: Self-pay | Admitting: Cardiology

## 2015-02-06 ENCOUNTER — Other Ambulatory Visit: Payer: Self-pay | Admitting: Cardiology

## 2015-02-21 ENCOUNTER — Encounter: Payer: Self-pay | Admitting: Internal Medicine

## 2015-02-21 ENCOUNTER — Ambulatory Visit (INDEPENDENT_AMBULATORY_CARE_PROVIDER_SITE_OTHER): Payer: Medicare Other | Admitting: *Deleted

## 2015-02-21 DIAGNOSIS — I255 Ischemic cardiomyopathy: Secondary | ICD-10-CM

## 2015-02-21 DIAGNOSIS — I472 Ventricular tachycardia, unspecified: Secondary | ICD-10-CM

## 2015-02-21 LAB — MDC_IDC_ENUM_SESS_TYPE_REMOTE
Battery Remaining Longevity: 25 mo
Battery Remaining Percentage: 35 %
Brady Statistic AP VP Percent: 83 %
Brady Statistic AS VP Percent: 14 %
Brady Statistic AS VS Percent: 1 %
Date Time Interrogation Session: 20160316143640
HighPow Impedance: 48 Ohm
Implantable Pulse Generator Serial Number: 733092
Lead Channel Impedance Value: 600 Ohm
Lead Channel Sensing Intrinsic Amplitude: 12 mV
Lead Channel Sensing Intrinsic Amplitude: 3.7 mV
Lead Channel Setting Pacing Amplitude: 2.625
Lead Channel Setting Pacing Pulse Width: 0.5 ms
Lead Channel Setting Sensing Sensitivity: 0.5 mV
MDC IDC MSMT BATTERY VOLTAGE: 2.86 V
MDC IDC MSMT LEADCHNL LV PACING THRESHOLD AMPLITUDE: 1.625 V
MDC IDC MSMT LEADCHNL LV PACING THRESHOLD PULSEWIDTH: 0.5 ms
MDC IDC MSMT LEADCHNL RA IMPEDANCE VALUE: 490 Ohm
MDC IDC MSMT LEADCHNL RA PACING THRESHOLD AMPLITUDE: 1 V
MDC IDC MSMT LEADCHNL RA PACING THRESHOLD PULSEWIDTH: 0.4 ms
MDC IDC MSMT LEADCHNL RV IMPEDANCE VALUE: 890 Ohm
MDC IDC MSMT LEADCHNL RV PACING THRESHOLD AMPLITUDE: 1 V
MDC IDC MSMT LEADCHNL RV PACING THRESHOLD PULSEWIDTH: 0.8 ms
MDC IDC SET LEADCHNL RA PACING AMPLITUDE: 2 V
MDC IDC SET LEADCHNL RV PACING AMPLITUDE: 3 V
MDC IDC SET LEADCHNL RV PACING PULSEWIDTH: 0.8 ms
MDC IDC SET ZONE DETECTION INTERVAL: 285 ms
MDC IDC STAT BRADY AP VS PERCENT: 1.7 %
MDC IDC STAT BRADY RA PERCENT PACED: 84 %
Zone Setting Detection Interval: 250 ms
Zone Setting Detection Interval: 375 ms

## 2015-02-21 NOTE — Progress Notes (Signed)
Remote ICD transmission.   

## 2015-03-01 ENCOUNTER — Other Ambulatory Visit: Payer: Self-pay | Admitting: Internal Medicine

## 2015-03-01 ENCOUNTER — Other Ambulatory Visit: Payer: Self-pay | Admitting: Cardiology

## 2015-03-01 NOTE — Telephone Encounter (Signed)
OK X 3 mos 

## 2015-03-07 ENCOUNTER — Encounter (HOSPITAL_COMMUNITY): Payer: Self-pay

## 2015-03-13 ENCOUNTER — Encounter: Payer: Self-pay | Admitting: Cardiology

## 2015-03-19 ENCOUNTER — Ambulatory Visit (HOSPITAL_COMMUNITY)
Admission: RE | Admit: 2015-03-19 | Discharge: 2015-03-19 | Disposition: A | Discharge status: Home / Self Care | Payer: Medicare Other | Source: Ambulatory Visit | Attending: Cardiology | Admitting: Cardiology

## 2015-03-19 VITALS — BP 95/56 | HR 70 | Wt 177.1 lb

## 2015-03-19 DIAGNOSIS — Z7902 Long term (current) use of antithrombotics/antiplatelets: Secondary | ICD-10-CM | POA: Insufficient documentation

## 2015-03-19 DIAGNOSIS — Z951 Presence of aortocoronary bypass graft: Secondary | ICD-10-CM | POA: Insufficient documentation

## 2015-03-19 DIAGNOSIS — Z95810 Presence of automatic (implantable) cardiac defibrillator: Secondary | ICD-10-CM | POA: Insufficient documentation

## 2015-03-19 DIAGNOSIS — I129 Hypertensive chronic kidney disease with stage 1 through stage 4 chronic kidney disease, or unspecified chronic kidney disease: Secondary | ICD-10-CM | POA: Diagnosis not present

## 2015-03-19 DIAGNOSIS — Z79899 Other long term (current) drug therapy: Secondary | ICD-10-CM | POA: Insufficient documentation

## 2015-03-19 DIAGNOSIS — E119 Type 2 diabetes mellitus without complications: Secondary | ICD-10-CM | POA: Diagnosis not present

## 2015-03-19 DIAGNOSIS — I255 Ischemic cardiomyopathy: Secondary | ICD-10-CM | POA: Insufficient documentation

## 2015-03-19 DIAGNOSIS — M109 Gout, unspecified: Secondary | ICD-10-CM | POA: Diagnosis not present

## 2015-03-19 DIAGNOSIS — E785 Hyperlipidemia, unspecified: Secondary | ICD-10-CM | POA: Diagnosis not present

## 2015-03-19 DIAGNOSIS — R55 Syncope and collapse: Secondary | ICD-10-CM

## 2015-03-19 DIAGNOSIS — I480 Paroxysmal atrial fibrillation: Secondary | ICD-10-CM | POA: Insufficient documentation

## 2015-03-19 DIAGNOSIS — N189 Chronic kidney disease, unspecified: Secondary | ICD-10-CM | POA: Insufficient documentation

## 2015-03-19 DIAGNOSIS — I2510 Atherosclerotic heart disease of native coronary artery without angina pectoris: Secondary | ICD-10-CM | POA: Insufficient documentation

## 2015-03-19 DIAGNOSIS — I5022 Chronic systolic (congestive) heart failure: Secondary | ICD-10-CM | POA: Diagnosis not present

## 2015-03-19 LAB — BASIC METABOLIC PANEL
ANION GAP: 15 (ref 5–15)
BUN: 19 mg/dL (ref 6–23)
CALCIUM: 9.4 mg/dL (ref 8.4–10.5)
CO2: 28 mmol/L (ref 19–32)
CREATININE: 1.47 mg/dL — AB (ref 0.50–1.35)
Chloride: 93 mmol/L — ABNORMAL LOW (ref 96–112)
GFR calc Af Amer: 52 mL/min — ABNORMAL LOW (ref >90)
GFR, EST NON AFRICAN AMERICAN: 45 mL/min — AB (ref >90)
Glucose, Bld: 174 mg/dL — ABNORMAL HIGH (ref 70–99)
Potassium: 3.6 mmol/L (ref 3.5–5.1)
Sodium: 136 mmol/L (ref 135–145)

## 2015-03-19 LAB — BRAIN NATRIURETIC PEPTIDE: B Natriuretic Peptide: 274.6 pg/mL — ABNORMAL HIGH (ref 0.0–100.0)

## 2015-03-19 NOTE — Patient Instructions (Signed)
Labs today  Your physician recommends that you schedule a follow-up appointment in: 3 months with a echocardiogram  Your physician has requested that you have an echocardiogram. Echocardiography is a painless test that uses sound waves to create images of your heart. It provides your doctor with information about the size and shape of your heart and how well your heart's chambers and valves are working. This procedure takes approximately one hour. There are no restrictions for this procedure.  Do the following things EVERYDAY: 1) Weigh yourself in the morning before breakfast. Write it down and keep it in a log. 2) Take your medicines as prescribed 3) Eat low salt foods-Limit salt (sodium) to 2000 mg per day.  4) Stay as active as you can everyday 5) Limit all fluids for the day to less than 2 liters 6)

## 2015-03-19 NOTE — Progress Notes (Signed)
Patient ID: Mason Arnold, male   DOB: May 25, 1941, 74 y.o.   MRN: [id number] PCP: Dr. Linna Darner Primary cardiologist: Dr. Stanford Breed  74 yo with history of CAD s/p CABG, ischemic cardiomyopathy (20%) with St Jude CRT-D, paroxysmal atrial fibrillation, and recurrent syncope of uncertain etiology presents for CHF clinic followup.  He had RHC/LHC in 4/15 showing patent grafts but elevated filling pressures and low cardiac index (1.9 Fick/2.2 thermo).  Since that time, he was started on twice weekly metolazone and digoxin.  Weight came down about 6 lbs. Currently, he is using metolazone as needed 1-2 times/week.  Follow up for Heart Failure:  Overall feeling pretty good. Denies presyncope/sycope. Denies SOB/PND/Orthopnea. Weight at home 169-174 lbs. Following a low salt diet and drinking less than 2L a day. Has needed metolazone ~ 10 times since last visit. Taking all medications.   RHC (4/15): mean RA 9, PA 61/24 (mean 38), mean PCWP 29, CI 1.9 Fick/2.2 thermo, PVR 2.7.   CPX 04/24/14: FVC 2.70 (78%)  FEV1 2.15 (86%)  FEV1/FVC 80%  MVV 84 (80%) Resting HR: 65 Peak HR: 134 (92% age predicted max HR) Peak VO2: 19.6 (76.1% predicted peak VO2) VE/VCO2 slope: 34.8 OUES: 1.58 Peak RER: 1.23 Ventilatory Threshold: 15.0 (58.3% predicted peak VO2) Peak RR 46 Peak Ventilation: 68.1 VE/MVV: 81% PETCO2 at peak: 30 O2pulse: 11 (76% predicted O2pulse)  Labs (4/15): K 4.7, creatinine 1.4 Labs (5/15): K 3.7 Cr 1.56 pBNP 1,353 Labs (8/15): K 4.8, creatinine 1.6, pBNP 724 Labs (11/13/2014): K 3.6 Creatinine 1.79 dig level 0.5   PMH: 1. HTN 2. Hyperlipidemia 3. CAD: s/p CABG with LIMA-LAD, SVG-ramus, SVG-OM3, SVG-PDA.  LHC (4/15) with patent grafts.  4. Ischemic cardiomyopathy: Echo (8/14) with EF 20-25% with regional WMAs, mild MR, mildly decreased RV systolic function.  EF LV-gram (4/15) was 15%. He has a Research officer, political party CRT-D device (upgraded 1/14).  RHC (4/15): mean RA 9, PA 61/24 (mean 38), mean PCWP 29, CI 1.9  Fick/2.2 thermo, PVR 2.7. CPX (04/2014): Peak VO2: 19.6 (76% predicted peak VO2), VE/VCO2: 34.8, Peak RER 1.23.  Echo (10/15) with EF 20-25% with regional wall motion abnormalities, normal RV size with mild to moderately decreased systolic function, mild AS, mild MR.  5. Syncopal episodes: Relatively frequent, etiology uncertain.  He does get lightheaded/near-syncopal with coughing spells, but he has syncope at other times also with no warning.  Since device has been in place, he has had VT detected but it has not been clearly correlated with syncopal episodes. Suspect vasomotor.  6. Atrial fibrillation: Paroxysmal.  Has not been anticoagulated due to relatively frequent syncope/falls.  7. Gout 8. H/o LBBB 9. Type II diabetes 10. CKD  SH: Married, lives in [place], nonsmoker, rare ETOH.   FH: CAD  ROS: All systems reviewed and negative except as per HPI.   Current Outpatient Prescriptions  Medication Sig Dispense Refill  . acetaminophen (TYLENOL) 500 MG tablet Take 500 mg by mouth every 6 (six) hours as needed. For pain    . albuterol (PROVENTIL HFA;VENTOLIN HFA) 108 (90 BASE) MCG/ACT inhaler Inhale 2 puffs into the lungs every 4 (four) hours as needed for wheezing. 1 Inhaler 6  . allopurinol (ZYLOPRIM) 100 MG tablet Take 1 tablet (100 mg total) by mouth daily. 30 tablet 6  . calcium carbonate (OS-CAL) 600 MG TABS Take 600 mg by mouth daily.      . carvedilol (COREG) 3.125 MG tablet TAKE 1 TABLET BY MOUTH 2 TIMES DAILY. 60 tablet 2  . digoxin (  LANOXIN) 0.125 MG tablet Take 0.5 tablets (0.0625 mg total) by mouth daily. 15 tablet 3  . ELIQUIS 5 MG TABS tablet TAKE 1 TABLET BY MOUTH 2 TIMES DAILY. 60 tablet 5  . famotidine (PEPCID) 10 MG tablet Take 10 mg by mouth daily.     Marland Kitchen FLUoxetine (PROZAC) 20 MG capsule TAKE 1 CAPSULE BY MOUTH DAILY. 90 capsule 0  . furosemide (LASIX) 80 MG tablet Take 1 tablet (80 mg total) by mouth 2 (two) times daily. 60 tablet 6  . insulin glargine (LANTUS) 100  UNIT/ML injection Inject 30-42 Units into the skin at bedtime. Sliding scale    . loratadine (CLARITIN) 10 MG tablet Take 10 mg by mouth daily as needed. For allergies    . metolazone (ZAROXOLYN) 2.5 MG tablet Take 2.5 mg by mouth. Take 2.5 mg if wieght is 169 pounds.    . Multiple Vitamin (MULTIVITAMIN) tablet Take 1 tablet by mouth daily.     Marland Kitchen omega-3 acid ethyl esters (LOVAZA) 1 G capsule Take 2 g by mouth 2 (two) times daily.      . potassium chloride SA (K-DUR,KLOR-CON) 20 MEQ tablet Take 20 mEq by mouth daily. Take Additional tab (40 MeQtotal) if metolalzone is needed    . pravastatin (PRAVACHOL) 80 MG tablet Take 1 tablet (80 mg total) by mouth every evening. 90 tablet 3  . ramipril (ALTACE) 2.5 MG capsule TAKE 1 CAPSULE BY MOUTH ONCE A DAY 90 capsule 5  . spironolactone (ALDACTONE) 25 MG tablet Take 25 mg by mouth daily.       No current facility-administered medications for this encounter.    BP 95/56 mmHg  Pulse 70  Wt 177 lb 1.9 oz (80.341 kg)  SpO2 95% General: NAD wife present  Neck: JVP 7, no thyromegaly or thyroid nodule.  Lungs: Clear to auscultation bilaterally with normal respiratory effort. CV: Nondisplaced PMI.  Heart regular S1/S2, no S3/S4, no murmur.  No peripheral edema.  No carotid bruit.  Normal pedal pulses.  Abdomen: Soft, nontender, no hepatosplenomegaly, no distention.  Skin: Intact without lesions or rashes.  Neurologic: Alert and oriented x 3.  Psych: Normal affect. Extremities: No clubbing or cyanosis.   Assessment/Plan:  1. Chronic systolic CHF: Ischemic cardiomyopathy s/p St Jude CRT-D, EF 20-25% (09/2014).  CO was low on RHC but CPX actually looked reasonably good.   Stable NYHA class III symptoms.  Volume status stable.  - Will continue lasix 80 mg BID and metolazone as needed for weight 174 lbs or greater.  - Continue current ramipril, spironolactone, and Coreg.  - Now on digoxin at 0.0625 mg daily. Check level today.  - Reinforced the need and  importance of daily weights, a low sodium diet, and fluid restriction (less than 2 L a day). Instructed to call the HF clinic if weight increases more than 3 lbs overnight or 5 lbs in a week.  2. CAD: Patent grafts on LHC in 4/15.  No chest pain. Continue statin.  He is on Eliquis so no longer taking aspirin.   3. Paroxysmal atrial fibrillation: Appears to be in NSR today. Continue eliquis 5 mg twice a day. No recent falls.  4. Syncope: No recent syncopal episodes.     Follow up in 3 months with BMET    Vivi Piccirilli  NP-C 03/19/2015

## 2015-03-27 ENCOUNTER — Encounter: Payer: Self-pay | Admitting: Cardiology

## 2015-04-18 ENCOUNTER — Other Ambulatory Visit: Payer: Self-pay | Admitting: Cardiology

## 2015-04-23 ENCOUNTER — Other Ambulatory Visit (HOSPITAL_COMMUNITY): Payer: Self-pay | Admitting: Internal Medicine

## 2015-05-23 ENCOUNTER — Encounter: Payer: Self-pay | Admitting: Internal Medicine

## 2015-05-23 ENCOUNTER — Ambulatory Visit (INDEPENDENT_AMBULATORY_CARE_PROVIDER_SITE_OTHER): Payer: Medicare Other | Admitting: *Deleted

## 2015-05-23 DIAGNOSIS — I255 Ischemic cardiomyopathy: Secondary | ICD-10-CM | POA: Diagnosis not present

## 2015-05-23 DIAGNOSIS — I5022 Chronic systolic (congestive) heart failure: Secondary | ICD-10-CM | POA: Diagnosis not present

## 2015-05-23 NOTE — Progress Notes (Signed)
Remote ICD transmission.   

## 2015-05-25 LAB — CUP PACEART REMOTE DEVICE CHECK
Battery Voltage: 2.84 V
Brady Statistic AP VS Percent: 1.2 %
Brady Statistic AS VP Percent: 12 %
Brady Statistic AS VS Percent: 1 %
Date Time Interrogation Session: 20160615133137
HighPow Impedance: 45 Ohm
Lead Channel Impedance Value: 480 Ohm
Lead Channel Pacing Threshold Amplitude: 1.5 V
Lead Channel Pacing Threshold Pulse Width: 0.4 ms
Lead Channel Sensing Intrinsic Amplitude: 4.1 mV
Lead Channel Setting Pacing Amplitude: 2.5 V
Lead Channel Setting Pacing Amplitude: 3 V
Lead Channel Setting Pacing Pulse Width: 0.5 ms
MDC IDC MSMT BATTERY REMAINING LONGEVITY: 20 mo
MDC IDC MSMT BATTERY REMAINING PERCENTAGE: 31 %
MDC IDC MSMT LEADCHNL LV IMPEDANCE VALUE: 550 Ohm
MDC IDC MSMT LEADCHNL LV PACING THRESHOLD PULSEWIDTH: 0.5 ms
MDC IDC MSMT LEADCHNL RA PACING THRESHOLD AMPLITUDE: 1 V
MDC IDC MSMT LEADCHNL RV IMPEDANCE VALUE: 610 Ohm
MDC IDC MSMT LEADCHNL RV PACING THRESHOLD AMPLITUDE: 1 V
MDC IDC MSMT LEADCHNL RV PACING THRESHOLD PULSEWIDTH: 0.8 ms
MDC IDC MSMT LEADCHNL RV SENSING INTR AMPL: 12 mV
MDC IDC PG SERIAL: 733092
MDC IDC SET LEADCHNL RA PACING AMPLITUDE: 2 V
MDC IDC SET LEADCHNL RV PACING PULSEWIDTH: 0.8 ms
MDC IDC SET LEADCHNL RV SENSING SENSITIVITY: 0.5 mV
MDC IDC SET ZONE DETECTION INTERVAL: 375 ms
MDC IDC STAT BRADY AP VP PERCENT: 86 %
MDC IDC STAT BRADY RA PERCENT PACED: 86 %
Zone Setting Detection Interval: 250 ms
Zone Setting Detection Interval: 285 ms

## 2015-05-30 ENCOUNTER — Other Ambulatory Visit: Payer: Self-pay | Admitting: Cardiology

## 2015-05-30 NOTE — Telephone Encounter (Signed)
Rx(s) sent to pharmacy electronically.  

## 2015-06-06 ENCOUNTER — Other Ambulatory Visit: Payer: Self-pay | Admitting: Internal Medicine

## 2015-06-06 ENCOUNTER — Encounter: Payer: Self-pay | Admitting: Cardiology

## 2015-06-06 NOTE — Telephone Encounter (Signed)
OK x1  My retirement date is 12/08/2015; but I will be in office only T, Weds & Thurs during the months Oct-Dec.You should transition your care to another PCP by Oct 1,2016.

## 2015-06-06 NOTE — Telephone Encounter (Signed)
prozac rx sent to pharm, patient advised to transition care by 10/1

## 2015-06-06 NOTE — Telephone Encounter (Signed)
Patients last office visit may,2015---please advise, thanks

## 2015-06-19 ENCOUNTER — Other Ambulatory Visit: Payer: Self-pay | Admitting: *Deleted

## 2015-06-19 MED ORDER — METOLAZONE 2.5 MG PO TABS: 2.5000 mg | ORAL_TABLET | Freq: Every day | ORAL | Status: DC

## 2015-06-19 MED ORDER — APIXABAN 5 MG PO TABS: 5.0000 mg | ORAL_TABLET | Freq: Two times a day (BID) | ORAL | Status: DC

## 2015-06-19 MED ORDER — CARVEDILOL 3.125 MG PO TABS: 3.1250 mg | ORAL_TABLET | Freq: Two times a day (BID) | ORAL | Status: DC

## 2015-06-19 MED ORDER — DIGOXIN 125 MCG PO TABS: 0.1250 mg | ORAL_TABLET | Freq: Every day | ORAL | Status: DC

## 2015-06-20 ENCOUNTER — Encounter: Payer: Self-pay | Admitting: Cardiology

## 2015-06-27 ENCOUNTER — Other Ambulatory Visit (HOSPITAL_COMMUNITY): Payer: Self-pay | Admitting: Internal Medicine

## 2015-06-27 ENCOUNTER — Other Ambulatory Visit: Payer: Self-pay | Admitting: Cardiology

## 2015-06-27 NOTE — Telephone Encounter (Signed)
BENSIMHON REFILL. THANK YOU FOR YOUR TIME.

## 2015-07-17 ENCOUNTER — Ambulatory Visit (HOSPITAL_COMMUNITY)
Admission: RE | Admit: 2015-07-17 | Discharge: 2015-07-17 | Disposition: A | Discharge status: Home / Self Care | Payer: Medicare Other | Source: Ambulatory Visit | Attending: Internal Medicine | Admitting: Internal Medicine

## 2015-07-17 ENCOUNTER — Ambulatory Visit (HOSPITAL_BASED_OUTPATIENT_CLINIC_OR_DEPARTMENT_OTHER)
Admission: RE | Admit: 2015-07-17 | Discharge: 2015-07-17 | Disposition: A | Discharge status: Home / Self Care | Payer: Medicare Other | Source: Ambulatory Visit | Attending: Internal Medicine | Admitting: Internal Medicine

## 2015-07-17 VITALS — BP 82/58 | HR 78 | Wt 171.0 lb

## 2015-07-17 DIAGNOSIS — I5022 Chronic systolic (congestive) heart failure: Secondary | ICD-10-CM | POA: Insufficient documentation

## 2015-07-17 DIAGNOSIS — N189 Chronic kidney disease, unspecified: Secondary | ICD-10-CM | POA: Diagnosis not present

## 2015-07-17 DIAGNOSIS — I480 Paroxysmal atrial fibrillation: Secondary | ICD-10-CM

## 2015-07-17 DIAGNOSIS — E1122 Type 2 diabetes mellitus with diabetic chronic kidney disease: Secondary | ICD-10-CM | POA: Diagnosis not present

## 2015-07-17 DIAGNOSIS — Z8249 Family history of ischemic heart disease and other diseases of the circulatory system: Secondary | ICD-10-CM | POA: Diagnosis not present

## 2015-07-17 DIAGNOSIS — I129 Hypertensive chronic kidney disease with stage 1 through stage 4 chronic kidney disease, or unspecified chronic kidney disease: Secondary | ICD-10-CM | POA: Diagnosis not present

## 2015-07-17 DIAGNOSIS — Z951 Presence of aortocoronary bypass graft: Secondary | ICD-10-CM | POA: Diagnosis not present

## 2015-07-17 DIAGNOSIS — I255 Ischemic cardiomyopathy: Secondary | ICD-10-CM | POA: Diagnosis not present

## 2015-07-17 DIAGNOSIS — E785 Hyperlipidemia, unspecified: Secondary | ICD-10-CM | POA: Diagnosis not present

## 2015-07-17 DIAGNOSIS — Z79899 Other long term (current) drug therapy: Secondary | ICD-10-CM | POA: Insufficient documentation

## 2015-07-17 DIAGNOSIS — Z7902 Long term (current) use of antithrombotics/antiplatelets: Secondary | ICD-10-CM | POA: Insufficient documentation

## 2015-07-17 DIAGNOSIS — I2510 Atherosclerotic heart disease of native coronary artery without angina pectoris: Secondary | ICD-10-CM | POA: Diagnosis not present

## 2015-07-17 DIAGNOSIS — Z95810 Presence of automatic (implantable) cardiac defibrillator: Secondary | ICD-10-CM | POA: Diagnosis not present

## 2015-07-17 DIAGNOSIS — R55 Syncope and collapse: Secondary | ICD-10-CM | POA: Diagnosis not present

## 2015-07-17 LAB — BASIC METABOLIC PANEL
ANION GAP: 16 — AB (ref 5–15)
BUN: 30 mg/dL — ABNORMAL HIGH (ref 6–20)
CO2: 27 mmol/L (ref 22–32)
Calcium: 9.5 mg/dL (ref 8.9–10.3)
Chloride: 88 mmol/L — ABNORMAL LOW (ref 101–111)
Creatinine, Ser: 2.26 mg/dL — ABNORMAL HIGH (ref 0.61–1.24)
GFR calc Af Amer: 31 mL/min — ABNORMAL LOW (ref >60)
GFR, EST NON AFRICAN AMERICAN: 27 mL/min — AB (ref >60)
Glucose, Bld: 171 mg/dL — ABNORMAL HIGH (ref 65–99)
Potassium: 3.5 mmol/L (ref 3.5–5.1)
Sodium: 131 mmol/L — ABNORMAL LOW (ref 135–145)

## 2015-07-17 LAB — DIGOXIN LEVEL

## 2015-07-17 MED ORDER — RAMIPRIL 2.5 MG PO CAPS: 2.5000 mg | ORAL_CAPSULE | Freq: Every day | ORAL | Status: DC

## 2015-07-17 MED ORDER — CARVEDILOL 3.125 MG PO TABS: 3.1250 mg | ORAL_TABLET | Freq: Two times a day (BID) | ORAL | Status: DC

## 2015-07-17 MED ORDER — DIGOXIN 125 MCG PO TABS
0.1250 mg | ORAL_TABLET | Freq: Every day | ORAL | Status: DC
Start: 2015-07-17 — End: 2016-04-02

## 2015-07-17 MED ORDER — FLUOXETINE HCL 20 MG PO CAPS: 20.0000 mg | ORAL_CAPSULE | Freq: Every day | ORAL | Status: DC

## 2015-07-17 MED ORDER — APIXABAN 5 MG PO TABS: 5.0000 mg | ORAL_TABLET | Freq: Two times a day (BID) | ORAL | Status: DC

## 2015-07-17 NOTE — Addendum Note (Signed)
Encounter addended by: Adora Fridge, Gem on: 07/17/2015 10:58 AM<BR>     Documentation filed: Notes Section

## 2015-07-17 NOTE — Progress Notes (Signed)
Patient ID: Vencent Henschen, male   DOB: 10/27/1941, 74 y.o.   MRN: [id number] PCP: Dr. Linna Darner Primary cardiologist: Dr. Stanford Breed  74 yo with history of CAD s/p CABG, ischemic cardiomyopathy (20%) with St Jude CRT-D, paroxysmal atrial fibrillation, and recurrent syncope of uncertain etiology presents for CHF clinic followup.  He had RHC/LHC in 4/15 showing patent grafts but elevated filling pressures and low cardiac index (1.9 Fick/2.2 thermo).  Since that time, he was started on metolazone and digoxin.  Weight came down about 6 lbs. Currently, he is using metolazone as needed 1-2 times/week.  Follow up for Heart Failure:  Overall feeling pretty good. Denies presyncope/sycope. Denies SOB/PND/Orthopnea. Weight at home 169-174 lbs. Following a low salt diet and drinking less than 2L a day. Takes metolazone 1x/week. Wife says he is sleeping a lot more + restless legs. No apnea. Taking all medications. Now with gout flare. Taking colchicine. SOB when going to mailbox. Doesn't go out much any more.   Echo today: EF 20% RV moderate dysfunction.   RHC (4/15): mean RA 9, PA 61/24 (mean 38), mean PCWP 29, CI 1.9 Fick/2.2 thermo, PVR 2.7.   CPX 04/24/14: FVC 2.70 (78%)  FEV1 2.15 (86%)  FEV1/FVC 80%  MVV 84 (80%) Resting HR: 65 Peak HR: 134 (92% age predicted max HR) Peak VO2: 19.6 (76.1% predicted peak VO2) VE/VCO2 slope: 34.8 OUES: 1.58 Peak RER: 1.23 Ventilatory Threshold: 15.0 (58.3% predicted peak VO2) Peak RR 46 Peak Ventilation: 68.1 VE/MVV: 81% PETCO2 at peak: 30 O2pulse: 11 (76% predicted O2pulse)  Labs (4/15): K 4.7, creatinine 1.4 Labs (5/15): K 3.7 Cr 1.56 pBNP 1,353 Labs (8/15): K 4.8, creatinine 1.6, pBNP 724 Labs (11/13/2014): K 3.6 Creatinine 1.79 dig level 0.5   PMH: 1. HTN 2. Hyperlipidemia 3. CAD: s/p CABG with LIMA-LAD, SVG-ramus, SVG-OM3, SVG-PDA.  LHC (4/15) with patent grafts.  4. Ischemic cardiomyopathy: Echo (8/14) with EF 20-25% with regional WMAs, mild MR, mildly  decreased RV systolic function.  EF LV-gram (4/15) was 15%. He has a Research officer, political party CRT-D device (upgraded 1/14).  RHC (4/15): mean RA 9, PA 61/24 (mean 38), mean PCWP 29, CI 1.9 Fick/2.2 thermo, PVR 2.7. CPX (04/2014): Peak VO2: 19.6 (76% predicted peak VO2), VE/VCO2: 34.8, Peak RER 1.23.  Echo (10/15) with EF 20-25% with regional wall motion abnormalities, normal RV size with mild to moderately decreased systolic function, mild AS, mild MR.  5. Syncopal episodes: Relatively frequent, etiology uncertain.  He does get lightheaded/near-syncopal with coughing spells, but he has syncope at other times also with no warning.  Since device has been in place, he has had VT detected but it has not been clearly correlated with syncopal episodes. Suspect vasomotor.  6. Atrial fibrillation: Paroxysmal.  Has not been anticoagulated due to relatively frequent syncope/falls.  7. Gout 8. H/o LBBB 9. Type II diabetes 10. CKD  SH: Married, lives in [place], nonsmoker, rare ETOH.   FH: CAD  ROS: All systems reviewed and negative except as per HPI.   Current Outpatient Prescriptions  Medication Sig Dispense Refill  . acetaminophen (TYLENOL) 500 MG tablet Take 500 mg by mouth every 6 (six) hours as needed. For pain    . albuterol (PROVENTIL HFA;VENTOLIN HFA) 108 (90 BASE) MCG/ACT inhaler Inhale 2 puffs into the lungs every 4 (four) hours as needed for wheezing. 1 Inhaler 6  . allopurinol (ZYLOPRIM) 100 MG tablet Take 1 tablet (100 mg total) by mouth daily. 30 tablet 6  . apixaban (ELIQUIS) 5 MG TABS tablet  Take 1 tablet (5 mg total) by mouth 2 (two) times daily. 60 tablet 0  . calcium carbonate (OS-CAL) 600 MG TABS Take 600 mg by mouth daily.      . carvedilol (COREG) 3.125 MG tablet Take 1 tablet (3.125 mg total) by mouth 2 (two) times daily with a meal. 30 tablet 0  . colchicine 0.6 MG tablet Take 0.6 mg by mouth daily as needed (gouty flare).    Marland Kitchen digoxin (LANOXIN) 0.125 MG tablet Take 1 tablet (0.125 mg total) by  mouth daily. 30 tablet 0  . famotidine (PEPCID) 10 MG tablet Take 10 mg by mouth daily.     Marland Kitchen FLUoxetine (PROZAC) 20 MG capsule Take 1 capsule (20 mg total) by mouth daily. Patient should transition his care by Sep 08, 2015 90 capsule 1  . furosemide (LASIX) 80 MG tablet Take 1 tablet (80 mg total) by mouth 2 (two) times daily. 60 tablet 6  . insulin glargine (LANTUS) 100 UNIT/ML injection Inject 90 Units into the skin at bedtime. Sliding scale    . loratadine (CLARITIN) 10 MG tablet Take 10 mg by mouth daily as needed. For allergies    . metolazone (ZAROXOLYN) 2.5 MG tablet Take 1 tablet (2.5 mg total) by mouth daily. (Patient taking differently: Take 2.5 mg by mouth daily as needed (for weight >169 lbs). ) 30 tablet 0  . Multiple Vitamin (MULTIVITAMIN) tablet Take 1 tablet by mouth daily.     Marland Kitchen omega-3 acid ethyl esters (LOVAZA) 1 G capsule Take 2 g by mouth 2 (two) times daily.      . potassium chloride SA (K-DUR,KLOR-CON) 20 MEQ tablet TAKE 1 TABLET BY MOUTH DAILY TAKE ADDITIONAL TABLET ON MON&FRI (Patient taking differently: TAKE 1 TABLET BY MOUTH DAILY. TAKE ADDITIONAL TABLET with metolazone) 115 tablet 3  . pravastatin (PRAVACHOL) 80 MG tablet Take 1 tablet (80 mg total) by mouth every evening. 90 tablet 3  . ramipril (ALTACE) 2.5 MG capsule TAKE 1 CAPSULE BY MOUTH ONCE A DAY 90 capsule 5  . spironolactone (ALDACTONE) 25 MG tablet Take 25 mg by mouth daily.       No current facility-administered medications for this encounter.    BP 82/58 mmHg  Pulse 78  Wt 171 lb (77.565 kg)  SpO2 96% General: NAD wife present  Neck: JVP 7, no thyromegaly or thyroid nodule.  Lungs: Clear to auscultation bilaterally with normal respiratory effort. CV: Nondisplaced PMI.  Heart regular S1/S2, no S3/S4, no murmur.  No peripheral edema.  No carotid bruit.  Normal pedal pulses.  Abdomen: Soft, nontender, no hepatosplenomegaly, no distention.  Skin: Intact without lesions or rashes.  Neurologic: Alert and  oriented x 3.  Psych: Normal affect. Extremities: No clubbing or cyanosis.   Assessment/Plan:  1. Chronic systolic CHF: Ischemic cardiomyopathy s/p St Jude CRT-D, EF 20-25% (09/2014) and again today.  CO was low on RHC but CPX actually looked reasonably good in 2015.   Overall I think he is slightly worse. Now NYHA III-IIIB despite meticulous volume management. I worry about recurrent low output.  Echo today with persistent low EF 20% and moderate RV dysfunction. - We have discussed the options of repeating CPX testing or a trial of exercise training at the [facility]. He is reluctant to do a CPX test and hesitantly agrees to [facility]. I will see him in 2 months to check back in with him. IHe may be nearing point to consider advanced therapies. - Will continue  lasix 80 mg BID and metolazone as needed for weight 174 lbs or greater.  - Continue current ramipril, spironolactone, and Coreg.  - Now on digoxin at 0.0625 mg daily. Check level today.  - Reinforced the need and importance of daily weights, a low sodium diet, and fluid restriction (less than 2 L a day). Instructed to call the HF clinic if weight increases more than 3 lbs overnight or 5 lbs in a week.  2. CAD: Patent grafts on LHC in 4/15.  No chest pain. Continue statin.  He is on Eliquis so no longer taking aspirin.   3. Paroxysmal atrial fibrillation: Appears to be in NSR today. Continue eliquis 5 mg twice a day. No recent falls.  4. Syncope: No recent syncopal episodes.   Glori Bickers  MD  07/17/2015

## 2015-07-17 NOTE — Patient Instructions (Signed)
Labs today  Refills have been returned to your pharmacy  You have been referred to [facility], they will contact you to arrange orientation.  Your physician recommends that you schedule a follow-up appointment in: 2 months  Do the following things EVERYDAY: 1) Weigh yourself in the morning before breakfast. Write it down and keep it in a log. 2) Take your medicines as prescribed 3) Eat low salt foods-Limit salt (sodium) to 2000 mg per day.  4) Stay as active as you can everyday 5) Limit all fluids for the day to less than 2 liters 6)

## 2015-07-17 NOTE — Progress Notes (Signed)
Advanced Heart Failure Medication Review by a Pharmacist  Does the patient  feel that his/her medications are working for him/her?  yes  Has the patient been experiencing any side effects to the medications prescribed?  no  Does the patient measure his/her own blood pressure or blood glucose at home?  yes   Does the patient have any problems obtaining medications due to transportation or finances?   no  Understanding of regimen: excellent Understanding of indications: excellent Potential of compliance: excellent    Pharmacist comments:  Mason Arnold is a pleasant 74 yo M presenting with his medication bottles. He has an excellent understanding of his medication regimen and the importance of consistent use. He reports excellent compliance and takes his medications at 7 am and 7 pm every day. No other medication issues noted today.  Ruta Hinds. Velva Harman, PharmD, [place] Clinical Pharmacist Pager: [phone] Phone: [phone] 07/17/2015 10:57 AM

## 2015-07-17 NOTE — Addendum Note (Signed)
Encounter addended by: Kerry Dory, CMA on: 07/17/2015 10:45 AM<BR>     Documentation filed: Dx Association, Patient Instructions Section, Orders

## 2015-07-17 NOTE — Progress Notes (Signed)
*  PRELIMINARY RESULTS* Echocardiogram 2D Echocardiogram has been performed.  Leavy Cella 07/17/2015, 10:53 AM

## 2015-07-25 ENCOUNTER — Ambulatory Visit (HOSPITAL_COMMUNITY)
Admission: RE | Admit: 2015-07-25 | Discharge: 2015-07-25 | Disposition: A | Discharge status: Home / Self Care | Payer: Medicare Other | Source: Ambulatory Visit | Attending: Internal Medicine | Admitting: Internal Medicine

## 2015-07-25 DIAGNOSIS — I5022 Chronic systolic (congestive) heart failure: Secondary | ICD-10-CM | POA: Diagnosis not present

## 2015-07-25 LAB — BASIC METABOLIC PANEL
ANION GAP: 12 (ref 5–15)
BUN: 18 mg/dL (ref 6–20)
CHLORIDE: 98 mmol/L — AB (ref 101–111)
CO2: 30 mmol/L (ref 22–32)
CREATININE: 1.61 mg/dL — AB (ref 0.61–1.24)
Calcium: 9.2 mg/dL (ref 8.9–10.3)
GFR calc non Af Amer: 40 mL/min — ABNORMAL LOW (ref >60)
GFR, EST AFRICAN AMERICAN: 47 mL/min — AB (ref >60)
Glucose, Bld: 136 mg/dL — ABNORMAL HIGH (ref 65–99)
POTASSIUM: 4.5 mmol/L (ref 3.5–5.1)
Sodium: 140 mmol/L (ref 135–145)

## 2015-08-21 LAB — HEMOGLOBIN A1C: HEMOGLOBIN A1C: 6.7

## 2015-08-29 ENCOUNTER — Other Ambulatory Visit: Payer: Self-pay | Admitting: Cardiology

## 2015-08-29 NOTE — Telephone Encounter (Signed)
REFILL 

## 2015-08-31 ENCOUNTER — Ambulatory Visit (INDEPENDENT_AMBULATORY_CARE_PROVIDER_SITE_OTHER): Payer: Medicare Other

## 2015-08-31 DIAGNOSIS — Z23 Encounter for immunization: Secondary | ICD-10-CM

## 2015-10-17 ENCOUNTER — Ambulatory Visit (HOSPITAL_COMMUNITY)
Admission: RE | Admit: 2015-10-17 | Discharge: 2015-10-17 | Disposition: A | Discharge status: Home / Self Care | Payer: Medicare Other | Source: Ambulatory Visit | Attending: Internal Medicine | Admitting: Internal Medicine

## 2015-10-17 ENCOUNTER — Encounter (HOSPITAL_COMMUNITY): Payer: Self-pay | Admitting: Internal Medicine

## 2015-10-17 VITALS — BP 82/58 | HR 89 | Wt 174.1 lb

## 2015-10-17 DIAGNOSIS — I5022 Chronic systolic (congestive) heart failure: Secondary | ICD-10-CM

## 2015-10-17 DIAGNOSIS — I2510 Atherosclerotic heart disease of native coronary artery without angina pectoris: Secondary | ICD-10-CM | POA: Diagnosis not present

## 2015-10-17 DIAGNOSIS — R55 Syncope and collapse: Secondary | ICD-10-CM | POA: Insufficient documentation

## 2015-10-17 DIAGNOSIS — I255 Ischemic cardiomyopathy: Secondary | ICD-10-CM

## 2015-10-17 DIAGNOSIS — I480 Paroxysmal atrial fibrillation: Secondary | ICD-10-CM

## 2015-10-17 LAB — BASIC METABOLIC PANEL
ANION GAP: 14 (ref 5–15)
BUN: 21 mg/dL — AB (ref 6–20)
CO2: 29 mmol/L (ref 22–32)
Calcium: 10 mg/dL (ref 8.9–10.3)
Chloride: 91 mmol/L — ABNORMAL LOW (ref 101–111)
Creatinine, Ser: 1.74 mg/dL — ABNORMAL HIGH (ref 0.61–1.24)
GFR calc Af Amer: 42 mL/min — ABNORMAL LOW (ref >60)
GFR calc non Af Amer: 37 mL/min — ABNORMAL LOW (ref >60)
GLUCOSE: 208 mg/dL — AB (ref 65–99)
POTASSIUM: 3.5 mmol/L (ref 3.5–5.1)
Sodium: 134 mmol/L — ABNORMAL LOW (ref 135–145)

## 2015-10-17 MED ORDER — APIXABAN 5 MG PO TABS
5.0000 mg | ORAL_TABLET | Freq: Two times a day (BID) | ORAL | Status: DC
Start: 2015-10-17 — End: 2016-02-20

## 2015-10-17 MED ORDER — CARVEDILOL 3.125 MG PO TABS: 3.1250 mg | ORAL_TABLET | Freq: Two times a day (BID) | ORAL | Status: DC

## 2015-10-17 NOTE — Patient Instructions (Signed)
Labs today  Your physician recommends that you schedule a follow-up appointment in: 3-4 months with Dr.Bensimhon  Do the following things EVERYDAY: 1) Weigh yourself in the morning before breakfast. Write it down and keep it in a log. 2) Take your medicines as prescribed 3) Eat low salt foods-Limit salt (sodium) to 2000 mg per day.  4) Stay as active as you can everyday 5) Limit all fluids for the day to less than 2 liters 6)

## 2015-10-17 NOTE — Progress Notes (Signed)
Patient ID: Mason Arnold, male   DOB: 20-Feb-1941, 74 y.o.   MRN: [id number]     PCP: Dr. Linna Darner Primary cardiologist: Dr. Stanford Breed HF Cardiologist: Dr Haroldine Laws  74 yo with history of CAD s/p CABG, ischemic cardiomyopathy (20%) with St Jude CRT-D, paroxysmal atrial fibrillation, and recurrent syncope of uncertain etiology presents for CHF clinic followup.  He had RHC/LHC in 4/15 showing patent grafts but elevated filling pressures and low cardiac index (1.9 Fick/2.2 thermo).  Since that time, he was started on metolazone and digoxin.  Weight came down about 6 lbs. Currently, he is using metolazone as needed 1-2 times/week.  Follow up for Heart Failure:  He returns for follow up with his wife. Overall feeling ok. Dizzy over the last week. Taking extra lasix about every 2 weeks. Takes metolazone once a week. Weight at home 168-174 pounds. Completed 12 weeks exercise at [facility]. Says he has increased endurance. Gait improved SBP at [facility] ~110. Denies leg fatigue. Denies SOB/PND/Orthopnea. Mild dyspnea with hills. No bleeding problems. Taking all medication.   Echo 07/2015: EF 15-20%.    RHC (4/15): mean RA 9, PA 61/24 (mean 38), mean PCWP 29, CI 1.9 Fick/2.2 thermo, PVR 2.7.   CPX 04/24/14: FVC 2.70 (78%)  FEV1 2.15 (86%)  FEV1/FVC 80%  MVV 84 (80%) Resting HR: 65 Peak HR: 134 (92% age predicted max HR) Peak VO2: 19.6 (76.1% predicted peak VO2) VE/VCO2 slope: 34.8 OUES: 1.58 Peak RER: 1.23 Ventilatory Threshold: 15.0 (58.3% predicted peak VO2) Peak RR 46 Peak Ventilation: 68.1 VE/MVV: 81% PETCO2 at peak: 30 O2pulse: 11 (76% predicted O2pulse)  Labs (4/15): K 4.7, creatinine 1.4 Labs (5/15): K 3.7 Cr 1.56 pBNP 1,353 Labs (8/15): K 4.8, creatinine 1.6, pBNP 724 Labs (11/13/2014): K 3.6 Creatinine 1.79 dig level 0.5  Labs (07/17/2015): Dig level < 0.2  Labs (07/25/2015) K 4.5 Creatinine 1.61   PMH: 1. HTN 2. Hyperlipidemia 3. CAD: s/p CABG with LIMA-LAD, SVG-ramus, SVG-OM3, SVG-PDA.  LHC  (4/15) with patent grafts.  4. Ischemic cardiomyopathy: Echo (8/14) with EF 20-25% with regional WMAs, mild MR, mildly decreased RV systolic function.  EF LV-gram (4/15) was 15%. He has a Research officer, political party CRT-D device (upgraded 1/14).  RHC (4/15): mean RA 9, PA 61/24 (mean 38), mean PCWP 29, CI 1.9 Fick/2.2 thermo, PVR 2.7. CPX (04/2014): Peak VO2: 19.6 (76% predicted peak VO2), VE/VCO2: 34.8, Peak RER 1.23.  Echo (10/15) with EF 20-25% with regional wall motion abnormalities, normal RV size with mild to moderately decreased systolic function, mild AS, mild MR.  5. Syncopal episodes: Relatively frequent, etiology uncertain.  He does get lightheaded/near-syncopal with coughing spells, but he has syncope at other times also with no warning.  Since device has been in place, he has had VT detected but it has not been clearly correlated with syncopal episodes. Suspect vasomotor.  6. Atrial fibrillation: Paroxysmal.  Has not been anticoagulated due to relatively frequent syncope/falls.  7. Gout 8. H/o LBBB 9. Type II diabetes 10. CKD  SH: Married, lives in [place], nonsmoker, rare ETOH.   FH: CAD  ROS: All systems reviewed and negative except as per HPI.   Current Outpatient Prescriptions  Medication Sig Dispense Refill  . acetaminophen (TYLENOL) 500 MG tablet Take 500 mg by mouth every 6 (six) hours as needed. For pain    . albuterol (PROVENTIL HFA;VENTOLIN HFA) 108 (90 BASE) MCG/ACT inhaler Inhale 2 puffs into the lungs every 4 (four) hours as needed for wheezing. 1 Inhaler 6  . allopurinol (  ZYLOPRIM) 100 MG tablet Take 1 tablet (100 mg total) by mouth daily. 30 tablet 6  . apixaban (ELIQUIS) 5 MG TABS tablet Take 1 tablet (5 mg total) by mouth 2 (two) times daily. 60 tablet 3  . calcium carbonate (OS-CAL) 600 MG TABS Take 600 mg by mouth daily.      . carvedilol (COREG) 3.125 MG tablet Take 1 tablet (3.125 mg total) by mouth 2 (two) times daily with a meal. 60 tablet 3  . colchicine 0.6 MG tablet Take  0.6 mg by mouth daily as needed (gouty flare).    Marland Kitchen digoxin (LANOXIN) 0.125 MG tablet Take 1 tablet (0.125 mg total) by mouth daily. 30 tablet 3  . famotidine (PEPCID) 10 MG tablet Take 10 mg by mouth daily.     Marland Kitchen FLUoxetine (PROZAC) 20 MG capsule Take 1 capsule (20 mg total) by mouth daily. Patient should transition his care by Sep 08, 2015 90 capsule 1  . furosemide (LASIX) 80 MG tablet Take 1 tablet (80 mg total) by mouth 2 (two) times daily. 60 tablet 6  . insulin glargine (LANTUS) 100 UNIT/ML injection Inject 90 Units into the skin at bedtime. Sliding scale    . loratadine (CLARITIN) 10 MG tablet Take 10 mg by mouth daily as needed. For allergies    . metolazone (ZAROXOLYN) 2.5 MG tablet Take 1 tablet (2.5 mg total) by mouth daily. (Patient taking differently: Take 2.5 mg by mouth daily as needed (for weight >169 lbs). ) 30 tablet 0  . Multiple Vitamin (MULTIVITAMIN) tablet Take 1 tablet by mouth daily.     Marland Kitchen omega-3 acid ethyl esters (LOVAZA) 1 G capsule Take 2 g by mouth 2 (two) times daily.      . potassium chloride SA (K-DUR,KLOR-CON) 20 MEQ tablet TAKE 1 TABLET BY MOUTH DAILY TAKE ADDITIONAL TABLET ON MON&FRI (Patient taking differently: TAKE 1 TABLET BY MOUTH DAILY. TAKE ADDITIONAL TABLET with metolazone) 115 tablet 3  . pravastatin (PRAVACHOL) 80 MG tablet Take 1 tablet (80 mg total) by mouth every evening. 90 tablet 3  . ramipril (ALTACE) 2.5 MG capsule TAKE 1 CAPSULE BY MOUTH ONCE A DAY 90 capsule 0  . spironolactone (ALDACTONE) 25 MG tablet Take 25 mg by mouth daily.       No current facility-administered medications for this encounter.    BP 82/58 mmHg  Pulse 89  Wt 174 lb 1.9 oz (78.98 kg)  SpO2 94% General: NAD wife present  Neck: JVP 7, no thyromegaly or thyroid nodule.  Lungs: Clear to auscultation bilaterally with normal respiratory effort. CV: Nondisplaced PMI.  Heart regular S1/S2, no S3/S4, no murmur.  No peripheral edema.  No carotid bruit.  Normal pedal pulses.    Abdomen: Soft, nontender, no hepatosplenomegaly, no distention.  Skin: Intact without lesions or rashes.  Neurologic: Alert and oriented x 3.  Psych: Normal affect. Extremities: No clubbing or cyanosis.   Assessment/Plan: 1. Chronic systolic CHF: Ischemic cardiomyopathy s/p St Jude CRT-D, EF 15-20% (07/2015 )    [place] ok. He is doing a great job adjusting diuretics as needed.   Now NYHA II-III. Increased functional capacity after 3 month dedicated exercise program.   - Will continue lasix 80 mg BID and metolazone as needed for weight 174 lbs or greater.  - Continue current ramipril, spironolactone, and Coreg.  - Continue digoxin at 0.0625 mg daily.   - Reinforced the need and importance of daily weights, a low sodium diet, and fluid restriction (less than 2  L a day). Instructed to call the HF clinic if weight increases more than 3 lbs overnight or 5 lbs in a week.  2. CAD: Patent grafts on LHC in 4/15.  No chest pain. Continue statin.  He is on Eliquis so no longer taking aspirin.   3. Paroxysmal atrial fibrillation: Appears to be in NSR today. Continue eliquis 5 mg twice a day. No recent falls.  4. Syncope: No recent syncopal episodes.   BMET today.   Follow up in 3-4  months.   Juaquin Ludington  NP-C   10/17/2015

## 2015-10-23 ENCOUNTER — Encounter: Payer: Self-pay | Admitting: Internal Medicine

## 2015-10-23 NOTE — Progress Notes (Signed)
[facility] Patient Care Team: Hendricks Limes, MD as PCP - General   HPI  Mason Arnold is a 74 y.o. male he is seen in followup for ischemic cardiomyopathy status post coronary bypass graft, he is status post ICD implantation; underwent generator replacement in October 2010.  His last Myoview was performed in July of 2011. At that time, he had an ejection fraction of 28%. There was a previous infarct involving the anterior wall, apex, and inferior wall with minimal ischemia noted  He underwent catheterization 4/15 with patent grafts.  Echocardiogram in July of 2013 showed an ejection fraction of 20-25%. There was mild left ventricular enlargement, Moderate to severe left atrial enlargement and mildly reduced RV function   Echo 8/16 EF 15-20%  January 2014 he underwent CRT upgrade   He has been followed by CHF w discussion of VAD support  He is disinclined as he feels it is being pushed on him  He has had no recurrent syncope. I  [facility] and [facility]. He has also been on the antidepressants now for 2 years with further improvement.  He denies chest pain. There is some lightheadedness with changes in position but it is reasonably controlled.  Interrogation of his device demonstrated one episode of VT-NS but we are not sure if it is the same day as his syncope      Past Medical History  Diagnosis Date  . Gout   . Ventricular tachycardia ([place])   . Ischemic heart disease   . Congestive heart failure ([place])   . Left bundle branch block   . Hyperlipidemia   . Pacemaker   . ICD (implantable cardiac defibrillator) in place   . Hypertension   . Heart murmur   . Anginal pain ([place])   . Myocardial infarction ([place]) 2000; 2002; 2004; ~ 2006  . Pneumonia     "once, I think" (12/16/2012)  . Diabetes mellitus type II     IDDM , [facility]  . Gout     roast beef is a trigger    Past Surgical History  Procedure Laterality Date  .  Coronary artery bypass graft  2000    CABG X4  . Urachal cyst excision  ~ 1995  . Vasectomy  ?1967  . A-v cardiac pacemaker insertion  2002; 2010  . Insert / replace / remove pacemaker  12/16/2012    LV lead placement; ICD assessment   . Tonsillectomy and adenoidectomy  1954  . Cardiac catheterization      "3 or 4; never had interventions" (12/16/2012)  . Biv icd genertaor change out N/A 12/16/2012    Procedure: BIV ICD GENERTAOR CHANGE OUT;  Surgeon: Deboraha Sprang, MD;  Location: [facility] CATH LAB;  Service: Cardiovascular;  Laterality: N/A;  . Left and right heart catheterization with coronary/graft angiogram N/A 04/04/2014    Procedure: LEFT AND RIGHT HEART CATHETERIZATION WITH Beatrix Fetters;  Surgeon: Jolaine Artist, MD;  Location: [facility] CATH LAB;  Service: Cardiovascular;  Laterality: N/A;    Current Outpatient Prescriptions  Medication Sig Dispense Refill  . acetaminophen (TYLENOL) 500 MG tablet Take 500 mg by mouth every 6 (six) hours as needed. For pain    . albuterol (PROVENTIL HFA;VENTOLIN HFA) 108 (90 BASE) MCG/ACT inhaler Inhale 2 puffs into the lungs every 4 (four) hours as needed for wheezing. 1 Inhaler 6  . allopurinol (ZYLOPRIM) 100 MG tablet Take 1 tablet (100 mg total) by mouth daily. [place]  tablet 6  . apixaban (ELIQUIS) 5 MG TABS tablet Take 1 tablet (5 mg total) by mouth 2 (two) times daily. 60 tablet 3  . calcium carbonate (OS-CAL) 600 MG TABS Take 600 mg by mouth daily.      . carvedilol (COREG) 3.125 MG tablet Take 1 tablet (3.125 mg total) by mouth 2 (two) times daily with a meal. 60 tablet 3  . colchicine 0.6 MG tablet Take 0.6 mg by mouth daily as needed (gouty flare).    Marland Kitchen digoxin (LANOXIN) 0.125 MG tablet Take 1 tablet (0.125 mg total) by mouth daily. 30 tablet 3  . famotidine (PEPCID) 10 MG tablet Take 10 mg by mouth daily.     Marland Kitchen FLUoxetine (PROZAC) 20 MG capsule Take 1 capsule (20 mg total) by mouth daily. Patient should transition his care by Sep 08, 2015 90  capsule 1  . furosemide (LASIX) 80 MG tablet Take 1 tablet (80 mg total) by mouth 2 (two) times daily. 60 tablet 6  . insulin glargine (LANTUS) 100 UNIT/ML injection Inject 90 Units into the skin at bedtime. Sliding scale    . loratadine (CLARITIN) 10 MG tablet Take 10 mg by mouth daily as needed. For allergies    . metolazone (ZAROXOLYN) 2.5 MG tablet Take 1 tablet (2.5 mg total) by mouth daily. (Patient taking differently: Take 2.5 mg by mouth daily as needed (for weight >169 lbs). ) 30 tablet 0  . Multiple Vitamin (MULTIVITAMIN) tablet Take 1 tablet by mouth daily.     Marland Kitchen omega-3 acid ethyl esters (LOVAZA) 1 G capsule Take 2 g by mouth 2 (two) times daily.      . potassium chloride SA (K-DUR,KLOR-CON) 20 MEQ tablet TAKE 1 TABLET BY MOUTH DAILY TAKE ADDITIONAL TABLET ON MON&FRI (Patient taking differently: TAKE 1 TABLET BY MOUTH DAILY. TAKE ADDITIONAL TABLET with metolazone) 115 tablet 3  . pravastatin (PRAVACHOL) 80 MG tablet Take 1 tablet (80 mg total) by mouth every evening. 90 tablet 3  . ramipril (ALTACE) 2.5 MG capsule TAKE 1 CAPSULE BY MOUTH ONCE A DAY 90 capsule 0  . spironolactone (ALDACTONE) 25 MG tablet Take 25 mg by mouth daily.       No current facility-administered medications for this visit.    Allergies  Allergen Reactions  . Ampicillin Rash  . Crestor [Rosuvastatin Calcium] Rash and Other (See Comments)    Rash as nodules ("like strawberries")  . Orange Fruit [Citrus] Swelling and Other (See Comments)    Lips swelling, severe headache   . Propoxyphene N-Acetaminophen Other (See Comments)    hallucinations  [darvicet]    Review of Systems negative except from HPI and PMH  Physical Exam BP 106/64 mmHg  Pulse 61  Ht 5\' 5"  (1.651 m)  Wt 173 lb 9.6 oz (78.744 kg)  BMI 28.89 kg/m2 Well developed and nourished in no acute distress HENT normal Neck supple with JVP-flat Clear Regular rate and rhythm, no murmurs or gallops Abd-soft with active BS No Clubbing cyanosis  edema Skin-warm and dry A & Oriented  Grossly normal sensory and motor function  ECG dated today AV pacing at 86 with an up right QRS in V1 and negative in lead 1 consistent with biventricular pacing   Assessment and  Plan  Ischemic cardiomyopathy  Congestive heart failure  Implantable defibrillator-St. Jude-CRT The patient's device was interrogated.  The information was reviewed. No changes were made in the programming.     Atrial fibrillation  Ventricular tachycardia  Depression  Renal  insufficiency grade 3    Without symptoms of ischemia  Euvolemic continue current meds  No intercurrent Ventricular tachycardia  Depression improved--continue fluoxitene  We discussed the recall of this group of St. Jude devices. The patient is not dependent. The patient has not  had appropriate therapy.  We outlined that there can be abrupt battery failure. The patient will be followed intensely with [facility] and loss of transmission with signal battery failure. Alarms were demonstrated.

## 2015-10-24 ENCOUNTER — Encounter: Payer: Self-pay | Admitting: Internal Medicine

## 2015-10-24 ENCOUNTER — Ambulatory Visit (INDEPENDENT_AMBULATORY_CARE_PROVIDER_SITE_OTHER): Payer: Medicare Other | Admitting: Internal Medicine

## 2015-10-24 VITALS — BP 106/64 | HR 61 | Ht 65.0 in | Wt 173.6 lb

## 2015-10-24 DIAGNOSIS — R55 Syncope and collapse: Secondary | ICD-10-CM | POA: Diagnosis not present

## 2015-10-24 DIAGNOSIS — I5022 Chronic systolic (congestive) heart failure: Secondary | ICD-10-CM

## 2015-10-24 DIAGNOSIS — I472 Ventricular tachycardia, unspecified: Secondary | ICD-10-CM

## 2015-10-24 DIAGNOSIS — I255 Ischemic cardiomyopathy: Secondary | ICD-10-CM

## 2015-10-24 DIAGNOSIS — I480 Paroxysmal atrial fibrillation: Secondary | ICD-10-CM | POA: Diagnosis not present

## 2015-10-24 LAB — CUP PACEART INCLINIC DEVICE CHECK
Brady Statistic RV Percent Paced: 98 %
Date Time Interrogation Session: 20161116093917
HighPow Impedance: 51 Ohm
Implantable Lead Implant Date: 20020513
Implantable Lead Location: 753859
Implantable Lead Location: 753860
Lead Channel Impedance Value: 562.5 Ohm
Lead Channel Impedance Value: 600 Ohm
Lead Channel Impedance Value: 812.5 Ohm
Lead Channel Pacing Threshold Amplitude: 1 V
Lead Channel Pacing Threshold Amplitude: 1 V
Lead Channel Pacing Threshold Amplitude: 1.5 V
Lead Channel Pacing Threshold Amplitude: 1.5 V
Lead Channel Pacing Threshold Pulse Width: 0.4 ms
Lead Channel Pacing Threshold Pulse Width: 0.4 ms
Lead Channel Pacing Threshold Pulse Width: 0.5 ms
Lead Channel Pacing Threshold Pulse Width: 0.5 ms
Lead Channel Sensing Intrinsic Amplitude: 12 mV
Lead Channel Setting Pacing Amplitude: 2 V
Lead Channel Setting Pacing Amplitude: 3 V
Lead Channel Setting Pacing Pulse Width: 0.8 ms
MDC IDC LEAD IMPLANT DT: 20020513
MDC IDC LEAD IMPLANT DT: 20140109
MDC IDC LEAD LOCATION: 753858
MDC IDC LEAD SERIAL: 117908
MDC IDC MSMT BATTERY REMAINING LONGEVITY: 18
MDC IDC MSMT LEADCHNL RA SENSING INTR AMPL: 2.8 mV
MDC IDC MSMT LEADCHNL RV PACING THRESHOLD AMPLITUDE: 0.75 V
MDC IDC MSMT LEADCHNL RV PACING THRESHOLD AMPLITUDE: 0.75 V
MDC IDC MSMT LEADCHNL RV PACING THRESHOLD PULSEWIDTH: 0.8 ms
MDC IDC MSMT LEADCHNL RV PACING THRESHOLD PULSEWIDTH: 0.8 ms
MDC IDC PG SERIAL: 733092
MDC IDC SET LEADCHNL LV PACING AMPLITUDE: 2.375
MDC IDC SET LEADCHNL LV PACING PULSEWIDTH: 0.5 ms
MDC IDC SET LEADCHNL RV SENSING SENSITIVITY: 0.5 mV
MDC IDC STAT BRADY RA PERCENT PACED: 87 %

## 2015-10-24 NOTE — Patient Instructions (Signed)
Medication Instructions:  Your physician recommends that you continue on your current medications as directed. Please refer to the Current Medication list given to you today.   Labwork: none  Testing/Procedures: none  Follow-Up: Remote monitoring is used to monitor your Pacemaker of ICD from home. This monitoring reduces the number of office visits required to check your device to one time per year. It allows [place] to keep an eye on the functioning of your device to ensure it is working properly. You are scheduled for a device check from home on 01/23/16. You may send your transmission at any time that day. If you have a wireless device, the transmission will be sent automatically. After your physician reviews your transmission, you will receive a postcard with your next transmission date.  Your physician wants you to follow-up in: 12 months.  You will receive a reminder letter in the mail two months in advance. If you don't receive a letter, please call our office to schedule the follow-up appointment.   Any Other Special Instructions Will Be Listed Below (If Applicable).     If you need a refill on your cardiac medications before your next appointment, please call your pharmacy.

## 2015-10-29 ENCOUNTER — Telehealth: Payer: Self-pay | Admitting: Internal Medicine

## 2015-10-29 NOTE — Telephone Encounter (Signed)
New message      Pt states a message "PMT has occurred" appeared on his transmission box.  What does that mean?

## 2015-10-29 NOTE — Telephone Encounter (Addendum)
Called patient back after reviewing documentation from check on 10/24/15.  Patient had PACs in office that day.  Made patient aware and explained that as long as he continues to be asymptomatic, that we will continue to monitor his device as scheduled.  Made patient aware to call with worsening symptoms, questions, or concerns.  Patient verbalizes understanding and appreciation.  He denies questions at this time.

## 2015-10-29 NOTE — Telephone Encounter (Signed)
Returned patient's call.  Patient states that on his device follow-up printout from his last office visit, it states that he had PMT episodes.  He wants to know what that is.  Discussed pacing function with patient and explained that there are no negative consequences of PMT if he is asymptomatic, and that his recent device check on 10/24/15 was completely normal.  Patient verbalizes understanding.  He then asked if his heart rate was "41 beats a minute".  He clarifies that he recently "got checked" by the RN at the [facility] and that she was feeling his pulse and that it was 41bpm.  She advised that he follow-up with Dr. Caryl Comes.  Patient is concerned, but he states that he did not "bring it up" at his recent appointment.  Advised patient that he has PACs per recent device check and that these are difficult to palpate and could lead to miscounting of his HR.  Explained to patient that I will review his recent device check further and call him back.  Patient is appreciative and voices understanding.

## 2015-11-07 ENCOUNTER — Other Ambulatory Visit (HOSPITAL_COMMUNITY): Payer: Self-pay | Admitting: Student

## 2015-11-13 LAB — CBC AND DIFFERENTIAL
HCT: 45 % (ref 41–53)
HEMOGLOBIN: 16.2 g/dL (ref 13.5–17.5)
Platelets: 257 10*3/uL (ref 150–399)
WBC: 13.4 10^3/mL

## 2015-11-13 LAB — BASIC METABOLIC PANEL
Creatinine: 2 mg/dL — AB (ref 0.6–1.3)
Potassium: 4 mmol/L (ref 3.4–5.3)
Sodium: 131 mmol/L — AB (ref 137–147)

## 2015-11-28 ENCOUNTER — Other Ambulatory Visit: Payer: Self-pay | Admitting: Cardiology

## 2015-12-05 ENCOUNTER — Other Ambulatory Visit: Payer: Self-pay | Admitting: Internal Medicine

## 2015-12-05 NOTE — Telephone Encounter (Signed)
Called pt no answer LMOM need to make yearly appt before refills can be approved. Pls call office to make appt...Mason Arnold

## 2015-12-11 ENCOUNTER — Telehealth: Payer: Self-pay | Admitting: Internal Medicine

## 2015-12-11 DIAGNOSIS — I480 Paroxysmal atrial fibrillation: Secondary | ICD-10-CM

## 2015-12-11 MED ORDER — FLUOXETINE HCL 20 MG PO CAPS: 20.0000 mg | ORAL_CAPSULE | Freq: Every day | ORAL | Status: DC

## 2015-12-11 NOTE — Telephone Encounter (Signed)
Ok to fill 

## 2015-12-11 NOTE — Telephone Encounter (Signed)
Verified pharmacy is [facility] on [address] Patient is xferring care on 01/08/2016. Asking for one refill of FLUoxetine (PROZAC) 20 MG capsule [id number] until visit.

## 2015-12-11 NOTE — Telephone Encounter (Signed)
Pt last seen 5/15. Pt has been getting RX through Cardiology. Please advise if okay to fill?

## 2016-01-08 ENCOUNTER — Encounter: Payer: Self-pay | Admitting: Internal Medicine

## 2016-01-08 ENCOUNTER — Ambulatory Visit (INDEPENDENT_AMBULATORY_CARE_PROVIDER_SITE_OTHER): Payer: Medicare Other | Admitting: Internal Medicine

## 2016-01-08 VITALS — BP 108/60 | HR 64 | Temp 98.7°F | Resp 18 | Wt 175.0 lb

## 2016-01-08 DIAGNOSIS — I2510 Atherosclerotic heart disease of native coronary artery without angina pectoris: Secondary | ICD-10-CM | POA: Diagnosis not present

## 2016-01-08 DIAGNOSIS — E790 Hyperuricemia without signs of inflammatory arthritis and tophaceous disease: Secondary | ICD-10-CM

## 2016-01-08 DIAGNOSIS — E119 Type 2 diabetes mellitus without complications: Secondary | ICD-10-CM | POA: Diagnosis not present

## 2016-01-08 DIAGNOSIS — I480 Paroxysmal atrial fibrillation: Secondary | ICD-10-CM

## 2016-01-08 DIAGNOSIS — F329 Major depressive disorder, single episode, unspecified: Secondary | ICD-10-CM

## 2016-01-08 DIAGNOSIS — E782 Mixed hyperlipidemia: Secondary | ICD-10-CM

## 2016-01-08 DIAGNOSIS — R799 Abnormal finding of blood chemistry, unspecified: Secondary | ICD-10-CM

## 2016-01-08 DIAGNOSIS — Z23 Encounter for immunization: Secondary | ICD-10-CM | POA: Diagnosis not present

## 2016-01-08 DIAGNOSIS — F32A Depression, unspecified: Secondary | ICD-10-CM

## 2016-01-08 DIAGNOSIS — I10 Essential (primary) hypertension: Secondary | ICD-10-CM

## 2016-01-08 DIAGNOSIS — Z794 Long term (current) use of insulin: Secondary | ICD-10-CM

## 2016-01-08 MED ORDER — FLUOXETINE HCL 20 MG PO CAPS: 20.0000 mg | ORAL_CAPSULE | Freq: Every day | ORAL | Status: DC

## 2016-01-08 NOTE — Patient Instructions (Addendum)
   All other Health Maintenance issues reviewed.   All recommended immunizations and age-appropriate screenings are up-to-date.  Prevnar vaccine administered today.   Medications reviewed and updated.  No changes recommended at this time.  Your prescription(s) have been submitted to your pharmacy. Please take as directed and contact our office if you believe you are having problem(s) with the medication(s).

## 2016-01-08 NOTE — Progress Notes (Signed)
Pre visit review using our clinic review tool, if applicable. No additional management support is needed unless otherwise documented below in the visit note. 

## 2016-01-08 NOTE — Assessment & Plan Note (Signed)
Taking allopurinol 100 mg daily

## 2016-01-08 NOTE — Assessment & Plan Note (Signed)
Well controlled Stable Continue current dose of prozac

## 2016-01-08 NOTE — Progress Notes (Signed)
Subjective:    Patient ID: Mason Arnold, male    DOB: 1941-08-11, 75 y.o.   MRN: [id number]  HPI He is here to establish with a new pcp.  He is here for follow up and has no concerns.   CHF, CAD, ICD:  He is following with cardiology.  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  There is concern that his PPM-ICD is not working, which is causing him lots of stress.    Hypertension: He is taking his medication daily. He is compliant with a low sodium diet.  He denies chest pain, palpitations, edema, daily shortness of breath and regular headaches. He is exercising regularly.  He does monitor his blood pressure at home and it is normal.    Diabetes: He is taking his medication daily as prescribed. He is compliant with a diabetic diet. He is exercising regularly. He monitors his sugars and they have been running 96-120. He checks her feet daily and denies foot lesions. He is up-to-date with an ophthalmology examination.   Depression: He is taking his medication daily as prescribed. He denies any side effects from the medication. He feels his depression is well controlled and he is happy with his current dose of medication.    Medications and allergies reviewed with patient and updated if appropriate.  Patient Active Problem List   Diagnosis Date Noted  . Syncope 06/29/2013  . Ventricular tachycardia ([place]) 06/29/2013  . Depression 11/30/2012  . ICD-CRT  [facility] [place] 11/28/2011  . Atrial fibrillation ([place]) 02/03/2011  . Chronic systolic heart failure ([place]) 10/01/2010  . Coronary atherosclerosis 06/20/2010  . HYPOGLYCEMIA, HX OF 05/21/2010  . Cardiomyopathy, ischemic 06/12/2009  . HYPERURICEMIA, ASYMPTOMATIC 08/04/2008  . HYPERLIPIDEMIA 02/11/2008  . ORTHOSTATIC HYPOTENSION 02/11/2008  . DIABETES MELLITUS, TYPE II, CONTROLLED 10/18/2007  . UNSPECIFIED ESSENTIAL HYPERTENSION 10/18/2007  . GOUT 03/26/2007    Current Outpatient Prescriptions on File Prior to Visit  Medication Sig Dispense  Refill  . acetaminophen (TYLENOL) 500 MG tablet Take 500 mg by mouth every 6 (six) hours as needed. For pain    . albuterol (PROVENTIL HFA;VENTOLIN HFA) 108 (90 BASE) MCG/ACT inhaler Inhale 2 puffs into the lungs every 4 (four) hours as needed for wheezing. 1 Inhaler 6  . allopurinol (ZYLOPRIM) 100 MG tablet Take 1 tablet (100 mg total) by mouth daily. 30 tablet 6  . apixaban (ELIQUIS) 5 MG TABS tablet Take 1 tablet (5 mg total) by mouth 2 (two) times daily. 60 tablet 3  . calcium carbonate (OS-CAL) 600 MG TABS Take 600 mg by mouth daily.      . carvedilol (COREG) 3.125 MG tablet TAKE 1 TABLET BY MOUTH TWICE DAILY WITH MEALS 60 tablet 3  . colchicine 0.6 MG tablet Take 0.6 mg by mouth daily as needed (gouty flare).    Marland Kitchen digoxin (LANOXIN) 0.125 MG tablet Take 1 tablet (0.125 mg total) by mouth daily. 30 tablet 3  . famotidine (PEPCID) 10 MG tablet Take 10 mg by mouth daily.     Marland Kitchen FLUoxetine (PROZAC) 20 MG capsule Take 1 capsule (20 mg total) by mouth daily. 30 capsule 0  . furosemide (LASIX) 80 MG tablet Take 1 tablet (80 mg total) by mouth 2 (two) times daily. 60 tablet 6  . insulin glargine (LANTUS) 100 UNIT/ML injection Inject 90 Units into the skin at bedtime. Sliding scale    . loratadine (CLARITIN) 10 MG tablet Take 10 mg by mouth daily as needed.  For allergies    . metolazone (ZAROXOLYN) 2.5 MG tablet Take 1 tablet (2.5 mg total) by mouth daily. (Patient taking differently: Take 2.5 mg by mouth as needed (for weight >169 lbs). ) 30 tablet 0  . Multiple Vitamin (MULTIVITAMIN) tablet Take 1 tablet by mouth daily.     Marland Kitchen omega-3 acid ethyl esters (LOVAZA) 1 G capsule Take 2 g by mouth 2 (two) times daily.      . potassium chloride SA (K-DUR,KLOR-CON) 20 MEQ tablet TAKE 1 TABLET BY MOUTH DAILY TAKE ADDITIONAL TABLET ON MON&FRI (Patient taking differently: TAKE 1 TABLET BY MOUTH DAILY. TAKE ADDITIONAL TABLET with metolazone) 115 tablet 3  . pravastatin (PRAVACHOL) 80 MG tablet Take 1 tablet (80 mg  total) by mouth every evening. 90 tablet 3  . ramipril (ALTACE) 2.5 MG capsule TAKE 1 CAPSULE BY MOUTH ONCE A DAY 90 capsule 0  . spironolactone (ALDACTONE) 25 MG tablet Take 25 mg by mouth daily.       No current facility-administered medications on file prior to visit.    Past Medical History  Diagnosis Date  . Gout   . Ventricular tachycardia ([facility])   . Ischemic heart disease   . Congestive heart failure ([place])   . Left bundle branch block   . Hyperlipidemia   . Pacemaker   . ICD (implantable cardiac defibrillator) in place   . Hypertension   . Heart murmur   . Anginal pain ([place])   . Myocardial infarction ([place]) 2000; 2002; 2004; ~ 2006  . Pneumonia     "once, I think" (12/16/2012)  . Diabetes mellitus type II     IDDM , [facility]  . Gout     roast beef is a trigger    Past Surgical History  Procedure Laterality Date  . Coronary artery bypass graft  2000    CABG X4  . Urachal cyst excision  ~ 1995  . Vasectomy  ?1967  . A-v cardiac pacemaker insertion  2002; 2010  . Insert / replace / remove pacemaker  12/16/2012    LV lead placement; ICD assessment   . Tonsillectomy and adenoidectomy  1954  . Cardiac catheterization      "3 or 4; never had interventions" (12/16/2012)  . Biv icd genertaor change out N/A 12/16/2012    Procedure: BIV ICD GENERTAOR CHANGE OUT;  Surgeon: Deboraha Sprang, MD;  Location: [facility] CATH LAB;  Service: Cardiovascular;  Laterality: N/A;  . Left and right heart catheterization with coronary/graft angiogram N/A 04/04/2014    Procedure: LEFT AND RIGHT HEART CATHETERIZATION WITH Beatrix Fetters;  Surgeon: Jolaine Artist, MD;  Location[facility] C[facility] LAB;  Service: Cardiovascular;  Laterality: N/A;    Social History   Social History  . Marital Status: Married    Spouse Name: N/A  . Number of Children: N/A  . Years of Education: N/A   Social History Main Topics  . Smoking status: Never Smoker   . Smokeless tobacco: Former Systems developer    Types: Snuff, Chew      Comment: 12/16/2012 "stopped chew/snuff in ~ 1992 after 15 yr"  . Alcohol Use: 4.2 oz/week    7 Shots of liquor per week     Comment: one shot a day  . Drug Use: No     Comment: none  . Sexual Activity: Yes   Other Topics Concern  . Not on file   Social History Narrative   Lives with wife in [place].     Family  History  Problem Relation Age of Onset  . Heart attack Maternal Grandfather 78  . Heart attack Maternal Grandmother 82  . Heart attack Paternal Grandfather 100  . Heart attack Father 12  . Heart failure Father 55  . Aneurysm Brother   . Diabetes Neg Hx   . Stroke Neg Hx   . Cancer Neg Hx     Review of Systems  Constitutional: Negative for fever.  Respiratory: Positive for shortness of breath (occasional with moderate - severe exertion). Negative for cough and wheezing.   Cardiovascular: Positive for leg swelling (rare). Negative for chest pain and palpitations.  Musculoskeletal: Positive for arthralgias (right shoulder pain). Negative for myalgias.  Neurological: Negative for dizziness, light-headedness and headaches.       Objective:   Filed Vitals:   01/08/16 1509  BP: 108/60  Pulse: 64  Temp: 98.7 F (37.1 C)  Resp: 18   Filed Weights   01/08/16 1509  Weight: 175 lb (79.379 kg)   Body mass index is 29.12 kg/(m^2).   Physical Exam Constitutional: Appears well-developed and well-nourished. No distress.  Neck: Neck supple. No tracheal deviation present. No thyromegaly present.  No carotid bruit. No cervical adenopathy.   Cardiovascular: Normal rate, regular rhythm and normal heart sounds.   No murmur heard.  No edema Pulmonary/Chest: Effort normal and breath sounds normal. No respiratory distress. No wheezes.  Psych: normal mood and affect     Assessment & Plan:    See Problem List for Assessment and Plan of chronic medical problems.

## 2016-01-08 NOTE — Assessment & Plan Note (Signed)
BP Readings from Last 3 Encounters:  01/08/16 108/60  10/24/15 106/64  10/17/15 82/58   Well controlled Continue current medication

## 2016-01-08 NOTE — Assessment & Plan Note (Signed)
Following with cardiology No active symptoms Exercising Continue statin, eliquis, bp medications

## 2016-01-08 NOTE — Assessment & Plan Note (Signed)
Taking pravastatin daily Blood work done at the [facility] current medication

## 2016-01-08 NOTE — Assessment & Plan Note (Signed)
Last a1c 6.7 Insulin prescribed by [facility] Continue current regimen

## 2016-01-23 ENCOUNTER — Telehealth: Payer: Self-pay | Admitting: Cardiology

## 2016-01-23 ENCOUNTER — Ambulatory Visit (INDEPENDENT_AMBULATORY_CARE_PROVIDER_SITE_OTHER): Payer: Medicare Other | Admitting: *Deleted

## 2016-01-23 DIAGNOSIS — I255 Ischemic cardiomyopathy: Secondary | ICD-10-CM

## 2016-01-23 DIAGNOSIS — I472 Ventricular tachycardia, unspecified: Secondary | ICD-10-CM

## 2016-01-23 NOTE — Progress Notes (Signed)
Remote ICD transmission.   

## 2016-01-23 NOTE — Telephone Encounter (Signed)
Spoke with pt and reminded pt of remote transmission that is due today. Pt verbalized understanding.   

## 2016-01-24 ENCOUNTER — Encounter: Payer: Self-pay | Admitting: Internal Medicine

## 2016-02-11 LAB — CUP PACEART REMOTE DEVICE CHECK
Battery Remaining Longevity: 14 mo
Battery Remaining Percentage: 22 %
Battery Voltage: 2.78 V
Brady Statistic RA Percent Paced: 89 %
Date Time Interrogation Session: 20170215160011
HIGH POWER IMPEDANCE MEASURED VALUE: 50 Ohm
Implantable Lead Implant Date: 20140109
Implantable Lead Location: 753860
Implantable Lead Model: 148
Implantable Lead Model: 5076
Implantable Lead Serial Number: 117908
Lead Channel Impedance Value: 550 Ohm
Lead Channel Pacing Threshold Pulse Width: 0.5 ms
Lead Channel Sensing Intrinsic Amplitude: 12 mV
Lead Channel Sensing Intrinsic Amplitude: 5 mV
Lead Channel Setting Pacing Amplitude: 2 V
Lead Channel Setting Pacing Amplitude: 2.625
Lead Channel Setting Pacing Amplitude: 3 V
Lead Channel Setting Pacing Pulse Width: 0.5 ms
MDC IDC LEAD IMPLANT DT: 20020513
MDC IDC LEAD IMPLANT DT: 20020513
MDC IDC LEAD LOCATION: 753858
MDC IDC LEAD LOCATION: 753859
MDC IDC MSMT LEADCHNL LV IMPEDANCE VALUE: 530 Ohm
MDC IDC MSMT LEADCHNL LV PACING THRESHOLD AMPLITUDE: 1.625 V
MDC IDC MSMT LEADCHNL RV IMPEDANCE VALUE: 690 Ohm
MDC IDC PG SERIAL: 733092
MDC IDC SET LEADCHNL RV PACING PULSEWIDTH: 0.8 ms
MDC IDC SET LEADCHNL RV SENSING SENSITIVITY: 0.5 mV
MDC IDC STAT BRADY AP VP PERCENT: 90 %
MDC IDC STAT BRADY AP VS PERCENT: 1 %
MDC IDC STAT BRADY AS VP PERCENT: 9 %
MDC IDC STAT BRADY AS VS PERCENT: 1 %

## 2016-02-12 ENCOUNTER — Telehealth: Payer: Self-pay | Admitting: *Deleted

## 2016-02-12 NOTE — Telephone Encounter (Signed)
Called patient regarding unstable Corvue (heart failure diagnostic) over time. He reports compliance with diuretics. Offered ICM services for monthly monitoring of heart failure diagnostic. He is interested- I will forward to Sharman Cheek, RN to enroll in [facility].

## 2016-02-20 ENCOUNTER — Encounter: Payer: Self-pay | Admitting: Cardiology

## 2016-02-20 ENCOUNTER — Other Ambulatory Visit (HOSPITAL_COMMUNITY): Payer: Self-pay | Admitting: Adult Health

## 2016-02-21 ENCOUNTER — Telehealth: Payer: Self-pay

## 2016-02-21 NOTE — Telephone Encounter (Signed)
Referred to ICM clinic by Debroah Loop, RN, device team.  Spoke with patient and explained ICM program and he agreed to monthly follow up calls.  Remote ICM transmission scheduled for 03/13/2016.  My charge message sent with scheduled transmission date.

## 2016-03-05 ENCOUNTER — Other Ambulatory Visit (HOSPITAL_COMMUNITY): Payer: Self-pay | Admitting: Student

## 2016-03-05 ENCOUNTER — Encounter: Payer: Self-pay | Admitting: Cardiology

## 2016-03-13 ENCOUNTER — Ambulatory Visit (INDEPENDENT_AMBULATORY_CARE_PROVIDER_SITE_OTHER): Payer: Medicare Other

## 2016-03-13 DIAGNOSIS — Z95810 Presence of automatic (implantable) cardiac defibrillator: Secondary | ICD-10-CM

## 2016-03-13 DIAGNOSIS — I5022 Chronic systolic (congestive) heart failure: Secondary | ICD-10-CM | POA: Diagnosis not present

## 2016-03-13 NOTE — Progress Notes (Signed)
EPIC Encounter for ICM Monitoring  Patient Name: Mason Arnold is a 75 y.o. male Date: 03/13/2016 Primary Care Physican: Binnie Rail, MD Primary Cardiologist: Lamont Electrophysiologist: Faustino Congress Weight: 169 lb   Bi-V Pacing 99%      In the past month, have you:  1. Gained more than 2 pounds in a day or more than 5 pounds in a week? Yes, 4 lbs overnight.  Baseline weight 163-166 lbs  2. Had changes in your medications (with verification of current medications)? no  3. Had more shortness of breath than is usual for you? no  4. Limited your activity because of shortness of breath? no  5. Not been able to sleep because of shortness of breath? no  6. Had increased swelling in your feet or ankles? no  7. Had symptoms of dehydration (dizziness, dry mouth, increased thirst, decreased urine output) no  8. Had changes in sodium restriction? no  9. Been compliant with medication? Yes   ICM trend: 3 month view for 03/13/2016   ICM trend: 1 year view for 03/13/2016   Follow-up plan: ICM clinic phone appointment on 04/23/2016.  1st ICM encounter.  Thoracic impedance below reference line from 02/18/2016 to 02/23/2016, 03/02/2016 to 03/05/2016, 03/08/2016 to 03/10/2016 and 03/12/2016 suggesting fluid accumulation.  Patient has symptoms of weight gain.   Education given to limit sodium intake to < 2000 mg and fluid intake to 64 oz daily.  Encouraged to call for any fluid symptoms.  No changes today.    Patient manages fluid symptoms with Metolazone as ordered.    Copy of note sent to patient's primary care physician, primary cardiologist, and device following physician.  Rosalene Billings, RN, CCM 03/13/2016 2:11 PM

## 2016-03-26 ENCOUNTER — Telehealth (HOSPITAL_COMMUNITY): Payer: Self-pay | Admitting: *Deleted

## 2016-03-26 NOTE — Telephone Encounter (Signed)
Received VM from pt's wife left earlier today.  She states pt has been having wheezing nad his inhaler is expired, so they request a refill.  Pt also has had 3 syncopal/seizure activities which he has had in past but she would like to discuss everything with [place].  Attempted to call back w/no answer, will try again later

## 2016-03-28 NOTE — Telephone Encounter (Signed)
Spoke w/pt and wife.  They report these episodes have just been happening this week, he has had 1-2 a day and they are a little different that what he has had in the past.  Per wife pt coughs, then his head tilts back and his arms and hands are moving all around for about 10-15 sec then he sits back up and seems ok.  Pt states he feels lump in his throat, he clears it and starts to tilt his head back to get more air and then passes out, he states everything goes black, then he slowly can start to hear sounds and wakes up.  Wife also states pt is having trouble w/allergies and mucus so she did start him on plain Mucinex last night and he has been on Claritin for years.  She feels this could be causing the syncopal episodes.  Have asked them to send an icd transmission for eval and will discuss w/Dr Bensimhon and call them back.

## 2016-03-28 NOTE — Telephone Encounter (Signed)
Discussed w/Dr Bensimhon, he reviewed pt's transmission no vt/vf, feels likely vasovagal, but would like to see pt in clinic next week to eval.  Pt aware and agreeable, appt sch for Wed

## 2016-04-02 ENCOUNTER — Ambulatory Visit (HOSPITAL_COMMUNITY)
Admission: RE | Admit: 2016-04-02 | Discharge: 2016-04-02 | Disposition: A | Discharge status: Home / Self Care | Payer: Medicare Other | Source: Ambulatory Visit | Attending: Internal Medicine | Admitting: Internal Medicine

## 2016-04-02 ENCOUNTER — Encounter (HOSPITAL_COMMUNITY): Payer: Self-pay | Admitting: *Deleted

## 2016-04-02 ENCOUNTER — Encounter (HOSPITAL_COMMUNITY): Payer: Self-pay | Admitting: Internal Medicine

## 2016-04-02 VITALS — BP 106/64 | HR 62 | Wt 170.5 lb

## 2016-04-02 DIAGNOSIS — Z794 Long term (current) use of insulin: Secondary | ICD-10-CM | POA: Insufficient documentation

## 2016-04-02 DIAGNOSIS — I2510 Atherosclerotic heart disease of native coronary artery without angina pectoris: Secondary | ICD-10-CM | POA: Insufficient documentation

## 2016-04-02 DIAGNOSIS — Z79899 Other long term (current) drug therapy: Secondary | ICD-10-CM | POA: Diagnosis not present

## 2016-04-02 DIAGNOSIS — E785 Hyperlipidemia, unspecified: Secondary | ICD-10-CM | POA: Diagnosis not present

## 2016-04-02 DIAGNOSIS — I480 Paroxysmal atrial fibrillation: Secondary | ICD-10-CM

## 2016-04-02 DIAGNOSIS — E1122 Type 2 diabetes mellitus with diabetic chronic kidney disease: Secondary | ICD-10-CM | POA: Diagnosis not present

## 2016-04-02 DIAGNOSIS — R55 Syncope and collapse: Secondary | ICD-10-CM | POA: Insufficient documentation

## 2016-04-02 DIAGNOSIS — Z95810 Presence of automatic (implantable) cardiac defibrillator: Secondary | ICD-10-CM | POA: Insufficient documentation

## 2016-04-02 DIAGNOSIS — M109 Gout, unspecified: Secondary | ICD-10-CM | POA: Insufficient documentation

## 2016-04-02 DIAGNOSIS — N189 Chronic kidney disease, unspecified: Secondary | ICD-10-CM | POA: Diagnosis not present

## 2016-04-02 DIAGNOSIS — Z951 Presence of aortocoronary bypass graft: Secondary | ICD-10-CM | POA: Insufficient documentation

## 2016-04-02 DIAGNOSIS — I4891 Unspecified atrial fibrillation: Secondary | ICD-10-CM

## 2016-04-02 DIAGNOSIS — I255 Ischemic cardiomyopathy: Secondary | ICD-10-CM | POA: Diagnosis not present

## 2016-04-02 DIAGNOSIS — I5022 Chronic systolic (congestive) heart failure: Secondary | ICD-10-CM | POA: Diagnosis not present

## 2016-04-02 DIAGNOSIS — Z7901 Long term (current) use of anticoagulants: Secondary | ICD-10-CM | POA: Diagnosis not present

## 2016-04-02 DIAGNOSIS — I130 Hypertensive heart and chronic kidney disease with heart failure and stage 1 through stage 4 chronic kidney disease, or unspecified chronic kidney disease: Secondary | ICD-10-CM | POA: Diagnosis not present

## 2016-04-02 DIAGNOSIS — Z8249 Family history of ischemic heart disease and other diseases of the circulatory system: Secondary | ICD-10-CM | POA: Diagnosis not present

## 2016-04-02 LAB — CBC
HCT: 37.3 % — ABNORMAL LOW (ref 39.0–52.0)
Hemoglobin: 12.2 g/dL — ABNORMAL LOW (ref 13.0–17.0)
MCH: 28.8 pg (ref 26.0–34.0)
MCHC: 32.7 g/dL (ref 30.0–36.0)
MCV: 88 fL (ref 78.0–100.0)
PLATELETS: 305 10*3/uL (ref 150–400)
RBC: 4.24 MIL/uL (ref 4.22–5.81)
RDW: 13.7 % (ref 11.5–15.5)
WBC: 13.4 10*3/uL — AB (ref 4.0–10.5)

## 2016-04-02 LAB — BRAIN NATRIURETIC PEPTIDE: B NATRIURETIC PEPTIDE 5: 377.6 pg/mL — AB (ref 0.0–100.0)

## 2016-04-02 LAB — COMPREHENSIVE METABOLIC PANEL
ALK PHOS: 63 U/L (ref 38–126)
ALT: 20 U/L (ref 17–63)
ANION GAP: 13 (ref 5–15)
AST: 21 U/L (ref 15–41)
Albumin: 3.9 g/dL (ref 3.5–5.0)
BILIRUBIN TOTAL: 0.9 mg/dL (ref 0.3–1.2)
BUN: 17 mg/dL (ref 6–20)
CALCIUM: 9.1 mg/dL (ref 8.9–10.3)
CO2: 23 mmol/L (ref 22–32)
CREATININE: 1.51 mg/dL — AB (ref 0.61–1.24)
Chloride: 100 mmol/L — ABNORMAL LOW (ref 101–111)
GFR, EST AFRICAN AMERICAN: 50 mL/min — AB (ref >60)
GFR, EST NON AFRICAN AMERICAN: 43 mL/min — AB (ref >60)
Glucose, Bld: 146 mg/dL — ABNORMAL HIGH (ref 65–99)
Potassium: 4.6 mmol/L (ref 3.5–5.1)
SODIUM: 136 mmol/L (ref 135–145)
TOTAL PROTEIN: 8.1 g/dL (ref 6.5–8.1)

## 2016-04-02 LAB — DIGOXIN LEVEL

## 2016-04-02 LAB — PROTIME-INR
INR: 1.35 (ref 0.00–1.49)
PROTHROMBIN TIME: 16.8 s — AB (ref 11.6–15.2)

## 2016-04-02 MED ORDER — ALBUTEROL SULFATE HFA 108 (90 BASE) MCG/ACT IN AERS: 2.0000 | INHALATION_SPRAY | RESPIRATORY_TRACT | Status: DC | PRN

## 2016-04-02 MED ORDER — DIGOXIN 125 MCG PO TABS: 0.1250 mg | ORAL_TABLET | Freq: Every day | ORAL | Status: DC

## 2016-04-02 NOTE — Patient Instructions (Signed)
Labs today  Your physician has requested that you have an echocardiogram. Echocardiography is a painless test that uses sound waves to create images of your heart. It provides your doctor with information about the size and shape of your heart and how well your heart's chambers and valves are working. This procedure takes approximately one hour. There are no restrictions for this procedure.  Heart Catheterization on Fri 5/5, see instruction sheet  Your physician recommends that you schedule a follow-up appointment in: 5-6 weeks

## 2016-04-02 NOTE — Progress Notes (Signed)
ADVANCED HF CLINIC NOTE  Patient ID: Mason Arnold, male   DOB: July 03, 1941, 75 y.o.   MRN: [id number] Patient ID: Mason Arnold, male   DOB: 08/24/1941, 75 y.o.   MRN: [id number] PCP: Dr. Linna Darner Primary cardiologist: Dr. Stanford Breed  Mr. Reville is a 75  yo with history of CAD s/p CABG 2000, ischemic cardiomyopathy (20%) with St Jude CRT-D, DM2, paroxysmal atrial fibrillation, and recurrent syncope of uncertain etiology.    He had RHC/LHC in 4/15 showing patent grafts but elevated filling pressures and low cardiac index (1.9 Fick/2.2 thermo).    Follow up for Heart Failure:  Returns for unscheduled visit due to recurrent syncope. Was feeling ok over the winter and spring. Attended [name] program and finished in March. Has been following weight closely and takes extra lasix or metolazone as needed to keep weight 165-166. Recent Corevue evaluated and shows fluid values have been quite labile. He does drink a lot of fluid on some days.   On 4/18 through 4/21 had at [place] 6 episodes of syncope or presyncope. All started with worsening cough or clearing his throat. Becomes lightheaded and then goes out. No palpitations. No ICD firing. ICD interrogation was ok. Over the past week much more tired with NYHA IIIB symptoms. Has CP when he gets SOB. Occasional orthostasis.     Echo 8/15: EF 15-20% RV moderate dysfunction.   Last cath 4/15  RA = 9 RV = 69/0/13 PA = 61/24 (38) PCW = 29 Fick cardiac output/index = 3.4/1.9 Thermo CO/CI = 4.1 /2.2 PVR = 2.7 Woods FA sat = 94% PA sat = 57%, 57% Ao Pressure: 104/58 (77) LV Pressure: 103/13/29  Left main: Mild plaque LAD: Totally occluded ostially LCX: Small ramus with high-grade ostial disease. AV groove LCX appears occluded in mid section. OM-1 occluded proximally. RCA: Unable to be cannulated. Suspect occluded ostially.  LV-gram done in the RAO projection: Ejection fraction = 15%. Basal inferior and anterior walls only sections that  contract.  LIMA to LAD: Patent SVG to Ramus: Patent SVG to OM-3: Patent SVG to R PDA: Patent. The RCA is occluded prior to take-off of PDA. There are collaterals from distal RCA to OM-1 and probably a septal perforator.  CPX 04/24/14: FVC 2.70 (78%)  FEV1 2.15 (86%)  FEV1/FVC 80%  MVV 84 (80%) Resting HR: 65 Peak HR: 134 (92% age predicted max HR) Peak VO2: 19.6 (76.1% predicted peak VO2) VE/VCO2 slope: 34.8 OUES: 1.58 Peak RER: 1.23 Ventilatory Threshold: 15.0 (58.3% predicted peak VO2) Peak RR 46 Peak Ventilation: 68.1 VE/MVV: 81% PETCO2 at peak: 30 O2pulse: 11 (76% predicted O2pulse)  Labs (4/15): K 4.7, creatinine 1.4 Labs (5/15): K 3.7 Cr 1.56 pBNP 1,353 Labs (8/15): K 4.8, creatinine 1.6, pBNP 724 Labs (11/13/2014): K 3.6 Creatinine 1.79 dig level 0.5   PMH: 1. HTN 2. Hyperlipidemia 3. CAD: s/p CABG with LIMA-LAD, SVG-ramus, SVG-OM3, SVG-PDA.  LHC (4/15) with patent grafts.  4. Ischemic cardiomyopathy: Echo (8/14) with EF 20-25% with regional WMAs, mild MR, mildly decreased RV systolic function.  EF LV-gram (4/15) was 15%. He has a [employer] officer, political party CRT-D device (upgraded 1/14).  RHC (4/15): mean RA 9, PA 61/24 (mean 38), mean PCWP 29, CI 1.9 Fick/2.2 thermo, PVR 2.7. CPX (04/2014): Peak VO2: 19.6 (76% predicted peak VO2), VE/VCO2: 34.8, Peak RER 1.23.  Echo (10/15) with EF 20-25% with regional wall motion abnormalities, normal RV size with mild to moderately decreased systolic function, mild AS, mild MR.  5. Syncopal episodes:  Relatively frequent, etiology uncertain.  He does get lightheaded/near-syncopal with coughing spells, but he has syncope at other times also with no warning.  Since device has been in place, he has had VT detected but it has not been clearly correlated with syncopal episodes. Suspect vasomotor.  6. Atrial fibrillation: Paroxysmal.  Has not been anticoagulated due to relatively frequent syncope/falls.  7. Gout 8. H/o LBBB 9. Type II diabetes 10. CKD  SH:  Married, lives in [place], nonsmoker, rare ETOH.   FH: CAD  ROS: All systems reviewed and negative except as per HPI.   Current Outpatient Prescriptions  Medication Sig Dispense Refill  . acetaminophen (TYLENOL) 500 MG tablet Take 500 mg by mouth every 6 (six) hours as needed. For pain    . albuterol (PROVENTIL HFA;VENTOLIN HFA) 108 (90 BASE) MCG/ACT inhaler Inhale 2 puffs into the lungs every 4 (four) hours as needed for wheezing. 1 Inhaler 6  . allopurinol (ZYLOPRIM) 100 MG tablet Take 1 tablet (100 mg total) by mouth daily. 30 tablet 6  . calcium carbonate (OS-CAL) 600 MG TABS Take 600 mg by mouth daily.      . carvedilol (COREG) 3.125 MG tablet TAKE 1 TABLET BY MOUTH TWICE DAILY WITH MEALS 60 tablet 3  . colchicine 0.6 MG tablet Take 0.6 mg by mouth daily as needed (gouty flare).    Marland Kitchen digoxin (LANOXIN) 0.125 MG tablet Take 1 tablet (0.125 mg total) by mouth daily. 30 tablet 3  . ELIQUIS 5 MG TABS tablet TAKE 1 TABLET BY MOUTH TWICE A DAY 60 tablet 3  . famotidine (PEPCID) 10 MG tablet Take 10 mg by mouth daily.     Marland Kitchen FLUoxetine (PROZAC) 20 MG capsule Take 1 capsule (20 mg total) by mouth daily. 90 capsule 1  . furosemide (LASIX) 80 MG tablet Take 1 tablet (80 mg total) by mouth 2 (two) times daily. 60 tablet 6  . insulin glargine (LANTUS) 100 UNIT/ML injection Inject 90 Units into the skin at bedtime. Sliding scale    . loratadine (CLARITIN) 10 MG tablet Take 10 mg by mouth daily as needed. For allergies    . metolazone (ZAROXOLYN) 2.5 MG tablet Take 2.5 mg by mouth as needed (for weight gain).    . Multiple Vitamin (MULTIVITAMIN) tablet Take 1 tablet by mouth daily.     Marland Kitchen omega-3 acid ethyl esters (LOVAZA) 1 G capsule Take 2 g by mouth 2 (two) times daily.      . potassium chloride SA (K-DUR,KLOR-CON) 20 MEQ tablet Take 20 mEq by mouth daily.    . pravastatin (PRAVACHOL) 80 MG tablet Take 1 tablet (80 mg total) by mouth every evening. 90 tablet 3  . ramipril (ALTACE) 2.5 MG capsule  TAKE 1 CAPSULE BY MOUTH ONCE A DAY 90 capsule 0  . spironolactone (ALDACTONE) 25 MG tablet Take 25 mg by mouth daily.      . tamsulosin (FLOMAX) 0.4 MG CAPS capsule Take 0.4 mg by mouth daily after supper.     No current facility-administered medications for this encounter.   Sitting 108/68 Standing 110/70  BP 106/64 mmHg  Pulse 62  Wt 170 lb 8 oz (77.338 kg)  SpO2 99% General: NAD wife present  Neck: JVP flat, no thyromegaly or thyroid nodule.  Lungs: Clear to auscultation bilaterally with normal respiratory effort. CV: Nondisplaced PMI.  Heart regular S1/S2, no S3/S4, no murmur.  No peripheral edema.  No carotid bruit.  Normal pedal pulses.  Abdomen: Soft, nontender, no hepatosplenomegaly, no  distention.  Skin: Intact without lesions or rashes.  Neurologic: Alert and oriented x 3.  Psych: Normal affect. Extremities: No clubbing or cyanosis.   Assessment/Plan:  1, Recurrent syncope  -- I suspect this is likely cough syncope related to poor RV filling in setting of high intrathoracic pressures and I worry it may be a sign of worsening HF for him particularly with his progressive exertional symptoms.  -- Will repeat echo. Plan RHC  2. Chronic systolic CHF: Ischemic cardiomyopathy s/p St Jude CRT-D, EF 20-25% (09/2014) and again today.  CO was low on RHC but CPX actually looked reasonably good in 2015.   --Continues with functional decline. Now NYHA IIIB despite careful volume management. I worry about recurrent low output.  Will repeat echo and proceed with RHCes. - Will continue lasix 80 mg BID and metolazone as needed for weight 174 lbs or greater.  - Continue current ramipril, spironolactone, digoxin and Coreg.  - Reinforced the need and importance of daily weights, a low sodium diet, and fluid restriction (less than 2 L a day). Instructed to call the HF clinic if weight increases more than 3 lbs overnight or 5 lbs in a week.  3. CAD: Patent grafts on LHC in 4/15.  No chest pain.  Continue statin.  He is on Eliquis so no longer taking aspirin.   4. Paroxysmal atrial fibrillation: In NSR today. Continue eliquis 5 mg twice a day. No recent falls.   .Total time spent 45 minutes. Over half that time spent discussing above.    Glori Bickers  MD  04/02/2016

## 2016-04-03 ENCOUNTER — Other Ambulatory Visit (HOSPITAL_COMMUNITY): Payer: Self-pay | Admitting: *Deleted

## 2016-04-03 DIAGNOSIS — I5022 Chronic systolic (congestive) heart failure: Secondary | ICD-10-CM

## 2016-04-11 ENCOUNTER — Encounter (HOSPITAL_COMMUNITY): Payer: Self-pay | Admitting: Internal Medicine

## 2016-04-11 ENCOUNTER — Ambulatory Visit (HOSPITAL_COMMUNITY)
Admission: RE | Admit: 2016-04-11 | Discharge: 2016-04-11 | Disposition: A | Discharge status: Home / Self Care | Payer: Medicare Other | Source: Ambulatory Visit | Attending: Internal Medicine | Admitting: Internal Medicine

## 2016-04-11 ENCOUNTER — Encounter (HOSPITAL_COMMUNITY)
Admission: RE | Disposition: A | Discharge status: Home / Self Care | Payer: Self-pay | Source: Ambulatory Visit | Attending: Internal Medicine

## 2016-04-11 DIAGNOSIS — R55 Syncope and collapse: Secondary | ICD-10-CM | POA: Diagnosis not present

## 2016-04-11 DIAGNOSIS — I255 Ischemic cardiomyopathy: Secondary | ICD-10-CM | POA: Insufficient documentation

## 2016-04-11 DIAGNOSIS — N189 Chronic kidney disease, unspecified: Secondary | ICD-10-CM | POA: Insufficient documentation

## 2016-04-11 DIAGNOSIS — E119 Type 2 diabetes mellitus without complications: Secondary | ICD-10-CM | POA: Diagnosis not present

## 2016-04-11 DIAGNOSIS — I480 Paroxysmal atrial fibrillation: Secondary | ICD-10-CM | POA: Insufficient documentation

## 2016-04-11 DIAGNOSIS — Z8249 Family history of ischemic heart disease and other diseases of the circulatory system: Secondary | ICD-10-CM | POA: Diagnosis not present

## 2016-04-11 DIAGNOSIS — I2510 Atherosclerotic heart disease of native coronary artery without angina pectoris: Secondary | ICD-10-CM | POA: Insufficient documentation

## 2016-04-11 DIAGNOSIS — Z955 Presence of coronary angioplasty implant and graft: Secondary | ICD-10-CM | POA: Insufficient documentation

## 2016-04-11 DIAGNOSIS — Z794 Long term (current) use of insulin: Secondary | ICD-10-CM | POA: Insufficient documentation

## 2016-04-11 DIAGNOSIS — I5022 Chronic systolic (congestive) heart failure: Secondary | ICD-10-CM | POA: Diagnosis not present

## 2016-04-11 DIAGNOSIS — I130 Hypertensive heart and chronic kidney disease with heart failure and stage 1 through stage 4 chronic kidney disease, or unspecified chronic kidney disease: Secondary | ICD-10-CM | POA: Diagnosis not present

## 2016-04-11 HISTORY — PX: CARDIAC CATHETERIZATION: SHX172

## 2016-04-11 LAB — POCT I-STAT 3, VENOUS BLOOD GAS (G3P V)
ACID-BASE EXCESS: 1 mmol/L (ref 0.0–2.0)
ACID-BASE EXCESS: 2 mmol/L (ref 0.0–2.0)
Bicarbonate: 25.8 mEq/L — ABNORMAL HIGH (ref 20.0–24.0)
Bicarbonate: 27.4 mEq/L — ABNORMAL HIGH (ref 20.0–24.0)
O2 SAT: 57 %
O2 SAT: 60 %
PCO2 VEN: 44 mmHg — AB (ref 45.0–50.0)
TCO2: 27 mmol/L (ref 0–100)
TCO2: 29 mmol/L (ref 0–100)
pCO2, Ven: 41.3 mmHg — ABNORMAL LOW (ref 45.0–50.0)
pH, Ven: 7.403 — ABNORMAL HIGH (ref 7.250–7.300)
pH, Ven: 7.403 — ABNORMAL HIGH (ref 7.250–7.300)
pO2, Ven: 30 mmHg — ABNORMAL LOW (ref 31.0–45.0)
pO2, Ven: 31 mmHg (ref 31.0–45.0)

## 2016-04-11 LAB — GLUCOSE, CAPILLARY: Glucose-Capillary: 116 mg/dL — ABNORMAL HIGH (ref 65–99)

## 2016-04-11 SURGERY — RIGHT HEART CATH
Anesthesia: LOCAL

## 2016-04-11 MED ORDER — LIDOCAINE HCL (PF) 1 % IJ SOLN
INTRAMUSCULAR | Status: DC | PRN
  Administered 2016-04-11: 2 mL

## 2016-04-11 MED ORDER — ONDANSETRON HCL 4 MG/2ML IJ SOLN: 4.0000 mg | Freq: Four times a day (QID) | INTRAMUSCULAR | Status: DC | PRN

## 2016-04-11 MED ORDER — SODIUM CHLORIDE 0.9% FLUSH: 3.0000 mL | Freq: Two times a day (BID) | INTRAVENOUS | Status: DC

## 2016-04-11 MED ORDER — SODIUM CHLORIDE 0.9 % IV SOLN: 250.0000 mL | INTRAVENOUS | Status: DC | PRN

## 2016-04-11 MED ORDER — SODIUM CHLORIDE 0.9 % IV SOLN
250.0000 mL | INTRAVENOUS | Status: DC | PRN
Start: 2016-04-11 — End: 2016-04-11

## 2016-04-11 MED ORDER — SODIUM CHLORIDE 0.9 % IV SOLN: INTRAVENOUS | Status: DC

## 2016-04-11 MED ORDER — SODIUM CHLORIDE 0.9% FLUSH: 3.0000 mL | INTRAVENOUS | Status: DC | PRN

## 2016-04-11 MED ORDER — ACETAMINOPHEN 325 MG PO TABS: 650.0000 mg | ORAL_TABLET | ORAL | Status: DC | PRN

## 2016-04-11 MED ORDER — HEPARIN (PORCINE) IN NACL 2-0.9 UNIT/ML-% IJ SOLN
INTRAMUSCULAR | Status: AC
  Filled 2016-04-11: qty 500

## 2016-04-11 MED ORDER — LIDOCAINE HCL (PF) 1 % IJ SOLN
INTRAMUSCULAR | Status: AC
  Filled 2016-04-11: qty 30

## 2016-04-11 MED ORDER — ASPIRIN 81 MG PO CHEW: 81.0000 mg | CHEWABLE_TABLET | ORAL | Status: DC

## 2016-04-11 SURGICAL SUPPLY — 8 items
CATH BALLN WEDGE 5F 110CM (CATHETERS) ×2 IMPLANT
PACK CARDIAC CATHETERIZATION (CUSTOM PROCEDURE TRAY) ×2 IMPLANT
PROTECTION STATION PRESSURIZED (MISCELLANEOUS) ×2
SHEATH FAST CATH BRACH 5F 5CM (SHEATH) ×2 IMPLANT
STATION PROTECTION PRESSURIZED (MISCELLANEOUS) ×1 IMPLANT
TRANSDUCER W/STOPCOCK (MISCELLANEOUS) ×2 IMPLANT
TUBING ART PRESS 72  MALE/FEM (TUBING) ×1
TUBING ART PRESS 72 MALE/FEM (TUBING) ×1 IMPLANT

## 2016-04-11 NOTE — Interval H&P Note (Signed)
History and Physical Interval Note:  04/11/2016 7:47 AM  Mason Arnold  has presented today for surgery, with the diagnosis of chf  The various methods of treatment have been discussed with the patient and family. After consideration of risks, benefits and other options for treatment, the patient has consented to  Procedure(s): Right Heart Cath (N/A) as a surgical intervention .  The patient's history has been reviewed, patient examined, no change in status, stable for surgery.  I have reviewed the patient's chart and labs.  Questions were answered to the patient's satisfaction.     Brynleigh Sequeira, Quillian Quince

## 2016-04-11 NOTE — Discharge Instructions (Signed)
Angiogram, Care After °Refer to this sheet in the next few weeks. These instructions provide you with information about caring for yourself after your procedure. Your health care provider may also give you more specific instructions. Your treatment has been planned according to current medical practices, but problems sometimes occur. Call your health care provider if you have any problems or questions after your procedure. °WHAT TO EXPECT AFTER THE PROCEDURE °After your procedure, it is typical to have the following: °· Bruising at the catheter insertion site that usually fades within 1-2 weeks. °· Blood collecting in the tissue (hematoma) that may be painful to the touch. It should usually decrease in size and tenderness within 1-2 weeks. °HOME CARE INSTRUCTIONS °· Take medicines only as directed by your health care provider. °· You may shower 24-48 hours after the procedure or as directed by your health care provider. Remove the bandage (dressing) and gently wash the site with plain soap and water. Pat the area dry with a clean towel. Do not rub the site, because this may cause bleeding. °· Do not take baths, swim, or use a hot tub until your health care provider approves. °· Check your insertion site every day for redness, swelling, or drainage. °· Do not apply powder or lotion to the site. °· Do not lift over 10 lb (4.5 kg) for 5 days after your procedure or as directed by your health care provider. °· Ask your health care provider when it is okay to: °¨ Return to work or school. °¨ Resume usual physical activities or sports. °¨ Resume sexual activity. °· Do not drive home if you are discharged the same day as the procedure. Have someone else drive you. °· You may drive 24 hours after the procedure unless otherwise instructed by your health care provider. °· Do not operate machinery or power tools for 24 hours after the procedure or as directed by your health care provider. °· If your procedure was done as an  outpatient procedure, which means that you went home the same day as your procedure, a responsible adult should be with you for the first 24 hours after you arrive home. °· Keep all follow-up visits as directed by your health care provider. This is important. °SEEK MEDICAL CARE IF: °· You have a fever. °· You have chills. °· You have increased bleeding from the catheter insertion site. Hold pressure on the site. °SEEK IMMEDIATE MEDICAL CARE IF: °· You have unusual pain at the catheter insertion site. °· You have redness, warmth, or swelling at the catheter insertion site. °· You have drainage (other than a small amount of blood on the dressing) from the catheter insertion site. °· The catheter insertion site is bleeding, and the bleeding does not stop after 30 minutes of holding steady pressure on the site. °· The area near or just beyond the catheter insertion site becomes pale, cool, tingly, or numb. °  °This information is not intended to replace advice given to you by your health care provider. Make sure you discuss any questions you have with your health care provider. °  °Document Released: 06/12/2005 Document Revised: 12/15/2014 Document Reviewed: 04/27/2013 °Elsevier Interactive Patient Education ©2016 [employer] ° °

## 2016-04-11 NOTE — H&P (View-Only) (Signed)
ADVANCED HF CLINIC NOTE  Patient ID: Mason Arnold, male   DOB: November 26, 1941, 75 y.o.   MRN: [id number] Patient ID: Mason Arnold, male   DOB: 18-Nov-1941, 75 y.o.   MRN: [id number] PCP: Dr. Linna Arnold Primary cardiologist: Dr. Stanford Arnold  [name] is a 75  yo with history of CAD s/p CABG 2000, ischemic cardiomyopathy (20%) with St Jude CRT-D, DM2, paroxysmal atrial fibrillation, and recurrent syncope of uncertain etiology.    He had RHC/LHC in 4/15 showing patent grafts but elevated filling pressures and low cardiac index (1.9 Fick/2.2 thermo).    Follow up for Heart Failure:  Returns for unscheduled visit due to recurrent syncope. Was feeling ok over the winter and spring. Attended [facility] HF program and finished in March. Has been following weight closely and takes extra lasix or metolazone as needed to keep weight 165-166. Recent Corevue evaluated and shows fluid values have been quite labile. He does drink a lot of fluid on some days.   On 4/18 through 4/21 had at [place] 6 episodes of syncope or presyncope. All started with worsening cough or clearing his throat. Becomes lightheaded and then goes out. No palpitations. No ICD firing. ICD interrogation was ok. Over the past week much more tired with NYHA IIIB symptoms. Has CP when he gets SOB. Occasional orthostasis.     Echo 8/15: EF 15-20% RV moderate dysfunction.   Last cath 4/15  RA = 9 RV = 69/0/13 PA = 61/24 (38) PCW = 29 Fick cardiac output/index = 3.4/1.9 Thermo CO/CI = 4.1 /2.2 PVR = 2.7 Woods FA sat = 94% PA sat = 57%, 57% Ao Pressure: 104/58 (77) LV Pressure: 103/13/29  Left main: Mild plaque LAD: Totally occluded ostially LCX: Small ramus with high-grade ostial disease. AV groove LCX appears occluded in mid section. OM-1 occluded proximally. RCA: Unable to be cannulated. Suspect occluded ostially.  LV-gram done in the RAO projection: Ejection fraction = 15%. Basal inferior and anterior walls only sections that  contract.  LIMA to LAD: Patent SVG to Ramus: Patent SVG to OM-3: Patent SVG to R PDA: Patent. The RCA is occluded prior to take-off of PDA. There are collaterals from distal RCA to OM-1 and probably a septal perforator.  CPX 04/24/14: FVC 2.70 (78%)  FEV1 2.15 (86%)  FEV1/FVC 80%  MVV 84 (80%) Resting HR: 65 Peak HR: 134 (92% age predicted max HR) Peak VO2: 19.6 (76.1% predicted peak VO2) VE/VCO2 slope: 34.8 OUES: 1.58 Peak RER: 1.23 Ventilatory Threshold: 15.0 (58.3% predicted peak VO2) Peak RR 46 Peak Ventilation: 68.1 VE/MVV: 81% PETCO2 at peak: 30 O2pulse: 11 (76% predicted O2pulse)  Labs (4/15): K 4.7, creatinine 1.4 Labs (5/15): K 3.7 Cr 1.56 pBNP 1,353 Labs (8/15): K 4.8, creatinine 1.6, pBNP 724 Labs (11/13/2014): K 3.6 Creatinine 1.79 dig level 0.5   PMH: 1. HTN 2. Hyperlipidemia 3. CAD: s/p CABG with LIMA-LAD, SVG-ramus, SVG-OM3, SVG-PDA.  LHC (4/15) with patent grafts.  4. Ischemic cardiomyopathy: Echo (8/14) with EF 20-25% with regional WMAs, mild MR, mildly decreased RV systolic function.  EF LV-gram (4/15) was 15%. He has a [employer] officer, political party CRT-D device (upgraded 1/14).  RHC (4/15): mean RA 9, PA 61/24 (mean 38), mean PCWP 29, CI 1.9 Fick/2.2 thermo, PVR 2.7. CPX (04/2014): Peak VO2: 19.6 (76% predicted peak VO2), VE/VCO2: 34.8, Peak RER 1.23.  Echo (10/15) with EF 20-25% with regional wall motion abnormalities, normal RV size with mild to moderately decreased systolic function, mild AS, mild MR.  5. Syncopal episodes:  Relatively frequent, etiology uncertain.  He does get lightheaded/near-syncopal with coughing spells, but he has syncope at other times also with no warning.  Since device has been in place, he has had VT detected but it has not been clearly correlated with syncopal episodes. Suspect vasomotor.  6. Atrial fibrillation: Paroxysmal.  Has not been anticoagulated due to relatively frequent syncope/falls.  7. Gout 8. H/o LBBB 9. Type II diabetes 10. CKD  SH:  Married, lives in [place], nonsmoker, rare ETOH.   FH: CAD  ROS: All systems reviewed and negative except as per HPI.   Current Outpatient Prescriptions  Medication Sig Dispense Refill  . acetaminophen (TYLENOL) 500 MG tablet Take 500 mg by mouth every 6 (six) hours as needed. For pain    . albuterol (PROVENTIL HFA;VENTOLIN HFA) 108 (90 BASE) MCG/ACT inhaler Inhale 2 puffs into the lungs every 4 (four) hours as needed for wheezing. 1 Inhaler 6  . allopurinol (ZYLOPRIM) 100 MG tablet Take 1 tablet (100 mg total) by mouth daily. 30 tablet 6  . calcium carbonate (OS-CAL) 600 MG TABS Take 600 mg by mouth daily.      . carvedilol (COREG) 3.125 MG tablet TAKE 1 TABLET BY MOUTH TWICE DAILY WITH MEALS 60 tablet 3  . colchicine 0.6 MG tablet Take 0.6 mg by mouth daily as needed (gouty flare).    Marland Kitchen digoxin (LANOXIN) 0.125 MG tablet Take 1 tablet (0.125 mg total) by mouth daily. 30 tablet 3  . ELIQUIS 5 MG TABS tablet TAKE 1 TABLET BY MOUTH TWICE A DAY 60 tablet 3  . famotidine (PEPCID) 10 MG tablet Take 10 mg by mouth daily.     Marland Kitchen FLUoxetine (PROZAC) 20 MG capsule Take 1 capsule (20 mg total) by mouth daily. 90 capsule 1  . furosemide (LASIX) 80 MG tablet Take 1 tablet (80 mg total) by mouth 2 (two) times daily. 60 tablet 6  . insulin glargine (LANTUS) 100 UNIT/ML injection Inject 90 Units into the skin at bedtime. Sliding scale    . loratadine (CLARITIN) 10 MG tablet Take 10 mg by mouth daily as needed. For allergies    . metolazone (ZAROXOLYN) 2.5 MG tablet Take 2.5 mg by mouth as needed (for weight gain).    . Multiple Vitamin (MULTIVITAMIN) tablet Take 1 tablet by mouth daily.     Marland Kitchen omega-3 acid ethyl esters (LOVAZA) 1 G capsule Take 2 g by mouth 2 (two) times daily.      . potassium chloride SA (K-DUR,KLOR-CON) 20 MEQ tablet Take 20 mEq by mouth daily.    . pravastatin (PRAVACHOL) 80 MG tablet Take 1 tablet (80 mg total) by mouth every evening. 90 tablet 3  . ramipril (ALTACE) 2.5 MG capsule  TAKE 1 CAPSULE BY MOUTH ONCE A DAY 90 capsule 0  . spironolactone (ALDACTONE) 25 MG tablet Take 25 mg by mouth daily.      . tamsulosin (FLOMAX) 0.4 MG CAPS capsule Take 0.4 mg by mouth daily after supper.     No current facility-administered medications for this encounter.   Sitting 108/68 Standing 110/70  BP 106/64 mmHg  Pulse 62  Wt 170 lb 8 oz (77.338 kg)  SpO2 99% General: NAD wife present  Neck: JVP flat, no thyromegaly or thyroid nodule.  Lungs: Clear to auscultation bilaterally with normal respiratory effort. CV: Nondisplaced PMI.  Heart regular S1/S2, no S3/S4, no murmur.  No peripheral edema.  No carotid bruit.  Normal pedal pulses.  Abdomen: Soft, nontender, no hepatosplenomegaly, no  distention.  Skin: Intact without lesions or rashes.  Neurologic: Alert and oriented x 3.  Psych: Normal affect. Extremities: No clubbing or cyanosis.   Assessment/Plan:  1, Recurrent syncope  -- I suspect this is likely cough syncope related to poor RV filling in setting of high intrathoracic pressures and I worry it may be a sign of worsening HF for him particularly with his progressive exertional symptoms.  -- Will repeat echo. Plan RHC  2. Chronic systolic CHF: Ischemic cardiomyopathy s/p St Jude CRT-D, EF 20-25% (09/2014) and again today.  CO was low on RHC but CPX actually looked reasonably good in 2015.   --Continues with functional decline. Now NYHA IIIB despite careful volume management. I worry about recurrent low output.  Will repeat echo and proceed with RHCes. - Will continue lasix 80 mg BID and metolazone as needed for weight 174 lbs or greater.  - Continue current ramipril, spironolactone, digoxin and Coreg.  - Reinforced the need and importance of daily weights, a low sodium diet, and fluid restriction (less than 2 L a day). Instructed to call the HF clinic if weight increases more than 3 lbs overnight or 5 lbs in a week.  3. CAD: Patent grafts on LHC in 4/15.  No chest pain.  Continue statin.  He is on Eliquis so no longer taking aspirin.   4. Paroxysmal atrial fibrillation: In NSR today. Continue eliquis 5 mg twice a day. No recent falls.   .Total time spent 45 minutes. Over half that time spent discussing above.    Glori Bickers  MD  04/02/2016

## 2016-04-14 MED FILL — Heparin Sodium (Porcine) 2 Unit/ML in Sodium Chloride 0.9%: INTRAMUSCULAR | Qty: 500 | Status: AC

## 2016-04-16 ENCOUNTER — Other Ambulatory Visit: Payer: Self-pay

## 2016-04-16 ENCOUNTER — Ambulatory Visit (HOSPITAL_COMMUNITY): Payer: Medicare Other | Attending: Internal Medicine

## 2016-04-16 DIAGNOSIS — I371 Nonrheumatic pulmonary valve insufficiency: Secondary | ICD-10-CM | POA: Insufficient documentation

## 2016-04-16 DIAGNOSIS — I2510 Atherosclerotic heart disease of native coronary artery without angina pectoris: Secondary | ICD-10-CM | POA: Diagnosis not present

## 2016-04-16 DIAGNOSIS — I351 Nonrheumatic aortic (valve) insufficiency: Secondary | ICD-10-CM | POA: Diagnosis not present

## 2016-04-16 DIAGNOSIS — I5022 Chronic systolic (congestive) heart failure: Secondary | ICD-10-CM | POA: Diagnosis not present

## 2016-04-16 DIAGNOSIS — R29898 Other symptoms and signs involving the musculoskeletal system: Secondary | ICD-10-CM | POA: Diagnosis not present

## 2016-04-16 DIAGNOSIS — E785 Hyperlipidemia, unspecified: Secondary | ICD-10-CM | POA: Insufficient documentation

## 2016-04-16 DIAGNOSIS — Z951 Presence of aortocoronary bypass graft: Secondary | ICD-10-CM | POA: Diagnosis not present

## 2016-04-16 DIAGNOSIS — I071 Rheumatic tricuspid insufficiency: Secondary | ICD-10-CM | POA: Insufficient documentation

## 2016-04-16 DIAGNOSIS — Z95810 Presence of automatic (implantable) cardiac defibrillator: Secondary | ICD-10-CM | POA: Insufficient documentation

## 2016-04-16 DIAGNOSIS — I358 Other nonrheumatic aortic valve disorders: Secondary | ICD-10-CM | POA: Diagnosis not present

## 2016-04-16 DIAGNOSIS — R55 Syncope and collapse: Secondary | ICD-10-CM | POA: Insufficient documentation

## 2016-04-16 DIAGNOSIS — I340 Nonrheumatic mitral (valve) insufficiency: Secondary | ICD-10-CM | POA: Diagnosis not present

## 2016-04-16 DIAGNOSIS — I509 Heart failure, unspecified: Secondary | ICD-10-CM | POA: Diagnosis present

## 2016-04-16 DIAGNOSIS — I110 Hypertensive heart disease with heart failure: Secondary | ICD-10-CM | POA: Diagnosis not present

## 2016-04-22 ENCOUNTER — Encounter: Payer: Self-pay | Admitting: Internal Medicine

## 2016-04-23 ENCOUNTER — Ambulatory Visit (INDEPENDENT_AMBULATORY_CARE_PROVIDER_SITE_OTHER): Payer: Medicare Other | Admitting: *Deleted

## 2016-04-23 DIAGNOSIS — Z95810 Presence of automatic (implantable) cardiac defibrillator: Secondary | ICD-10-CM | POA: Diagnosis not present

## 2016-04-23 DIAGNOSIS — I255 Ischemic cardiomyopathy: Secondary | ICD-10-CM

## 2016-04-23 NOTE — Progress Notes (Signed)
Remote ICD transmission.   

## 2016-04-24 NOTE — Progress Notes (Signed)
EPIC Encounter for ICM Monitoring  Patient Name: Mason Arnold is a 75 y.o. male Date: 04/24/2016 Primary Care Physican: Binnie Rail, MD Primary Cardiologist: [place] Electrophysiologist: Faustino Congress Weight: 163.2 lbs   Bi-V Pacing 99%      In the past month, have you:  1. Gained more than 2 pounds in a day or more than 5 pounds in a week? no  2. Had changes in your medications (with verification of current medications)? no  3. Had more shortness of breath than is usual for you? no  4. Limited your activity because of shortness of breath? no  5. Not been able to sleep because of shortness of breath? no  6. Had increased swelling in your feet, ankles, legs or stomach area? no  7. Had symptoms of dehydration (dizziness, dry mouth, increased thirst, decreased urine output) no  8. Had changes in sodium restriction? no  9. Been compliant with medication? Yes  ICM trend: 3 month view for 04/23/2016   ICM trend: 1 year view for 04/23/2016   Follow-up plan: ICM clinic phone appointment 05/28/2016.   Office appointment with Dr Haroldine Laws on 05/12/2016.    FLUID LEVELS:  Corvue thoracic impedance trending along baseline suggesting stable fluid levels.    SYMPTOMS:  None.  Denied any symptoms such as weight gain of 3 pounds overnight or 5 pounds within a week, SOB and/or lower extremity swelling. Encouraged to call for any fluid symptoms.   EDUCATION: Limit sodium intake to < 2000 mg and fluid intake to 64 oz daily.   No changes today.     Rosalene Billings, RN, CCM 04/24/2016 1:51 PM

## 2016-05-12 ENCOUNTER — Encounter (HOSPITAL_COMMUNITY): Payer: Self-pay | Admitting: Internal Medicine

## 2016-05-12 ENCOUNTER — Ambulatory Visit (HOSPITAL_COMMUNITY)
Admission: RE | Admit: 2016-05-12 | Discharge: 2016-05-12 | Disposition: A | Discharge status: Home / Self Care | Payer: Medicare Other | Source: Ambulatory Visit | Attending: Internal Medicine | Admitting: Internal Medicine

## 2016-05-12 VITALS — BP 106/62 | HR 62 | Wt 169.5 lb

## 2016-05-12 DIAGNOSIS — Z951 Presence of aortocoronary bypass graft: Secondary | ICD-10-CM | POA: Diagnosis not present

## 2016-05-12 DIAGNOSIS — R55 Syncope and collapse: Secondary | ICD-10-CM | POA: Diagnosis not present

## 2016-05-12 DIAGNOSIS — I2510 Atherosclerotic heart disease of native coronary artery without angina pectoris: Secondary | ICD-10-CM | POA: Diagnosis not present

## 2016-05-12 DIAGNOSIS — Z794 Long term (current) use of insulin: Secondary | ICD-10-CM | POA: Insufficient documentation

## 2016-05-12 DIAGNOSIS — I480 Paroxysmal atrial fibrillation: Secondary | ICD-10-CM | POA: Diagnosis not present

## 2016-05-12 DIAGNOSIS — M109 Gout, unspecified: Secondary | ICD-10-CM | POA: Insufficient documentation

## 2016-05-12 DIAGNOSIS — Z79899 Other long term (current) drug therapy: Secondary | ICD-10-CM | POA: Insufficient documentation

## 2016-05-12 DIAGNOSIS — Z8249 Family history of ischemic heart disease and other diseases of the circulatory system: Secondary | ICD-10-CM | POA: Insufficient documentation

## 2016-05-12 DIAGNOSIS — Z95810 Presence of automatic (implantable) cardiac defibrillator: Secondary | ICD-10-CM | POA: Diagnosis not present

## 2016-05-12 DIAGNOSIS — E785 Hyperlipidemia, unspecified: Secondary | ICD-10-CM | POA: Diagnosis not present

## 2016-05-12 DIAGNOSIS — I255 Ischemic cardiomyopathy: Secondary | ICD-10-CM | POA: Diagnosis not present

## 2016-05-12 DIAGNOSIS — N189 Chronic kidney disease, unspecified: Secondary | ICD-10-CM | POA: Diagnosis not present

## 2016-05-12 DIAGNOSIS — I5022 Chronic systolic (congestive) heart failure: Secondary | ICD-10-CM | POA: Diagnosis not present

## 2016-05-12 DIAGNOSIS — E1122 Type 2 diabetes mellitus with diabetic chronic kidney disease: Secondary | ICD-10-CM | POA: Insufficient documentation

## 2016-05-12 DIAGNOSIS — Z7901 Long term (current) use of anticoagulants: Secondary | ICD-10-CM | POA: Insufficient documentation

## 2016-05-12 DIAGNOSIS — I130 Hypertensive heart and chronic kidney disease with heart failure and stage 1 through stage 4 chronic kidney disease, or unspecified chronic kidney disease: Secondary | ICD-10-CM | POA: Insufficient documentation

## 2016-05-12 NOTE — Patient Instructions (Signed)
Your physician recommends that you schedule a follow-up appointment in: 3 months.  

## 2016-05-12 NOTE — Progress Notes (Signed)
Patient ID: Mason Arnold, male   DOB: Oct 07, 1941, 75 y.o.   MRN: [id number]   ADVANCED HF CLINIC NOTE PCP: Dr. Linna Arnold Primary cardiologist: Dr. Stanford Arnold  Mason Arnold is a 75  yo with history of CAD s/p CABG 2000, ischemic cardiomyopathy (20%) with St Jude CRT-D, DM2, paroxysmal atrial fibrillation, and recurrent syncope of uncertain etiology.    He had RHC/LHC in 4/15 showing patent grafts but elevated filling pressures and low cardiac index (1.9 Fick/2.2 thermo).   Follow up for Heart Failure: Last visit he had multiple episodes of syncope. Resolved spontaneously. Had [place] with normal hemodynamics. ECHO unchanged. Overall feeling much better. Has not had syncope since April 18th. Weight at home 161-167 pounds. Denies SOB/PND/Orthopnea. [facility]  Taking [facility] medications.   ECHO 04/16/2016: EF 22% IVC normal.  Echo 8/15: EF 15-20% RV moderate dysfunction.   Mason Arnold 04/2016 RA = 7 RV = 48/2/8 PA = 54/20 (33) PCW = 19 Fick cardiac output/index = 4.2/2.4 PVR = 3.3 WU Ao sat = 93% PA sat = 57%, 60%  Last cath 4/15 RA = 9 RV = 69/0/13 PA = 61/24 (38) PCW = 29 Fick cardiac output/index = 3.4/1.9 Thermo CO/CI = 4.1 /2.2 PVR = 2.7 Woods FA sat = 94% PA sat = 57%, 57% Ao Pressure: 104/58 (77) LV Pressure: 103/13/29  Left main: Mild plaque LAD: Totally occluded ostially LCX: Small ramus with high-grade ostial disease. AV groove LCX appears occluded in mid section. OM-1 occluded proximally. Mason Arnold: Unable to be cannulated. Suspect occluded ostially.  LV-gram done in the RAO projection: Ejection fraction = 15%. Basal inferior and anterior walls only sections that contract.  LIMA to LAD: Patent SVG to Ramus: Patent SVG to OM-3: Patent SVG to R PDA: Patent. The Mason Arnold is occluded prior to take-off of PDA. There are collaterals from distal Mason Arnold to OM-1 and probably a septal perforator.  CPX 04/24/14: FVC 2.70 (78%)  FEV1 2.15 (86%)  FEV1/FVC 80%  MVV 84 (80%) Resting HR: 65  Peak HR: 134 (92% age predicted max HR) Peak VO2: 19.6 (76.1% predicted peak VO2) VE/VCO2 slope: 34.8 OUES: 1.58 Peak RER: 1.23 Ventilatory Threshold: 15.0 (58.3% predicted peak VO2) Peak RR 46 Peak Ventilation: 68.1 VE/MVV: 81% PETCO2 at peak: 30 O2pulse: 11 (76% predicted O2pulse)  Labs (4/15): K 4.7, creatinine 1.4 Labs (5/15): K 3.7 Cr 1.56 pBNP 1,353 Labs (8/15): K 4.8, creatinine 1.6, pBNP 724 Labs (11/13/2014): K 3.6 Creatinine 1.79 dig level 0.5   PMH: 1. HTN 2. Hyperlipidemia 3. CAD: s/p CABG with LIMA-LAD, SVG-ramus, SVG-OM3, SVG-PDA.  LHC (4/15) with patent grafts.  4. Ischemic cardiomyopathy: Echo (8/14) with EF 20-25% with regional WMAs, mild MR, mildly decreased RV systolic function.  EF LV-gram (4/15) was 15%. He has a [employer] officer, political party CRT-D device (upgraded 1/14).  RHC (4/15): mean RA 9, PA 61/24 (mean 38), mean PCWP 29, CI 1.9 Fick/2.2 thermo, PVR 2.7. CPX (04/2014): Peak VO2: 19.6 (76% predicted peak VO2), VE/VCO2: 34.8, Peak RER 1.23.  Echo (10/15) with EF 20-25% with regional wall motion abnormalities, normal RV size with mild to moderately decreased systolic function, mild AS, mild MR.  5. Syncopal episodes: Relatively frequent, etiology uncertain.  He does get lightheaded/near-syncopal with coughing spells, but he has syncope at other times also with no warning.  Since device has been in place, he has had VT detected but it has not been clearly correlated with syncopal episodes. Suspect vasomotor.  6. Atrial fibrillation: Paroxysmal.  Has not been  anticoagulated due to relatively frequent syncope/falls.  7. Gout 8. H/o LBBB 9. Type II diabetes 10. CKD  SH: Married, lives in [place], nonsmoker, rare ETOH.   FH: CAD  ROS: All systems reviewed and negative except as per HPI.   Current Outpatient Prescriptions  Medication Sig Dispense Refill  . acetaminophen (TYLENOL) 500 MG tablet Take 500 mg by mouth every 6 (six) hours as needed. For pain    . albuterol (PROVENTIL  HFA;VENTOLIN HFA) 108 (90 Base) MCG/ACT inhaler Inhale 2 puffs into the lungs every 4 (four) hours as needed for wheezing. 1 Inhaler 6  . allopurinol (ZYLOPRIM) 100 MG tablet Take 1 tablet (100 mg total) by mouth daily. 30 tablet 6  . calcium carbonate (OS-CAL) 600 MG TABS Take 600 mg by mouth daily.      . carvedilol (COREG) 3.125 MG tablet TAKE 1 TABLET BY MOUTH TWICE DAILY WITH MEALS 60 tablet 3  . colchicine 0.6 MG tablet Take 0.6 mg by mouth daily as needed (gouty flare).    Marland Kitchen digoxin (LANOXIN) 0.125 MG tablet Take 0.0625 mg by mouth daily.    Marland Kitchen ELIQUIS 5 MG TABS tablet TAKE 1 TABLET BY MOUTH TWICE A DAY 60 tablet 3  . famotidine (PEPCID) 10 MG tablet Take 10 mg by mouth daily.     Marland Kitchen FLUoxetine (PROZAC) 20 MG capsule Take 1 capsule (20 mg total) by mouth daily. 90 capsule 1  . furosemide (LASIX) 80 MG tablet Take 1 tablet (80 mg total) by mouth 2 (two) times daily. 60 tablet 6  . insulin glargine (LANTUS) 100 UNIT/ML injection Inject 45 Units into the skin at bedtime. Sliding scale    . loratadine (CLARITIN) 10 MG tablet Take 10 mg by mouth daily as needed. For allergies    . Multiple Vitamin (MULTIVITAMIN) tablet Take 1 tablet by mouth daily.     Marland Kitchen omega-3 acid ethyl esters (LOVAZA) 1 G capsule Take 2 g by mouth 2 (two) times daily.      . potassium chloride SA (K-DUR,KLOR-CON) 20 MEQ tablet Take 20 mEq by mouth daily.    . ramipril (ALTACE) 2.5 MG capsule TAKE 1 CAPSULE BY MOUTH ONCE A DAY 90 capsule 0  . spironolactone (ALDACTONE) 25 MG tablet Take 25 mg by mouth daily.      . tamsulosin (FLOMAX) 0.4 MG CAPS capsule Take 0.4 mg by mouth daily after supper.    . metolazone (ZAROXOLYN) 2.5 MG tablet Take 2.5 mg by mouth as needed (for weight gain). Reported on 05/12/2016     No current facility-administered medications for this encounter.    BP 106/62 mmHg  Pulse 62  Wt 169 lb 8 oz (76.885 kg)  SpO2 96% General: NAD wife present . Ambulated in the clinic without difficulty.  Neck: JVP  flat, no thyromegaly or thyroid nodule.  Lungs: Clear to auscultation bilaterally with normal respiratory effort. CV: Nondisplaced PMI.  Heart regular S1/S2, no S3/S4, no murmur.  No peripheral edema.  No carotid bruit.  Normal pedal pulses.  Abdomen: Soft, nontender, no hepatosplenomegaly, no distention.  Skin: Intact without lesions or rashes.  Neurologic: Alert and oriented x 3.  Psych: Normal affect. Extremities: No clubbing or cyanosis.   Assessment/Plan: 1, Recurrent syncope  --Spontaneuosly resolved without intervention. 2. Chronic systolic CHF: Ischemic cardiomyopathy s/p St Jude CRT-D, EF 20%(04/2016 )  NYHA II. Volume status stable and verified on Corvue. - Will continue lasix 80 mg BID and metolazone as needed for weight 174 lbs or  greater.  - Continue current ramipril, spironolactone, digoxin and Coreg.  - Reinforced the need and importance of daily weights, a low sodium diet, and fluid restriction (less than 2 L a day). Instructed to call the HF clinic if weight increases more than 3 lbs overnight or 5 lbs in a week.  3. CAD: Patent grafts on LHC in 4/15.  No chest pain. Continue statin.  He is on Eliquis so no longer taking aspirin.   4. Paroxysmal atrial fibrillation:Corvue ok.  Continue eliquis 5 mg twice a day. No recent falls.    Follow up in 3 months. Plan to check BMET at that time.   Jasan Doughtie NP-C   05/12/2016

## 2016-05-14 ENCOUNTER — Encounter: Payer: Self-pay | Admitting: Cardiology

## 2016-05-15 LAB — CUP PACEART REMOTE DEVICE CHECK
Battery Remaining Longevity: 12 mo
Battery Remaining Percentage: 19 %
Battery Voltage: 2.77 V
Brady Statistic RA Percent Paced: 88 %
HIGH POWER IMPEDANCE MEASURED VALUE: 46 Ohm
Implantable Lead Implant Date: 20020513
Implantable Lead Location: 753860
Implantable Lead Serial Number: 117908
Lead Channel Impedance Value: 480 Ohm
Lead Channel Impedance Value: 480 Ohm
Lead Channel Pacing Threshold Amplitude: 1.125 V
Lead Channel Pacing Threshold Pulse Width: 0.5 ms
Lead Channel Sensing Intrinsic Amplitude: 4 mV
Lead Channel Setting Pacing Amplitude: 2 V
Lead Channel Setting Pacing Amplitude: 3 V
Lead Channel Setting Pacing Pulse Width: 0.8 ms
Lead Channel Setting Sensing Sensitivity: 0.5 mV
MDC IDC LEAD IMPLANT DT: 20020513
MDC IDC LEAD IMPLANT DT: 20140109
MDC IDC LEAD LOCATION: 753858
MDC IDC LEAD LOCATION: 753859
MDC IDC MSMT LEADCHNL RV IMPEDANCE VALUE: 590 Ohm
MDC IDC MSMT LEADCHNL RV SENSING INTR AMPL: 11.8 mV
MDC IDC PG SERIAL: 733092
MDC IDC SESS DTM: 20170517113730
MDC IDC SET LEADCHNL LV PACING AMPLITUDE: 2.125
MDC IDC SET LEADCHNL LV PACING PULSEWIDTH: 0.5 ms
MDC IDC STAT BRADY AP VP PERCENT: 89 %
MDC IDC STAT BRADY AP VS PERCENT: 1 %
MDC IDC STAT BRADY AS VP PERCENT: 9.5 %
MDC IDC STAT BRADY AS VS PERCENT: 1 %

## 2016-05-28 ENCOUNTER — Ambulatory Visit (INDEPENDENT_AMBULATORY_CARE_PROVIDER_SITE_OTHER): Payer: Medicare Other

## 2016-05-28 DIAGNOSIS — Z95810 Presence of automatic (implantable) cardiac defibrillator: Secondary | ICD-10-CM | POA: Diagnosis not present

## 2016-05-28 DIAGNOSIS — I5022 Chronic systolic (congestive) heart failure: Secondary | ICD-10-CM

## 2016-05-28 NOTE — Progress Notes (Signed)
EPIC Encounter for ICM Monitoring  Patient Name: Mason Arnold is a 75 y.o. male Date: 05/28/2016 Primary Care Physican: Binnie Rail, MD Primary Cardiologist: [place] Electrophysiologist: Faustino Congress Weight: 167 lbs   Bi-V Pacing 98%      In the past month, have you:  1. Gained more than 2 pounds in a day or more than 5 pounds in a week? Yes, had gained a few pounds since Sunday but today weight is starting to return to baseline  2. Had changes in your medications (with verification of current medications)? Yes, Dr Haroldine Laws ordered Metolazone 2.5 mg prn for weight > 174 lbs  3. Had more shortness of breath than is usual for you? Improving today but has some increased SOB since 05/25/2016  4. Limited your activity because of shortness of breath? No but made it more difficulty to do activities  5. Not been able to sleep because of shortness of breath? No   6. Had increased swelling in your feet, ankles, legs or stomach area?  Bloating of stomach  7. Had symptoms of dehydration (dizziness, dry mouth, increased thirst, decreased urine output) No   8. Had changes in sodium restriction? No   9. Been compliant with medication? Yes   ICM trend: 3 month view for 05/28/2016   ICM trend: 1 year view for 05/28/2016   Follow-up plan: ICM clinic phone appointment 07/01/2016.    FLUID LEVELS: Corvue thoracic impedance decreased 05/25/2016 to 05/28/2016 suggesting fluid accumulation and starting to trend back toward baseline 05/27/2016 suggesting fluid levels are starting to improve.    SYMPTOMS:  Stomach bloating and some SOB with activities. He stated he started feeling better yesterday and symptoms are improving.  He has not needed to take any extra fluid pills. Encouraged to call if fluid symptoms return or worsen.  He was ordered Metolazone to help manage fluid symptoms.    EDUCATION:  Reminded to limit sodium intake to < 2000 mg and fluid intake to 64 oz daily.     RECOMMENDATIONS: No  changes today.    Advised will send to PCP, Dr. Haroldine Laws and Dr. Caryl Comes for review regarding symptoms and decreased thoracic impedance. Symptoms are improving today and impedance trending back toward baseline.  If any recommendations, will call back.    Rosalene Billings, RN, CCM 05/28/2016 3:46 PM

## 2016-05-30 ENCOUNTER — Encounter: Payer: Self-pay | Admitting: Cardiology

## 2016-06-13 ENCOUNTER — Other Ambulatory Visit (HOSPITAL_COMMUNITY): Payer: Self-pay | Admitting: Adult Health

## 2016-06-27 ENCOUNTER — Telehealth: Payer: Self-pay | Admitting: Cardiology

## 2016-06-27 NOTE — Telephone Encounter (Signed)
Spoke w/ pt and requested that he send a manual transmission b/c his home monitor has not updated in at least 8 days.   

## 2016-07-01 ENCOUNTER — Ambulatory Visit (INDEPENDENT_AMBULATORY_CARE_PROVIDER_SITE_OTHER): Payer: Medicare Other

## 2016-07-01 DIAGNOSIS — Z95810 Presence of automatic (implantable) cardiac defibrillator: Secondary | ICD-10-CM

## 2016-07-01 DIAGNOSIS — I5022 Chronic systolic (congestive) heart failure: Secondary | ICD-10-CM | POA: Diagnosis not present

## 2016-07-01 NOTE — Progress Notes (Signed)
EPIC Encounter for ICM Monitoring  Patient Name: Mason Arnold is a 75 y.o. male Date: 07/01/2016 Primary Care Physican: Binnie Rail, MD Primary Cardiologist: [place] Electrophysiologist: Faustino Congress Weight: 165 lb  Bi-V Pacing:  98%       Heart Failure questions reviewed, pt asymptomatic now.  He did report stomach bloating during decreased impedance.  Symptom resolved with taking extra Furosemide.  Did not need any Metolazone.   Thoracic impedance decreased suggesting fluid accumulation 06/11/2016 to 06/21/2016 and returned to normal 06/22/2016.  Recommendations: No changes.     ICM trend: 07/01/2016    Follow-up plan: ICM clinic phone appointment on 08/04/2016.  Copy of ICM check sent to device physician.   Rosalene Billings, RN 07/01/2016 3:15 PM

## 2016-07-03 ENCOUNTER — Other Ambulatory Visit (HOSPITAL_COMMUNITY): Payer: Self-pay | Admitting: Student

## 2016-07-03 ENCOUNTER — Other Ambulatory Visit: Payer: Self-pay | Admitting: Internal Medicine

## 2016-07-03 DIAGNOSIS — I480 Paroxysmal atrial fibrillation: Secondary | ICD-10-CM

## 2016-07-14 ENCOUNTER — Telehealth: Payer: Self-pay | Admitting: Cardiology

## 2016-07-14 NOTE — Telephone Encounter (Signed)
Spoke w/ pt and requested that he send a manual transmission b/c his home monitor has not updated in at least 8 days.   

## 2016-07-31 ENCOUNTER — Telehealth: Payer: Self-pay

## 2016-07-31 NOTE — Telephone Encounter (Signed)
Pre visit review using our clinic review tool, if applicable. No additional management support is needed unless otherwise documented below in the visit note. 

## 2016-08-04 ENCOUNTER — Ambulatory Visit (INDEPENDENT_AMBULATORY_CARE_PROVIDER_SITE_OTHER): Payer: Medicare Other | Admitting: *Deleted

## 2016-08-04 DIAGNOSIS — Z95810 Presence of automatic (implantable) cardiac defibrillator: Secondary | ICD-10-CM | POA: Diagnosis not present

## 2016-08-04 DIAGNOSIS — I5022 Chronic systolic (congestive) heart failure: Secondary | ICD-10-CM | POA: Diagnosis not present

## 2016-08-04 DIAGNOSIS — I255 Ischemic cardiomyopathy: Secondary | ICD-10-CM

## 2016-08-04 NOTE — Progress Notes (Signed)
EPIC Encounter for ICM Monitoring  Patient Name: Mason Arnold is a 75 y.o. male Date: 08/04/2016 Primary Care Physican: Binnie Rail, MD Primary Cardiologist: [place] Electrophysiologist: Faustino Congress Weight: 167 lb  Bi-V Pacing:  98%       Heart Failure questions reviewed, pt symptomatic with increase in shortness of breath and 5 lb weight gain  Thoracic impedance abnormal with 07/28/2016 to 07/31/2016 suggesting fluid accumulation.  Patient reported he took extra Lasix 07/28/2016 to 07/31/2016 and took Metolazone 07/30/2016 and impedance returned back to normal and symptoms resolved.  Recommendations: No changes.  Patient is managing fluid symptoms with extra Lasix and Metolazone when needed.     Follow-up plan: ICM clinic phone appointment on 09/16/2016.  Office appointment with Dr Haroldine Laws on 08/13/2016.  Copy of ICM check sent to primary cardiologist and device physician.   ICM trend: 08/04/2016       Rosalene Billings, RN 08/04/2016 3:50 PM

## 2016-08-05 NOTE — Progress Notes (Signed)
Remote ICD transmission.   

## 2016-08-07 ENCOUNTER — Encounter: Payer: Self-pay | Admitting: Cardiology

## 2016-08-13 ENCOUNTER — Encounter (HOSPITAL_COMMUNITY): Payer: Self-pay | Admitting: Internal Medicine

## 2016-08-13 ENCOUNTER — Ambulatory Visit (HOSPITAL_COMMUNITY)
Admission: RE | Admit: 2016-08-13 | Discharge: 2016-08-13 | Disposition: A | Discharge status: Home / Self Care | Payer: Medicare Other | Source: Ambulatory Visit | Attending: Internal Medicine | Admitting: Internal Medicine

## 2016-08-13 VITALS — BP 112/68 | HR 65 | Wt 172.5 lb

## 2016-08-13 DIAGNOSIS — N189 Chronic kidney disease, unspecified: Secondary | ICD-10-CM | POA: Diagnosis not present

## 2016-08-13 DIAGNOSIS — Z794 Long term (current) use of insulin: Secondary | ICD-10-CM | POA: Insufficient documentation

## 2016-08-13 DIAGNOSIS — I255 Ischemic cardiomyopathy: Secondary | ICD-10-CM | POA: Diagnosis not present

## 2016-08-13 DIAGNOSIS — Z7901 Long term (current) use of anticoagulants: Secondary | ICD-10-CM | POA: Diagnosis not present

## 2016-08-13 DIAGNOSIS — Z8249 Family history of ischemic heart disease and other diseases of the circulatory system: Secondary | ICD-10-CM | POA: Insufficient documentation

## 2016-08-13 DIAGNOSIS — R55 Syncope and collapse: Secondary | ICD-10-CM | POA: Diagnosis not present

## 2016-08-13 DIAGNOSIS — Z95810 Presence of automatic (implantable) cardiac defibrillator: Secondary | ICD-10-CM | POA: Insufficient documentation

## 2016-08-13 DIAGNOSIS — Z951 Presence of aortocoronary bypass graft: Secondary | ICD-10-CM | POA: Diagnosis not present

## 2016-08-13 DIAGNOSIS — I5022 Chronic systolic (congestive) heart failure: Secondary | ICD-10-CM | POA: Diagnosis not present

## 2016-08-13 DIAGNOSIS — E785 Hyperlipidemia, unspecified: Secondary | ICD-10-CM | POA: Diagnosis not present

## 2016-08-13 DIAGNOSIS — I4891 Unspecified atrial fibrillation: Secondary | ICD-10-CM

## 2016-08-13 DIAGNOSIS — I447 Left bundle-branch block, unspecified: Secondary | ICD-10-CM | POA: Diagnosis not present

## 2016-08-13 DIAGNOSIS — I130 Hypertensive heart and chronic kidney disease with heart failure and stage 1 through stage 4 chronic kidney disease, or unspecified chronic kidney disease: Secondary | ICD-10-CM | POA: Insufficient documentation

## 2016-08-13 DIAGNOSIS — I480 Paroxysmal atrial fibrillation: Secondary | ICD-10-CM | POA: Insufficient documentation

## 2016-08-13 DIAGNOSIS — E1122 Type 2 diabetes mellitus with diabetic chronic kidney disease: Secondary | ICD-10-CM | POA: Insufficient documentation

## 2016-08-13 DIAGNOSIS — M109 Gout, unspecified: Secondary | ICD-10-CM | POA: Insufficient documentation

## 2016-08-13 DIAGNOSIS — I2510 Atherosclerotic heart disease of native coronary artery without angina pectoris: Secondary | ICD-10-CM | POA: Insufficient documentation

## 2016-08-13 LAB — BASIC METABOLIC PANEL
ANION GAP: 8 (ref 5–15)
BUN: 21 mg/dL — ABNORMAL HIGH (ref 6–20)
CHLORIDE: 101 mmol/L (ref 101–111)
CO2: 26 mmol/L (ref 22–32)
Calcium: 8.9 mg/dL (ref 8.9–10.3)
Creatinine, Ser: 1.36 mg/dL — ABNORMAL HIGH (ref 0.61–1.24)
GFR calc non Af Amer: 49 mL/min — ABNORMAL LOW (ref >60)
GFR, EST AFRICAN AMERICAN: 57 mL/min — AB (ref >60)
Glucose, Bld: 189 mg/dL — ABNORMAL HIGH (ref 65–99)
POTASSIUM: 4.1 mmol/L (ref 3.5–5.1)
Sodium: 135 mmol/L (ref 135–145)

## 2016-08-13 NOTE — Progress Notes (Signed)
Patient ID: Mason Arnold, male   DOB: [birth year], 75 y.o.   MRN: [id number]   ADVANCED HF CLINIC NOTE PCP: Dr. Linna Darner Primary cardiologist: Dr. Stanford Breed EP: Dr Caryl Comes  Mason Arnold is a 75  yo with history of CAD s/p CABG 2000, ischemic cardiomyopathy (20%) with St Jude CRT-D, DM2, paroxysmal atrial fibrillation, and recurrent syncope of uncertain etiology.    He had RHC/LHC in 4/15 showing patent grafts but elevated filling pressures and low cardiac index (1.9 Fick/2.2 thermo).   Follow up for Heart Failure: He returns for HF follow up with his wife. Had syncopal episode on June 29th after coughing and choking on coffee. Says he was unconscious for 10 seconds. He did not seek medical attention. Taking metolazone about once a week and extra 40mg  extra lasix in am about 2x/week Weight at home 163-168 pounds. Denies SOB/PND/Orthopnea/CP.  [facility] He remains active in church. Taking all medications.   ECHO 04/16/2016: EF 22% IVC normal.  Echo 8/15: EF 15-20% RV moderate dysfunction.   Mason Arnold 04/2016 RA = 7 RV = 48/2/8 PA = 54/20 (33) PCW = 19 Fick cardiac output/index = 4.2/2.4 PVR = 3.3 WU Ao sat = 93% PA sat = 57%, 60%  Last cath 4/15 RA = 9 RV = 69/0/13 PA = 61/24 (38) PCW = 29 Fick cardiac output/index = 3.4/1.9 Thermo CO/CI = 4.1 /2.2 PVR = 2.7 Woods FA sat = 94% PA sat = 57%, 57% Ao Pressure: 104/58 (77) LV Pressure: 103/13/29  Left main: Mild plaque LAD: Totally occluded ostially LCX: Small ramus with high-grade ostial disease. AV groove LCX appears occluded in mid section. OM-1 occluded proximally. RCA: Unable to be cannulated. Suspect occluded ostially.  LV-gram done in the RAO projection: Ejection fraction = 15%. Basal inferior and anterior walls only sections that contract.  LIMA to LAD: Patent SVG to Ramus: Patent SVG to OM-3: Patent SVG to R PDA: Patent. The RCA is occluded prior to take-off of PDA. There are collaterals from distal RCA to OM-1  and probably a septal perforator.  CPX 04/24/14: FVC 2.70 (78%)  FEV1 2.15 (86%)  FEV1/FVC 80%  MVV 84 (80%) Resting HR: 65 Peak HR: 134 (92% age predicted max HR) Peak VO2: 19.6 (76.1% predicted peak VO2) VE/VCO2 slope: 34.8 OUES: 1.58 Peak RER: 1.23 Ventilatory Threshold: 15.0 (58.3% predicted peak VO2) Peak RR 46 Peak Ventilation: 68.1 VE/MVV: 81% PETCO2 at peak: 30 O2pulse: 11 (76% predicted O2pulse)  Labs (4/15): K 4.7, creatinine 1.4 Labs (5/15): K 3.7 Cr 1.56 pBNP 1,353 Labs (8/15): K 4.8, creatinine 1.6, pBNP 724 Labs (11/13/2014): K 3.6 Creatinine 1.79 dig level 0.5   PMH: 1. HTN 2. Hyperlipidemia 3. CAD: s/p CABG with LIMA-LAD, SVG-ramus, SVG-OM3, SVG-PDA.  LHC (4/15) with patent grafts.  4. Ischemic cardiomyopathy: Echo (8/14) with EF 20-25% with regional WMAs, mild MR, mildly decreased RV systolic function.  EF LV-gram (4/15) was 15%. He has a [employer] officer, political party CRT-D device (upgraded 1/14).  RHC (4/15): mean RA 9, PA 61/24 (mean 38), mean PCWP 29, CI 1.9 Fick/2.2 thermo, PVR 2.7. CPX (04/2014): Peak VO2: 19.6 (76% predicted peak VO2), VE/VCO2: 34.8, Peak RER 1.23.  Echo (10/15) with EF 20-25% with regional wall motion abnormalities, normal RV size with mild to moderately decreased systolic function, mild AS, mild MR.  5. Syncopal episodes: Relatively frequent, etiology uncertain.  He does get lightheaded/near-syncopal with coughing spells, but he has syncope at other times also with no warning.  Since device has  been in place, he has had VT detected but it has not been clearly correlated with syncopal episodes. Suspect vasomotor.  6. Atrial fibrillation: Paroxysmal.  Has not been anticoagulated due to relatively frequent syncope/falls.  7. Gout 8. H/o LBBB 9. Type II diabetes 10. CKD  SH: Married, lives in [place], nonsmoker, rare ETOH.   FH: CAD  ROS: All systems reviewed and negative except as per HPI.   Current Outpatient Prescriptions  Medication Sig Dispense Refill    . acetaminophen (TYLENOL) 500 MG tablet Take 500 mg by mouth every 6 (six) hours as needed. For pain    . albuterol (PROVENTIL HFA;VENTOLIN HFA) 108 (90 Base) MCG/ACT inhaler Inhale 2 puffs into the lungs every 4 (four) hours as needed for wheezing. 1 Inhaler 6  . allopurinol (ZYLOPRIM) 100 MG tablet Take 1 tablet (100 mg total) by mouth daily. 30 tablet 6  . calcium carbonate (OS-CAL) 600 MG TABS Take 600 mg by mouth daily.      . carvedilol (COREG) 3.125 MG tablet TAKE 1 TABLET BY MOUTH TWICE DAILY WITH MEALS 60 tablet 3  . digoxin (LANOXIN) 0.125 MG tablet Take 0.0625 mg by mouth daily.    Marland Kitchen ELIQUIS 5 MG TABS tablet TAKE 1 TABLET BY MOUTH TWICE A DAY 60 tablet 3  . famotidine (PEPCID) 10 MG tablet Take 10 mg by mouth daily.     Marland Kitchen FLUoxetine (PROZAC) 20 MG capsule Take 1 capsule (20 mg total) by mouth daily. 90 capsule 1  . furosemide (LASIX) 80 MG tablet Take 1 tablet (80 mg total) by mouth 2 (two) times daily. 60 tablet 6  . insulin glargine (LANTUS) 100 UNIT/ML injection Inject 45 Units into the skin at bedtime. Sliding scale    . loratadine (CLARITIN) 10 MG tablet Take 10 mg by mouth daily as needed. For allergies    . Multiple Vitamin (MULTIVITAMIN) tablet Take 1 tablet by mouth daily.     Marland Kitchen omega-3 acid ethyl esters (LOVAZA) 1 G capsule Take 2 g by mouth 2 (two) times daily.      . potassium chloride SA (K-DUR,KLOR-CON) 20 MEQ tablet Take 20 mEq by mouth daily.    . ramipril (ALTACE) 2.5 MG capsule TAKE 1 CAPSULE BY MOUTH ONCE A DAY 90 capsule 0  . spironolactone (ALDACTONE) 25 MG tablet Take 25 mg by mouth daily.      . tamsulosin (FLOMAX) 0.4 MG CAPS capsule Take 0.4 mg by mouth daily after supper.    . colchicine 0.6 MG tablet Take 0.6 mg by mouth daily as needed (gouty flare).    . metolazone (ZAROXOLYN) 2.5 MG tablet Take 2.5 mg by mouth as needed (for weight gain). Reported on 05/12/2016     No current facility-administered medications for this encounter.     BP 112/68   Pulse  65   Wt 172 lb 8 oz (78.2 kg)   SpO2 97%   BMI 28.71 kg/m  General: NAD wife present . Ambulated in the clinic without difficulty.  Neck: JVP flat, no thyromegaly or thyroid nodule.  Lungs: Clear to auscultation bilaterally with normal respiratory effort. CV: Nondisplaced PMI.  Heart regular S1/S2, no S3/S4, no murmur.  No peripheral edema.  No carotid bruit.  Normal pedal pulses.  Abdomen: Soft, nontender, no hepatosplenomegaly, no distention.  Skin: Intact without lesions or rashes.  Neurologic: Alert and oriented x 3.  Psych: Normal affect. Extremities: No clubbing or cyanosis.   Assessment/Plan: 1, Recurrent syncope  --Suspect cough syncope. No  events on ICD> 2. Chronic systolic CHF: Ischemic cardiomyopathy s/p St Jude CRT-D, EF 20%(04/2016 )  NYHA II. Volume status stable and verified on Corvue. - Will continue lasix 80 mg BID and metolazone as needed for weight 174 lbs or greater.  - Continue current ramipril, spironolactone, digoxin and Coreg. Dig level 03/2016 0.2 .  - Reinforced the need and importance of daily weights, a low sodium diet, and fluid restriction (less than 2 L a day). Instructed to call the HF clinic if weight increases more than 3 lbs overnight or 5 lbs in a week.  3. CAD: Patent grafts on LHC in 4/15.  No chest pain. Continue statin.  He is on Eliquis so no longer taking aspirin.   4. Paroxysmal atrial fibrillation: No recurrent AF on ICD interrogation.  Continue eliquis 5 mg twice a day. No recent falls.    Follow up in 3 months. Plan to check BMET at that time.   Amy Clegg NP-C   08/13/2016  Patient seen and examined with Darrick Grinder, NP. We discussed all aspects of the encounter. I agree with the assessment and plan as stated above.   Overall looks great. NYHA II-III. Volume status look very good today on exam and by Core-vue. However he does go up and down frequently and requires extra diuretics. He is managing sliding scale very well but I hate to see him go  up and down so much. Will try to limit fluids a bit as possible.   Had syncopal episode which sounds like cough syncope. No events on ICD.  ICD battery with 9.5 months left. Has f/u with Dr. Caryl Comes soon.   He needs to go back to the [facility]!  Treysean Petruzzi,MD 2:42 PM

## 2016-08-13 NOTE — Patient Instructions (Signed)
Lab today  We will contact you in 4 months to schedule your next appointment.  

## 2016-08-13 NOTE — Addendum Note (Signed)
Encounter addended by: Scarlette Calico, RN on: 08/13/2016  2:46 PM<BR>    Actions taken: Order Entry activity accessed, Diagnosis association updated, Sign clinical note

## 2016-08-14 LAB — CUP PACEART REMOTE DEVICE CHECK
Battery Remaining Percentage: 14 %
Date Time Interrogation Session: 20170907113409
HIGH POWER IMPEDANCE MEASURED VALUE: 50 Ohm
Implantable Lead Implant Date: 20020513
Implantable Lead Location: 753860
Implantable Lead Model: 5076
Lead Channel Impedance Value: 540 Ohm
Lead Channel Impedance Value: 610 Ohm
Lead Channel Pacing Threshold Amplitude: 1.125 V
Lead Channel Pacing Threshold Pulse Width: 0.5 ms
Lead Channel Setting Pacing Amplitude: 3 V
Lead Channel Setting Pacing Pulse Width: 0.5 ms
Lead Channel Setting Pacing Pulse Width: 0.8 ms
Lead Channel Setting Sensing Sensitivity: 0.5 mV
MDC IDC LEAD IMPLANT DT: 20020513
MDC IDC LEAD IMPLANT DT: 20140109
MDC IDC LEAD LOCATION: 753858
MDC IDC LEAD LOCATION: 753859
MDC IDC LEAD SERIAL: 117908
MDC IDC MSMT BATTERY REMAINING LONGEVITY: 10 mo
MDC IDC MSMT LEADCHNL RA IMPEDANCE VALUE: 530 Ohm
MDC IDC MSMT LEADCHNL RA SENSING INTR AMPL: 4.2 mV
MDC IDC MSMT LEADCHNL RV SENSING INTR AMPL: 12 mV
MDC IDC PG SERIAL: 733092
MDC IDC SET LEADCHNL LV PACING AMPLITUDE: 2.125
MDC IDC SET LEADCHNL RA PACING AMPLITUDE: 2 V
MDC IDC STAT BRADY RA PERCENT PACED: 87 %
MDC IDC STAT BRADY RV PERCENT PACED: 98 %

## 2016-08-18 ENCOUNTER — Telehealth: Payer: Self-pay | Admitting: Cardiology

## 2016-08-18 NOTE — Telephone Encounter (Signed)
LMOVM requesting that pt send manual transmission b/c home monitor has not updated in at least 8 days.   

## 2016-08-19 ENCOUNTER — Encounter: Payer: Self-pay | Admitting: Internal Medicine

## 2016-08-22 ENCOUNTER — Other Ambulatory Visit (HOSPITAL_COMMUNITY): Payer: Self-pay | Admitting: *Deleted

## 2016-08-22 ENCOUNTER — Encounter: Payer: Self-pay | Admitting: Cardiology

## 2016-08-22 MED ORDER — METOLAZONE 2.5 MG PO TABS: 2.5000 mg | ORAL_TABLET | Freq: Every day | ORAL | 3 refills | Status: DC | PRN

## 2016-08-27 ENCOUNTER — Other Ambulatory Visit (HOSPITAL_COMMUNITY): Payer: Self-pay | Admitting: Student

## 2016-08-29 ENCOUNTER — Telehealth (HOSPITAL_COMMUNITY): Payer: Self-pay

## 2016-08-29 NOTE — Telephone Encounter (Signed)
Patient called from [place] clinic after a gout flare up and being prescribed ultram and prednisone, wanting to know if this is ok to take with current medications and diagnosis. Per Dr. Aundra Dubin, instructed patient ok to take.  Renee Pain, RN

## 2016-08-31 ENCOUNTER — Telehealth: Payer: Self-pay | Admitting: Student

## 2016-08-31 NOTE — Telephone Encounter (Signed)
  Patient called because he believes he has an acute flare of gout in his left foot. Had been seen at the [facility] and prescribed Ultram and Prednisone. Advised him to keep taking the medications he was prescribed.  Patient had Colchicine and Allopurinol on his medication list during his last cardiology visit but says he has not been taking these due to running out of the Rx. Advised him to call the pharmacy to see if he has any remaining refills. If not, he needs to contact the [facility].  Patient voiced understanding of this and was appreciative of the call.   Signed, Erma Heritage, PA-C 08/31/2016, 12:28 PM Pager: [phone]

## 2016-09-01 ENCOUNTER — Encounter: Payer: Self-pay | Admitting: Family

## 2016-09-01 ENCOUNTER — Ambulatory Visit (INDEPENDENT_AMBULATORY_CARE_PROVIDER_SITE_OTHER): Payer: Medicare Other | Admitting: Family

## 2016-09-01 VITALS — BP 102/68 | HR 61 | Temp 97.9°F | Ht 65.0 in | Wt 167.0 lb

## 2016-09-01 DIAGNOSIS — M10071 Idiopathic gout, right ankle and foot: Secondary | ICD-10-CM

## 2016-09-01 DIAGNOSIS — M109 Gout, unspecified: Secondary | ICD-10-CM

## 2016-09-01 MED ORDER — METHYLPREDNISOLONE ACETATE 80 MG/ML IJ SUSP
80.0000 mg | Freq: Once | INTRAMUSCULAR | Status: AC
  Administered 2016-09-01: 80 mg via INTRAMUSCULAR

## 2016-09-01 MED ORDER — SULFAMETHOXAZOLE-TRIMETHOPRIM 800-160 MG PO TABS: 1.0000 | ORAL_TABLET | Freq: Two times a day (BID) | ORAL | 0 refills | Status: DC

## 2016-09-01 MED ORDER — PREDNISONE 10 MG (21) PO TBPK: ORAL_TABLET | ORAL | 0 refills | Status: DC

## 2016-09-01 MED ORDER — ALLOPURINOL 100 MG PO TABS: 100.0000 mg | ORAL_TABLET | Freq: Every day | ORAL | 2 refills | Status: DC

## 2016-09-01 NOTE — Progress Notes (Signed)
Subjective:    Patient ID: Mason Arnold, male    DOB: 1941-06-13, 75 y.o.   MRN: [id number]  Chief Complaint  Patient presents with  . Foot Swelling    Rt foot swelling x 3 weeks. There is some streaking on the anterior of the lower right leg. Pt wife Ivin Booty) states that pt foot is hot to the touch. Pt has not felt like eating for the last three. Ivin Booty also mentions that pt had a fever of 99.1 this am. Tylenol was given.   . Immunizations    Will wait for approval to give flu vaccine.     HPI:  Mason Arnold is a 75 y.o. male who  has a past medical history of Anginal pain ([place]); Congestive heart failure ([place]); Diabetes mellitus type II; Gout; Gout; Heart murmur; Hyperlipidemia; Hypertension; ICD (implantable cardiac defibrillator) in place; Ischemic heart disease; Left bundle branch block; Myocardial infarction ([place]) (2000; 2002; 2004; ~ 2006); Pacemaker; Pneumonia; and Ventricular tachycardia ([place]). and presents today for an acute office visit.  This is a new problem. Associated symptom of pain and swelling located in his right foot that has been going on for about 3 weeks. Did have an elevated temperature this morning that was 99.6 which was also the highest it has been. Pain is severe rating it a 10/10 and described as sharp and any time that his leg is lowered. He is unable to place weight on it. Wife reports that he has had a red streak over the past couple of days. Modifying factors include ice and tylenol. Does have history of gout located in the same area. He recently ran out of the allopurinol for 2-3 weeks. He started taking colchicine and has noted a little improvement.    Allergies  Allergen Reactions  . Ampicillin Rash  . Crestor [Rosuvastatin Calcium] Rash and Other (See Comments)    Rash as nodules ("like strawberries")  . Orange Fruit [Citrus] Swelling and Other (See Comments)    Lips swelling, severe headache   . Propoxyphene N-Acetaminophen Other (See Comments)      hallucinations  [darvicet]      Outpatient Medications Prior to Visit  Medication Sig Dispense Refill  . acetaminophen (TYLENOL) 500 MG tablet Take 500 mg by mouth every 6 (six) hours as needed. For pain    . albuterol (PROVENTIL HFA;VENTOLIN HFA) 108 (90 Base) MCG/ACT inhaler Inhale 2 puffs into the lungs every 4 (four) hours as needed for wheezing. 1 Inhaler 6  . calcium carbonate (OS-CAL) 600 MG TABS Take 600 mg by mouth daily.      . carvedilol (COREG) 3.125 MG tablet TAKE 1 TABLET BY MOUTH TWICE DAILY WITH MEALS 60 tablet 3  . colchicine 0.6 MG tablet Take 0.6 mg by mouth daily as needed (gouty flare).    Marland Kitchen digoxin (LANOXIN) 0.125 MG tablet Take 0.0625 mg by mouth daily.    Marland Kitchen ELIQUIS 5 MG TABS tablet TAKE 1 TABLET BY MOUTH TWICE A DAY 60 tablet 3  . famotidine (PEPCID) 10 MG tablet Take 10 mg by mouth daily.     Marland Kitchen FLUoxetine (PROZAC) 20 MG capsule Take 1 capsule (20 mg total) by mouth daily. 90 capsule 1  . furosemide (LASIX) 80 MG tablet Take 1 tablet (80 mg total) by mouth 2 (two) times daily. 60 tablet 6  . insulin glargine (LANTUS) 100 UNIT/ML injection Inject 45 Units into the skin at bedtime. Sliding scale    . loratadine (CLARITIN) 10 MG tablet Take  10 mg by mouth daily as needed. For allergies    . metolazone (ZAROXOLYN) 2.5 MG tablet Take 1 tablet (2.5 mg total) by mouth daily as needed. For fluid or edema 30 tablet 3  . Multiple Vitamin (MULTIVITAMIN) tablet Take 1 tablet by mouth daily.     Marland Kitchen omega-3 acid ethyl esters (LOVAZA) 1 G capsule Take 2 g by mouth 2 (two) times daily.      . potassium chloride SA (K-DUR,KLOR-CON) 20 MEQ tablet Take 20 mEq by mouth daily.    . ramipril (ALTACE) 2.5 MG capsule TAKE 1 CAPSULE BY MOUTH ONCE DAILY 90 capsule 5  . spironolactone (ALDACTONE) 25 MG tablet Take 25 mg by mouth daily.      . tamsulosin (FLOMAX) 0.4 MG CAPS capsule Take 0.4 mg by mouth daily after supper.    Marland Kitchen allopurinol (ZYLOPRIM) 100 MG tablet Take 1 tablet (100 mg  total) by mouth daily. 30 tablet 6   No facility-administered medications prior to visit.     Review of Systems  Constitutional: Negative for chills and fever (Mildly elevated temp).  Respiratory: Negative for chest tightness and shortness of breath.   Cardiovascular: Positive for leg swelling. Negative for chest pain and palpitations.  Musculoskeletal:       Positive for right ankle edema and redness.       Objective:    BP 102/68 (BP Location: Right Arm, Patient Position: Sitting, Cuff Size: Normal)   Pulse 61   Temp 97.9 F (36.6 C) (Oral)   Ht 5\' 5"  (1.651 m)   Wt 167 lb (75.8 kg)   SpO2 96%   BMI 27.79 kg/m  Nursing note and vital signs reviewed.  Physical Exam  Constitutional: He is oriented to person, place, and time. He appears well-developed and well-nourished. No distress.  Cardiovascular: Normal rate, regular rhythm, normal heart sounds and intact distal pulses.   Pulmonary/Chest: Effort normal and breath sounds normal.  Musculoskeletal:  Right ankle -no obvious deformity with redness and edema of right ankle. There is generalized tenderness to palpation. Ankle range of motion is limited secondary to discomfort. Strength is limited secondary to discomfort. Pulses are intact and appropriate. Ligamentous testing deferred secondary to pain.  Neurological: He is alert and oriented to person, place, and time.  Skin: Skin is warm and dry.  Psychiatric: He has a normal mood and affect. His behavior is normal. Judgment and thought content normal.       Assessment & Plan:   Problem List Items Addressed This Visit      Other   GOUT    Symptoms and exam consistent with gout with mild concern for infection. In office injection of Depo-Medrol provided. Start prednisone Dosepak as needed. If symptoms do not improve instructions to start Bactrim provided. Advised to seek further care if fever develops or symptoms worsen in the next 24-48 hours. Restart allopurinol. Follow-up  if symptoms worsen or do not improve.      Relevant Medications   allopurinol (ZYLOPRIM) 100 MG tablet   predniSONE (STERAPRED UNI-PAK 21 TAB) 10 MG (21) TBPK tablet   methylPREDNISolone acetate (DEPO-MEDROL) injection 80 mg (Completed)    Other Visit Diagnoses    Acute gout of right foot, unspecified cause    -  Primary   Relevant Medications   allopurinol (ZYLOPRIM) 100 MG tablet   predniSONE (STERAPRED UNI-PAK 21 TAB) 10 MG (21) TBPK tablet   methylPREDNISolone acetate (DEPO-MEDROL) injection 80 mg (Completed)       I  am having Mr. Panik start on sulfamethoxazole-trimethoprim and predniSONE. I am also having him maintain his acetaminophen, calcium carbonate, loratadine, insulin glargine, omega-3 acid ethyl esters, multivitamin, famotidine, spironolactone, furosemide, colchicine, potassium chloride SA, tamsulosin, albuterol, digoxin, ELIQUIS, FLUoxetine, carvedilol, metolazone, ramipril, and allopurinol. We administered methylPREDNISolone acetate.   Meds ordered this encounter  Medications  . allopurinol (ZYLOPRIM) 100 MG tablet    Sig: Take 1 tablet (100 mg total) by mouth daily.    Dispense:  30 tablet    Refill:  2    Order Specific Question:   Supervising Provider    Answer:   Pricilla Holm A [[id number]]  . sulfamethoxazole-trimethoprim (BACTRIM DS,SEPTRA DS) 800-160 MG tablet    Sig: Take 1 tablet by mouth 2 (two) times daily.    Dispense:  14 tablet    Refill:  0    Order Specific Question:   Supervising Provider    Answer:   Pricilla Holm A [[id number]]  . predniSONE (STERAPRED UNI-PAK 21 TAB) 10 MG (21) TBPK tablet    Sig: Take 6 tablets x 1 day, 5 tablets x 1 day, 4 tablets x 1 day, 3 tablets x 1 day, 2 tablets x 1 day, 1 tablet x 1 day    Dispense:  21 tablet    Refill:  0    Order Specific Question:   Supervising Provider    Answer:   Pricilla Holm A [[id number]]  . methylPREDNISolone acetate (DEPO-MEDROL) injection 80 mg     Follow-up: Return if  symptoms worsen or fail to improve.  Mauricio Po, FNP

## 2016-09-01 NOTE — Patient Instructions (Signed)
Thank you for choosing [facility].  SUMMARY AND INSTRUCTIONS:  Medication:  Please restart taking the allopurinol.  Start the prednisone if needed.   If no improvement in 48 hours please start Bactrim.   Your prescription(s) have been submitted to your pharmacy or been printed and provided for you. Please take as directed and contact our office if you believe you are having problem(s) with the medication(s) or have any questions.  Follow up:  If your symptoms worsen or fail to improve, please contact our office for further instruction, or in case of emergency go directly to the emergency room at the closest medical facility.    Gout Gout is an inflammatory arthritis caused by a buildup of uric acid crystals in the joints. Uric acid is a chemical that is normally present in the blood. When the level of uric acid in the blood is too high it can form crystals that deposit in your joints and tissues. This causes joint redness, soreness, and swelling (inflammation). Repeat attacks are common. Over time, uric acid crystals can form into masses (tophi) near a joint, destroying bone and causing disfigurement. Gout is treatable and often preventable. CAUSES  The disease begins with elevated levels of uric acid in the blood. Uric acid is produced by your body when it breaks down a naturally found substance called purines. Certain foods you eat, such as meats and fish, contain high amounts of purines. Causes of an elevated uric acid level include:  Being passed down from parent to child (heredity).  Diseases that cause increased uric acid production (such as obesity, psoriasis, and certain cancers).  Excessive alcohol use.  Diet, especially diets rich in meat and seafood.  Medicines, including certain cancer-fighting medicines (chemotherapy), water pills (diuretics), and aspirin.  Chronic kidney disease. The kidneys are no longer able to remove uric acid well.  Problems with  metabolism. Conditions strongly associated with gout include:  Obesity.  High blood pressure.  High cholesterol.  Diabetes. Not everyone with elevated uric acid levels gets gout. It is not understood why some people get gout and others do not. Surgery, joint injury, and eating too much of certain foods are some of the factors that can lead to gout attacks. SYMPTOMS   An attack of gout comes on quickly. It causes intense pain with redness, swelling, and warmth in a joint.  Fever can occur.  Often, only one joint is involved. Certain joints are more commonly involved:  Base of the big toe.  Knee.  Ankle.  Wrist.  Finger. Without treatment, an attack usually goes away in a few days to weeks. Between attacks, you usually will not have symptoms, which is different from many other forms of arthritis. DIAGNOSIS  Your caregiver will suspect gout based on your symptoms and exam. In some cases, tests may be recommended. The tests may include:  Blood tests.  Urine tests.  X-rays.  Joint fluid exam. This exam requires a needle to remove fluid from the joint (arthrocentesis). Using a microscope, gout is confirmed when uric acid crystals are seen in the joint fluid. TREATMENT  There are two phases to gout treatment: treating the sudden onset (acute) attack and preventing attacks (prophylaxis).  Treatment of an Acute Attack.  Medicines are used. These include anti-inflammatory medicines or steroid medicines.  An injection of steroid medicine into the affected joint is sometimes necessary.  The painful joint is rested. Movement can worsen the arthritis.  You may use warm or cold treatments on painful joints,  depending which works best for you.  Treatment to Prevent Attacks.  If you suffer from frequent gout attacks, your caregiver may advise preventive medicine. These medicines are started after the acute attack subsides. These medicines either help your kidneys eliminate uric  acid from your body or decrease your uric acid production. You may need to stay on these medicines for a very long time.  The early phase of treatment with preventive medicine can be associated with an increase in acute gout attacks. For this reason, during the first few months of treatment, your caregiver may also advise you to take medicines usually used for acute gout treatment. Be sure you understand your caregiver's directions. Your caregiver may make several adjustments to your medicine dose before these medicines are effective.  Discuss dietary treatment with your caregiver or dietitian. Alcohol and drinks high in sugar and fructose and foods such as meat, poultry, and seafood can increase uric acid levels. Your caregiver or dietitian can advise you on drinks and foods that should be limited. HOME CARE INSTRUCTIONS   Do not take aspirin to relieve pain. This raises uric acid levels.  Only take over-the-counter or prescription medicines for pain, discomfort, or fever as directed by your caregiver.  Rest the joint as much as possible. When in bed, keep sheets and blankets off painful areas.  Keep the affected joint raised (elevated).  Apply warm or cold treatments to painful joints. Use of warm or cold treatments depends on which works best for you.  Use crutches if the painful joint is in your leg.  Drink enough fluids to keep your urine clear or pale yellow. This helps your body get rid of uric acid. Limit alcohol, sugary drinks, and fructose drinks.  Follow your dietary instructions. Pay careful attention to the amount of protein you eat. Your daily diet should emphasize fruits, vegetables, whole grains, and fat-free or low-fat milk products. Discuss the use of coffee, vitamin C, and cherries with your caregiver or dietitian. These may be helpful in lowering uric acid levels.  Maintain a healthy body weight. SEEK MEDICAL CARE IF:   You develop diarrhea, vomiting, or any side effects  from medicines.  You do not feel better in 24 hours, or you are getting worse. SEEK IMMEDIATE MEDICAL CARE IF:   Your joint becomes suddenly more tender, and you have chills or a fever. MAKE SURE YOU:   Understand these instructions.  Will watch your condition.  Will get help right away if you are not doing well or get worse.   This information is not intended to replace advice given to you by your health care provider. Make sure you discuss any questions you have with your health care provider.   Document Released: 11/21/2000 Document Revised: 12/15/2014 Document Reviewed: 07/07/2012 Elsevier Interactive Patient Education [employer].

## 2016-09-01 NOTE — Assessment & Plan Note (Signed)
Symptoms and exam consistent with gout with mild concern for infection. In office injection of Depo-Medrol provided. Start prednisone Dosepak as needed. If symptoms do not improve instructions to start Bactrim provided. Advised to seek further care if fever develops or symptoms worsen in the next 24-48 hours. Restart allopurinol. Follow-up if symptoms worsen or do not improve.

## 2016-09-01 NOTE — Progress Notes (Signed)
Pre visit review using our clinic review tool, if applicable. No additional management support is needed unless otherwise documented below in the visit note. 

## 2016-09-09 ENCOUNTER — Ambulatory Visit (INDEPENDENT_AMBULATORY_CARE_PROVIDER_SITE_OTHER): Payer: Medicare Other

## 2016-09-09 DIAGNOSIS — Z23 Encounter for immunization: Secondary | ICD-10-CM

## 2016-09-16 ENCOUNTER — Ambulatory Visit (INDEPENDENT_AMBULATORY_CARE_PROVIDER_SITE_OTHER): Payer: Medicare Other

## 2016-09-16 DIAGNOSIS — I5022 Chronic systolic (congestive) heart failure: Secondary | ICD-10-CM

## 2016-09-16 DIAGNOSIS — Z95810 Presence of automatic (implantable) cardiac defibrillator: Secondary | ICD-10-CM | POA: Diagnosis not present

## 2016-09-17 NOTE — Progress Notes (Signed)
EPIC Encounter for ICM Monitoring  Patient Name: Mason Arnold is a 75 y.o. male Date: 09/17/2016 Primary Care Physican: Binnie Rail, MD Primary Cardiologist:Bensimhon Electrophysiologist: Faustino Congress Weight:    163.5 lb  Bi-V Pacing:  98%          Heart Failure questions reviewed, pt asymptomatic   Thoracic impedance normal.  Discussed days of decreased impedance in September and he says it is caused by stress and there is nothing he can do about that.  He stated that is how his body reacts.  He reported he checks food labels for salt content and limits to 2000 mg daily.  Recommendations: No changes.    Encouraged to call for fluid symptoms  Follow-up plan: ICM clinic phone appointment on 11/25/2016 and office appointment with Dr Caryl Comes on 10/24/2016.  Copy of ICM check sent to physician.   ICM trend: 09/16/2016       Rosalene Billings, RN 09/17/2016 11:34 AM

## 2016-10-06 ENCOUNTER — Encounter: Payer: Self-pay | Admitting: Internal Medicine

## 2016-10-09 ENCOUNTER — Telehealth: Payer: Self-pay | Admitting: Cardiology

## 2016-10-09 NOTE — Telephone Encounter (Signed)
Spoke w/ pt and requested that he send a manual transmission b/c his home monitor has not updated in at least 14 days.   

## 2016-10-15 ENCOUNTER — Other Ambulatory Visit (HOSPITAL_COMMUNITY): Payer: Self-pay | Admitting: Internal Medicine

## 2016-10-22 ENCOUNTER — Telehealth: Payer: Self-pay | Admitting: Cardiology

## 2016-10-22 NOTE — Telephone Encounter (Signed)
Spoke w/ pt and requested that he send a manual transmission b/c his home monitor has not updated in at least 7 days.   

## 2016-10-24 ENCOUNTER — Encounter: Payer: Medicare Other | Admitting: Internal Medicine

## 2016-10-27 ENCOUNTER — Encounter: Payer: Self-pay | Admitting: Internal Medicine

## 2016-10-27 ENCOUNTER — Ambulatory Visit (INDEPENDENT_AMBULATORY_CARE_PROVIDER_SITE_OTHER): Payer: Medicare Other | Admitting: Internal Medicine

## 2016-10-27 VITALS — BP 100/70 | HR 64 | Ht 65.0 in | Wt 166.4 lb

## 2016-10-27 DIAGNOSIS — I5022 Chronic systolic (congestive) heart failure: Secondary | ICD-10-CM

## 2016-10-27 DIAGNOSIS — I480 Paroxysmal atrial fibrillation: Secondary | ICD-10-CM

## 2016-10-27 DIAGNOSIS — Z95810 Presence of automatic (implantable) cardiac defibrillator: Secondary | ICD-10-CM | POA: Diagnosis not present

## 2016-10-27 DIAGNOSIS — I472 Ventricular tachycardia, unspecified: Secondary | ICD-10-CM

## 2016-10-27 DIAGNOSIS — I255 Ischemic cardiomyopathy: Secondary | ICD-10-CM

## 2016-10-27 LAB — CUP PACEART INCLINIC DEVICE CHECK
Brady Statistic RA Percent Paced: 88 %
Brady Statistic RV Percent Paced: 98 %
Date Time Interrogation Session: 20171120160540
HIGH POWER IMPEDANCE MEASURED VALUE: 45.2654
Implantable Lead Implant Date: 20020513
Implantable Lead Implant Date: 20020513
Implantable Lead Implant Date: 20140109
Implantable Lead Location: 753859
Implantable Lead Location: 753860
Lead Channel Impedance Value: 475 Ohm
Lead Channel Impedance Value: 487.5 Ohm
Lead Channel Impedance Value: 550 Ohm
Lead Channel Pacing Threshold Amplitude: 0.75 V
Lead Channel Pacing Threshold Amplitude: 1 V
Lead Channel Pacing Threshold Pulse Width: 0.4 ms
Lead Channel Setting Pacing Amplitude: 2 V
Lead Channel Setting Pacing Pulse Width: 0.8 ms
Lead Channel Setting Sensing Sensitivity: 0.5 mV
MDC IDC LEAD LOCATION: 753858
MDC IDC LEAD SERIAL: 117908
MDC IDC MSMT LEADCHNL LV PACING THRESHOLD PULSEWIDTH: 0.5 ms
MDC IDC MSMT LEADCHNL RA SENSING INTR AMPL: 2.9 mV
MDC IDC MSMT LEADCHNL RV PACING THRESHOLD AMPLITUDE: 1 V
MDC IDC MSMT LEADCHNL RV PACING THRESHOLD PULSEWIDTH: 0.8 ms
MDC IDC MSMT LEADCHNL RV SENSING INTR AMPL: 12 mV
MDC IDC PG IMPLANT DT: 20101013
MDC IDC PG SERIAL: 733092
MDC IDC SET LEADCHNL LV PACING AMPLITUDE: 2 V
MDC IDC SET LEADCHNL LV PACING PULSEWIDTH: 0.5 ms
MDC IDC SET LEADCHNL RV PACING AMPLITUDE: 2.5 V

## 2016-10-27 NOTE — Patient Instructions (Signed)
Medication Instructions: - Your physician recommends that you continue on your current medications as directed. Please refer to the Current Medication list given to you today.  Labwork: - none ordered  Procedures/Testing: - none ordered  Follow-Up: - Remote monitoring is used to monitor your Pacemaker of ICD from home. This monitoring reduces the number of office visits required to check your device to one time per year. It allows [place] to keep an eye on the functioning of your device to ensure it is working properly. You are scheduled for a device check from home on 01/26/17. You may send your transmission at any time that day. If you have a wireless device, the transmission will be sent automatically. After your physician reviews your transmission, you will receive a postcard with your next transmission date.  - Your physician wants you to follow-up in: 1 year with Dr. Caryl Comes. You will receive a reminder letter in the mail two months in advance. If you don't receive a letter, please call our office to schedule the follow-up appointment.  Any Additional Special Instructions Will Be Listed Below (If Applicable).     If you need a refill on your cardiac medications before your next appointment, please call your pharmacy.

## 2016-10-27 NOTE — Progress Notes (Signed)
[facility] Patient Care Team: Binnie Rail, MD as PCP - General (Internal Medicine)   HPI  Mason Arnold is a 75 y.o. male he is seen in followup for ischemic cardiomyopathy status post coronary bypass graft, he is status post ICD implantation; underwent generator replacement in October 2010.  His last Myoview was performed in July of 2011. At that time, he had an ejection fraction of 28%. There was a previous infarct involving the anterior wall, apex, and inferior wall with minimal ischemia noted  He underwent catheterization 4/15 with patent grafts.  Echocardiogram in July of 2013 showed an ejection fraction of 20-25%. There was mild left ventricular enlargement, Moderate to severe left atrial enlargement and mildly reduced RV function   Echo 8/16 EF 15-20% RHC >>RA = 7 RV = 48/2/8 PA = 54/20 (33) PCW = 26 December 2012 he underwent CRT upgrade   10/17 Cr 1.58 K4.7 Hgb 15.3  (Labs reviewed from the [place]       He denies chest pain. he continues with shortness of breath with exertion. He has very vivid dreams        Past Medical History:  Diagnosis Date  . Anginal pain ([place])   . Congestive heart failure ([employer])   . Diabetes mellitus type II    IDDM , [facility]  . Gout   . Gout    roast beef is a trigger  . Heart murmur   . Hyperlipidemia   . Hypertension   . ICD (implantable cardiac defibrillator) in place   . Ischemic heart disease   . Left bundle branch block   . Myocardial infarction 2000; 2002; 2004; ~ 2006  . Pacemaker   . Pneumonia    "once, I think" (12/16/2012)  . Ventricular tachycardia [facility])     Past Surgical History:  Procedure Laterality Date  . A-V CARDIAC PACEMAKER INSERTION  2002; 2010  . BIV ICD GENERTAOR CHANGE OUT N/A 12/16/2012   Procedure: BIV ICD GENERTAOR CHANGE OUT;  Surgeon: Deboraha Sprang, MD;  Location: [facility] CATH LAB;  Service: Cardiovascular;  Laterality: N/A;  . CARDIAC CATHETERIZATION     "3 or 4; never had interventions" (12/16/2012)  . CARDIAC  CATHETERIZATION N/A 04/11/2016   Procedure: Right Heart Cath;  Surgeon: Jolaine Artist, MD;  Location: [facility] CV LAB;  Service: Cardiovascular;  Laterality: N/A;  . CORONARY ARTERY BYPASS GRAFT  2000   CABG X4  . INSERT / REPLACE / REMOVE PACEMAKER  12/16/2012   LV lead placement; ICD assessment   . LEFT AND RIGHT HEART CATHETERIZATION WITH CORONARY/GRAFT ANGIOGRAM N/A 04/04/2014   Procedure: LEFT AND RIGHT HEART CATHETERIZATION WITH Beatrix Fetters;  Surgeon: Jolaine Artist, MD;  Location: [facility] CATH LAB;  Service: Cardiovascular;  Laterality: N/A;  . [place]  . URACHAL CYST EXCISION  ~ 1995  . VASECTOMY  ?1967    Current Outpatient Prescriptions  Medication Sig Dispense Refill  . acetaminophen (TYLENOL) 500 MG tablet Take 500 mg by mouth every 6 (six) hours as needed. For pain    . albuterol (PROVENTIL HFA;VENTOLIN HFA) 108 (90 Base) MCG/ACT inhaler Inhale 2 puffs into the lungs every 4 (four) hours as needed for wheezing. 1 Inhaler 6  . allopurinol (ZYLOPRIM) 100 MG tablet Take 1 tablet (100 mg total) by mouth daily. 30 tablet 2  . calcium carbonate (OS-CAL) 600 MG TABS Take 600 mg by mouth daily.      . carvedilol (COREG) 3.125 MG tablet TAKE  1 TABLET BY MOUTH TWICE DAILY WITH MEALS 60 tablet 3  . colchicine 0.6 MG tablet Take 0.6 mg by mouth daily as needed (gouty flare).    Marland Kitchen digoxin (LANOXIN) 0.125 MG tablet Take 0.0625 mg by mouth daily.    Marland Kitchen ELIQUIS 5 MG TABS tablet TAKE 1 TABLET BY MOUTH TWICE A DAY 60 tablet 3  . famotidine (PEPCID) 10 MG tablet Take 10 mg by mouth daily.     Marland Kitchen FLUoxetine (PROZAC) 20 MG capsule Take 1 capsule (20 mg total) by mouth daily. 90 capsule 1  . furosemide (LASIX) 80 MG tablet Take 1 tablet (80 mg total) by mouth 2 (two) times daily. 60 tablet 6  . insulin glargine (LANTUS) 100 UNIT/ML injection Inject 45 Units into the skin at bedtime. Sliding scale    . loratadine (CLARITIN) 10 MG tablet Take 10 mg by mouth  daily as needed. For allergies    . metolazone (ZAROXOLYN) 2.5 MG tablet Take 1 tablet (2.5 mg total) by mouth daily as needed. For fluid or edema 30 tablet 3  . Multiple Vitamin (MULTIVITAMIN) tablet Take 1 tablet by mouth daily.     Marland Kitchen omega-3 acid ethyl esters (LOVAZA) 1 G capsule Take 2 g by mouth 2 (two) times daily.      . potassium chloride SA (K-DUR,KLOR-CON) 20 MEQ tablet Take 1 tablet (20 mEq total) by mouth daily. 90 tablet 3  . ramipril (ALTACE) 2.5 MG capsule TAKE 1 CAPSULE BY MOUTH ONCE DAILY 90 capsule 5  . spironolactone (ALDACTONE) 25 MG tablet Take 25 mg by mouth daily.      Marland Kitchen sulfamethoxazole-trimethoprim (BACTRIM DS,SEPTRA DS) 800-160 MG tablet Take 1 tablet by mouth 2 (two) times daily. 14 tablet 0  . tamsulosin (FLOMAX) 0.4 MG CAPS capsule Take 0.4 mg by mouth daily after supper.    . predniSONE (STERAPRED UNI-PAK 21 TAB) 10 MG (21) TBPK tablet Take 6 tablets x 1 day, 5 tablets x 1 day, 4 tablets x 1 day, 3 tablets x 1 day, 2 tablets x 1 day, 1 tablet x 1 day (Patient not taking: Reported on 10/27/2016) 21 tablet 0   No current facility-administered medications for this visit.     Allergies  Allergen Reactions  . Ampicillin Rash  . Crestor [Rosuvastatin Calcium] Rash and Other (See Comments)    Rash as nodules ("like strawberries")  . Orange Fruit [Citrus] Swelling and Other (See Comments)    Lips swelling, severe headache   . Propoxyphene N-Acetaminophen Other (See Comments)    hallucinations  [darvicet]    Review of Systems negative except from HPI and PMH  Physical Exam BP 100/70   Pulse 64   Ht 5\' 5"  (1.651 m)   Wt 166 lb 6.4 oz (75.5 kg)   SpO2 93%   BMI 27.69 kg/m  Well developed and nourished in no acute distress HENT normal Neck supple with JVP-flat Clear Regular rate and rhythm, no murmurs or gallops Abd-soft with active BS No Clubbing cyanosis edema Skin-warm and dry A & Oriented  Grossly normal sensory and motor function  ECG dated today  AV pacing at 69 with an up right QRS in V1 and negative in lead 1 consistent with biventricular pacing   Assessment and  Plan  Ischemic cardiomyopathy  Congestive heart failure  Implantable defibrillator-St. Jude-CRT The patient's device was interrogated.  The information was reviewed. No changes were made in the programming.     Atrial fibrillation  Ventricular tachycardia  Renal insufficiency  grade 3    Without symptoms of ischemia  Euvolemic continue current meds  No intercurrent Ventricular tachycardia   His device is approaching ERI.  No intercurrent atrial fibrillation or flutter

## 2016-11-05 ENCOUNTER — Other Ambulatory Visit (HOSPITAL_COMMUNITY): Payer: Self-pay | Admitting: Student

## 2016-11-06 ENCOUNTER — Telehealth: Payer: Self-pay | Admitting: Cardiology

## 2016-11-06 NOTE — Telephone Encounter (Signed)
Spoke w/ pt and requested that he send a manual transmission b/c his home monitor has not updated in at least 7 days.   

## 2016-11-18 ENCOUNTER — Ambulatory Visit (INDEPENDENT_AMBULATORY_CARE_PROVIDER_SITE_OTHER): Payer: Medicare Other | Admitting: Internal Medicine

## 2016-11-18 ENCOUNTER — Encounter: Payer: Self-pay | Admitting: Internal Medicine

## 2016-11-18 DIAGNOSIS — E119 Type 2 diabetes mellitus without complications: Secondary | ICD-10-CM

## 2016-11-18 DIAGNOSIS — F329 Major depressive disorder, single episode, unspecified: Secondary | ICD-10-CM

## 2016-11-18 DIAGNOSIS — I480 Paroxysmal atrial fibrillation: Secondary | ICD-10-CM | POA: Diagnosis not present

## 2016-11-18 DIAGNOSIS — I10 Essential (primary) hypertension: Secondary | ICD-10-CM | POA: Diagnosis not present

## 2016-11-18 DIAGNOSIS — I5022 Chronic systolic (congestive) heart failure: Secondary | ICD-10-CM

## 2016-11-18 DIAGNOSIS — Z0000 Encounter for general adult medical examination without abnormal findings: Secondary | ICD-10-CM

## 2016-11-18 DIAGNOSIS — F32A Depression, unspecified: Secondary | ICD-10-CM

## 2016-11-18 DIAGNOSIS — Z794 Long term (current) use of insulin: Secondary | ICD-10-CM

## 2016-11-18 NOTE — Assessment & Plan Note (Signed)
euvolemic Follows at CHF clinic

## 2016-11-18 NOTE — Assessment & Plan Note (Signed)
BP well controlled Current regimen effective and well tolerated Continue current medications at current doses  

## 2016-11-18 NOTE — Assessment & Plan Note (Signed)
a1c well controlled  - monitored at [facility] current insulin regimen

## 2016-11-18 NOTE — Patient Instructions (Signed)
Mason Arnold , Thank you for taking time to come for your Medicare Wellness Visit. I appreciate your ongoing commitment to your health goals. Please review the following plan we discussed and let me know if I can assist you in the future.   These are the goals we discussed: Goals    None      This is a list of the screening recommended for you and due dates:  Health Maintenance  Topic Date Due  . Eye exam for diabetics  01/15/1951  . Shingles Vaccine  01/16/2000  . Hemoglobin A1C  02/18/2016  . Complete foot exam   11/18/2017  . Tetanus Vaccine  11/26/2023  . Flu Shot  Completed  . Pneumonia vaccines  Completed    Health Maintenance, Male A healthy lifestyle and preventative care can promote health and wellness.  Maintain regular health, dental, and eye exams.  Eat a healthy diet. Foods like vegetables, fruits, whole grains, low-fat dairy products, and lean protein foods contain the nutrients you need and are low in calories. Decrease your intake of foods high in solid fats, added sugars, and salt. Get information about a proper diet from your health care provider, if necessary.  Regular physical exercise is one of the most important things you can do for your health. Most adults should get at least 150 minutes of moderate-intensity exercise (any activity that increases your heart rate and causes you to sweat) each week. In addition, most adults need muscle-strengthening exercises on 2 or more days a week.   Maintain a healthy weight. The body mass index (BMI) is a screening tool to identify possible weight problems. It provides an estimate of body fat based on height and weight. Your health care provider can find your BMI and can help you achieve or maintain a healthy weight. For males 20 years and older:  A BMI below 18.5 is considered underweight.  A BMI of 18.5 to 24.9 is normal.  A BMI of 25 to 29.9 is considered overweight.  A BMI of 30 and above is considered  obese.  Maintain normal blood lipids and cholesterol by exercising and minimizing your intake of saturated fat. Eat a balanced diet with plenty of fruits and vegetables. Blood tests for lipids and cholesterol should begin at age 67 and be repeated every 5 years. If your lipid or cholesterol levels are high, you are over age 62, or you are at high risk for heart disease, you may need your cholesterol levels checked more frequently.Ongoing high lipid and cholesterol levels should be treated with medicines if diet and exercise are not working.  If you smoke, find out from your health care provider how to quit. If you do not use tobacco, do not start.  Lung cancer screening is recommended for adults aged 53-80 years who are at high risk for developing lung cancer because of a history of smoking. A yearly low-dose CT scan of the lungs is recommended for people who have at least a 30-pack-year history of smoking and are current smokers or have quit within the past 15 years. A pack year of smoking is smoking an average of 1 pack of cigarettes a day for 1 year (for example, a 30-pack-year history of smoking could mean smoking 1 pack a day for 30 years or 2 packs a day for 15 years). Yearly screening should continue until the smoker has stopped smoking for at least 15 years. Yearly screening should be stopped for people who develop a health problem  that would prevent them from having lung cancer treatment.  If you choose to drink alcohol, do not have more than 2 drinks per day. One drink is considered to be 12 oz (360 mL) of beer, 5 oz (150 mL) of wine, or 1.5 oz (45 mL) of liquor.  Avoid the use of street drugs. Do not share needles with anyone. Ask for help if you need support or instructions about stopping the use of drugs.  High blood pressure causes heart disease and increases the risk of stroke. High blood pressure is more likely to develop in:  People who have blood pressure in the end of the normal  range (100-139/85-89 mm Hg).  People who are overweight or obese.  People who are African American.  If you are 43-59 years of age, have your blood pressure checked every 3-5 years. If you are 84 years of age or older, have your blood pressure checked every year. You should have your blood pressure measured twice-once when you are at a hospital or clinic, and once when you are not at a hospital or clinic. Record the average of the two measurements. To check your blood pressure when you are not at a hospital or clinic, you can use:  An automated blood pressure machine at a pharmacy.  A home blood pressure monitor.  If you are 27-19 years old, ask your health care provider if you should take aspirin to prevent heart disease.  Diabetes screening involves taking a blood sample to check your fasting blood sugar level. This should be done once every 3 years after age 40 if you are at a normal weight and without risk factors for diabetes. Testing should be considered at a younger age or be carried out more frequently if you are overweight and have at least 1 risk factor for diabetes.  Colorectal cancer can be detected and often prevented. Most routine colorectal cancer screening begins at the age of 46 and continues through age 56. However, your health care provider may recommend screening at an earlier age if you have risk factors for colon cancer. On a yearly basis, your health care provider may provide home test kits to check for hidden blood in the stool. A small camera at the end of a tube may be used to directly examine the colon (sigmoidoscopy or colonoscopy) to detect the earliest forms of colorectal cancer. Talk to your health care provider about this at age 68 when routine screening begins. A direct exam of the colon should be repeated every 5-10 years through age 89, unless early forms of precancerous polyps or small growths are found.  People who are at an increased risk for hepatitis B should  be screened for this virus. You are considered at high risk for hepatitis B if:  You were born in a country where hepatitis B occurs often. Talk with your health care provider about which countries are considered high risk.  Your parents were born in a high-risk country and you have not received a shot to protect against hepatitis B (hepatitis B vaccine).  You have HIV or AIDS.  You use needles to inject street drugs.  You live with, or have sex with, someone who has hepatitis B.  You are a man who has sex with other men (MSM).  You get hemodialysis treatment.  You take certain medicines for conditions like cancer, organ transplantation, and autoimmune conditions.  Hepatitis C blood testing is recommended for all people born from [birth year] through 1966  and any individual with known risk factors for hepatitis C.  Healthy men should no longer receive prostate-specific antigen (PSA) blood tests as part of routine cancer screening. Talk to your health care provider about prostate cancer screening.  Testicular cancer screening is not recommended for adolescents or adult males who have no symptoms. Screening includes self-exam, a health care provider exam, and other screening tests. Consult with your health care provider about any symptoms you have or any concerns you have about testicular cancer.  Practice safe sex. Use condoms and avoid high-risk sexual practices to reduce the spread of sexually transmitted infections (STIs).  You should be screened for STIs, including gonorrhea and chlamydia if:  You are sexually active and are younger than 24 years.  You are older than 24 years, and your health care provider tells you that you are at risk for this type of infection.  Your sexual activity has changed since you were last screened, and you are at an increased risk for chlamydia or gonorrhea. Ask your health care provider if you are at risk.  If you are at risk of being infected with HIV, it  is recommended that you take a prescription medicine daily to prevent HIV infection. This is called pre-exposure prophylaxis (PrEP). You are considered at risk if:  You are a man who has sex with other men (MSM).  You are a heterosexual man who is sexually active with multiple partners.  You take drugs by injection.  You are sexually active with a partner who has HIV.  Talk with your health care provider about whether you are at high risk of being infected with HIV. If you choose to begin PrEP, you should first be tested for HIV. You should then be tested every 3 months for as long as you are taking PrEP.  Use sunscreen. Apply sunscreen liberally and repeatedly throughout the day. You should seek shade when your shadow is shorter than you. Protect yourself by wearing long sleeves, pants, a wide-brimmed hat, and sunglasses year round whenever you are outdoors.  Tell your health care provider of new moles or changes in moles, especially if there is a change in shape or color. Also, tell your health care provider if a mole is larger than the size of a pencil eraser.  A one-time screening for abdominal aortic aneurysm (AAA) and surgical repair of large AAAs by ultrasound is recommended for men aged 19-75 years who are current or former smokers.  Stay current with your vaccines (immunizations). This information is not intended to replace advice given to you by your health care provider. Make sure you discuss any questions you have with your health care provider. Document Released: 05/22/2008 Document Revised: 12/15/2014 Document Reviewed: 08/28/2015 Elsevier Interactive Patient Education  2017 [employer].

## 2016-11-18 NOTE — Progress Notes (Signed)
Subjective:    Patient ID: Mason Arnold, male    DOB: 1941-08-09, 75 y.o.   MRN: [id number]  HPI Here for medicare wellness exam and annual physical exam.   I have personally reviewed and have noted 1.The patient's medical and social history 2.Their use of alcohol, tobacco or illicit drugs 3.Their current medications and supplements 4.The patient's functional ability including ADL's, fall risks, home safety risks and hearing or visual impairment. 5.Diet and physical activities 6.Evidence for depression or mood disorders 7.Care team reviewed  - Cardio- Dr Caryl Comes, CHF clinic - Dr Tempie Hoist, [place] - Dr Sherral Hammers,  Nephrologist - at [facility], [place] doctor at [facility]   Are there smokers in your home (other than you)? No  Risk Factors Exercise:  None, tries to stay active Dietary issues discussed:  Well balanced, healthy  Cardiac risk factors: advanced age, hypertension, hyperlipidemia   Depression Screen  Have you felt down, depressed or hopeless? No  Have you felt little interest or pleasure in doing things?  No  Activities of Daily Living In your present state of health, do you have any difficulty performing the following activities?:  Driving? No, drives infrequently due to prior syncopal episode Managing money?  No Feeding yourself? No Getting from bed to chair? No Climbing a flight of stairs? No Preparing food and eating?: No Bathing or showering? No Getting dressed: No Getting to/using the toilet? No Moving around from place to place: No In the past year have you fallen or had a near fall?: No   Are you sexually active?  No  Do you have more than one partner?  N/A  Hearing Difficulties: yes - wears hearing aids Do you often ask people to speak up or repeat themselves? yes Do you experience ringing or noises in your ears? No Do you have difficulty understanding soft or whispered voices? yes Vision:              Any  change in vision:   no              Up to date with eye exam: Up to date  Memory:  Do you feel that you have a problem with memory? No  Do you often misplace items? No  Do you feel safe at home?  Yes  Cognitive Testing  Alert, Orientated? Yes  Normal Appearance? Yes  Recall of three objects?  Yes  Can perform simple calculations? Yes  Displays appropriate judgment? Yes  Can read the correct time from a watch face? Yes   Advanced Directives have been discussed with the patient? Yes     Medications and allergies reviewed with patient and updated if appropriate.  Patient Active Problem List   Diagnosis Date Noted  . Syncope 06/29/2013  . Ventricular tachycardia ([place]) 06/29/2013  . Depression 11/30/2012  . ICD-CRT  [place] 11/28/2011  . Atrial fibrillation ([place]) 02/03/2011  . Chronic systolic heart failure ([place]) 10/01/2010  . Coronary atherosclerosis 06/20/2010  . HYPOGLYCEMIA, HX OF 05/21/2010  . Cardiomyopathy, ischemic 06/12/2009  . Asymptomatic hyperuricemia 08/04/2008  . HYPERLIPIDEMIA 02/11/2008  . ORTHOSTATIC HYPOTENSION 02/11/2008  . Diabetes ([place]) 10/18/2007  . Essential hypertension 10/18/2007  . GOUT 03/26/2007    Current Outpatient Prescriptions on File Prior to Visit  Medication Sig Dispense Refill  . acetaminophen (TYLENOL) 500 MG tablet Take 500 mg by mouth every 6 (six) hours as needed. For pain    . albuterol (PROVENTIL HFA;VENTOLIN HFA) 108 (90 Base) MCG/ACT inhaler Inhale 2 puffs  into the lungs every 4 (four) hours as needed for wheezing. 1 Inhaler 6  . allopurinol (ZYLOPRIM) 100 MG tablet Take 1 tablet (100 mg total) by mouth daily. 30 tablet 2  . calcium carbonate (OS-CAL) 600 MG TABS Take 600 mg by mouth daily.      . carvedilol (COREG) 3.125 MG tablet TAKE 1 TABLET BY MOUTH TWICE DAILY WITH MEALS 60 tablet 3  . colchicine 0.6 MG tablet Take 0.6 mg by mouth daily as needed (gouty flare).    Marland Kitchen digoxin (LANOXIN) 0.125 MG tablet Take 0.0625 mg by mouth  daily.    Marland Kitchen ELIQUIS 5 MG TABS tablet TAKE 1 TABLET BY MOUTH TWICE A DAY 60 tablet 3  . famotidine (PEPCID) 10 MG tablet Take 10 mg by mouth daily.     Marland Kitchen FLUoxetine (PROZAC) 20 MG capsule Take 1 capsule (20 mg total) by mouth daily. 90 capsule 1  . furosemide (LASIX) 80 MG tablet Take 1 tablet (80 mg total) by mouth 2 (two) times daily. 60 tablet 6  . insulin glargine (LANTUS) 100 UNIT/ML injection Inject 45 Units into the skin at bedtime. Sliding scale    . loratadine (CLARITIN) 10 MG tablet Take 10 mg by mouth daily as needed. For allergies    . metolazone (ZAROXOLYN) 2.5 MG tablet Take 1 tablet (2.5 mg total) by mouth daily as needed. For fluid or edema 30 tablet 3  . Multiple Vitamin (MULTIVITAMIN) tablet Take 1 tablet by mouth daily.     Marland Kitchen omega-3 acid ethyl esters (LOVAZA) 1 G capsule Take 2 g by mouth 2 (two) times daily.      . potassium chloride SA (K-DUR,KLOR-CON) 20 MEQ tablet Take 1 tablet (20 mEq total) by mouth daily. 90 tablet 3  . ramipril (ALTACE) 2.5 MG capsule TAKE 1 CAPSULE BY MOUTH ONCE DAILY 90 capsule 5  . spironolactone (ALDACTONE) 25 MG tablet Take 25 mg by mouth daily.      . tamsulosin (FLOMAX) 0.4 MG CAPS capsule Take 0.4 mg by mouth daily after supper.     No current facility-administered medications on file prior to visit.     Past Medical History:  Diagnosis Date  . Anginal pain ([place])   . Congestive heart failure ([place])   . Diabetes mellitus type II    IDDM , [facility]  . Gout   . Gout    roast beef is a trigger  . Heart murmur   . Hyperlipidemia   . Hypertension   . ICD (implantable cardiac defibrillator) in place   . Ischemic heart disease   . Left bundle branch block   . Myocardial infarction 2000; 2002; 2004; ~ 2006  . Pacemaker   . Pneumonia    "once, I think" (12/16/2012)  . Ventricular tachycardia [facility])     Past Surgical History:  Procedure Laterality Date  . A-V CARDIAC PACEMAKER INSERTION  2002; 2010  . BIV ICD GENERTAOR CHANGE OUT N/A 12/16/2012    Procedure: BIV ICD GENERTAOR CHANGE OUT;  Surgeon: Deboraha Sprang, MD;  Location[facility] C[facility] LAB;  Service: Cardiovascular;  Laterality: N/A;  . CARDIAC CATHETERIZATION     "3 or 4; never had interventions" (12/16/2012)  . CARDIAC CATHETERIZATION N/A 04/11/2016   Procedure: Right Heart Cath;  Surgeon: Jolaine Artist, MD;  Location: [facility] CV LAB;  Service: Cardiovascular;  Laterality: N/A;  . CORONARY ARTERY BYPASS GRAFT  2000   CABG X4  . INSERT / REPLACE / REMOVE PACEMAKER  12/16/2012  LV lead placement; ICD assessment   . LEFT AND RIGHT HEART CATHETERIZATION WITH CORONARY/GRAFT ANGIOGRAM N/A 04/04/2014   Procedure: LEFT AND RIGHT HEART CATHETERIZATION WITH Beatrix Fetters;  Surgeon: Jolaine Artist, MD;  Location: [facility] CATH LAB;  Service: Cardiovascular;  Laterality: N/A;  . [place]  . URACHAL CYST EXCISION  ~ 1995  . VASECTOMY  ?32    Social History   Social History  . Marital status: Married    Spouse name: N/A  . Number of children: N/A  . Years of education: N/A   Social History Main Topics  . Smoking status: Never Smoker  . Smokeless tobacco: Former Systems developer    Types: Snuff, Chew     Comment: 12/16/2012 "stopped chew/snuff in ~ 1992 after 15 yr"  . Alcohol use 4.2 oz/week    7 Shots of liquor per week     Comment: one shot a day  . Drug use: No     Comment: none  . Sexual activity: Yes   Other Topics Concern  . Not on file   Social History Narrative   Lives with wife in [place].     Family History  Problem Relation Age of Onset  . Heart attack Maternal Grandfather 78  . Heart attack Maternal Grandmother 82  . Heart attack Paternal Grandfather 45  . Heart attack Father 58  . Heart failure Father 52  . Aneurysm Brother   . Diabetes Neg Hx   . Stroke Neg Hx   . Cancer Neg Hx     Review of Systems  Constitutional: Negative for appetite change, chills, fatigue, fever and unexpected weight change.  HENT: Positive  for hearing loss. Negative for tinnitus.   Eyes: Negative for visual disturbance.  Respiratory: Positive for cough (occ) and shortness of breath (with activity). Negative for wheezing.   Cardiovascular: Negative for chest pain, palpitations and leg swelling.  Gastrointestinal: Positive for diarrhea (occ). Negative for abdominal pain, blood in stool, constipation and nausea.       No gerd  Genitourinary: Negative for difficulty urinating, dysuria and hematuria.  Musculoskeletal: Positive for arthralgias. Negative for back pain.  Skin: Negative for color change and rash.  Neurological: Negative for light-headedness and headaches.  Psychiatric/Behavioral: Negative for dysphoric mood. The patient is not nervous/anxious.        Objective:   Vitals:   11/18/16 1333  BP: 104/64  Pulse: 70  Resp: 16  Temp: 97.5 F (36.4 C)   Filed Weights   11/18/16 1333  Weight: 163 lb (73.9 kg)   Body mass index is 27.12 kg/m.   Physical Exam Constitutional: Mason Arnold appears well-developed and well-nourished. No distress.  HENT:  Head: Normocephalic and atraumatic.  Right Ear: External ear normal.  Left Ear: External ear normal.  Mouth/Throat: Oropharynx is clear and moist.  Normal ear canals and TM b/l  Eyes: Conjunctivae and EOM are normal.  Neck: Neck supple. No tracheal deviation present. No thyromegaly present.  No carotid bruit  Cardiovascular: Normal rate, regular rhythm, normal heart sounds and intact distal pulses.  2/6 systolic murmur heard. Pulmonary/Chest: Effort normal and breath sounds normal. No respiratory distress. Mason Arnold has no wheezes. Mason Arnold has no rales.  Abdominal: Soft. Bowel sounds are normal. Mason Arnold exhibits no distension. There is no tenderness.  Genitourinary:  deferred  Musculoskeletal: Mason Arnold exhibits no edema.  Lymphadenopathy:    Mason Arnold has no cervical adenopathy.  Skin: Skin is warm and dry. Mason Arnold is not diaphoretic.  Psychiatric:  Mason Arnold has a normal mood and affect. His behavior is normal.          Assessment & Plan:   Wellness Exam: Immunizations  Discussed shingles vaccine, other up to date Colonoscopy  Up to date  Eye exam  Up to date  Hearing loss   - yes, wears hearing aids Memory concerns/difficulties  - none Independent of ADLs  - fully - driving limited, but can still drive  Stressed the importance of regular exercise   Patient received copy of preventative screening tests/immunizations recommended for the next 5-10 years.   Physical exam: Screening blood work - done by [employer] - will get report Immunizations  Discussed shingles, others up to date Colonoscopy  Up to date  Eye exams  Up to date  Exercise - none, but tries to stay active Weight - good for his age and medical conditions Skin  - none - will see derm through [place] Substance abuse  none  See Problem List for Assessment and Plan of chronic medical problems.

## 2016-11-18 NOTE — Assessment & Plan Note (Signed)
Controlled, stable Continue current dose of medication  

## 2016-11-18 NOTE — Progress Notes (Signed)
Pre visit review using our clinic review tool, if applicable. No additional management support is needed unless otherwise documented below in the visit note. 

## 2016-11-18 NOTE — Assessment & Plan Note (Signed)
Rate controlled, on eliquis Following with cardiology

## 2016-11-25 ENCOUNTER — Telehealth: Payer: Self-pay | Admitting: Cardiology

## 2016-11-25 ENCOUNTER — Ambulatory Visit (INDEPENDENT_AMBULATORY_CARE_PROVIDER_SITE_OTHER): Payer: Medicare Other

## 2016-11-25 DIAGNOSIS — I5022 Chronic systolic (congestive) heart failure: Secondary | ICD-10-CM | POA: Diagnosis not present

## 2016-11-25 DIAGNOSIS — Z95810 Presence of automatic (implantable) cardiac defibrillator: Secondary | ICD-10-CM | POA: Diagnosis not present

## 2016-11-25 NOTE — Progress Notes (Signed)
EPIC Encounter for ICM Monitoring  Patient Name: Mason Arnold is a 75 y.o. male Date: 11/25/2016 Primary Care Physican: Binnie Rail, MD Primary Cardiologist:Bensimhon Electrophysiologist: Caryl Comes Dry Weight:161lb  Bi-V Pacing: 98%          Heart Failure questions reviewed, pt asymptomatic.  He denied any dizziness or lightheadedness.   Thoracic impedance above baseline in the last few days suggesting dryness.   Recommendations: Encouraged to stay hydrated and limit fluid intake to 2 liters a day and to limit salt intake to 2000 mg a day.   Encouraged to call for fluid symptoms.    Follow-up plan: ICM clinic phone appointment on 12/26/2016.  Copy of ICM check sent to primary cardiologist and device physician.   ICM trend: 11/25/2016       Rosalene Billings, RN 11/25/2016 5:10 PM

## 2016-11-25 NOTE — Telephone Encounter (Signed)
Spoke with pt and reminded pt of remote transmission that is due today. Pt verbalized understanding.   

## 2016-11-28 ENCOUNTER — Ambulatory Visit (INDEPENDENT_AMBULATORY_CARE_PROVIDER_SITE_OTHER): Payer: Medicare Other | Admitting: Internal Medicine

## 2016-11-28 ENCOUNTER — Encounter: Payer: Self-pay | Admitting: Internal Medicine

## 2016-11-28 ENCOUNTER — Ambulatory Visit (INDEPENDENT_AMBULATORY_CARE_PROVIDER_SITE_OTHER)
Admission: RE | Admit: 2016-11-28 | Discharge: 2016-11-28 | Disposition: A | Discharge status: Home / Self Care | Payer: Medicare Other | Source: Ambulatory Visit | Attending: Internal Medicine | Admitting: Internal Medicine

## 2016-11-28 VITALS — BP 132/70 | HR 100 | Resp 20 | Wt 166.0 lb

## 2016-11-28 DIAGNOSIS — M25571 Pain in right ankle and joints of right foot: Secondary | ICD-10-CM

## 2016-11-28 DIAGNOSIS — M25572 Pain in left ankle and joints of left foot: Secondary | ICD-10-CM

## 2016-11-28 DIAGNOSIS — M79672 Pain in left foot: Secondary | ICD-10-CM

## 2016-11-28 NOTE — Assessment & Plan Note (Signed)
With persistent but less pain, no effusion, but with bruising to lateral ankle and foot, also for film - r/o fx

## 2016-11-28 NOTE — Progress Notes (Signed)
Pre visit review using our clinic review tool, if applicable. No additional management support is needed unless otherwise documented below in the visit note. 

## 2016-11-28 NOTE — Progress Notes (Signed)
Subjective:    Patient ID: Mason Arnold, male    DOB: [birth year], 75 y.o.   MRN: [id number]  HPI  Here to f/u after 3 days ago simply stepped off a 6 ft ladder not thinking apparently, landed on ground outside the house; happened so quickly he's not sure but thinks most force on landing may have been to the left ankle, but some to right foot and ankle as well since has pain and bruising there.  He thinks not very likely fracture, but pain and swelling to left ankle persists, maybe worse despite icing , limps more to walk, walks with a cane today, and wife had him come today for evaluation. Pt denies chest pain, increased sob or doe, wheezing, orthopnea, PND, increased LE swelling, palpitations, dizziness or syncope.  No other injury.  Ankle brace on the left has helped with ambulation  No other falls  Does not want pain med Past Medical History:  Diagnosis Date  . Anginal pain ([place])   . Congestive heart failure ([place])   . Diabetes mellitus type II    IDDM , VAH  . Gout   . Gout    roast beef is a trigger  . Heart murmur   . Hyperlipidemia   . Hypertension   . ICD (implantable cardiac defibrillator) in place   . Ischemic heart disease   . Left bundle branch block   . Myocardial infarction 2000; 2002; 2004; ~ 2006  . Pacemaker   . Pneumonia    "once, I think" (12/16/2012)  . Ventricular tachycardia [facility])    Past Surgical History:  Procedure Laterality Date  . A-V CARDIAC PACEMAKER INSERTION  2002; 2010  . BIV ICD GENERTAOR CHANGE OUT N/A 12/16/2012   Procedure: BIV ICD GENERTAOR CHANGE OUT;  Surgeon: Deboraha Sprang, MD;  Location: [facility] CATH LAB;  Service: Cardiovascular;  Laterality: N/A;  . CARDIAC CATHETERIZATION     "3 or 4; never had interventions" (12/16/2012)  . CARDIAC CATHETERIZATION N/A 04/11/2016   Procedure: Right Heart Cath;  Surgeon: Jolaine Artist, MD;  Location: [place] CV LAB;  Service: Cardiovascular;  Laterality: N/A;  . CORONARY ARTERY BYPASS GRAFT  2000   CABG  X4  . INSERT / REPLACE / REMOVE PACEMAKER  12/16/2012   LV lead placement; ICD assessment   . LEFT AND RIGHT HEART CATHETERIZATION WITH CORONARY/GRAFT ANGIOGRAM N/A 04/04/2014   Procedure: LEFT AND RIGHT HEART CATHETERIZATION WITH Beatrix Fetters;  Surgeon: Jolaine Artist, MD;  Location: [facility] CATH LAB;  Service: Cardiovascular;  Laterality: N/A;  . [place]  . URACHAL CYST EXCISION  ~ 1995  . VASECTOMY  ?1967    reports that he has never smoked. He has quit using smokeless tobacco. His smokeless tobacco use included Snuff and Chew. He reports that he drinks about 4.2 oz of alcohol per week . He reports that he does not use drugs. family history includes Aneurysm in his brother; Heart attack (age of onset: 66) in his father; Heart attack (age of onset: 7) in his maternal grandfather; Heart attack (age of onset: 36) in his paternal grandfather; Heart attack (age of onset: 2) in his maternal grandmother; Heart failure (age of onset: 9) in his father. Allergies  Allergen Reactions  . Ampicillin Rash  . Crestor [Rosuvastatin Calcium] Rash and Other (See Comments)    Rash as nodules ("like strawberries")  . Orange Fruit [Citrus] Swelling and Other (See Comments)    Lips swelling, severe headache   .  Propoxyphene N-Acetaminophen Other (See Comments)    hallucinations  [darvicet]   Current Outpatient Prescriptions on File Prior to Visit  Medication Sig Dispense Refill  . acetaminophen (TYLENOL) 500 MG tablet Take 500 mg by mouth every 6 (six) hours as needed. For pain    . albuterol (PROVENTIL HFA;VENTOLIN HFA) 108 (90 Base) MCG/ACT inhaler Inhale 2 puffs into the lungs every 4 (four) hours as needed for wheezing. 1 Inhaler 6  . allopurinol (ZYLOPRIM) 100 MG tablet Take 1 tablet (100 mg total) by mouth daily. 30 tablet 2  . calcium carbonate (OS-CAL) 600 MG TABS Take 600 mg by mouth daily.      . carvedilol (COREG) 3.125 MG tablet TAKE 1 TABLET BY MOUTH  TWICE DAILY WITH MEALS 60 tablet 3  . colchicine 0.6 MG tablet Take 0.6 mg by mouth daily as needed (gouty flare).    Marland Kitchen digoxin (LANOXIN) 0.125 MG tablet Take 0.0625 mg by mouth daily.    Marland Kitchen ELIQUIS 5 MG TABS tablet TAKE 1 TABLET BY MOUTH TWICE A DAY 60 tablet 3  . famotidine (PEPCID) 10 MG tablet Take 10 mg by mouth daily.     Marland Kitchen FLUoxetine (PROZAC) 20 MG capsule Take 1 capsule (20 mg total) by mouth daily. 90 capsule 1  . furosemide (LASIX) 80 MG tablet Take 1 tablet (80 mg total) by mouth 2 (two) times daily. 60 tablet 6  . insulin glargine (LANTUS) 100 UNIT/ML injection Inject 45 Units into the skin at bedtime. Sliding scale    . loratadine (CLARITIN) 10 MG tablet Take 10 mg by mouth daily as needed. For allergies    . Multiple Vitamin (MULTIVITAMIN) tablet Take 1 tablet by mouth daily.     Marland Kitchen omega-3 acid ethyl esters (LOVAZA) 1 G capsule Take 2 g by mouth 2 (two) times daily.      . potassium chloride SA (K-DUR,KLOR-CON) 20 MEQ tablet Take 1 tablet (20 mEq total) by mouth daily. 90 tablet 3  . ramipril (ALTACE) 2.5 MG capsule TAKE 1 CAPSULE BY MOUTH ONCE DAILY 90 capsule 5  . spironolactone (ALDACTONE) 25 MG tablet Take 25 mg by mouth daily.      . tamsulosin (FLOMAX) 0.4 MG CAPS capsule Take 0.4 mg by mouth daily after supper.    . metolazone (ZAROXOLYN) 2.5 MG tablet Take 1 tablet (2.5 mg total) by mouth daily as needed. For fluid or edema 30 tablet 3   No current facility-administered medications on file prior to visit.    Review of Systems  Constitutional: Negative for unusual diaphoresis or night sweats HENT: Negative for ear swelling or discharge Eyes: Negative for worsening visual haziness  Respiratory: Negative for choking and stridor.   Gastrointestinal: Negative for distension or worsening eructation Genitourinary: Negative for retention or change in urine volume.  Musculoskeletal: Negative for other MSK pain or swelling Skin: Negative for color change and worsening  wound Neurological: Negative for tremors and numbness other than noted  Psychiatric/Behavioral: Negative for decreased concentration or agitation other than above   All other system neg per pt    Objective:   Physical Exam BP 132/70   Pulse 100   Resp 20   Wt 166 lb (75.3 kg)   SpO2 96%   BMI 27.62 kg/m  VS noted,  Constitutional: Pt appears in no apparent distress HENT: Head: NCAT.  Right Ear: External ear normal.  Left Ear: External ear normal.  Eyes: . Pupils are equal, round, and reactive to light. Conjunctivae and EOM  are normal Neck: Normal range of motion. Neck supple.  Cardiovascular: Normal rate and regular rhythm.   Pulmonary/Chest: Effort normal and breath sounds without rales or wheezing.  Left ankle 1-2+ effusion, tender, warm with reduced ROM Left foot with mild dorsal swelling Right ankle mild diffuse tender without effusion LE's o/w neurovasc intact Neurological: Pt is alert. Not confused , motor grossly intact Skin: Skin is warm. No rash, no LE edema Psychiatric: Pt behavior is normal. No agitation.  No other new exam findings    Assessment & Plan:

## 2016-11-28 NOTE — Patient Instructions (Addendum)
Please continue all other medications as before, and refills have been done if requested.  Please have the pharmacy call with any other refills you may need.  Please keep your appointments with your specialists as you may have planned  Please go to the XRAY Department in the Basement (go straight as you get off the elevator) for the x-ray testing  You will be contacted by phone if any changes need to be made immediately.  Otherwise, you will receive a letter about your results with an explanation, but please check with MyChart first.  Please remember to sign up for MyChart if you have not done so, as this will be important to you in the future with finding out test results, communicating by private email, and scheduling acute appointments online when needed.  Please see Dr Smith/sports medicine in this office if not improving in 2-3 weeks

## 2016-11-28 NOTE — Assessment & Plan Note (Signed)
With post traumatic effusion, for film r/o fx, declines pain med, cont ankle brace, also to f/u sport med if not improved

## 2016-11-28 NOTE — Assessment & Plan Note (Signed)
With sympathetic swelling most likely, less likely fx but for films as well

## 2016-12-18 ENCOUNTER — Telehealth: Payer: Self-pay | Admitting: Cardiology

## 2016-12-18 NOTE — Telephone Encounter (Signed)
Spoke w/ pt wife and requested that pt send a manual transmission b/c his home monitor has not updated in at least 7 days.

## 2016-12-23 ENCOUNTER — Encounter (HOSPITAL_COMMUNITY): Payer: Self-pay

## 2016-12-23 ENCOUNTER — Ambulatory Visit (HOSPITAL_COMMUNITY)
Admission: RE | Admit: 2016-12-23 | Discharge: 2016-12-23 | Disposition: A | Discharge status: Home / Self Care | Payer: Medicare Other | Source: Ambulatory Visit | Attending: Cardiology | Admitting: Cardiology

## 2016-12-23 VITALS — BP 110/76 | HR 73 | Wt 165.5 lb

## 2016-12-23 DIAGNOSIS — I130 Hypertensive heart and chronic kidney disease with heart failure and stage 1 through stage 4 chronic kidney disease, or unspecified chronic kidney disease: Secondary | ICD-10-CM | POA: Insufficient documentation

## 2016-12-23 DIAGNOSIS — M109 Gout, unspecified: Secondary | ICD-10-CM | POA: Insufficient documentation

## 2016-12-23 DIAGNOSIS — Z7901 Long term (current) use of anticoagulants: Secondary | ICD-10-CM | POA: Diagnosis not present

## 2016-12-23 DIAGNOSIS — E785 Hyperlipidemia, unspecified: Secondary | ICD-10-CM | POA: Diagnosis not present

## 2016-12-23 DIAGNOSIS — Z95810 Presence of automatic (implantable) cardiac defibrillator: Secondary | ICD-10-CM | POA: Diagnosis not present

## 2016-12-23 DIAGNOSIS — E1122 Type 2 diabetes mellitus with diabetic chronic kidney disease: Secondary | ICD-10-CM | POA: Insufficient documentation

## 2016-12-23 DIAGNOSIS — R55 Syncope and collapse: Secondary | ICD-10-CM

## 2016-12-23 DIAGNOSIS — Z951 Presence of aortocoronary bypass graft: Secondary | ICD-10-CM | POA: Diagnosis not present

## 2016-12-23 DIAGNOSIS — I255 Ischemic cardiomyopathy: Secondary | ICD-10-CM | POA: Diagnosis not present

## 2016-12-23 DIAGNOSIS — I2510 Atherosclerotic heart disease of native coronary artery without angina pectoris: Secondary | ICD-10-CM | POA: Insufficient documentation

## 2016-12-23 DIAGNOSIS — Z794 Long term (current) use of insulin: Secondary | ICD-10-CM | POA: Insufficient documentation

## 2016-12-23 DIAGNOSIS — I5022 Chronic systolic (congestive) heart failure: Secondary | ICD-10-CM | POA: Insufficient documentation

## 2016-12-23 DIAGNOSIS — I480 Paroxysmal atrial fibrillation: Secondary | ICD-10-CM | POA: Diagnosis not present

## 2016-12-23 DIAGNOSIS — N189 Chronic kidney disease, unspecified: Secondary | ICD-10-CM | POA: Diagnosis not present

## 2016-12-23 LAB — BASIC METABOLIC PANEL
Anion gap: 11 (ref 5–15)
BUN: 15 mg/dL (ref 6–20)
CO2: 27 mmol/L (ref 22–32)
CREATININE: 1.27 mg/dL — AB (ref 0.61–1.24)
Calcium: 9.5 mg/dL (ref 8.9–10.3)
Chloride: 99 mmol/L — ABNORMAL LOW (ref 101–111)
GFR calc Af Amer: >60 mL/min (ref >60)
GFR, EST NON AFRICAN AMERICAN: 53 mL/min — AB (ref >60)
Glucose, Bld: 137 mg/dL — ABNORMAL HIGH (ref 65–99)
Potassium: 4.3 mmol/L (ref 3.5–5.1)
SODIUM: 137 mmol/L (ref 135–145)

## 2016-12-23 LAB — DIGOXIN LEVEL: DIGOXIN LVL: 0.2 ng/mL — AB (ref 0.8–2.0)

## 2016-12-23 MED ORDER — SACUBITRIL-VALSARTAN 24-26 MG PO TABS: 1.0000 | ORAL_TABLET | Freq: Two times a day (BID) | ORAL | 3 refills | Status: DC

## 2016-12-23 NOTE — Patient Instructions (Signed)
Stop Ramipril  Start Entresto 24/26 mg Twice daily STARTING THUR 1/18 AM  Labs today  Your physician recommends that you schedule a follow-up appointment in: 2-3 months

## 2016-12-23 NOTE — Progress Notes (Signed)
Patient ID: Mason Arnold, male   DOB: 1941/08/10, 76 y.o.   MRN: [id number]   ADVANCED HF CLINIC NOTE PCP: Dr. Linna Darner Primary cardiologist: Dr. Stanford Breed EP: Dr Caryl Comes  Mason Arnold is a 76  yo with history of CAD s/p CABG 2000, ischemic cardiomyopathy (20%) with [employer] CRT-D, DM2, paroxysmal atrial fibrillation, and recurrent syncope of uncertain etiology.    He had RHC/LHC in 4/15 showing patent grafts but elevated filling pressures and low cardiac index (1.9 Fick/2.2 thermo).   Follow up for Heart Failure: He returns for HF follow up with his wife. About a month ago fell off a 6-foot ladder. No syncope. Now improved. Weight at home 161-164 pounds. Denies PND/Orthopnea/CP. Overall more episodes of SOB lately. Notes that when he gets up to 165 he gets short of breath. Takes lasix 80 bid. Occasionally takes extra lasix. Walks dog occasionally. Taking all medications.   ICD interrogated personally. NO VT/AF  Fluid up and down. Up today. Nearing ERI. (Has heard from Dr. Caryl Comes)  ECHO 04/16/2016: EF 22% IVC normal.  Echo 8/15: EF 15-20% RV moderate dysfunction.   [place] 04/2016 RA = 7 RV = 48/2/8 PA = 54/20 (33) PCW = 19 Fick cardiac output/index = 4.2/2.4 PVR = 3.3 WU Ao sat = 93% PA sat = 57%, 60%  Last cath 4/15 RA = 9 RV = 69/0/13 PA = 61/24 (38) PCW = 29 Fick cardiac output/index = 3.4/1.9 Thermo CO/CI = 4.1 /2.2 PVR = 2.7 [place] FA sat = 94% PA sat = 57%, 57% Ao Pressure: 104/58 (77) LV Pressure: 103/13/29  Left main: Mild plaque LAD: Totally occluded ostially LCX: Small ramus with high-grade ostial disease. AV groove LCX appears occluded in mid section. OM-1 occluded proximally. RCA: Unable to be cannulated. Suspect occluded ostially.  LV-gram done in the RAO projection: Ejection fraction = 15%. Basal inferior and anterior walls only sections that contract.  LIMA to LAD: Patent SVG to Ramus: Patent SVG to OM-3: Patent SVG to R PDA: Patent. The RCA is occluded prior to  take-off of PDA. There are collaterals from distal RCA to OM-1 and probably a septal perforator.  CPX 04/24/14: FVC 2.70 (78%)  FEV1 2.15 (86%)  FEV1/FVC 80%  MVV 84 (80%) Resting HR: 65 Peak HR: 134 (92% age predicted max HR) Peak VO2: 19.6 (76.1% predicted peak VO2) VE/VCO2 slope: 34.8 OUES: 1.58 Peak RER: 1.23 Ventilatory Threshold: 15.0 (58.3% predicted peak VO2) Peak RR 46 Peak Ventilation: 68.1 VE/MVV: 81% PETCO2 at peak: 30 O2pulse: 11 (76% predicted O2pulse)  Labs (4/15): K 4.7, creatinine 1.4 Labs (5/15): K 3.7 Cr 1.56 pBNP 1,353 Labs (8/15): K 4.8, creatinine 1.6, pBNP 724 Labs (11/13/2014): K 3.6 Creatinine 1.79 dig level 0.5   PMH: 1. HTN 2. Hyperlipidemia 3. CAD: s/p CABG with LIMA-LAD, SVG-ramus, SVG-OM3, SVG-PDA.  LHC (4/15) with patent grafts.  4. Ischemic cardiomyopathy: Echo (8/14) with EF 20-25% with regional WMAs, mild MR, mildly decreased RV systolic function.  EF LV-gram (4/15) was 15%. He has a [employer] officer, political party CRT-D device (upgraded 1/14).  RHC (4/15): mean RA 9, PA 61/24 (mean 38), mean PCWP 29, CI 1.9 Fick/2.2 thermo, PVR 2.7. CPX (04/2014): Peak VO2: 19.6 (76% predicted peak VO2), VE/VCO2: 34.8, Peak RER 1.23.  Echo (10/15) with EF 20-25% with regional wall motion abnormalities, normal RV size with mild to moderately decreased systolic function, mild AS, mild MR.  5. Syncopal episodes: Relatively frequent, etiology uncertain.  He does get lightheaded/near-syncopal with coughing spells, but he has  syncope at other times also with no warning.  Since device has been in place, he has had VT detected but it has not been clearly correlated with syncopal episodes. Suspect vasomotor.  6. Atrial fibrillation: Paroxysmal.  Has not been anticoagulated due to relatively frequent syncope/falls.  7. Gout 8. H/o LBBB 9. Type II diabetes 10. CKD  SH: Married, lives in [place], nonsmoker, rare ETOH.   FH: CAD  ROS: All systems reviewed and negative except as per HPI.    Current Outpatient Prescriptions  Medication Sig Dispense Refill  . acetaminophen (TYLENOL) 500 MG tablet Take 500 mg by mouth every 6 (six) hours as needed. For pain    . albuterol (PROVENTIL HFA;VENTOLIN HFA) 108 (90 Base) MCG/ACT inhaler Inhale 2 puffs into the lungs every 4 (four) hours as needed for wheezing. 1 Inhaler 6  . allopurinol (ZYLOPRIM) 100 MG tablet Take 1 tablet (100 mg total) by mouth daily. 30 tablet 2  . calcium carbonate (OS-CAL) 600 MG TABS Take 600 mg by mouth daily.      . carvedilol (COREG) 3.125 MG tablet TAKE 1 TABLET BY MOUTH TWICE DAILY WITH MEALS 60 tablet 3  . colchicine 0.6 MG tablet Take 0.6 mg by mouth daily as needed (gouty flare).    Marland Kitchen digoxin (LANOXIN) 0.125 MG tablet Take 0.0625 mg by mouth daily.    Marland Kitchen ELIQUIS 5 MG TABS tablet TAKE 1 TABLET BY MOUTH TWICE A DAY 60 tablet 3  . famotidine (PEPCID) 10 MG tablet Take 10 mg by mouth daily.     Marland Kitchen FLUoxetine (PROZAC) 20 MG capsule Take 1 capsule (20 mg total) by mouth daily. 90 capsule 1  . furosemide (LASIX) 80 MG tablet Take 1 tablet (80 mg total) by mouth 2 (two) times daily. 60 tablet 6  . insulin glargine (LANTUS) 100 UNIT/ML injection Inject 45 Units into the skin at bedtime. Sliding scale    . loratadine (CLARITIN) 10 MG tablet Take 10 mg by mouth daily as needed. For allergies    . Multiple Vitamin (MULTIVITAMIN) tablet Take 1 tablet by mouth daily.     Marland Kitchen omega-3 acid ethyl esters (LOVAZA) 1 G capsule Take 2 g by mouth 2 (two) times daily.      . potassium chloride SA (K-DUR,KLOR-CON) 20 MEQ tablet Take 1 tablet (20 mEq total) by mouth daily. 90 tablet 3  . ramipril (ALTACE) 2.5 MG capsule TAKE 1 CAPSULE BY MOUTH ONCE DAILY 90 capsule 5  . spironolactone (ALDACTONE) 25 MG tablet Take 25 mg by mouth daily.      . tamsulosin (FLOMAX) 0.4 MG CAPS capsule Take 0.4 mg by mouth daily after supper.    . metolazone (ZAROXOLYN) 2.5 MG tablet Take 1 tablet (2.5 mg total) by mouth daily as needed. For fluid or  edema (Patient not taking: Reported on 12/23/2016) 30 tablet 3   No current facility-administered medications for this encounter.     BP 110/76   Pulse 73   Wt 165 lb 8 oz (75.1 kg)   SpO2 98%   BMI 27.54 kg/m  General: NAD wife present . Ambulated in the clinic without difficulty.  Neck: JVP 7 no thyromegaly or thyroid nodule.  Lungs: Clear to auscultation bilaterally with normal respiratory effort. CV: Nondisplaced PMI.  Heart regular S1/S2, no S3/S4, no murmur.  No peripheral edema.  No carotid bruit.  Normal pedal pulses.  Abdomen: Soft, nontender, no hepatosplenomegaly, no distention.  Skin: Intact without lesions or rashes.  Neurologic: Alert and  oriented x 3.  Psych: Normal affect. Extremities: No clubbing or cyanosis.   Assessment/Plan: 1. Chronic systolic CHF: Ischemic cardiomyopathy s/p St Jude CRT-D, EF 20%(04/2016 )  -NYHA II.  -Volume status up and down by history and [place]. When he is 160-161 pounds he is fine when he is 164 or greater he gets symptoms.  - Will continue lasix 80 mg BID and metolazone as needed for weight 164 lbs or greater. We will change ramipril to Entresto 24/26 bid and that should help with fluid as well.  - Continue spironolactone, digoxin and Coreg. Dig level 03/2016 0.2 . Recheck labs today - Reinforced the need and importance of daily weights, a low sodium diet, and fluid restriction (less than 2 L a day). Instructed to call the HF clinic if weight increases more than 3 lbs overnight or 5 lbs in a week.  2. CAD: Patent grafts on LHC in 4/15.  No chest pain. Continue statin.  He is on Eliquis so no longer taking aspirin.   3. Paroxysmal atrial fibrillation: No recurrent AF on ICD interrogation.  Continue eliquis 5 mg twice a day. Be careful with falls 4. H/o Recurrent syncope  --Suspect cough syncope.  5. ICD  --Nearing ERI. Has f/u with Dr. Caryl Comes for gen change.    Follow up in 3 months. Plan to check BMET at that time.   Glori Bickers  MD 12/23/2016

## 2016-12-23 NOTE — Addendum Note (Signed)
Encounter addended by: Scarlette Calico, RN on: 12/23/2016  9:28 AM<BR>    Actions taken: Medication long-term status modified, Diagnosis association updated, Order list changed, Sign clinical note

## 2016-12-26 ENCOUNTER — Ambulatory Visit (INDEPENDENT_AMBULATORY_CARE_PROVIDER_SITE_OTHER): Payer: Medicare Other

## 2016-12-26 DIAGNOSIS — Z95810 Presence of automatic (implantable) cardiac defibrillator: Secondary | ICD-10-CM | POA: Diagnosis not present

## 2016-12-26 DIAGNOSIS — I5022 Chronic systolic (congestive) heart failure: Secondary | ICD-10-CM

## 2016-12-29 NOTE — Progress Notes (Signed)
EPIC Encounter for ICM Monitoring  Patient Name: Mason Arnold is a 76 y.o. male Date: 12/29/2016 Primary Care Physican: Binnie Rail, MD Primary Cardiologist:[facility]/ HF clinic [place] Electrophysiologist: Caryl Comes Dry Weight:161.7lb  Bi-V Pacing: 98%        Heart Failure questions reviewed, pt asymptomatic now but was SOB with weight gain when impedance was decreased.  He is trying to keep weight at < 162 lbs as discussed at last office visit with Dr Haroldine Laws.  Thoracic impedance normal but was abnormal suggesting fluid accumulation from 12/15/2016 to 12/23/2016.  Labs: 12/23/2016 Creatinine 1.27, BUN 15, Potassium 4.3, Sodium 137, EGFR 53->60 08/13/2016 Creatinine 1.36, BUN 21, Potassium 4.1, Sodium 135, EGFR 49-57  04/02/2016 Creatinine 1.51, BUN 17, Potassium 4.6, Sodium 136, EGFR 43-50   Recommendations: No changes.  Encouraged to call for fluid symptoms.  Follow-up plan: ICM clinic phone appointment on 01/29/2017.  Copy of ICM check sent to device physician.   3 month ICM trend: 12/26/2016   1 Year ICM trend:      Rosalene Billings, RN 12/29/2016 12:06 PM

## 2016-12-31 ENCOUNTER — Other Ambulatory Visit: Payer: Self-pay | Admitting: Internal Medicine

## 2016-12-31 DIAGNOSIS — I480 Paroxysmal atrial fibrillation: Secondary | ICD-10-CM

## 2017-01-02 ENCOUNTER — Telehealth (HOSPITAL_COMMUNITY): Payer: Self-pay | Admitting: Pharmacist

## 2017-01-02 NOTE — Telephone Encounter (Addendum)
Patient's wife called stating Mason Arnold will be expensive for patient after first 30 day free supply. I have submitted a PA for him through [employer] Part D and enrolled him in [employer] foundation for assistance with copays ($800 toward copay costs).   Member ID: [id number] Group ID: [id number] RxBin ID: [id number] PCN: [id number] Eligibility Start Date: 10/04/2016 Eligibility End Date: 01/01/2018  ADDENDUM:  - No PA required. Relayed [employer] foundation info to [employer]. Patient's wife aware.   Ruta Hinds. Velva Harman, PharmD, BCPS, CPP Clinical Pharmacist Pager: [phone] Phone: [phone] 01/02/2017 11:45 AM

## 2017-01-06 ENCOUNTER — Telehealth: Payer: Self-pay | Admitting: Cardiology

## 2017-01-06 NOTE — Telephone Encounter (Signed)
Spoke w/ pt and requested that he send a manual transmission b/c his home monitor has not updated in at least 7 days.   

## 2017-01-09 ENCOUNTER — Other Ambulatory Visit (HOSPITAL_COMMUNITY): Payer: Self-pay | Admitting: Pharmacist

## 2017-01-09 MED ORDER — SACUBITRIL-VALSARTAN 24-26 MG PO TABS: 1.0000 | ORAL_TABLET | Freq: Two times a day (BID) | ORAL | 11 refills | Status: DC

## 2017-01-14 ENCOUNTER — Other Ambulatory Visit: Payer: Self-pay | Admitting: Internal Medicine

## 2017-01-19 ENCOUNTER — Telehealth: Payer: Self-pay | Admitting: Cardiology

## 2017-01-19 NOTE — Telephone Encounter (Signed)
Spoke w/ pt and requested that he send a manual transmission b/c his home monitor has not updated in at least 7 days.   

## 2017-01-29 ENCOUNTER — Telehealth: Payer: Self-pay | Admitting: Cardiology

## 2017-01-29 ENCOUNTER — Ambulatory Visit (INDEPENDENT_AMBULATORY_CARE_PROVIDER_SITE_OTHER): Payer: Medicare Other | Admitting: *Deleted

## 2017-01-29 DIAGNOSIS — Z95810 Presence of automatic (implantable) cardiac defibrillator: Secondary | ICD-10-CM | POA: Diagnosis not present

## 2017-01-29 DIAGNOSIS — I255 Ischemic cardiomyopathy: Secondary | ICD-10-CM

## 2017-01-29 DIAGNOSIS — I5022 Chronic systolic (congestive) heart failure: Secondary | ICD-10-CM

## 2017-01-29 NOTE — Telephone Encounter (Signed)
Confirmed remote transmission w/ pt.   

## 2017-01-29 NOTE — Progress Notes (Signed)
Remote ICD transmission.   

## 2017-01-30 ENCOUNTER — Encounter: Payer: Self-pay | Admitting: Cardiology

## 2017-01-30 LAB — CUP PACEART REMOTE DEVICE CHECK
Battery Voltage: 2.65 V
Brady Statistic AP VS Percent: 1 %
Brady Statistic AS VP Percent: 14 %
Brady Statistic RA Percent Paced: 82 %
Date Time Interrogation Session: 20180222134950
HIGH POWER IMPEDANCE MEASURED VALUE: 47 Ohm
Implantable Lead Implant Date: 20140109
Implantable Lead Model: 148
Implantable Lead Model: 5076
Implantable Pulse Generator Implant Date: 20101013
Lead Channel Impedance Value: 660 Ohm
Lead Channel Pacing Threshold Amplitude: 1 V
Lead Channel Pacing Threshold Amplitude: 2.75 V
Lead Channel Pacing Threshold Pulse Width: 0.5 ms
Lead Channel Pacing Threshold Pulse Width: 0.8 ms
Lead Channel Setting Pacing Pulse Width: 0.8 ms
Lead Channel Setting Sensing Sensitivity: 0.5 mV
MDC IDC LEAD IMPLANT DT: 20020513
MDC IDC LEAD IMPLANT DT: 20020513
MDC IDC LEAD LOCATION: 753858
MDC IDC LEAD LOCATION: 753859
MDC IDC LEAD LOCATION: 753860
MDC IDC LEAD SERIAL: 117908
MDC IDC MSMT BATTERY REMAINING LONGEVITY: 5 mo
MDC IDC MSMT BATTERY REMAINING PERCENTAGE: 7 %
MDC IDC MSMT LEADCHNL LV IMPEDANCE VALUE: 530 Ohm
MDC IDC MSMT LEADCHNL RA IMPEDANCE VALUE: 510 Ohm
MDC IDC MSMT LEADCHNL RA PACING THRESHOLD AMPLITUDE: 0.75 V
MDC IDC MSMT LEADCHNL RA PACING THRESHOLD PULSEWIDTH: 0.4 ms
MDC IDC MSMT LEADCHNL RA SENSING INTR AMPL: 4.7 mV
MDC IDC MSMT LEADCHNL RV SENSING INTR AMPL: 12 mV
MDC IDC PG SERIAL: 733092
MDC IDC SET LEADCHNL LV PACING AMPLITUDE: 3.75 V
MDC IDC SET LEADCHNL LV PACING PULSEWIDTH: 0.5 ms
MDC IDC SET LEADCHNL RA PACING AMPLITUDE: 2 V
MDC IDC SET LEADCHNL RV PACING AMPLITUDE: 2.5 V
MDC IDC STAT BRADY AP VP PERCENT: 84 %
MDC IDC STAT BRADY AS VS PERCENT: 1 %

## 2017-01-30 NOTE — Progress Notes (Signed)
EPIC Encounter for ICM Monitoring  Patient Name: Mason Arnold is a 76 y.o. male Date: 01/30/2017 Primary Care Physican: Binnie Rail, MD Primary Cardiologist:[facility]/ HF clinic [place] Electrophysiologist: Caryl Comes Dry Weight:161.7lb  Bi-V Pacing: 98%      Heart Failure questions reviewed, pt asymptomatic but does have occasional weight gain.   Thoracic impedance trending in peaks above baseline about once a week in the past month and patient reported taking extra 1/2 tablet (40 mg total) of Furosemide when he has weight gain.   Prescribed: Furosemide 80 mg bid and Potassium 20 mEq 1 tablet daily.    Labs: 12/23/2016 Creatinine 1.27, BUN 15, Potassium 4.3, Sodium 137, EGFR 53->60 08/13/2016 Creatinine 1.36, BUN 21, Potassium 4.1, Sodium 135, EGFR 49-57  04/02/2016 Creatinine 1.51, BUN 17, Potassium 4.6, Sodium 136, EGFR 43-50   Recommendations: No changes. Reminded to limit dietary salt intake to 2000 mg/day and fluid intake to < 2 liters/day. Encouraged to call for fluid symptoms.  Follow-up plan: ICM clinic phone appointment on 03/17/2017 and office appointment with HF clinic/Dr Bensimhon 02/27/2017 with fluid level check.  Copy of ICM check sent to device physician.   3 month ICM trend: 01/29/2017   1 Year ICM trend:      Rosalene Billings, RN 01/30/2017 11:55 AM

## 2017-02-13 ENCOUNTER — Encounter: Payer: Self-pay | Admitting: Cardiology

## 2017-02-13 ENCOUNTER — Telehealth: Payer: Self-pay | Admitting: Cardiology

## 2017-02-13 NOTE — Telephone Encounter (Signed)
Spoke w/ pt and requested that he send a manual transmission b/c his home monitor has not updated in at least 7 days.   

## 2017-02-25 ENCOUNTER — Encounter (HOSPITAL_COMMUNITY): Payer: Medicare Other | Admitting: Internal Medicine

## 2017-02-27 ENCOUNTER — Ambulatory Visit (HOSPITAL_COMMUNITY)
Admission: RE | Admit: 2017-02-27 | Discharge: 2017-02-27 | Disposition: A | Discharge status: Home / Self Care | Payer: Medicare Other | Source: Ambulatory Visit | Attending: Internal Medicine | Admitting: Internal Medicine

## 2017-02-27 ENCOUNTER — Encounter (HOSPITAL_COMMUNITY): Payer: Self-pay | Admitting: Internal Medicine

## 2017-02-27 VITALS — BP 122/80 | HR 84 | Wt 167.0 lb

## 2017-02-27 DIAGNOSIS — E1122 Type 2 diabetes mellitus with diabetic chronic kidney disease: Secondary | ICD-10-CM | POA: Diagnosis not present

## 2017-02-27 DIAGNOSIS — I5022 Chronic systolic (congestive) heart failure: Secondary | ICD-10-CM | POA: Diagnosis not present

## 2017-02-27 DIAGNOSIS — E785 Hyperlipidemia, unspecified: Secondary | ICD-10-CM | POA: Diagnosis not present

## 2017-02-27 DIAGNOSIS — Z951 Presence of aortocoronary bypass graft: Secondary | ICD-10-CM | POA: Diagnosis not present

## 2017-02-27 DIAGNOSIS — I2510 Atherosclerotic heart disease of native coronary artery without angina pectoris: Secondary | ICD-10-CM | POA: Diagnosis not present

## 2017-02-27 DIAGNOSIS — I255 Ischemic cardiomyopathy: Secondary | ICD-10-CM | POA: Insufficient documentation

## 2017-02-27 DIAGNOSIS — I480 Paroxysmal atrial fibrillation: Secondary | ICD-10-CM | POA: Insufficient documentation

## 2017-02-27 DIAGNOSIS — Z794 Long term (current) use of insulin: Secondary | ICD-10-CM | POA: Insufficient documentation

## 2017-02-27 DIAGNOSIS — Z95810 Presence of automatic (implantable) cardiac defibrillator: Secondary | ICD-10-CM

## 2017-02-27 DIAGNOSIS — Z79899 Other long term (current) drug therapy: Secondary | ICD-10-CM | POA: Insufficient documentation

## 2017-02-27 DIAGNOSIS — Z7901 Long term (current) use of anticoagulants: Secondary | ICD-10-CM | POA: Diagnosis not present

## 2017-02-27 DIAGNOSIS — M109 Gout, unspecified: Secondary | ICD-10-CM | POA: Diagnosis not present

## 2017-02-27 DIAGNOSIS — N189 Chronic kidney disease, unspecified: Secondary | ICD-10-CM | POA: Diagnosis not present

## 2017-02-27 DIAGNOSIS — I130 Hypertensive heart and chronic kidney disease with heart failure and stage 1 through stage 4 chronic kidney disease, or unspecified chronic kidney disease: Secondary | ICD-10-CM | POA: Diagnosis not present

## 2017-02-27 LAB — BASIC METABOLIC PANEL
ANION GAP: 12 (ref 5–15)
BUN: 17 mg/dL (ref 6–20)
CALCIUM: 8.8 mg/dL — AB (ref 8.9–10.3)
CHLORIDE: 96 mmol/L — AB (ref 101–111)
CO2: 28 mmol/L (ref 22–32)
Creatinine, Ser: 1.63 mg/dL — ABNORMAL HIGH (ref 0.61–1.24)
GFR calc non Af Amer: 39 mL/min — ABNORMAL LOW (ref >60)
GFR, EST AFRICAN AMERICAN: 45 mL/min — AB (ref >60)
GLUCOSE: 175 mg/dL — AB (ref 65–99)
POTASSIUM: 3.4 mmol/L — AB (ref 3.5–5.1)
Sodium: 136 mmol/L (ref 135–145)

## 2017-02-27 LAB — BRAIN NATRIURETIC PEPTIDE: B Natriuretic Peptide: 371 pg/mL — ABNORMAL HIGH (ref 0.0–100.0)

## 2017-02-27 MED ORDER — SACUBITRIL-VALSARTAN 49-51 MG PO TABS: 1.0000 | ORAL_TABLET | Freq: Two times a day (BID) | ORAL | 6 refills | Status: DC

## 2017-02-27 NOTE — Progress Notes (Signed)
Patient ID: Mason Arnold, male   DOB: 19-Dec-1940, 76 y.o.   MRN: [id number]   [facility] CLINIC NOTE PCP: Dr. Linna Darner Primary cardiologist: Dr. Stanford Breed EP: Dr Caryl Comes  Mason Arnold is a 76  yo with history of CAD s/p CABG 2000, ischemic cardiomyopathy (20%) with [employer] CRT-D, DM2, paroxysmal atrial fibrillation, and recurrent syncope of uncertain etiology.    He had RHC/LHC in 4/15 showing patent grafts but elevated filling pressures and low cardiac index (1.9 Fick/2.2 thermo).   Follow up for Heart Failure: He returns for HF follow up with his wife. Overall feels ok. Drinks a lot of water and weight does fluctuate. Weight fluctuates 159-165. Takes lasix 80 bid and about 1x/week takes metolazone when weight gets 163. Gets SOB with mild to moderate exertion. Denies PND/Orthopnea/CP. Previouly switched to Entresto and BP stable 110-120 Taking all medications.  s ICD interrogated personally. No VT/AF  Fluid up and down. Slightly up today. Nearing ERI -4 month  Labs 10/17: K 4.7 Cr 1.5  ECG: AV paced 60  ECHO 04/16/2016: EF 22% IVC normal.  Echo 8/15: EF 15-20% RV moderate dysfunction.   Bliss 04/2016 RA = 7 RV = 48/2/8 PA = 54/20 (33) PCW = 19 Fick cardiac output/index = 4.2/2.4 PVR = 3.3 WU Ao sat = 93% PA sat = 57%, 60%  Last cath 4/15 RA = 9 RV = 69/0/13 PA = 61/24 (38) PCW = 29 Fick cardiac output/index = 3.4/1.9 Thermo CO/CI = 4.1 /2.2 PVR = 2.7 Woods FA sat = 94% PA sat = 57%, 57% Ao Pressure: 104/58 (77) LV Pressure: 103/13/29  Left main: Mild plaque LAD: Totally occluded ostially LCX: Small ramus with high-grade ostial disease. AV groove LCX appears occluded in mid section. OM-1 occluded proximally. RCA: Unable to be cannulated. Suspect occluded ostially.  LV-gram done in the RAO projection: Ejection fraction = 15%. Basal inferior and anterior walls only sections that contract.  LIMA to LAD: Patent SVG to Ramus: Patent SVG to OM-3: Patent SVG to R PDA: Patent.  The RCA is occluded prior to take-off of PDA. There are collaterals from distal RCA to OM-1 and probably a septal perforator.  CPX 04/24/14: FVC 2.70 (78%)  FEV1 2.15 (86%)  FEV1/FVC 80%  MVV 84 (80%) Resting HR: 65 Peak HR: 134 (92% age predicted max HR) Peak VO2: 19.6 (76.1% predicted peak VO2) VE/VCO2 slope: 34.8 OUES: 1.58 Peak RER: 1.23 Ventilatory Threshold: 15.0 (58.3% predicted peak VO2) Peak RR 46 Peak Ventilation: 68.1 VE/MVV: 81% PETCO2 at peak: 30 O2pulse: 11 (76% predicted O2pulse)  Labs (4/15): K 4.7, creatinine 1.4 Labs (5/15): K 3.7 Cr 1.56 pBNP 1,353 Labs (8/15): K 4.8, creatinine 1.6, pBNP 724 Labs (11/13/2014): K 3.6 Creatinine 1.79 dig level 0.5   PMH: 1. HTN 2. Hyperlipidemia 3. CAD: s/p CABG with LIMA-LAD, SVG-ramus, SVG-OM3, SVG-PDA.  LHC (4/15) with patent grafts.  4. Ischemic cardiomyopathy: Echo (8/14) with EF 20-25% with regional WMAs, mild MR, mildly decreased RV systolic function.  EF LV-gram (4/15) was 15%. He has a [employer] officer, political party CRT-D device (upgraded 1/14).  RHC (4/15): mean RA 9, PA 61/24 (mean 38), mean PCWP 29, CI 1.9 Fick/2.2 thermo, PVR 2.7. CPX (04/2014): Peak VO2: 19.6 (76% predicted peak VO2), VE/VCO2: 34.8, Peak RER 1.23.  Echo (10/15) with EF 20-25% with regional wall motion abnormalities, normal RV size with mild to moderately decreased systolic function, mild AS, mild MR.  5. Syncopal episodes: Relatively frequent, etiology uncertain.  He does get lightheaded/near-syncopal with  coughing spells, but he has syncope at other times also with no warning.  Since device has been in place, he has had VT detected but it has not been clearly correlated with syncopal episodes. Suspect vasomotor.  6. Atrial fibrillation: Paroxysmal.  Has not been anticoagulated due to relatively frequent syncope/falls.  7. Gout 8. H/o LBBB 9. Type II diabetes 10. CKD  SH: Married, lives in [place], nonsmoker, rare ETOH.   FH: CAD  ROS: All systems reviewed and  negative except as per HPI.   Current Outpatient Prescriptions  Medication Sig Dispense Refill  . acetaminophen (TYLENOL) 500 MG tablet Take 500 mg by mouth every 6 (six) hours as needed. For pain    . albuterol (PROVENTIL HFA;VENTOLIN HFA) 108 (90 Base) MCG/ACT inhaler Inhale 2 puffs into the lungs every 4 (four) hours as needed for wheezing. 1 Inhaler 6  . allopurinol (ZYLOPRIM) 100 MG tablet Take 1 tablet (100 mg total) by mouth daily. 30 tablet 2  . calcium carbonate (OS-CAL) 600 MG TABS Take 600 mg by mouth daily.      . carvedilol (COREG) 3.125 MG tablet TAKE 1 TABLET BY MOUTH TWICE DAILY WITH MEALS 60 tablet 3  . colchicine 0.6 MG tablet Take 0.6 mg by mouth daily as needed (gouty flare).    Marland Kitchen digoxin (LANOXIN) 0.125 MG tablet Take 0.0625 mg by mouth daily.    Marland Kitchen ELIQUIS 5 MG TABS tablet TAKE 1 TABLET BY MOUTH TWICE A DAY 60 tablet 3  . famotidine (PEPCID) 10 MG tablet Take 10 mg by mouth daily.     Marland Kitchen FLUoxetine (PROZAC) 20 MG capsule TAKE 1 CAPSULE (20 MG TOTAL) BY MOUTH DAILY. 90 capsule 1  . furosemide (LASIX) 80 MG tablet Take 1 tablet (80 mg total) by mouth 2 (two) times daily. 60 tablet 6  . insulin glargine (LANTUS) 100 UNIT/ML injection Inject 45 Units into the skin at bedtime. Sliding scale    . loratadine (CLARITIN) 10 MG tablet Take 10 mg by mouth daily as needed. For allergies    . Multiple Vitamin (MULTIVITAMIN) tablet Take 1 tablet by mouth daily.     Marland Kitchen omega-3 acid ethyl esters (LOVAZA) 1 G capsule Take 2 g by mouth 2 (two) times daily.      . potassium chloride SA (K-DUR,KLOR-CON) 20 MEQ tablet Take 1 tablet (20 mEq total) by mouth daily. 90 tablet 3  . sacubitril-valsartan (ENTRESTO) 24-26 MG Take 1 tablet by mouth 2 (two) times daily. 60 tablet 11  . spironolactone (ALDACTONE) 25 MG tablet Take 25 mg by mouth daily.      . tamsulosin (FLOMAX) 0.4 MG CAPS capsule Take 0.4 mg by mouth daily after supper.    . metolazone (ZAROXOLYN) 2.5 MG tablet Take 1 tablet (2.5 mg  total) by mouth daily as needed. For fluid or edema (Patient not taking: Reported on 12/23/2016) 30 tablet 3   No current facility-administered medications for this encounter.     BP 122/80 (BP Location: Left Arm, Patient Position: Sitting, Cuff Size: Normal)   Pulse 84   Wt 167 lb (75.8 kg)   SpO2 99%   BMI 27.79 kg/m  General: NAD wife present . Ambulated in the clinic without difficulty.  Neck: JVP 7-8 no thyromegaly or thyroid nodule.  Lungs: Clear to auscultation bilaterally with normal respiratory effort. No wheezing CV: Nondisplaced PMI.  Heart regular S1/S2, no S3/S4, no murmur. No carotid bruits Abdomen: Soft, nontender, no hepatosplenomegaly, nontender Skin: Intact without lesions or rashes.  Neurologic: Alert and oriented x 3. Cranial nerves ok. Moves all 4 without problem Psych: Normal affect. Extremities: No clubbing or cyanosis. No edema No rash  Assessment/Plan: 1. Chronic systolic CHF: Ischemic cardiomyopathy s/p St Jude CRT-D, EF 20%(04/2016 )  -NYHA II-III -Volume status up and down by history and Corevue. When he is 160-161 pounds he is fine when he is 164 or greater he gets symptoms.  - Will continue lasix 80 mg BID and continue metolazone 2.5 as needed for weight 162 bs or greater. - Increase Entresto to 49/51. Labs today - Continue spironolactone, digoxin and Coreg. Dig level 03/2016 0.2 . Recheck labs today - Reinforced the need and importance of daily weights, a low sodium diet, and fluid restriction (less than 2 L a day). Instructed to call the HF clinic if weight increases more than 3 lbs overnight or 5 lbs in a week.  2. CAD:  - Doing well no ischemic symptoms -Patent grafts on LHC in 4/15. Continue statin.  With Eliquis so no longer taking aspirin. Can drop dose to 2.5 bid when he turns 79    3. Paroxysmal atrial fibrillation:  --ICD interrogated No PAF --Continue eliquis 5 mg twice a day.  4. H/o Recurrent syncope  --Suspect cough syncope. Now stable 5.  ICD  --Nearing ERI. 4 months left. Has f/u with Dr. Caryl Comes for gen change.    Glori Bickers MD 02/27/2017

## 2017-02-27 NOTE — Patient Instructions (Signed)
Increase Entresto to 49/51 mg Twice daily   Labs today  We will contact you in 4 months to schedule your next appointment.

## 2017-02-27 NOTE — Addendum Note (Signed)
Encounter addended by: Scarlette Calico, RN on: 02/27/2017 11:39 AM<BR>    Actions taken: Diagnosis association updated, Order list changed, Sign clinical note

## 2017-02-27 NOTE — Addendum Note (Signed)
Encounter addended by: Jolaine Artist, MD on: 02/27/2017 11:34 AM<BR>    Actions taken: Charge Capture section accepted, Follow-up modified

## 2017-03-04 ENCOUNTER — Telehealth (HOSPITAL_COMMUNITY): Payer: Self-pay | Admitting: *Deleted

## 2017-03-04 DIAGNOSIS — I5022 Chronic systolic (congestive) heart failure: Secondary | ICD-10-CM

## 2017-03-04 MED ORDER — POTASSIUM CHLORIDE CRYS ER 20 MEQ PO TBCR: 40.0000 meq | EXTENDED_RELEASE_TABLET | Freq: Every day | ORAL | 3 refills | Status: DC

## 2017-03-04 NOTE — Telephone Encounter (Signed)
-----   Message from Jolaine Artist, MD sent at 03/04/2017 12:07 AM EDT ----- Please have him take 40 extra k. And increase daily k by 20. Repeat 2 weeks. thanks

## 2017-03-04 NOTE — Telephone Encounter (Signed)
Notes recorded by Scarlette Calico, RN on 03/04/2017 at 4:44 PM EDT Pt aware, agreeable and verbalizes understanding, repeat labs sch 4/11

## 2017-03-04 NOTE — Addendum Note (Signed)
Addended by: Scarlette Calico on: 03/04/2017 04:48 PM   Modules accepted: Orders

## 2017-03-05 ENCOUNTER — Other Ambulatory Visit (HOSPITAL_COMMUNITY): Payer: Self-pay | Admitting: Student

## 2017-03-06 ENCOUNTER — Telehealth: Payer: Self-pay | Admitting: Cardiology

## 2017-03-06 NOTE — Telephone Encounter (Signed)
Spoke w/ pt and requested that he send a manual transmission b/c his home monitor has not updated in at least 7 days.   

## 2017-03-09 IMAGING — DX DG FOOT COMPLETE 3+V*L*
3 series · 3 of 3 positions shown · non-contrast
Comparison: None.

CLINICAL DATA: Generalized pain for 7 days, fell off ladder, foot
swelling

EXAM:
LEFT FOOT - COMPLETE 3+ VIEW

[foot ap]
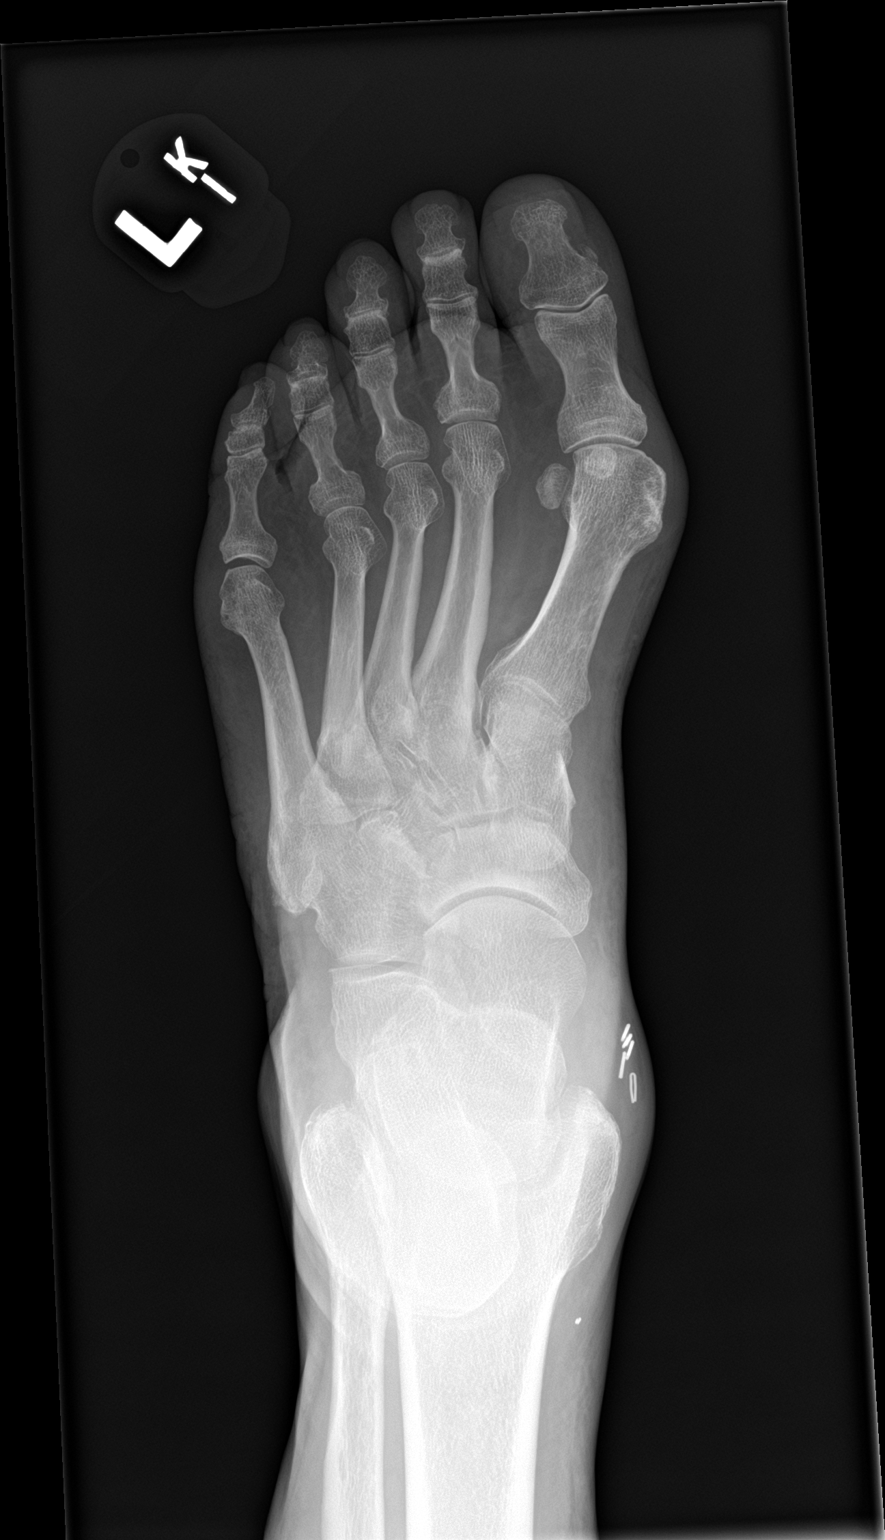

[foot obl]
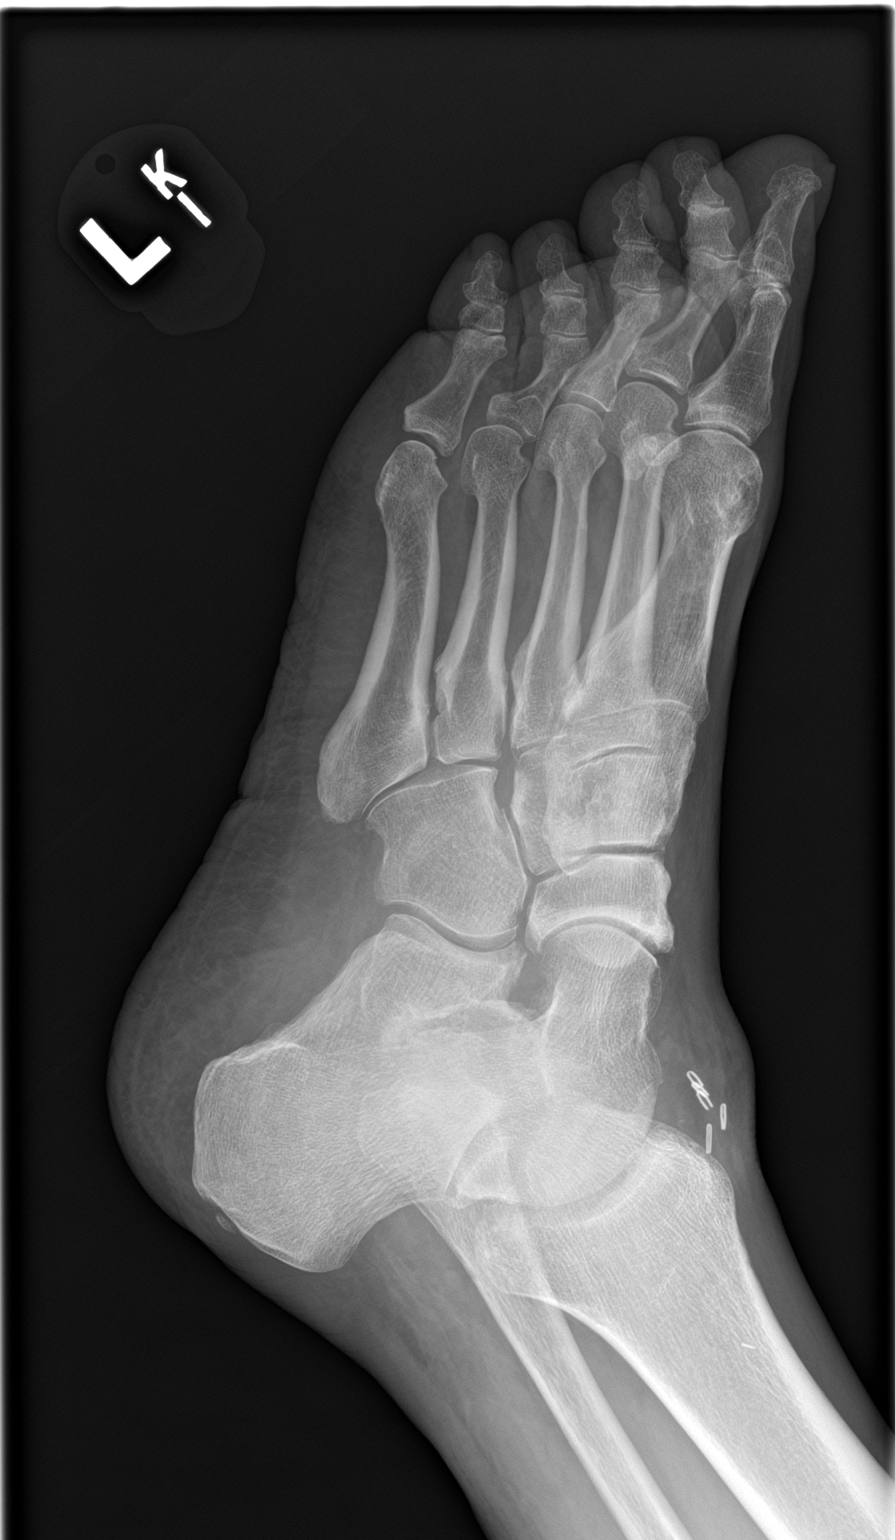

[foot lat]
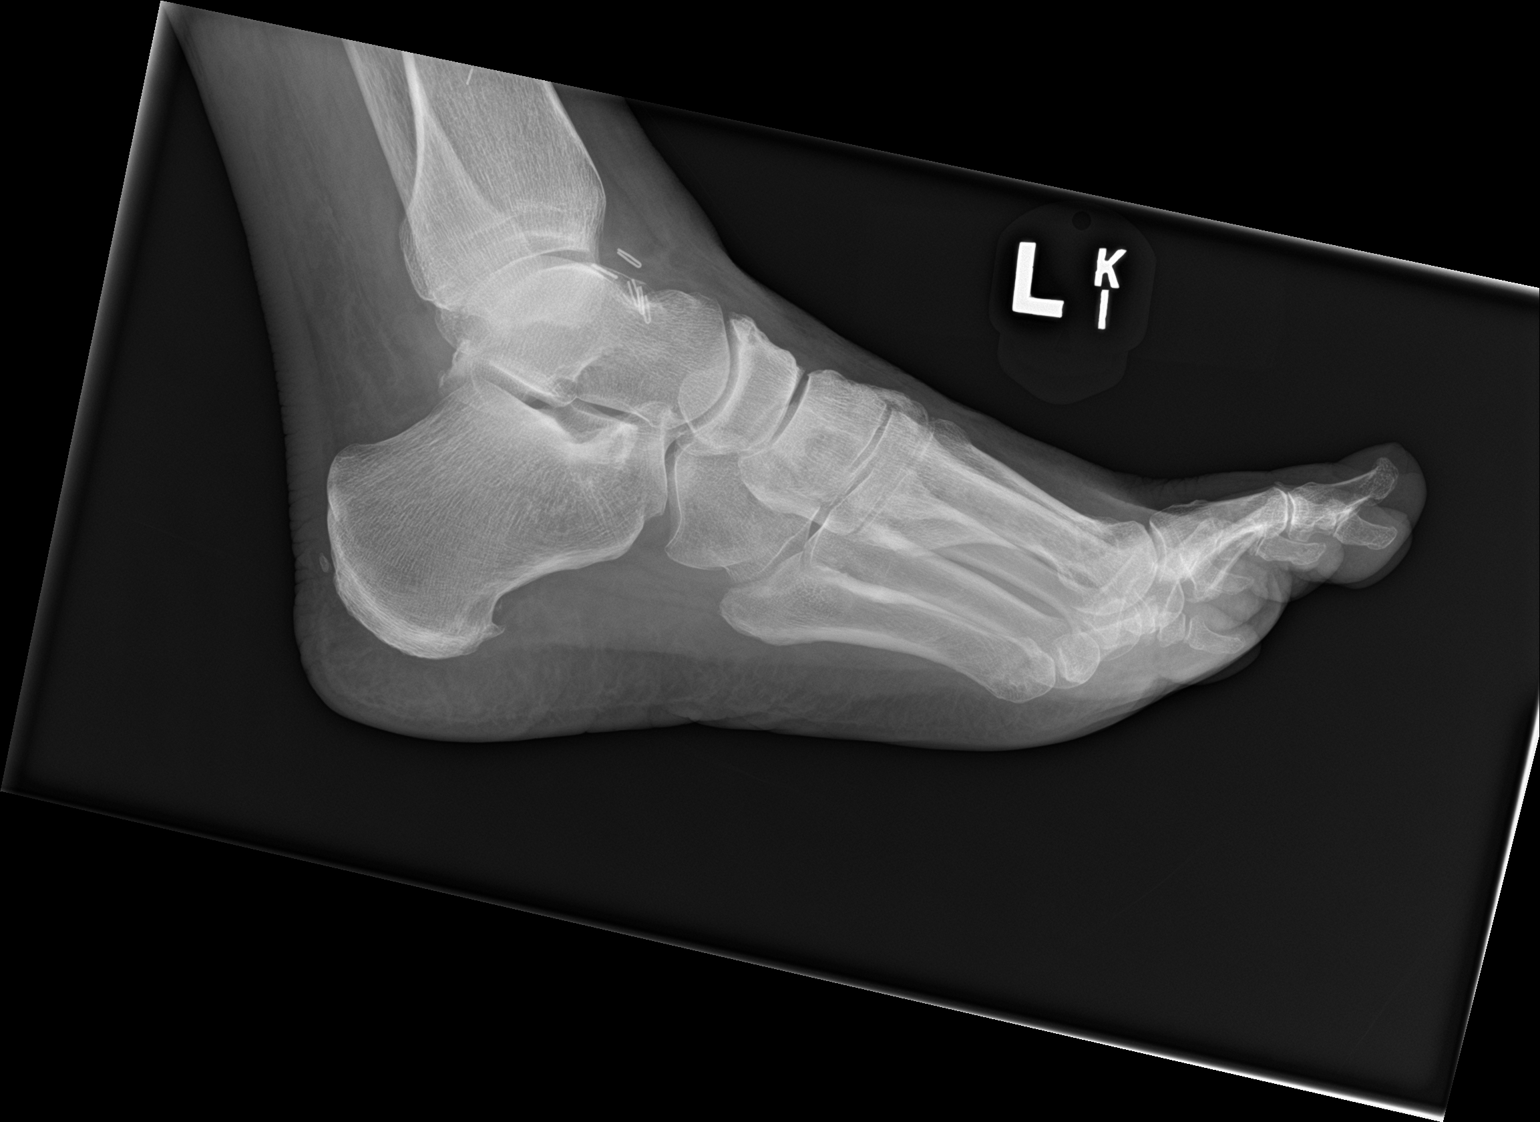

[3 of 3 positions shown; findings below may reference images not displayed]

FINDINGS: Three views of the left foot submitted. No acute fracture or
subluxation. Mild hallux valgus deformity. Mild degenerative changes
first metatarsal phalangeal joint. Surgical clips are noted adjacent
to medial malleolus.
IMPRESSION: No acute fracture or subluxation. Mild hallux valgus deformity. Mild
degenerative changes first metatarsal phalangeal joint.

## 2017-03-09 IMAGING — DX DG ANKLE COMPLETE 3+V*L*
3 series · 3 of 3 positions shown · non-contrast
Comparison: None.

CLINICAL DATA: Pain x7 days post fall

EXAM:
LEFT ANKLE COMPLETE - 3+ VIEW

[ankle ap]
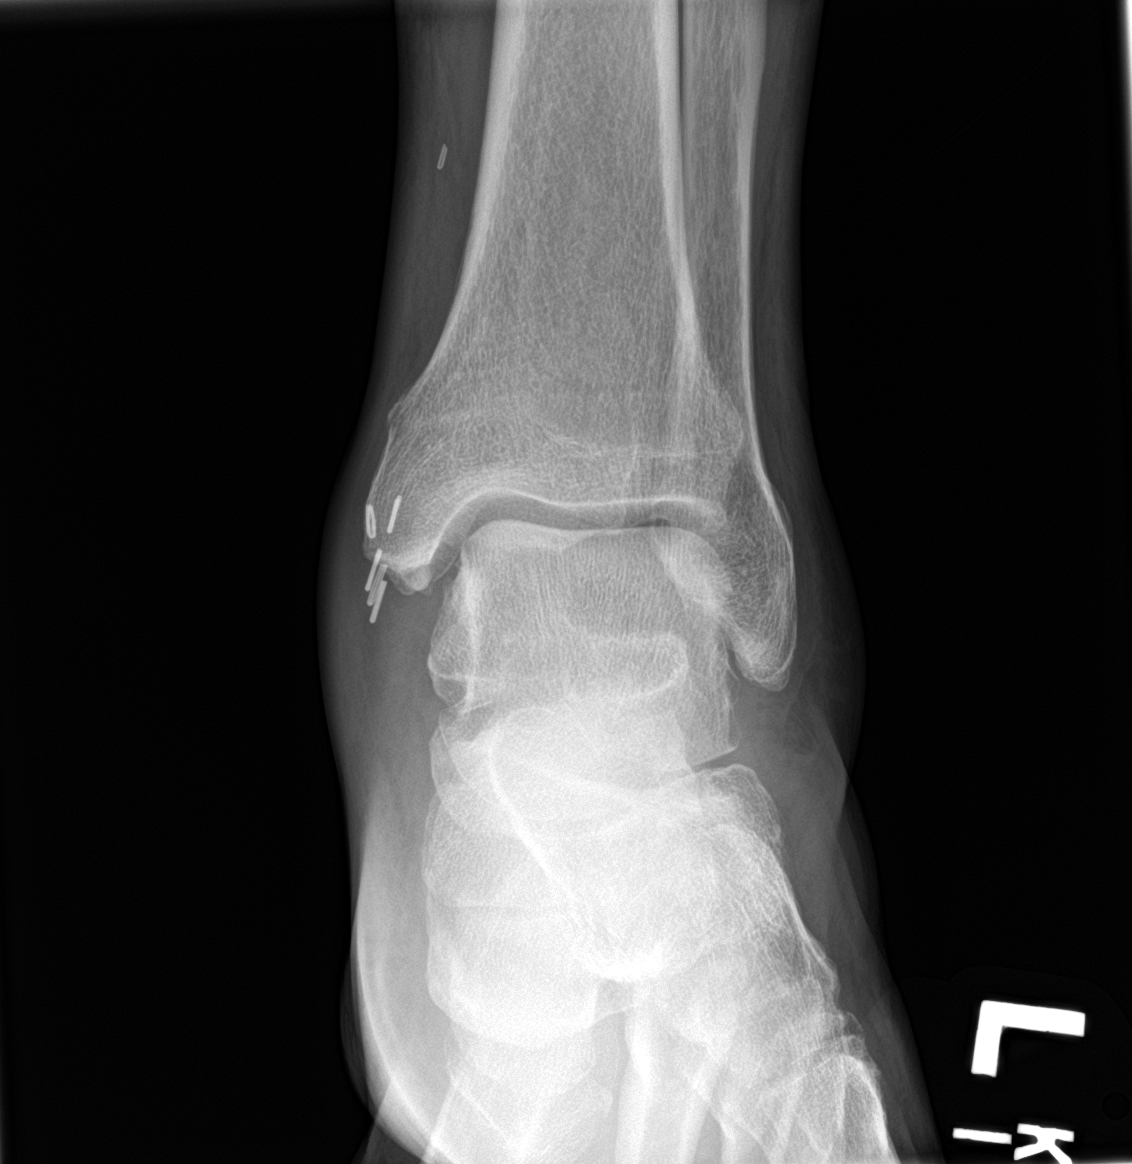

[ankle obl]
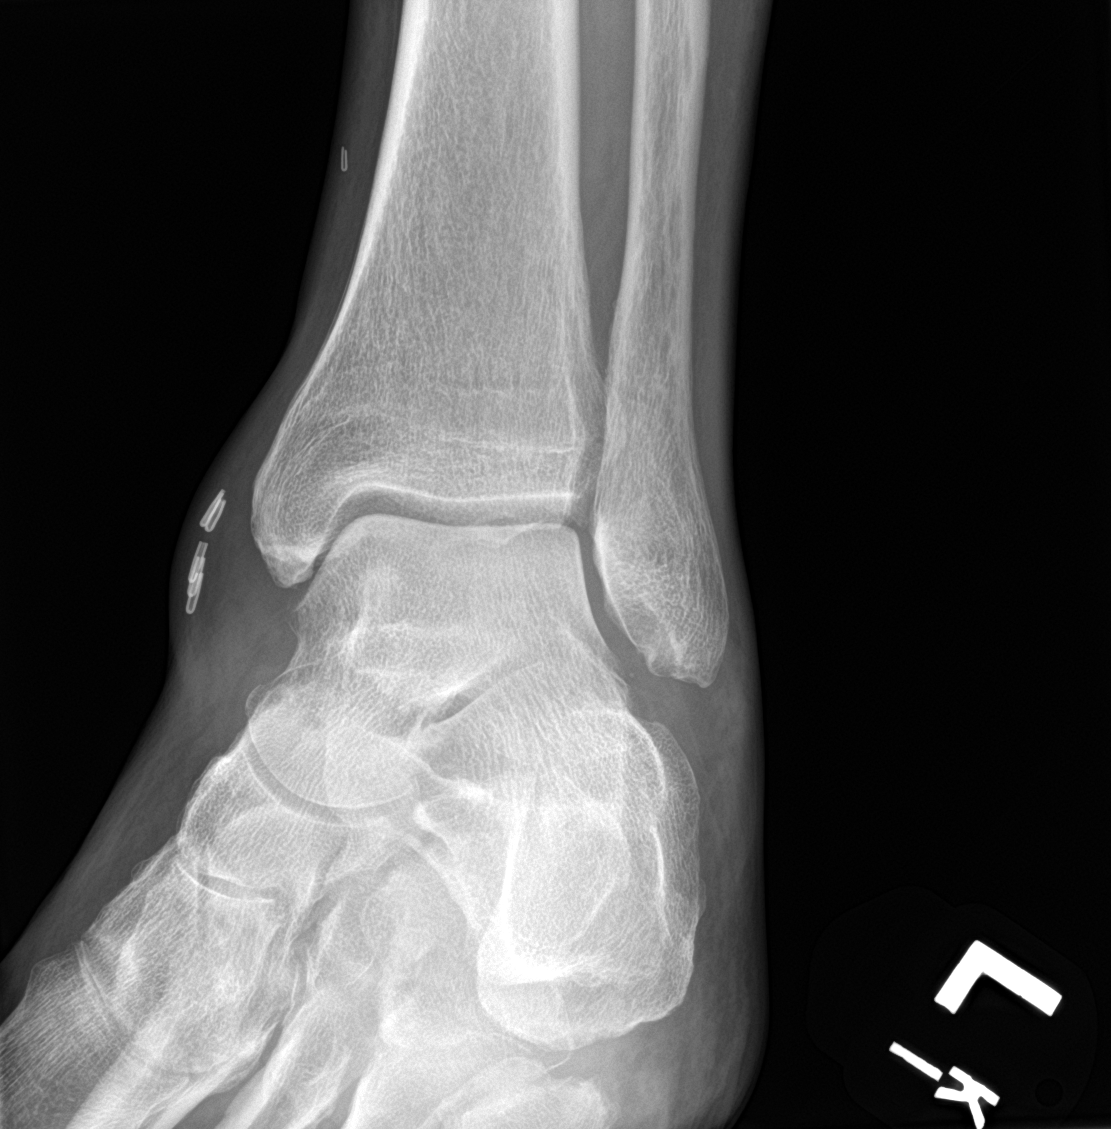

[ankle lat]
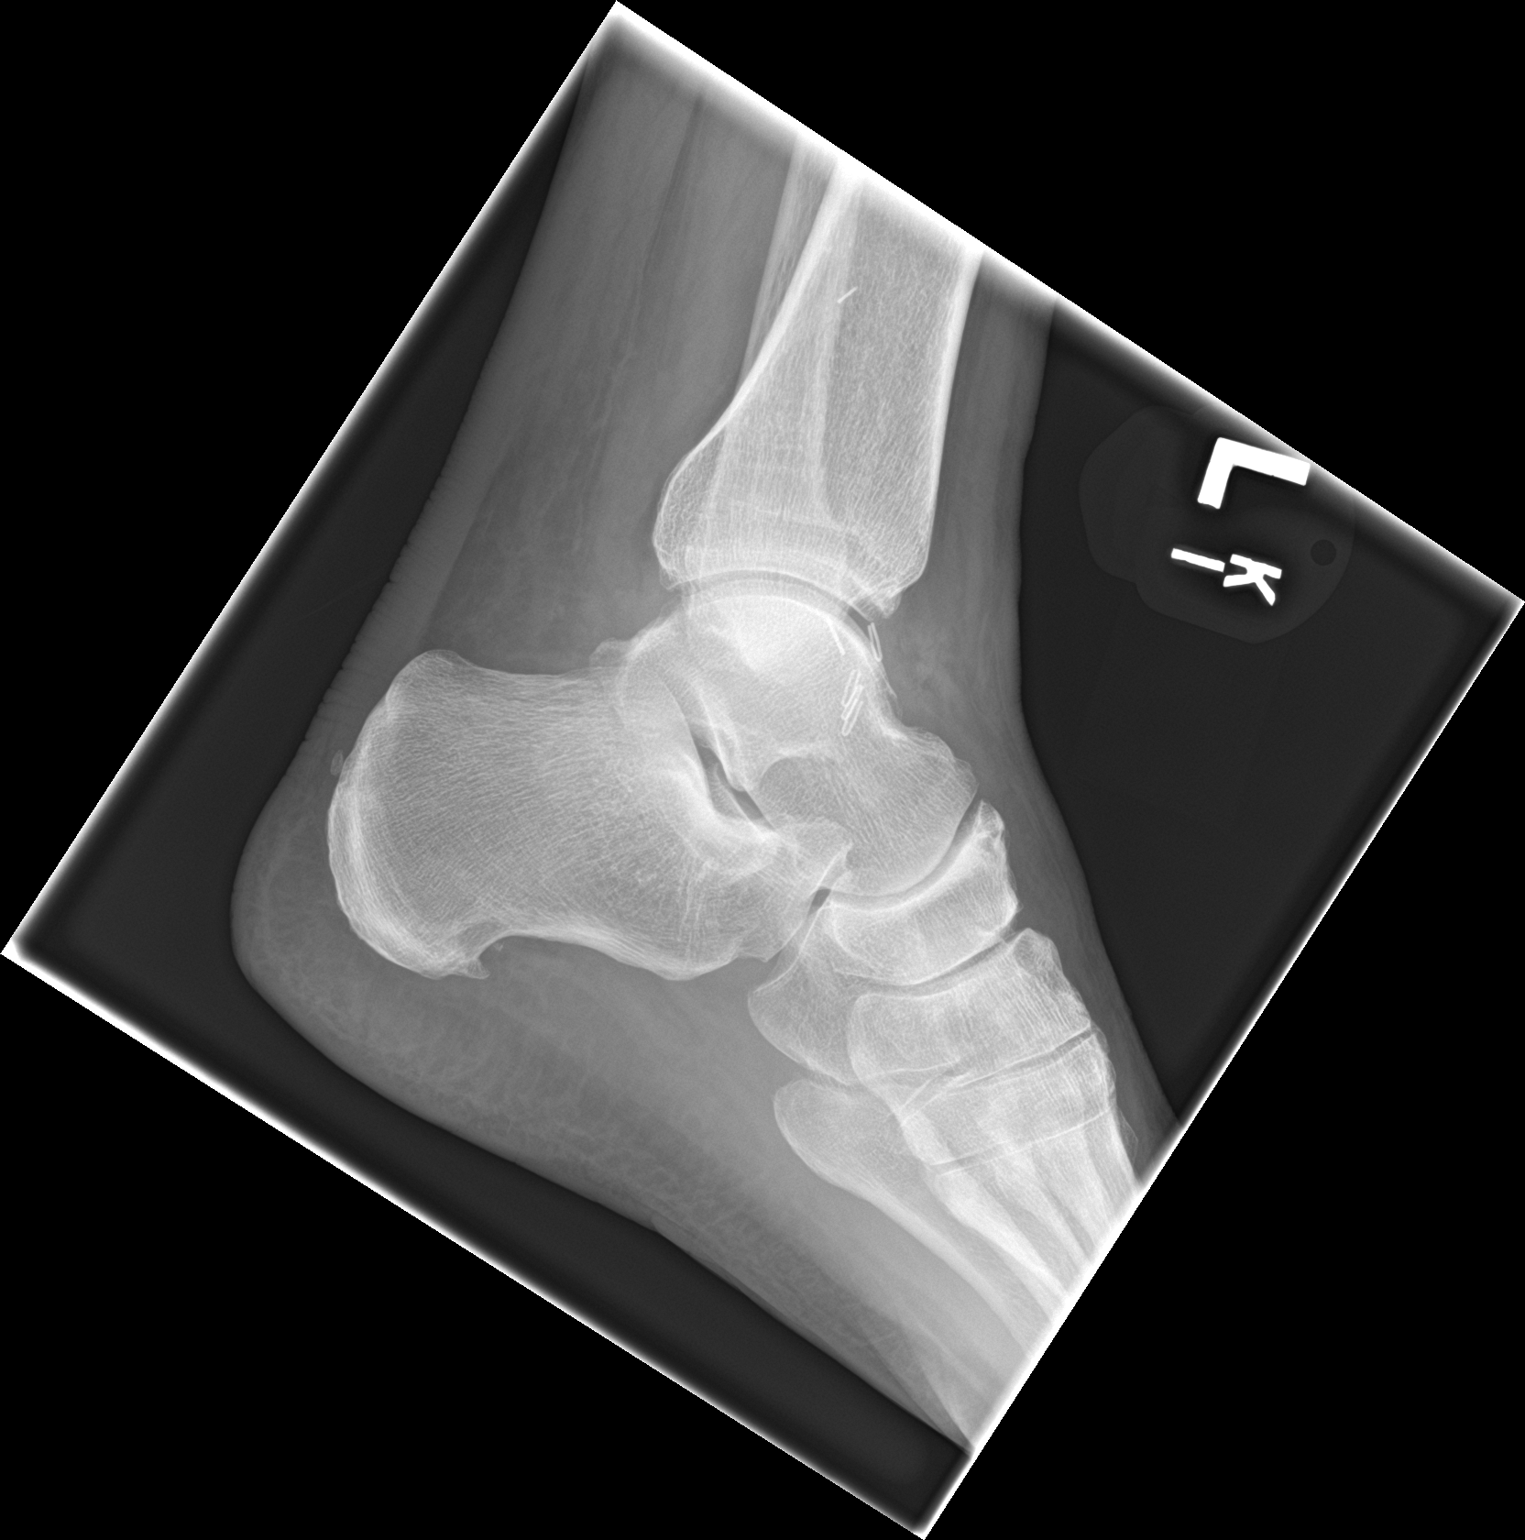

[3 of 3 positions shown; findings below may reference images not displayed]

FINDINGS: There is no evidence of fracture, dislocation, or joint effusion.
Small calcaneal spur. There is no evidence of arthropathy or other
focal bone abnormality. Surgical clips medial to the ankle. Soft
tissues are unremarkable.
IMPRESSION: 1. No fracture or other acute bone abnormality

## 2017-03-17 ENCOUNTER — Telehealth: Payer: Self-pay | Admitting: Cardiology

## 2017-03-17 NOTE — Progress Notes (Signed)
No ICM remote transmission received for 03/17/2017 and next ICM transmission scheduled for 03/30/2017.

## 2017-03-17 NOTE — Telephone Encounter (Signed)
LMOVM reminding pt to send remote transmission.   

## 2017-03-18 ENCOUNTER — Ambulatory Visit (HOSPITAL_COMMUNITY)
Admission: RE | Admit: 2017-03-18 | Discharge: 2017-03-18 | Disposition: A | Discharge status: Home / Self Care | Payer: Medicare Other | Source: Ambulatory Visit | Attending: Cardiology | Admitting: Cardiology

## 2017-03-18 DIAGNOSIS — I5022 Chronic systolic (congestive) heart failure: Secondary | ICD-10-CM | POA: Insufficient documentation

## 2017-03-18 LAB — BASIC METABOLIC PANEL
Anion gap: 10 (ref 5–15)
BUN: 23 mg/dL — AB (ref 6–20)
CHLORIDE: 93 mmol/L — AB (ref 101–111)
CO2: 32 mmol/L (ref 22–32)
CREATININE: 1.52 mg/dL — AB (ref 0.61–1.24)
Calcium: 9.3 mg/dL (ref 8.9–10.3)
GFR calc Af Amer: 49 mL/min — ABNORMAL LOW (ref >60)
GFR, EST NON AFRICAN AMERICAN: 42 mL/min — AB (ref >60)
Glucose, Bld: 199 mg/dL — ABNORMAL HIGH (ref 65–99)
Potassium: 4.2 mmol/L (ref 3.5–5.1)
SODIUM: 135 mmol/L (ref 135–145)

## 2017-03-27 ENCOUNTER — Other Ambulatory Visit: Payer: Self-pay | Admitting: Internal Medicine

## 2017-03-30 ENCOUNTER — Telehealth: Payer: Self-pay | Admitting: Cardiology

## 2017-03-30 NOTE — Telephone Encounter (Signed)
Spoke with pt and reminded pt of remote transmission that is due today. Pt verbalized understanding.   

## 2017-03-31 NOTE — Progress Notes (Signed)
No ICM remote transmission received for 03/30/2017 and next ICM transmission scheduled for 04/30/2017.

## 2017-04-30 ENCOUNTER — Ambulatory Visit (INDEPENDENT_AMBULATORY_CARE_PROVIDER_SITE_OTHER): Payer: Medicare Other | Admitting: *Deleted

## 2017-04-30 DIAGNOSIS — Z95810 Presence of automatic (implantable) cardiac defibrillator: Secondary | ICD-10-CM | POA: Diagnosis not present

## 2017-04-30 DIAGNOSIS — I255 Ischemic cardiomyopathy: Secondary | ICD-10-CM | POA: Diagnosis not present

## 2017-04-30 DIAGNOSIS — I5022 Chronic systolic (congestive) heart failure: Secondary | ICD-10-CM | POA: Diagnosis not present

## 2017-04-30 LAB — CUP PACEART REMOTE DEVICE CHECK
Brady Statistic AP VS Percent: 1 %
Brady Statistic AS VP Percent: 14 %
Brady Statistic AS VS Percent: 1 %
HighPow Impedance: 48 Ohm
Implantable Lead Implant Date: 20020513
Implantable Lead Implant Date: 20140109
Implantable Lead Location: 753858
Implantable Lead Location: 753859
Implantable Lead Location: 753860
Implantable Lead Model: 148
Implantable Lead Model: 5076
Lead Channel Impedance Value: 530 Ohm
Lead Channel Impedance Value: 690 Ohm
Lead Channel Pacing Threshold Amplitude: 1 V
Lead Channel Pacing Threshold Amplitude: 1.875 V
Lead Channel Pacing Threshold Pulse Width: 0.4 ms
Lead Channel Pacing Threshold Pulse Width: 0.5 ms
Lead Channel Pacing Threshold Pulse Width: 0.8 ms
Lead Channel Sensing Intrinsic Amplitude: 12 mV
Lead Channel Sensing Intrinsic Amplitude: 3.7 mV
Lead Channel Setting Pacing Amplitude: 2.5 V
Lead Channel Setting Pacing Amplitude: 2.5 V
Lead Channel Setting Pacing Amplitude: 2.875
Lead Channel Setting Pacing Pulse Width: 0.8 ms
Lead Channel Setting Sensing Sensitivity: 0.5 mV
MDC IDC LEAD IMPLANT DT: 20020513
MDC IDC LEAD SERIAL: 117908
MDC IDC MSMT BATTERY REMAINING LONGEVITY: 1 mo
MDC IDC MSMT BATTERY REMAINING PERCENTAGE: 3 %
MDC IDC MSMT BATTERY VOLTAGE: 2.62 V
MDC IDC MSMT LEADCHNL LV IMPEDANCE VALUE: 580 Ohm
MDC IDC MSMT LEADCHNL RA PACING THRESHOLD AMPLITUDE: 1.25 V
MDC IDC PG IMPLANT DT: 20101013
MDC IDC PG SERIAL: 733092
MDC IDC SESS DTM: 20180524122203
MDC IDC SET LEADCHNL LV PACING PULSEWIDTH: 0.5 ms
MDC IDC STAT BRADY AP VP PERCENT: 85 %
MDC IDC STAT BRADY RA PERCENT PACED: 83 %

## 2017-04-30 NOTE — Progress Notes (Signed)
Remote ICD transmission.   

## 2017-05-01 ENCOUNTER — Encounter: Payer: Self-pay | Admitting: Cardiology

## 2017-05-01 NOTE — Progress Notes (Signed)
EPIC Encounter for ICM Monitoring  Patient Name: Mason Arnold is a 76 y.o. male Date: 05/01/2017 Primary Care Physican: Burns, Stacy J, MD Primary Cardiologist:Crenshaw/ HF clinic [place] Electrophysiologist: Klein Dry Weight:unknown  Bi-V Pacing: 99%        Heart Failure questions reviewed, pt asymptomatic    Thoracic impedance normal   Prescribed: Furosemide 80 mg bid and Potassium 20 mEq 1 tablet daily. Spironolactone 25 mg 1 tablet daily.  Labs: 03/18/2017 Creatinine 1.52, BUN 23, Potassium 4.2, Sodium 135, EGFR 42-49 02/27/2017 Creatinine 1.63, BUN 17, Potassium 3.4, Sodium 136, EGFR 39-45 12/23/2016 Creatinine 1.27, BUN 15, Potassium 4.3, Sodium 137, EGFR 53->60 08/13/2016 Creatinine 1.36, BUN 21, Potassium 4.1, Sodium 135, EGFR 49-57  04/02/2016 Creatinine 1.51, BUN 17, Potassium 4.6, Sodium 136, EGFR 43-50   Recommendations: No changes. Discussed to limit salt intake to 2000 mg/day and fluid intake to < 2 liters/day.  Encouraged to call for fluid symptoms or use local ER for any urgent symptoms.  Follow-up plan: ICM clinic phone appointment on 06/01/2017 for 31 day follow up and battery check.    Copy of ICM check sent to device physician.   3 month ICM trend: 05/01/2017   1 Year ICM trend:      Laurie S Short, RN 05/01/2017 9:31 AM    

## 2017-05-18 ENCOUNTER — Telehealth: Payer: Self-pay | Admitting: Cardiology

## 2017-05-18 NOTE — Telephone Encounter (Signed)
Spoke w/ pt and requested that he send a manual transmission b/c his home monitor has not updated in at least 7 days.   

## 2017-05-21 ENCOUNTER — Encounter: Payer: Self-pay | Admitting: Internal Medicine

## 2017-05-26 ENCOUNTER — Encounter: Payer: Self-pay | Admitting: Internal Medicine

## 2017-05-31 ENCOUNTER — Telehealth: Payer: Self-pay | Admitting: Physician Assistant

## 2017-05-31 NOTE — Telephone Encounter (Addendum)
Received call from patient's wife that device made a purring-type sound this AM, she believes due to low battery. Denies shock, no new symptoms. Last interrogation 5/24 with device <71mo to ERI. Contacted [employer] device rep who contacted the patient and had them send transmission. Purring sound indicates device has now reached ERI today - no urgent need indicated but the patient will need close f/u to discuss timing of battery replacement. Gaspar Bidding warned the patient that device would likely make that noise a few times over the next couple hours, and advised that IF patient should receive a shock now that he is in ERI this could deplete battery and he would recommend proceeding to the ED. No shocks seen, only normal alarm indicating device at ERI. Gaspar Bidding already asked patient to call office in AM to discuss next steps with EP team and interrogation has been faxed to the office. Will forward to device clinic to help assist with arranging next steps (and provider team as well). Laurin Morgenstern PA-C

## 2017-06-01 ENCOUNTER — Telehealth: Payer: Self-pay | Admitting: Internal Medicine

## 2017-06-01 ENCOUNTER — Telehealth: Payer: Self-pay | Admitting: Cardiology

## 2017-06-01 ENCOUNTER — Ambulatory Visit (INDEPENDENT_AMBULATORY_CARE_PROVIDER_SITE_OTHER): Payer: Self-pay | Admitting: *Deleted

## 2017-06-01 ENCOUNTER — Ambulatory Visit (INDEPENDENT_AMBULATORY_CARE_PROVIDER_SITE_OTHER): Payer: Medicare Other

## 2017-06-01 DIAGNOSIS — Z95810 Presence of automatic (implantable) cardiac defibrillator: Secondary | ICD-10-CM

## 2017-06-01 DIAGNOSIS — I5022 Chronic systolic (congestive) heart failure: Secondary | ICD-10-CM

## 2017-06-01 NOTE — Telephone Encounter (Signed)
New Message   1. Has your device fired? no  2. Is you device beeping? No   3. Are you experiencing draining or swelling at device site? no  4. Are you calling to see if we received your device transmission? No   5. Have you passed out? No  Pt wife call stating pt was having issues with his device battery. Pt wife states they did call the company and notify them. She states she was told to call the office. Please call back to discuss

## 2017-06-01 NOTE — Progress Notes (Signed)
EPIC Encounter for ICM Monitoring  Patient Name: Mason Arnold is a 76 y.o. male Date: 06/01/2017 Primary Care Physican: Binnie Rail, MD Primary Cardiologist:Crenshaw/ HF clinic [place] Electrophysiologist: Caryl Comes Dry Weight:unknown  Bi-V Pacing: 99%        Heart Failure questions reviewed, pt asymptomatic.   Thoracic impedance normal.  Prescribed: Furosemide 80 mg bid and Potassium 20 mEq 1 tablet daily. Spironolactone 25 mg 1 tablet daily.  Labs: 03/18/2017 Creatinine 1.52, BUN 23, Potassium 4.2, Sodium 135, EGFR 42-49 02/27/2017 Creatinine 1.63, BUN 17, Potassium 3.4, Sodium 136, EGFR 39-45 12/23/2016 Creatinine 1.27, BUN 15, Potassium 4.3, Sodium 137, EGFR 53->60 08/13/2016 Creatinine 1.36, BUN 21, Potassium 4.1, Sodium 135, EGFR 49-57  04/02/2016 Creatinine 1.51, BUN 17, Potassium 4.6, Sodium 136, EGFR 43-50   Recommendations: No changes.  Advised to limit salt intake to 2000 mg/day and fluid intake to < 2 liters/day.  Encouraged to call for fluid symptoms.  Follow-up plan: ICM clinic phone appointment on 07/14/2017.  Appointment with Tommye Standard, PA to discuss battery replacement on 06/11/2017  Copy of ICM check sent to primary cardiologist and device physician.   3 month ICM trend: 06/01/2017   1 Year ICM trend:      Rosalene Billings, RN 06/01/2017 11:14 AM

## 2017-06-01 NOTE — Progress Notes (Signed)
Remote ICD transmission.   

## 2017-06-01 NOTE — Telephone Encounter (Signed)
Spoke w/ pt and informed him that his device has reached ERI. Informed him a scheduler will be in touch to schedule an appt w/ MD / PA / NP to discuss procedure and the procedure will be scheduled at a later date. Pt verbalized understanding.

## 2017-06-02 ENCOUNTER — Other Ambulatory Visit (HOSPITAL_COMMUNITY): Payer: Self-pay | Admitting: Internal Medicine

## 2017-06-04 ENCOUNTER — Encounter: Payer: Self-pay | Admitting: Nurse Practitioner

## 2017-06-04 LAB — CUP PACEART REMOTE DEVICE CHECK
Battery Remaining Longevity: 0 mo
Battery Voltage: 2.59 V
Brady Statistic AP VS Percent: 1 %
Brady Statistic AS VP Percent: 14 %
Brady Statistic RA Percent Paced: 83 %
HIGH POWER IMPEDANCE MEASURED VALUE: 47 Ohm
Implantable Lead Implant Date: 20020513
Implantable Lead Implant Date: 20020513
Implantable Lead Implant Date: 20140109
Implantable Lead Location: 753859
Implantable Lead Model: 148
Implantable Pulse Generator Implant Date: 20101013
Lead Channel Impedance Value: 480 Ohm
Lead Channel Pacing Threshold Amplitude: 1 V
Lead Channel Pacing Threshold Amplitude: 1.25 V
Lead Channel Pacing Threshold Amplitude: 1.875 V
Lead Channel Pacing Threshold Pulse Width: 0.4 ms
Lead Channel Setting Pacing Amplitude: 2.5 V
Lead Channel Setting Pacing Amplitude: 2.875
Lead Channel Setting Pacing Pulse Width: 0.5 ms
Lead Channel Setting Pacing Pulse Width: 0.8 ms
Lead Channel Setting Sensing Sensitivity: 0.5 mV
MDC IDC LEAD LOCATION: 753858
MDC IDC LEAD LOCATION: 753860
MDC IDC LEAD SERIAL: 117908
MDC IDC MSMT LEADCHNL LV IMPEDANCE VALUE: 580 Ohm
MDC IDC MSMT LEADCHNL LV PACING THRESHOLD PULSEWIDTH: 0.5 ms
MDC IDC MSMT LEADCHNL RA SENSING INTR AMPL: 3.9 mV
MDC IDC MSMT LEADCHNL RV IMPEDANCE VALUE: 950 Ohm
MDC IDC MSMT LEADCHNL RV PACING THRESHOLD PULSEWIDTH: 0.8 ms
MDC IDC MSMT LEADCHNL RV SENSING INTR AMPL: 12 mV
MDC IDC PG SERIAL: 733092
MDC IDC SESS DTM: 20180624170134
MDC IDC SET LEADCHNL RV PACING AMPLITUDE: 2.5 V
MDC IDC STAT BRADY AP VP PERCENT: 84 %
MDC IDC STAT BRADY AS VS PERCENT: 1 %

## 2017-06-04 NOTE — Progress Notes (Signed)
Electrophysiology Office Note Date: 06/05/2017  ID:  Mason Arnold, DOB 03-21-1941, MRN [id number]  PCP: Binnie Rail, MD Primary Cardiologist: Stanford Breed Heart Failure: [place] Electrophysiologist: Caryl Comes  CC: ICD at [place] is a 76 y.o. male seen today for Dr Caryl Comes.  He presents today for routine electrophysiology followup.  Since last being seen in our clinic, the patient reports doing reasoanbly well.  He continues with intermittent hypotension and occasional near syncope.  He denies chest pain, palpitations, dyspnea, PND, orthopnea, nausea, vomiting, frank syncope, edema, weight gain, or early satiety.  He has not had ICD shocks.   Device History: BSX dual chamber ICD implanted 2002 for VT; gen change 2/2 advisory recall 2005; gen change 2010 to STJ CRTD without LV lead placement; LV lead placement 2014 History of appropriate therapy: No History of AAD therapy: No   Past Medical History:  Diagnosis Date  . Anginal pain ([place])   . Congestive heart failure ([place])   . Diabetes mellitus type II    IDDM , [facility]  . Gout   . Hyperlipidemia   . Hypertension   . Ischemic heart disease   . Left bundle branch block   . Myocardial infarction ([place]) 2000; 2002; 2004; ~ 2006  . Ventricular tachycardia [facility])    Past Surgical History:  Procedure Laterality Date  . A-V CARDIAC PACEMAKER INSERTION  2002; 2010  . BIV ICD GENERTAOR CHANGE OUT N/A 12/16/2012   Procedure: BIV ICD GENERTAOR CHANGE OUT;  Surgeon: Deboraha Sprang, MD;  Location: [facility] CATH LAB;  Service: Cardiovascular;  Laterality: N/A;  . CARDIAC CATHETERIZATION     "3 or 4; never had interventions" (12/16/2012)  . CARDIAC CATHETERIZATION N/A 04/11/2016   Procedure: Right Heart Cath;  Surgeon: Jolaine Artist, MD;  Location: [facility] CV LAB;  Service: Cardiovascular;  Laterality: N/A;  . CORONARY ARTERY BYPASS GRAFT  2000   CABG X4  . INSERT / REPLACE / REMOVE PACEMAKER  12/16/2012   LV lead placement; ICD  assessment   . LEFT AND RIGHT HEART CATHETERIZATION WITH CORONARY/GRAFT ANGIOGRAM N/A 04/04/2014   Procedure: LEFT AND RIGHT HEART CATHETERIZATION WITH Beatrix Fetters;  Surgeon: Jolaine Artist, MD;  Location: [facility] CATH LAB;  Service: Cardiovascular;  Laterality: N/A;  . [place]  . URACHAL CYST EXCISION  ~ 1995  . VASECTOMY  ?1967    Current Outpatient Prescriptions  Medication Sig Dispense Refill  . acetaminophen (TYLENOL) 500 MG tablet Take 500 mg by mouth every 6 (six) hours as needed. For pain    . albuterol (PROVENTIL HFA;VENTOLIN HFA) 108 (90 Base) MCG/ACT inhaler Inhale 2 puffs into the lungs every 4 (four) hours as needed for wheezing. 1 Inhaler 6  . allopurinol (ZYLOPRIM) 100 MG tablet Take 1 tablet (100 mg total) by mouth daily. 30 tablet 2  . calcium carbonate (OS-CAL) 600 MG TABS Take 600 mg by mouth daily.      . carvedilol (COREG) 3.125 MG tablet TAKE 1 TABLET BY MOUTH TWICE DAILY WITH MEALS 60 tablet 11  . colchicine 0.6 MG tablet Take 0.6 mg by mouth daily as needed (gouty flare).    Marland Kitchen digoxin (LANOXIN) 0.125 MG tablet TAKE 1 TABLET (0.125 MG TOTAL) BY MOUTH DAILY. 90 tablet 3  . ELIQUIS 5 MG TABS tablet TAKE 1 TABLET BY MOUTH TWICE A DAY 60 tablet 3  . famotidine (PEPCID) 10 MG tablet Take 10 mg by mouth daily.     Marland Kitchen  FLUoxetine (PROZAC) 20 MG capsule TAKE 1 CAPSULE (20 MG TOTAL) BY MOUTH DAILY. 90 capsule 1  . furosemide (LASIX) 80 MG tablet Take 1 tablet (80 mg total) by mouth 2 (two) times daily. 60 tablet 6  . insulin glargine (LANTUS) 100 UNIT/ML injection Inject 45 Units into the skin at bedtime. Sliding scale    . loratadine (CLARITIN) 10 MG tablet Take 10 mg by mouth daily as needed. For allergies    . metolazone (ZAROXOLYN) 2.5 MG tablet Take 1 tablet (2.5 mg total) by mouth daily as needed. For fluid or edema 30 tablet 3  . Multiple Vitamin (MULTIVITAMIN) tablet Take 1 tablet by mouth daily.     Marland Kitchen omega-3 acid ethyl esters  (LOVAZA) 1 G capsule Take 2 g by mouth 2 (two) times daily.      . potassium chloride SA (K-DUR,KLOR-CON) 20 MEQ tablet Take 2 tablets (40 mEq total) by mouth daily. 180 tablet 3  . sacubitril-valsartan (ENTRESTO) 49-51 MG Take 1 tablet by mouth 2 (two) times daily. 60 tablet 6  . spironolactone (ALDACTONE) 25 MG tablet Take 25 mg by mouth daily.      . tamsulosin (FLOMAX) 0.4 MG CAPS capsule Take 0.4 mg by mouth daily after supper.     No current facility-administered medications for this visit.     Allergies:   Ampicillin; Crestor [rosuvastatin calcium]; Orange fruit [citrus]; and Propoxyphene n-acetaminophen   Social History: Social History   Social History  . Marital status: Married    Spouse name: N/A  . Number of children: N/A  . Years of education: N/A   Occupational History  . Not on file.   Social History Main Topics  . Smoking status: Never Smoker  . Smokeless tobacco: Former Systems developer    Types: Snuff, Chew     Comment: 12/16/2012 "stopped chew/snuff in ~ 1992 after 15 yr"  . Alcohol use 4.2 oz/week    7 Shots of liquor per week     Comment: one shot a day  . Drug use: No     Comment: none  . Sexual activity: Yes   Other Topics Concern  . Not on file   Social History Narrative   Lives with wife in [place].     Family History: Family History  Problem Relation Age of Onset  . Heart attack Maternal Grandfather 78  . Heart attack Maternal Grandmother 82  . Heart attack Paternal Grandfather 16  . Heart attack Father 69  . Heart failure Father 60  . Aneurysm Brother   . Diabetes Neg Hx   . Stroke Neg Hx   . Cancer Neg Hx     Review of Systems: All other systems reviewed and are otherwise negative except as noted above.   Physical Exam: VS:  BP 116/74   Pulse 67   Ht 5\' 5"  (1.651 m)   Wt 171 lb 6.4 oz (77.7 kg)   BMI 28.52 kg/m  , BMI Body mass index is 28.52 kg/m.  GEN- The patient is well appearing, alert and oriented x 3 today.   HEENT:  normocephalic, atraumatic; sclera clear, conjunctiva pink; hearing intact; oropharynx clear; neck supple  Lungs- Clear to ausculation bilaterally, normal work of breathing.  No wheezes, rales, rhonchi Heart- Regular rate and rhythm (paced) GI- soft, non-tender, non-distended, bowel sounds present  Extremities- no clubbing, cyanosis, or edema  MS- no significant deformity or atrophy Skin- warm and dry, no rash or lesion; ICD pocket well healed Psych- euthymic  mood, full affect Neuro- strength and sensation are intact  ICD interrogation- reviewed in detail today,  See PACEART report  EKG:  EKG is ordered today. The ekg ordered today shows sinus rhythm with V pacing   Recent Labs: 02/27/2017: B Natriuretic Peptide 371.0 03/18/2017: BUN 23; Creatinine, Ser 1.52; Potassium 4.2; Sodium 135   Wt Readings from Last 3 Encounters:  06/05/17 171 lb 6.4 oz (77.7 kg)  02/27/17 167 lb (75.8 kg)  12/23/16 165 lb 8 oz (75.1 kg)     Other studies Reviewed: Additional studies/ records that were reviewed today include: Dr Caryl Comes and AHF notes  Assessment and Plan:  1.  Chronic systolic dysfunction euvolemic today Stable on an appropriate medical regimen See Pace Art report No changes today Followed by AHF clinic Continue in ICM clinic ICD at [facility]. Risks, benefits to gen change reviewed with the patient who wishes to proceed. Will plan with Dr Caryl Comes at the next available time. This will be the 5th time the pocket has been opened. Consider Tyrx pouch. Hold Eliquis for 2 doses prior to gen change  He has had some hypotension. He is reluctant to change meds without Dr Bensimhon's input. Will send note.   2.  CAD No recent ischemic symptoms Continue medical therapy  3.  Paroxysmal atrial fibrillation Burden by device interrogation <1% Continue Eliquis for CHADS2VASC of 4  4.  VT No recurrence by device interrogation    Current medicines are reviewed at length with the patient today.   The  patient does not have concerns regarding his medicines.  The following changes were made today:  none  Labs/ tests ordered today include: pre-procedure labs  Orders Placed This Encounter  Procedures  . CBC  . Basic Metabolic Panel (BMET)  . CUP PACEART [place]  . EKG 12-Lead     Disposition:   Follow up with Dr Caryl Comes following gen change    Signed, Chanetta Marshall, NP 06/05/2017 12:00 PM  [address] [place] [phone] (office) [phone] (fax)

## 2017-06-05 ENCOUNTER — Ambulatory Visit (INDEPENDENT_AMBULATORY_CARE_PROVIDER_SITE_OTHER): Payer: Medicare Other | Admitting: Nurse Practitioner

## 2017-06-05 ENCOUNTER — Encounter: Payer: Self-pay | Admitting: Nurse Practitioner

## 2017-06-05 VITALS — BP 116/74 | HR 67 | Ht 65.0 in | Wt 171.4 lb

## 2017-06-05 DIAGNOSIS — I480 Paroxysmal atrial fibrillation: Secondary | ICD-10-CM

## 2017-06-05 DIAGNOSIS — I255 Ischemic cardiomyopathy: Secondary | ICD-10-CM

## 2017-06-05 DIAGNOSIS — I472 Ventricular tachycardia, unspecified: Secondary | ICD-10-CM

## 2017-06-05 DIAGNOSIS — I5022 Chronic systolic (congestive) heart failure: Secondary | ICD-10-CM

## 2017-06-05 LAB — CUP PACEART INCLINIC DEVICE CHECK
Date Time Interrogation Session: 20180629113554
Implantable Lead Implant Date: 20020513
Implantable Lead Implant Date: 20140109
Implantable Lead Location: 753860
Implantable Lead Model: 5076
Implantable Pulse Generator Implant Date: 20101013
MDC IDC LEAD IMPLANT DT: 20020513
MDC IDC LEAD LOCATION: 753858
MDC IDC LEAD LOCATION: 753859
MDC IDC LEAD SERIAL: 117908
Pulse Gen Serial Number: 733092

## 2017-06-05 NOTE — Patient Instructions (Signed)
Medication Instructions:  Your physician recommends that you continue on your current medications as directed. Please refer to the Current Medication list given to you today.   Labwork: Your physician recommends that you return for lab work today for a CBC and  BMET   Testing/Procedures:  You will have a gen change on 06/15/17 with Dr. Caryl Comes. Please arrive at the [facility] tower at 7:00 for a 9:00 case.   Follow-Up:   Any Other Special Instructions Will Be Listed Below (If Applicable).     If you need a refill on your cardiac medications before your next appointment, please call your pharmacy.

## 2017-06-06 LAB — BASIC METABOLIC PANEL
BUN / CREAT RATIO: 21 (ref 10–24)
BUN: 33 mg/dL — ABNORMAL HIGH (ref 8–27)
CO2: 25 mmol/L (ref 20–29)
Calcium: 9.5 mg/dL (ref 8.6–10.2)
Chloride: 87 mmol/L — ABNORMAL LOW (ref 96–106)
Creatinine, Ser: 1.59 mg/dL — ABNORMAL HIGH (ref 0.76–1.27)
GFR calc Af Amer: 48 mL/min/{1.73_m2} — ABNORMAL LOW (ref >59)
GFR, EST NON AFRICAN AMERICAN: 41 mL/min/{1.73_m2} — AB (ref >59)
GLUCOSE: 195 mg/dL — AB (ref 65–99)
POTASSIUM: 3.5 mmol/L (ref 3.5–5.2)
SODIUM: 134 mmol/L (ref 134–144)

## 2017-06-06 LAB — CBC
Hematocrit: 45.7 % (ref 37.5–51.0)
Hemoglobin: 15.9 g/dL (ref 13.0–17.7)
MCH: 31.4 pg (ref 26.6–33.0)
MCHC: 34.8 g/dL (ref 31.5–35.7)
MCV: 90 fL (ref 79–97)
Platelets: 297 10*3/uL (ref 150–379)
RBC: 5.07 x10E6/uL (ref 4.14–5.80)
RDW: 14.2 % (ref 12.3–15.4)
WBC: 13.7 10*3/uL — ABNORMAL HIGH (ref 3.4–10.8)

## 2017-06-11 ENCOUNTER — Ambulatory Visit: Payer: Medicare Other | Admitting: Physician Assistant

## 2017-06-15 ENCOUNTER — Ambulatory Visit (HOSPITAL_COMMUNITY)
Admission: RE | Admit: 2017-06-15 | Discharge: 2017-06-15 | Disposition: A | Discharge status: Home / Self Care | Payer: Medicare Other | Source: Ambulatory Visit | Attending: Internal Medicine | Admitting: Internal Medicine

## 2017-06-15 ENCOUNTER — Ambulatory Visit (HOSPITAL_COMMUNITY)
Admission: RE | Disposition: A | Discharge status: Home / Self Care | Payer: Self-pay | Source: Ambulatory Visit | Attending: Internal Medicine

## 2017-06-15 ENCOUNTER — Encounter (HOSPITAL_COMMUNITY): Payer: Self-pay | Admitting: Internal Medicine

## 2017-06-15 DIAGNOSIS — Z7983 Long term (current) use of bisphosphonates: Secondary | ICD-10-CM | POA: Diagnosis not present

## 2017-06-15 DIAGNOSIS — I509 Heart failure, unspecified: Secondary | ICD-10-CM | POA: Diagnosis not present

## 2017-06-15 DIAGNOSIS — R55 Syncope and collapse: Secondary | ICD-10-CM | POA: Diagnosis present

## 2017-06-15 DIAGNOSIS — I2510 Atherosclerotic heart disease of native coronary artery without angina pectoris: Secondary | ICD-10-CM | POA: Insufficient documentation

## 2017-06-15 DIAGNOSIS — Z87891 Personal history of nicotine dependence: Secondary | ICD-10-CM | POA: Insufficient documentation

## 2017-06-15 DIAGNOSIS — I255 Ischemic cardiomyopathy: Secondary | ICD-10-CM | POA: Diagnosis present

## 2017-06-15 DIAGNOSIS — Z880 Allergy status to penicillin: Secondary | ICD-10-CM | POA: Diagnosis not present

## 2017-06-15 DIAGNOSIS — I472 Ventricular tachycardia, unspecified: Secondary | ICD-10-CM

## 2017-06-15 DIAGNOSIS — I480 Paroxysmal atrial fibrillation: Secondary | ICD-10-CM | POA: Diagnosis not present

## 2017-06-15 DIAGNOSIS — Z7901 Long term (current) use of anticoagulants: Secondary | ICD-10-CM | POA: Diagnosis not present

## 2017-06-15 DIAGNOSIS — I252 Old myocardial infarction: Secondary | ICD-10-CM | POA: Insufficient documentation

## 2017-06-15 DIAGNOSIS — I481 Persistent atrial fibrillation: Secondary | ICD-10-CM | POA: Insufficient documentation

## 2017-06-15 DIAGNOSIS — Z794 Long term (current) use of insulin: Secondary | ICD-10-CM | POA: Diagnosis not present

## 2017-06-15 DIAGNOSIS — E119 Type 2 diabetes mellitus without complications: Secondary | ICD-10-CM | POA: Insufficient documentation

## 2017-06-15 DIAGNOSIS — E785 Hyperlipidemia, unspecified: Secondary | ICD-10-CM | POA: Diagnosis not present

## 2017-06-15 DIAGNOSIS — I110 Hypertensive heart disease with heart failure: Secondary | ICD-10-CM | POA: Insufficient documentation

## 2017-06-15 DIAGNOSIS — Z79899 Other long term (current) drug therapy: Secondary | ICD-10-CM | POA: Diagnosis not present

## 2017-06-15 DIAGNOSIS — Z4502 Encounter for adjustment and management of automatic implantable cardiac defibrillator: Secondary | ICD-10-CM | POA: Diagnosis not present

## 2017-06-15 DIAGNOSIS — Z95810 Presence of automatic (implantable) cardiac defibrillator: Secondary | ICD-10-CM | POA: Diagnosis present

## 2017-06-15 DIAGNOSIS — I4891 Unspecified atrial fibrillation: Secondary | ICD-10-CM | POA: Diagnosis present

## 2017-06-15 HISTORY — PX: BIV ICD GENERATOR CHANGEOUT: EP1194

## 2017-06-15 LAB — SURGICAL PCR SCREEN
MRSA, PCR: NEGATIVE
STAPHYLOCOCCUS AUREUS: NEGATIVE

## 2017-06-15 LAB — GLUCOSE, CAPILLARY
Glucose-Capillary: 93 mg/dL (ref 65–99)
Glucose-Capillary: 110 mg/dL — ABNORMAL HIGH (ref 65–99)

## 2017-06-15 SURGERY — BIV ICD GENERATOR CHANGEOUT

## 2017-06-15 MED ORDER — SODIUM CHLORIDE 0.9 % IV SOLN
INTRAVENOUS | Status: DC
  Administered 2017-06-15: 08:00:00 via INTRAVENOUS

## 2017-06-15 MED ORDER — MUPIROCIN 2 % EX OINT
TOPICAL_OINTMENT | CUTANEOUS | Status: AC
  Administered 2017-06-15: 1 via TOPICAL
  Filled 2017-06-15: qty 22

## 2017-06-15 MED ORDER — SODIUM CHLORIDE 0.9 % IV SOLN: INTRAVENOUS | Status: AC

## 2017-06-15 MED ORDER — LIDOCAINE HCL (PF) 1 % IJ SOLN
INTRAMUSCULAR | Status: AC
  Filled 2017-06-15: qty 60

## 2017-06-15 MED ORDER — CHLORHEXIDINE GLUCONATE 4 % EX LIQD: 60.0000 mL | Freq: Once | CUTANEOUS | Status: DC

## 2017-06-15 MED ORDER — MUPIROCIN 2 % EX OINT
1.0000 "application " | TOPICAL_OINTMENT | Freq: Once | CUTANEOUS | Status: AC
  Administered 2017-06-15: 1 via TOPICAL

## 2017-06-15 MED ORDER — ONDANSETRON HCL 4 MG/2ML IJ SOLN: 4.0000 mg | Freq: Four times a day (QID) | INTRAMUSCULAR | Status: DC | PRN

## 2017-06-15 MED ORDER — VANCOMYCIN HCL IN DEXTROSE 1-5 GM/200ML-% IV SOLN
1000.0000 mg | INTRAVENOUS | Status: AC
  Administered 2017-06-15: 1000 mg via INTRAVENOUS

## 2017-06-15 MED ORDER — SODIUM CHLORIDE 0.9 % IR SOLN
80.0000 mg | Status: AC
  Administered 2017-06-15: 80 mg

## 2017-06-15 MED ORDER — FENTANYL CITRATE (PF) 100 MCG/2ML IJ SOLN
INTRAMUSCULAR | Status: AC
  Filled 2017-06-15: qty 2

## 2017-06-15 MED ORDER — LIDOCAINE HCL (PF) 1 % IJ SOLN
INTRAMUSCULAR | Status: DC | PRN
  Administered 2017-06-15: 45 mL

## 2017-06-15 MED ORDER — FENTANYL CITRATE (PF) 100 MCG/2ML IJ SOLN
INTRAMUSCULAR | Status: DC | PRN
  Administered 2017-06-15 (×2): 25 ug via INTRAVENOUS

## 2017-06-15 MED ORDER — SODIUM CHLORIDE 0.9 % IR SOLN
Status: AC
  Filled 2017-06-15: qty 2

## 2017-06-15 MED ORDER — MIDAZOLAM HCL 5 MG/5ML IJ SOLN
INTRAMUSCULAR | Status: AC
Start: 2017-06-15 — End: 2017-06-15
  Filled 2017-06-15: qty 5

## 2017-06-15 MED ORDER — MIDAZOLAM HCL 5 MG/5ML IJ SOLN
INTRAMUSCULAR | Status: DC | PRN
  Administered 2017-06-15 (×4): 1 mg via INTRAVENOUS

## 2017-06-15 MED ORDER — ACETAMINOPHEN 325 MG PO TABS
325.0000 mg | ORAL_TABLET | ORAL | Status: DC | PRN
Start: 2017-06-15 — End: 2017-06-15

## 2017-06-15 MED ORDER — VANCOMYCIN HCL IN DEXTROSE 1-5 GM/200ML-% IV SOLN
INTRAVENOUS | Status: AC
  Filled 2017-06-15: qty 200

## 2017-06-15 SURGICAL SUPPLY — 6 items
CABLE SURGICAL S-101-97-12 (CABLE) ×3 IMPLANT
HEMOSTAT SURGICEL 2X4 FIBR (HEMOSTASIS) ×3 IMPLANT
ICD UNIFY ASURA CRT CD3357-40C (ICD Generator) ×3 IMPLANT
PAD DEFIB LIFELINK (PAD) ×3 IMPLANT
POUCH AIGIS-R ANTIBACT ICD (Mesh General) ×3 IMPLANT
TRAY PACEMAKER INSERTION (CUSTOM PROCEDURE TRAY) ×3 IMPLANT

## 2017-06-15 NOTE — H&P (View-Only) (Signed)
Electrophysiology Office Note Date: 06/05/2017  ID:  Mason Arnold, DOB [birth year], MRN [id number]  PCP: Binnie Rail, MD Primary Cardiologist: Stanford Breed Heart Failure: Bensimhon Electrophysiologist: Caryl Comes  CC: ICD at [place] is a 76 y.o. male seen today for Dr Caryl Comes.  He presents today for routine electrophysiology followup.  Since last being seen in our clinic, the patient reports doing reasoanbly well.  He continues with intermittent hypotension and occasional near syncope.  He denies chest pain, palpitations, dyspnea, PND, orthopnea, nausea, vomiting, frank syncope, edema, weight gain, or early satiety.  He has not had ICD shocks.   Device History: BSX dual chamber ICD implanted 2002 for VT; gen change 2/2 advisory recall 2005; gen change 2010 to STJ CRTD without LV lead placement; LV lead placement 2014 History of appropriate therapy: No History of AAD therapy: No   Past Medical History:  Diagnosis Date  . Anginal pain (No Name)   . Congestive heart failure ([place])   . Diabetes mellitus type II    IDDM , [facility]  . Gout   . Hyperlipidemia   . Hypertension   . Ischemic heart disease   . Left bundle branch block   . Myocardial infarction ([place]) 2000; 2002; 2004; ~ 2006  . Ventricular tachycardia [facility])    Past Surgical History:  Procedure Laterality Date  . A-V CARDIAC PACEMAKER INSERTION  2002; 2010  . BIV ICD GENERTAOR CHANGE OUT N/A 12/16/2012   Procedure: BIV ICD GENERTAOR CHANGE OUT;  Surgeon: Deboraha Sprang, MD;  Location: [facility] CATH LAB;  Service: Cardiovascular;  Laterality: N/A;  . CARDIAC CATHETERIZATION     "3 or 4; never had interventions" (12/16/2012)  . CARDIAC CATHETERIZATION N/A 04/11/2016   Procedure: Right Heart Cath;  Surgeon: Jolaine Artist, MD;  Location: [place] CV LAB;  Service: Cardiovascular;  Laterality: N/A;  . CORONARY ARTERY BYPASS GRAFT  2000   CABG X4  . INSERT / REPLACE / REMOVE PACEMAKER  12/16/2012   LV lead placement; ICD  assessment   . LEFT AND RIGHT HEART CATHETERIZATION WITH CORONARY/GRAFT ANGIOGRAM N/A 04/04/2014   Procedure: LEFT AND RIGHT HEART CATHETERIZATION WITH Beatrix Fetters;  Surgeon: Jolaine Artist, MD;  Location: [facility] CATH LAB;  Service: Cardiovascular;  Laterality: N/A;  . [place]  . URACHAL CYST EXCISION  ~ 1995  . VASECTOMY  ?1967    Current Outpatient Prescriptions  Medication Sig Dispense Refill  . acetaminophen (TYLENOL) 500 MG tablet Take 500 mg by mouth every 6 (six) hours as needed. For pain    . albuterol (PROVENTIL HFA;VENTOLIN HFA) 108 (90 Base) MCG/ACT inhaler Inhale 2 puffs into the lungs every 4 (four) hours as needed for wheezing. 1 Inhaler 6  . allopurinol (ZYLOPRIM) 100 MG tablet Take 1 tablet (100 mg total) by mouth daily. 30 tablet 2  . calcium carbonate (OS-CAL) 600 MG TABS Take 600 mg by mouth daily.      . carvedilol (COREG) 3.125 MG tablet TAKE 1 TABLET BY MOUTH TWICE DAILY WITH MEALS 60 tablet 11  . colchicine 0.6 MG tablet Take 0.6 mg by mouth daily as needed (gouty flare).    Marland Kitchen digoxin (LANOXIN) 0.125 MG tablet TAKE 1 TABLET (0.125 MG TOTAL) BY MOUTH DAILY. 90 tablet 3  . ELIQUIS 5 MG TABS tablet TAKE 1 TABLET BY MOUTH TWICE A DAY 60 tablet 3  . famotidine (PEPCID) 10 MG tablet Take 10 mg by mouth daily.     Marland Kitchen  FLUoxetine (PROZAC) 20 MG capsule TAKE 1 CAPSULE (20 MG TOTAL) BY MOUTH DAILY. 90 capsule 1  . furosemide (LASIX) 80 MG tablet Take 1 tablet (80 mg total) by mouth 2 (two) times daily. 60 tablet 6  . insulin glargine (LANTUS) 100 UNIT/ML injection Inject 45 Units into the skin at bedtime. Sliding scale    . loratadine (CLARITIN) 10 MG tablet Take 10 mg by mouth daily as needed. For allergies    . metolazone (ZAROXOLYN) 2.5 MG tablet Take 1 tablet (2.5 mg total) by mouth daily as needed. For fluid or edema 30 tablet 3  . Multiple Vitamin (MULTIVITAMIN) tablet Take 1 tablet by mouth daily.     Marland Kitchen omega-3 acid ethyl esters  (LOVAZA) 1 G capsule Take 2 g by mouth 2 (two) times daily.      . potassium chloride SA (K-DUR,KLOR-CON) 20 MEQ tablet Take 2 tablets (40 mEq total) by mouth daily. 180 tablet 3  . sacubitril-valsartan (ENTRESTO) 49-51 MG Take 1 tablet by mouth 2 (two) times daily. 60 tablet 6  . spironolactone (ALDACTONE) 25 MG tablet Take 25 mg by mouth daily.      . tamsulosin (FLOMAX) 0.4 MG CAPS capsule Take 0.4 mg by mouth daily after supper.     No current facility-administered medications for this visit.     Allergies:   Ampicillin; Crestor [rosuvastatin calcium]; Orange fruit [citrus]; and Propoxyphene n-acetaminophen   Social History: Social History   Social History  . Marital status: Married    Spouse name: N/A  . Number of children: N/A  . Years of education: N/A   Occupational History  . Not on file.   Social History Main Topics  . Smoking status: Never Smoker  . Smokeless tobacco: Former Systems developer    Types: Snuff, Chew     Comment: 12/16/2012 "stopped chew/snuff in ~ 1992 after 15 yr"  . Alcohol use 4.2 oz/week    7 Shots of liquor per week     Comment: one shot a day  . Drug use: No     Comment: none  . Sexual activity: Yes   Other Topics Concern  . Not on file   Social History Narrative   Lives with wife in [place].     Family History: Family History  Problem Relation Age of Onset  . Heart attack Maternal Grandfather 78  . Heart attack Maternal Grandmother 82  . Heart attack Paternal Grandfather 72  . Heart attack Father 29  . Heart failure Father 58  . Aneurysm Brother   . Diabetes Neg Hx   . Stroke Neg Hx   . Cancer Neg Hx     Review of Systems: All other systems reviewed and are otherwise negative except as noted above.   Physical Exam: VS:  BP 116/74   Pulse 67   Ht 5\' 5"  (1.651 m)   Wt 171 lb 6.4 oz (77.7 kg)   BMI 28.52 kg/m  , BMI Body mass index is 28.52 kg/m.  GEN- The patient is well appearing, alert and oriented x 3 today.   HEENT:  normocephalic, atraumatic; sclera clear, conjunctiva pink; hearing intact; oropharynx clear; neck supple  Lungs- Clear to ausculation bilaterally, normal work of breathing.  No wheezes, rales, rhonchi Heart- Regular rate and rhythm (paced) GI- soft, non-tender, non-distended, bowel sounds present  Extremities- no clubbing, cyanosis, or edema  MS- no significant deformity or atrophy Skin- warm and dry, no rash or lesion; ICD pocket well healed Psych- euthymic  mood, full affect Neuro- strength and sensation are intact  ICD interrogation- reviewed in detail today,  See PACEART report  EKG:  EKG is ordered today. The ekg ordered today shows sinus rhythm with V pacing   Recent Labs: 02/27/2017: B Natriuretic Peptide 371.0 03/18/2017: BUN 23; Creatinine, Ser 1.52; Potassium 4.2; Sodium 135   Wt Readings from Last 3 Encounters:  06/05/17 171 lb 6.4 oz (77.7 kg)  02/27/17 167 lb (75.8 kg)  12/23/16 165 lb 8 oz (75.1 kg)     Other studies Reviewed: Additional studies/ records that were reviewed today include: Dr Caryl Comes and AHF notes  Assessment and Plan:  1.  Chronic systolic dysfunction euvolemic today Stable on an appropriate medical regimen See Pace Art report No changes today Followed by AHF clinic Continue in ICM clinic ICD at [facility]. Risks, benefits to gen change reviewed with the patient who wishes to proceed. Will plan with Dr Caryl Comes at the next available time. This will be the 5th time the pocket has been opened. Consider Tyrx pouch. Hold Eliquis for 2 doses prior to gen change  He has had some hypotension. He is reluctant to change meds without Dr Bensimhon's input. Will send note.   2.  CAD No recent ischemic symptoms Continue medical therapy  3.  Paroxysmal atrial fibrillation Burden by device interrogation <1% Continue Eliquis for CHADS2VASC of 4  4.  VT No recurrence by device interrogation    Current medicines are reviewed at length with the patient today.   The  patient does not have concerns regarding his medicines.  The following changes were made today:  none  Labs/ tests ordered today include: pre-procedure labs  Orders Placed This Encounter  Procedures  . CBC  . Basic Metabolic Panel (BMET)  . CUP PACEART [place]  . EKG 12-Lead     Disposition:   Follow up with Dr Caryl Comes following gen change    Signed, Chanetta Marshall, NP 06/05/2017 12:00 PM  [place]  [id number] [phone] (office) [phone] (fax)

## 2017-06-15 NOTE — Discharge Instructions (Signed)
MAY RESUME ELIQUIS ON Wednesday 06/17/17   Keep incision clean and dry for 24 hours No driving for 4 days  You can remove outer dressing if you have one tomorrow. Leave steri-strips or glue strip on until seen in the office for wound check appointment. Dermabond glue will come off in 10-14days and if not will be removed at office visit Call the office [phone]) for redness, drainage, swelling, or fever.

## 2017-06-15 NOTE — Interval H&P Note (Signed)
ICD Criteria  Current LVEF:22%. Within 12 months prior to implant: No   Heart failure history: Yes, Class III  Cardiomyopathy history: Yes, Ischemic Cardiomyopathy - Prior MI.  Atrial Fibrillation/Atrial Flutter: Yes, Persistent (> 7 Days).  Ventricular tachycardia history: Yes, Hemodynamic instability present. VT Type: Sustained Ventricular Tachycardia - Monomorphic.  Cardiac arrest history: Yes, Ventricular Tachycardia.  History of syndromes with risk of sudden death: No.  Previous ICD: Yes, Reason for ICD:  Secondary prevention.  Current ICD indication: Secondary  PPM indication: No.   Class I or II Bradycardia indication present: No  Beta Blocker therapy for 3 or more months: Yes, prescribed.   Ace Inhibitor/ARB therapy for 3 or more months: Yes, prescribed.   History and Physical Interval Note:  06/15/2017 10:06 AM  Mason Arnold  has presented today for surgery, with the diagnosis of cm  The various methods of treatment have been discussed with the patient and family. After consideration of risks, benefits and other options for treatment, the patient has consented to  Procedure(s): BiV ICD Generator Changeout (N/A) as a surgical intervention .  The patient's history has been reviewed, patient examined, no change in status, stable for surgery.  I have reviewed the patient's chart and labs.  Questions were answered to the patient's satisfaction.     Virl Axe

## 2017-06-15 NOTE — Progress Notes (Signed)
Dr Caryl Comes notified of systolic  B/P 02'C and 78 and client without complaints and states B/P runs low, no new orders from Dr Caryl Comes and OK to D/C home

## 2017-06-15 NOTE — Progress Notes (Signed)
Called Renee,PA about client resuming eliquis and per [place] client may restart on Weds

## 2017-06-17 MED FILL — Sodium Chloride Irrigation Soln 0.9%: Qty: 500 | Status: AC

## 2017-06-17 MED FILL — Vancomycin HCl-Dextrose IV Soln 1 GM/200ML-5%: INTRAVENOUS | Qty: 200 | Status: AC

## 2017-06-17 MED FILL — Gentamicin Sulfate Inj 40 MG/ML: INTRAMUSCULAR | Qty: 2 | Status: AC

## 2017-06-29 ENCOUNTER — Ambulatory Visit (INDEPENDENT_AMBULATORY_CARE_PROVIDER_SITE_OTHER): Payer: Medicare Other | Admitting: *Deleted

## 2017-06-29 DIAGNOSIS — I5022 Chronic systolic (congestive) heart failure: Secondary | ICD-10-CM

## 2017-06-29 DIAGNOSIS — I472 Ventricular tachycardia, unspecified: Secondary | ICD-10-CM

## 2017-06-29 DIAGNOSIS — I255 Ischemic cardiomyopathy: Secondary | ICD-10-CM | POA: Diagnosis not present

## 2017-06-29 DIAGNOSIS — Z95810 Presence of automatic (implantable) cardiac defibrillator: Secondary | ICD-10-CM | POA: Diagnosis not present

## 2017-06-29 LAB — CUP PACEART INCLINIC DEVICE CHECK
HighPow Impedance: 48.5136
Implantable Lead Implant Date: 20020513
Implantable Lead Location: 753858
Implantable Lead Location: 753859
Implantable Lead Model: 148
Implantable Lead Model: 5076
Implantable Lead Serial Number: 117908
Implantable Pulse Generator Implant Date: 20180709
Lead Channel Impedance Value: 525 Ohm
Lead Channel Pacing Threshold Pulse Width: 0.6 ms
Lead Channel Pacing Threshold Pulse Width: 0.8 ms
Lead Channel Sensing Intrinsic Amplitude: 5 mV
Lead Channel Setting Pacing Amplitude: 2.5 V
Lead Channel Setting Pacing Pulse Width: 0.6 ms
MDC IDC LEAD IMPLANT DT: 20020513
MDC IDC LEAD IMPLANT DT: 20140109
MDC IDC LEAD LOCATION: 753860
MDC IDC MSMT BATTERY REMAINING LONGEVITY: 73 mo
MDC IDC MSMT LEADCHNL LV IMPEDANCE VALUE: 600 Ohm
MDC IDC MSMT LEADCHNL LV PACING THRESHOLD AMPLITUDE: 1.75 V
MDC IDC MSMT LEADCHNL RA PACING THRESHOLD AMPLITUDE: 1 V
MDC IDC MSMT LEADCHNL RA PACING THRESHOLD PULSEWIDTH: 0.4 ms
MDC IDC MSMT LEADCHNL RV IMPEDANCE VALUE: 837.5 Ohm
MDC IDC MSMT LEADCHNL RV PACING THRESHOLD AMPLITUDE: 0.75 V
MDC IDC MSMT LEADCHNL RV SENSING INTR AMPL: 12 mV
MDC IDC SESS DTM: 20180723151311
MDC IDC SET LEADCHNL RA PACING AMPLITUDE: 2.5 V
MDC IDC SET LEADCHNL RV PACING AMPLITUDE: 2.5 V
MDC IDC SET LEADCHNL RV PACING PULSEWIDTH: 0.8 ms
MDC IDC SET LEADCHNL RV SENSING SENSITIVITY: 0.5 mV
MDC IDC STAT BRADY RA PERCENT PACED: 87 %
MDC IDC STAT BRADY RV PERCENT PACED: 98 %
Pulse Gen Serial Number: 7398129

## 2017-06-29 NOTE — Progress Notes (Signed)
Wound check appointment. Dermabond removed. Wound without redness or edema. Incision edges approximated, wound well healed. Patient educated about signs/symptoms of infection and when to call the office. Normal device function. Thresholds, sensing, and impedances consistent with implant measurements. Device programmed at chronic outputs. Histogram distribution appropriate for patient and level of activity. No mode switches or ventricular arrhythmias noted. Patient educated about wound care, arm mobility, lifting restrictions, shock plan, and Merlin monitor. ROV with SK on 09/22/17.

## 2017-06-30 ENCOUNTER — Other Ambulatory Visit: Payer: Self-pay | Admitting: Internal Medicine

## 2017-06-30 DIAGNOSIS — I480 Paroxysmal atrial fibrillation: Secondary | ICD-10-CM

## 2017-08-03 ENCOUNTER — Telehealth (HOSPITAL_COMMUNITY): Payer: Self-pay | Admitting: *Deleted

## 2017-08-03 NOTE — Telephone Encounter (Signed)
Patient's wife called stating that patient had a syncopal episode this morning. He did not hit his head but landed on his side. Patient is feeling fine now and has contacted the device clinic to check his [place] transmission.

## 2017-08-05 ENCOUNTER — Telehealth: Payer: Self-pay | Admitting: *Deleted

## 2017-08-05 NOTE — Telephone Encounter (Signed)
Per [employer] transmission was normal. PT scheduled for nurse visit at 8:40 for orthostatics and labs (bmet). Pt aware.

## 2017-08-05 NOTE — Telephone Encounter (Signed)
Pts wife called back today stating she has not heard back from the device clinic. She said pt does not complain normally but he is c/o "horrible headache and dizziness". Spoke with Amy who contacted Amber with the device clinic. Amber is checking on device transmission and will follow up with patient. Pts wife aware.

## 2017-08-05 NOTE — Telephone Encounter (Signed)
Remote transmission from 08/04/17 @ 2003 reviewed. Presenting rhythm: AsBp, ApBp. <1% AT/AF burden, max dur. 40mins, last 8/14-sensor. No ventricular arrhythmias recorded. Bp 99%. Stable thoracic impedance. Normal device function.   Spoke to patient about recent sx's and syncopal episode from 08/03/17. Patient states that he was talking to his daughter and when he turned around he passed out. Patient states that he's noticed more dizziness over the past 3 weeks. He said he notices it more when he goes from a sitting to a standing position and also more so on days when he takes his Metolazone. I asked patient if he's been checking his BP lately. Patient stated that his BP was 94/55 after his syncopal episode on 8/27. I asked patient to check his BP while we were talking on the phone. BP was 102/69 sitting, 84/51 standing. Patient states that he did take the Metolazone this am, and he's also taken his am dose of coreg, lasix, and entresto. Patient states that he has yet to take the digoxin, spironolactone, pm coreg, pm lasix, or pm entresto.   Will forward information to Dr.Bensimhon and Ranelle Oyster, RN.

## 2017-08-05 NOTE — Telephone Encounter (Signed)
Spoke with Mason Booze, NP and she advises patient to hold lasix and Entresto tonight and tomorrow.  He is to also stop Metolazone.  Orthostatic check Friday with bmet.   Patient is agreeable and aware of appointment time Friday.

## 2017-08-06 ENCOUNTER — Encounter (HOSPITAL_COMMUNITY): Payer: Medicare Other

## 2017-08-07 ENCOUNTER — Encounter (HOSPITAL_COMMUNITY): Payer: Self-pay

## 2017-08-07 ENCOUNTER — Ambulatory Visit (HOSPITAL_COMMUNITY)
Admission: RE | Admit: 2017-08-07 | Discharge: 2017-08-07 | Disposition: A | Discharge status: Home / Self Care | Payer: Medicare Other | Source: Ambulatory Visit | Attending: Cardiology | Admitting: Cardiology

## 2017-08-07 VITALS — BP 102/60 | HR 66 | Wt 175.2 lb

## 2017-08-07 DIAGNOSIS — R42 Dizziness and giddiness: Secondary | ICD-10-CM | POA: Insufficient documentation

## 2017-08-07 LAB — BASIC METABOLIC PANEL
ANION GAP: 12 (ref 5–15)
BUN: 28 mg/dL — AB (ref 6–20)
CALCIUM: 9.3 mg/dL (ref 8.9–10.3)
CO2: 30 mmol/L (ref 22–32)
CREATININE: 1.71 mg/dL — AB (ref 0.61–1.24)
Chloride: 95 mmol/L — ABNORMAL LOW (ref 101–111)
GFR calc Af Amer: 43 mL/min — ABNORMAL LOW (ref >60)
GFR calc non Af Amer: 37 mL/min — ABNORMAL LOW (ref >60)
GLUCOSE: 162 mg/dL — AB (ref 65–99)
Potassium: 3.8 mmol/L (ref 3.5–5.1)
Sodium: 137 mmol/L (ref 135–145)

## 2017-08-07 LAB — MAGNESIUM: Magnesium: 2.5 mg/dL — ABNORMAL HIGH (ref 1.7–2.4)

## 2017-08-11 ENCOUNTER — Telehealth (HOSPITAL_COMMUNITY): Payer: Self-pay | Admitting: Cardiology

## 2017-08-11 NOTE — Telephone Encounter (Signed)
Late entry: Patients wife called 08/07/17, with a request to review lab results. States meds were on hold pending labs drawn earlier in the day  Advised labs were stable and patient should follow all previous orders, ok to resume meds as previously prescribed

## 2017-08-13 NOTE — Progress Notes (Signed)
No ICM remote transmission received for 08/03/2017 and next ICM transmission scheduled for 08/21/2017.

## 2017-08-21 ENCOUNTER — Ambulatory Visit (INDEPENDENT_AMBULATORY_CARE_PROVIDER_SITE_OTHER): Payer: Medicare Other

## 2017-08-21 DIAGNOSIS — I5022 Chronic systolic (congestive) heart failure: Secondary | ICD-10-CM

## 2017-08-21 DIAGNOSIS — Z95810 Presence of automatic (implantable) cardiac defibrillator: Secondary | ICD-10-CM

## 2017-08-21 NOTE — Progress Notes (Signed)
EPIC Encounter for ICM Monitoring  Patient Name: Mason Arnold is a 76 y.o. male Date: 08/21/2017 Primary Care Physican: Binnie Rail, MD Primary Cardiologist:[place]/ HF clinic [place] Electrophysiologist: Caryl Comes Dry Weight:unknown Bi-V Pacing: 99%      Heart Failure questions reviewed, pt asymptomatic.   Thoracic impedance normal but was abnormal suggesting fluid accumulation from 08/04/2017 to 08/12/2017.  Prescribed dosage: Furosemide 80 mg 1 tablet twice a day. Potassium 20 mEq 2 tablets daily (40 mEq total). Spironolactone 25 mg 1 tablet daily. Metolazone 2.5 mg by mouth daily as needed for fluid or edema    Labs: 08/07/2017 Creatinine 1.71, BUN 28, Potassium 3.8, Sodium 137, EGFR 37-43 06/05/2017 Creatinine 1.59, BUN 33, Potassium 3.5, Sodium 134, EGFR 41-48 03/18/2017 Creatinine 1.52, BUN 23, Potassium 4.2, Sodium 135, EGFR 42-49 02/27/2017 Creatinine 1.63, BUN 17, Potassium 3.4, Sodium 136, EGFR 39-45 12/23/2016 Creatinine 1.27, BUN 15, Potassium 4.3, Sodium 137, EGFR 53->60 08/13/2016 Creatinine 1.36, BUN 21, Potassium 4.1, Sodium 135, EGFR 49-57  04/02/2016 Creatinine 1.51, BUN 17, Potassium 4.6, Sodium 136, EGFR 43-50   Recommendations: No changes.   Encouraged to call for fluid symptoms.  Follow-up plan: ICM clinic phone appointment on 10/23/2017.  Office appointment scheduled 09/22/2017 with Dr. Caryl Comes and 09/28/2017 with Dr Haroldine Laws.  Copy of ICM check sent to Dr. Caryl Comes.   3 month ICM trend: 08/21/2017   1 Year ICM trend:      Rosalene Billings, RN 08/21/2017 9:42 AM

## 2017-09-22 ENCOUNTER — Ambulatory Visit (INDEPENDENT_AMBULATORY_CARE_PROVIDER_SITE_OTHER): Payer: Medicare Other | Admitting: Internal Medicine

## 2017-09-22 ENCOUNTER — Encounter: Payer: Self-pay | Admitting: Internal Medicine

## 2017-09-22 VITALS — BP 96/54 | HR 80 | Ht 65.0 in | Wt 175.0 lb

## 2017-09-22 DIAGNOSIS — I5022 Chronic systolic (congestive) heart failure: Secondary | ICD-10-CM | POA: Diagnosis not present

## 2017-09-22 DIAGNOSIS — I472 Ventricular tachycardia, unspecified: Secondary | ICD-10-CM

## 2017-09-22 DIAGNOSIS — I255 Ischemic cardiomyopathy: Secondary | ICD-10-CM | POA: Diagnosis not present

## 2017-09-22 MED ORDER — METOLAZONE 2.5 MG PO TABS: 2.5000 mg | ORAL_TABLET | Freq: Every day | ORAL | 3 refills | Status: DC | PRN

## 2017-09-22 NOTE — Patient Instructions (Signed)
Medication Instructions:  Your physician recommends that you continue on your current medications as directed. Please refer to the Current Medication list given to you today.  -- If you need a refill on your cardiac medications before your next appointment, please call your pharmacy. --  Labwork: Your physician recommends that you return for lab work today:    CBC/ DIGOXIN/ BMET   Testing/Procedures: None ordered  Follow-Up: Your physician wants you to follow-up in: 1 year with Dr. Caryl Comes.  You will receive a reminder letter in the mail two months in advance. If you don't receive a letter, please call our office to schedule the follow-up appointment.  Remote monitoring is used to monitor your ICD from home. This monitoring reduces the number of office visits required to check your device to one time per year. It allows [place] to keep an eye on the functioning of your device to ensure it is working properly. You are scheduled for a device check from home on 12/22/2017. You may send your transmission at any time that day. If you have a wireless device, the transmission will be sent automatically. After your physician reviews your transmission, you will receive a postcard with your next transmission date.    Thank you for choosing [facility]!!   Frederik Schmidt, RN [phone]  Any Other Special Instructions Will Be Listed Below (If Applicable).

## 2017-09-22 NOTE — Progress Notes (Signed)
[facility] Patient Care Team: Binnie Rail, MD as PCP - General (Internal Medicine)   HPI  Mason Arnold is a 76 y.o. male he is seen in followup for ischemic cardiomyopathy status post coronary bypass graft, he is status post ICD implantation; underwent generator replacement in October 2010.  His last Myoview was performed in July of 2011. At that time, he had an ejection fraction of 28%. There was a previous infarct involving the anterior wall, apex, and inferior wall with minimal ischemia noted  He underwent catheterization 4/15 with patent grafts.  Echocardiogram in July of 2013 showed an ejection fraction of 20-25%. There was mild left ventricular enlargement, Moderate to severe left atrial enlargement and mildly reduced RV function   Echo 8/16 EF 15-20% RHC >>RA = 7 RV = 48/2/8 PA = 54/20 (33) PCW = 26 December 2012 he underwent CRT upgrade   10/17 Cr 1.58 K4.7 Hgb 15.3  (Labs reviewed from the [place]      Date Cr K Dig Hgb  1 /18 1.27 4.3 0.2    8/18 1.71 3.8  15.9     He denies chest pain. He has some dizzinesss  No edema  Brother died of CA last week  They were close        Past Medical History:  Diagnosis Date  . Anginal pain ([place])   . Congestive heart failure ([place])   . Diabetes mellitus type II    IDDM , [facility]  . Gout   . Hyperlipidemia   . Hypertension   . Ischemic heart disease   . Left bundle branch block   . Myocardial infarction ([place]) 2000; 2002; 2004; ~ 2006  . Ventricular tachycardia [facility])     Past Surgical History:  Procedure Laterality Date  . A-V CARDIAC PACEMAKER INSERTION  2002; 2010  . BIV ICD GENERATOR CHANGEOUT N/A 06/15/2017   Procedure: BiV ICD Generator Changeout;  Surgeon: Deboraha Sprang, MD;  Location: [place] CV LAB;  Service: Cardiovascular;  Laterality: N/A;  . BIV ICD GENERTAOR CHANGE OUT N/A 12/16/2012   Procedure: BIV ICD GENERTAOR CHANGE OUT;  Surgeon: Deboraha Sprang, MD;  Location: [facility] CATH LAB;  Service: Cardiovascular;   Laterality: N/A;  . CARDIAC CATHETERIZATION     "3 or 4; never had interventions" (12/16/2012)  . CARDIAC CATHETERIZATION N/A 04/11/2016   Procedure: Right Heart Cath;  Surgeon: Jolaine Artist, MD;  Location: [place] CV LAB;  Service: Cardiovascular;  Laterality: N/A;  . CORONARY ARTERY BYPASS GRAFT  2000   CABG X4  . INSERT / REPLACE / REMOVE PACEMAKER  12/16/2012   LV lead placement; ICD assessment   . LEFT AND RIGHT HEART CATHETERIZATION WITH CORONARY/GRAFT ANGIOGRAM N/A 04/04/2014   Procedure: LEFT AND RIGHT HEART CATHETERIZATION WITH Beatrix Fetters;  Surgeon: Jolaine Artist, MD;  Location: [facility] CATH LAB;  Service: Cardiovascular;  Laterality: N/A;  . [place]  . URACHAL CYST EXCISION  ~ 1995  . VASECTOMY  ?1967    Current Outpatient Prescriptions  Medication Sig Dispense Refill  . acetaminophen (TYLENOL) 500 MG tablet Take 500 mg by mouth every 6 (six) hours as needed. For pain    . albuterol (PROVENTIL HFA;VENTOLIN HFA) 108 (90 Base) MCG/ACT inhaler Inhale 2 puffs into the lungs every 4 (four) hours as needed for wheezing. 1 Inhaler 6  . allopurinol (ZYLOPRIM) 100 MG tablet Take 1 tablet (100 mg total) by mouth daily. (Patient taking differently: Take 100 mg by  mouth daily at 8 pm. (1900)) 30 tablet 2  . calcium carbonate (OS-CAL) 600 MG TABS Take 600 mg by mouth daily after breakfast.     . Carboxymethylcellulose Sodium (THERATEARS) 0.25 % SOLN Place 1 drop into both eyes 3 (three) times daily as needed (for dry eyes.).    Marland Kitchen carvedilol (COREG) 3.125 MG tablet TAKE 1 TABLET BY MOUTH TWICE DAILY WITH MEALS (Patient taking differently: TAKE 1 TABLET BY MOUTH TWICE DAILY WITH MEALS (0700 & 2100)) 60 tablet 11  . colchicine 0.6 MG tablet Take 0.6 mg by mouth daily as needed (gout flare up).     . digoxin (LANOXIN) 0.125 MG tablet TAKE 1 TABLET (0.125 MG TOTAL) BY MOUTH DAILY. (Patient taking differently: TAKE 1 TABLET (0.125 MG TOTAL) BY MOUTH  DAILY IN THE EVENING. (2100)) 90 tablet 3  . ELIQUIS 5 MG TABS tablet TAKE 1 TABLET BY MOUTH TWICE A DAY (Patient taking differently: TAKE 1 TABLET BY MOUTH TWICE A DAY (0700 & 2100)) 60 tablet 3  . famotidine (PEPCID) 10 MG tablet Take 10 mg by mouth daily before supper. 2 HRS BEFORE SUPPER.    Marland Kitchen FLUoxetine (PROZAC) 20 MG capsule TAKE 1 CAPSULE (20 MG TOTAL) BY MOUTH DAILY. 90 capsule 0  . furosemide (LASIX) 80 MG tablet Take 1 tablet (80 mg total) by mouth 2 (two) times daily. (Patient taking differently: Take 80 mg by mouth 2 (two) times daily. (0700 & 1900)) 60 tablet 6  . ibuprofen (ADVIL,MOTRIN) 200 MG tablet Take 200 mg by mouth every 8 (eight) hours as needed (for pain.).    Marland Kitchen insulin glargine (LANTUS) 100 UNIT/ML injection Inject 80 Units into the skin daily at 10 pm. (2100)    . loratadine (CLARITIN) 10 MG tablet Take 10 mg by mouth daily as needed. For allergies    . metolazone (ZAROXOLYN) 2.5 MG tablet Take 1 tablet (2.5 mg total) by mouth daily as needed. For fluid or edema (Patient taking differently: Take 2.5 mg by mouth daily as needed. For fluid or edema (TYPICALLY TWICE A WEEK)) 30 tablet 3  . Multiple Vitamin (MULTIVITAMIN) tablet Take 1 tablet by mouth daily after breakfast.     . omega-3 acid ethyl esters (LOVAZA) 1 G capsule Take 2 g by mouth 2 (two) times daily. (0700 & 1900)    . potassium chloride SA (K-DUR,KLOR-CON) 20 MEQ tablet Take 2 tablets (40 mEq total) by mouth daily. (Patient taking differently: Take 20 mEq by mouth 2 (two) times daily. (0700 & 1900)) 180 tablet 3  . sacubitril-valsartan (ENTRESTO) 49-51 MG Take 1 tablet by mouth 2 (two) times daily. (Patient taking differently: Take 1 tablet by mouth 2 (two) times daily. (0700 & 1900)) 60 tablet 6  . spironolactone (ALDACTONE) 25 MG tablet Take 25 mg by mouth daily. (1900)     No current facility-administered medications for this visit.     Allergies  Allergen Reactions  . Ampicillin Rash    Has patient had a  PCN reaction causing immediate rash, facial/tongue/throat swelling, SOB or lightheadedness with hypotension:No Has patient had a PCN reaction causing severe rash involving mucus membranes or skin necrosis: No Has patient had a PCN reaction that required hospitalization: No Has patient had a PCN reaction occurring within the last 10 years: No If all of the above answers are "NO", then may proceed with Cephalosporin use.   Alveta Heimlich [Rosuvastatin Calcium] Rash and Other (See Comments)    Rash as nodules ("like strawberries")  . Orange  Fruit [Citrus] Swelling and Other (See Comments)    Lips swelling, severe headache (including lemons and other citrus fruits)  . Propoxyphene N-Acetaminophen Other (See Comments)    hallucinations  [darvicet]    Review of Systems negative except from HPI and PMH  Physical Exam BP (!) 96/54   Pulse 80   Ht 5\' 5"  (1.651 m)   Wt 175 lb (79.4 kg)   SpO2 96%   BMI 29.12 kg/m  Well developed and nourished in no acute distress HENT normal Neck supple with JVP-flat Clear Regular rate and rhythm, no murmurs or gallops Abd-soft with active BS No Clubbing cyanosis edema Skin-warm and dry A & Oriented  Grossly normal sensory and motor function  ECG dated today AV pacing at 51 with an up right QRS in V1 and negative in lead 1 consistent with biventricular pacing   Assessment and  Plan  Ischemic cardiomyopathy  Congestive heart failure  Implantable defibrillator-St. Jude-CRT The patient's device was interrogated.  The information was reviewed. No changes were made in the programming.     Atrial fibrillation  Ventricular tachycardia  Renal insufficiency grade 3 Est GFR 40  Euvolemic continue current meds  No intercurrent atrial fibrillation or flutter  On Anticoagulation;  No bleeding issues   apixoban dose is correct given age/weight  Without symptoms of ischemia

## 2017-09-23 ENCOUNTER — Other Ambulatory Visit: Payer: Self-pay | Admitting: Internal Medicine

## 2017-09-23 DIAGNOSIS — I480 Paroxysmal atrial fibrillation: Secondary | ICD-10-CM

## 2017-09-23 LAB — CUP PACEART INCLINIC DEVICE CHECK
Battery Remaining Longevity: 69 mo
Brady Statistic RV Percent Paced: 99 %
Date Time Interrogation Session: 20181016173849
HighPow Impedance: 44.8138
Implantable Lead Location: 753858
Implantable Lead Location: 753859
Implantable Lead Location: 753860
Implantable Lead Model: 5076
Implantable Lead Serial Number: 117908
Lead Channel Pacing Threshold Amplitude: 1 V
Lead Channel Pacing Threshold Amplitude: 1.25 V
Lead Channel Pacing Threshold Amplitude: 1.25 V
Lead Channel Pacing Threshold Amplitude: 2 V
Lead Channel Pacing Threshold Pulse Width: 0.4 ms
Lead Channel Pacing Threshold Pulse Width: 0.6 ms
Lead Channel Pacing Threshold Pulse Width: 0.8 ms
Lead Channel Sensing Intrinsic Amplitude: 5 mV
Lead Channel Setting Pacing Amplitude: 2.5 V
Lead Channel Setting Pacing Amplitude: 2.5 V
Lead Channel Setting Pacing Pulse Width: 0.8 ms
MDC IDC LEAD IMPLANT DT: 20020513
MDC IDC LEAD IMPLANT DT: 20020513
MDC IDC LEAD IMPLANT DT: 20140109
MDC IDC MSMT LEADCHNL LV IMPEDANCE VALUE: 600 Ohm
MDC IDC MSMT LEADCHNL LV PACING THRESHOLD AMPLITUDE: 2 V
MDC IDC MSMT LEADCHNL LV PACING THRESHOLD PULSEWIDTH: 0.6 ms
MDC IDC MSMT LEADCHNL RA IMPEDANCE VALUE: 525 Ohm
MDC IDC MSMT LEADCHNL RA PACING THRESHOLD PULSEWIDTH: 0.4 ms
MDC IDC MSMT LEADCHNL RV IMPEDANCE VALUE: 725 Ohm
MDC IDC MSMT LEADCHNL RV PACING THRESHOLD AMPLITUDE: 1 V
MDC IDC MSMT LEADCHNL RV PACING THRESHOLD PULSEWIDTH: 0.8 ms
MDC IDC MSMT LEADCHNL RV SENSING INTR AMPL: 12 mV
MDC IDC PG IMPLANT DT: 20180709
MDC IDC SET LEADCHNL LV PACING PULSEWIDTH: 0.6 ms
MDC IDC SET LEADCHNL RV PACING AMPLITUDE: 2.5 V
MDC IDC SET LEADCHNL RV SENSING SENSITIVITY: 0.5 mV
MDC IDC STAT BRADY RA PERCENT PACED: 87 %
Pulse Gen Serial Number: 7398129

## 2017-09-23 LAB — CBC WITH DIFFERENTIAL/PLATELET
BASOS: 0 %
Basophils Absolute: 0.1 10*3/uL (ref 0.0–0.2)
EOS (ABSOLUTE): 0.2 10*3/uL (ref 0.0–0.4)
EOS: 1 %
HEMATOCRIT: 44.9 % (ref 37.5–51.0)
Hemoglobin: 15.7 g/dL (ref 13.0–17.7)
Immature Grans (Abs): 0.1 10*3/uL (ref 0.0–0.1)
Immature Granulocytes: 1 %
LYMPHS ABS: 2.5 10*3/uL (ref 0.7–3.1)
Lymphs: 15 %
MCH: 31.5 pg (ref 26.6–33.0)
MCHC: 35 g/dL (ref 31.5–35.7)
MCV: 90 fL (ref 79–97)
MONOS ABS: 1.1 10*3/uL — AB (ref 0.1–0.9)
Monocytes: 7 %
NEUTROS ABS: 12.5 10*3/uL — AB (ref 1.4–7.0)
Neutrophils: 76 %
Platelets: 283 10*3/uL (ref 150–379)
RBC: 4.98 x10E6/uL (ref 4.14–5.80)
RDW: 14.1 % (ref 12.3–15.4)
WBC: 16.4 10*3/uL — ABNORMAL HIGH (ref 3.4–10.8)

## 2017-09-23 LAB — BASIC METABOLIC PANEL
BUN/Creatinine Ratio: 18 (ref 10–24)
BUN: 36 mg/dL — ABNORMAL HIGH (ref 8–27)
CO2: 21 mmol/L (ref 20–29)
Calcium: 9.5 mg/dL (ref 8.6–10.2)
Chloride: 88 mmol/L — ABNORMAL LOW (ref 96–106)
Creatinine, Ser: 1.98 mg/dL — ABNORMAL HIGH (ref 0.76–1.27)
GFR, EST AFRICAN AMERICAN: 37 mL/min/{1.73_m2} — AB (ref >59)
GFR, EST NON AFRICAN AMERICAN: 32 mL/min/{1.73_m2} — AB (ref >59)
Glucose: 203 mg/dL — ABNORMAL HIGH (ref 65–99)
POTASSIUM: 3.7 mmol/L (ref 3.5–5.2)
SODIUM: 136 mmol/L (ref 134–144)

## 2017-09-23 LAB — DIGOXIN LEVEL: Digoxin, Serum: 0.4 ng/mL — ABNORMAL LOW (ref 0.5–0.9)

## 2017-09-28 ENCOUNTER — Other Ambulatory Visit: Payer: Self-pay | Admitting: *Deleted

## 2017-09-28 ENCOUNTER — Encounter (HOSPITAL_COMMUNITY): Payer: Self-pay | Admitting: Internal Medicine

## 2017-09-28 ENCOUNTER — Ambulatory Visit (HOSPITAL_COMMUNITY)
Admission: RE | Admit: 2017-09-28 | Discharge: 2017-09-28 | Disposition: A | Discharge status: Home / Self Care | Payer: Medicare Other | Source: Ambulatory Visit | Attending: Internal Medicine | Admitting: Internal Medicine

## 2017-09-28 ENCOUNTER — Telehealth: Payer: Self-pay

## 2017-09-28 VITALS — BP 110/68 | HR 66 | Wt 176.8 lb

## 2017-09-28 DIAGNOSIS — Z794 Long term (current) use of insulin: Secondary | ICD-10-CM | POA: Diagnosis not present

## 2017-09-28 DIAGNOSIS — Z95810 Presence of automatic (implantable) cardiac defibrillator: Secondary | ICD-10-CM | POA: Diagnosis not present

## 2017-09-28 DIAGNOSIS — Z79899 Other long term (current) drug therapy: Secondary | ICD-10-CM | POA: Diagnosis not present

## 2017-09-28 DIAGNOSIS — Z951 Presence of aortocoronary bypass graft: Secondary | ICD-10-CM | POA: Insufficient documentation

## 2017-09-28 DIAGNOSIS — R55 Syncope and collapse: Secondary | ICD-10-CM | POA: Insufficient documentation

## 2017-09-28 DIAGNOSIS — I5022 Chronic systolic (congestive) heart failure: Secondary | ICD-10-CM

## 2017-09-28 DIAGNOSIS — M1000 Idiopathic gout, unspecified site: Secondary | ICD-10-CM

## 2017-09-28 DIAGNOSIS — I2510 Atherosclerotic heart disease of native coronary artery without angina pectoris: Secondary | ICD-10-CM | POA: Diagnosis not present

## 2017-09-28 DIAGNOSIS — I480 Paroxysmal atrial fibrillation: Secondary | ICD-10-CM | POA: Insufficient documentation

## 2017-09-28 DIAGNOSIS — I255 Ischemic cardiomyopathy: Secondary | ICD-10-CM | POA: Insufficient documentation

## 2017-09-28 DIAGNOSIS — Z8249 Family history of ischemic heart disease and other diseases of the circulatory system: Secondary | ICD-10-CM | POA: Diagnosis not present

## 2017-09-28 DIAGNOSIS — M109 Gout, unspecified: Secondary | ICD-10-CM | POA: Diagnosis not present

## 2017-09-28 DIAGNOSIS — E785 Hyperlipidemia, unspecified: Secondary | ICD-10-CM | POA: Insufficient documentation

## 2017-09-28 DIAGNOSIS — N184 Chronic kidney disease, stage 4 (severe): Secondary | ICD-10-CM | POA: Diagnosis not present

## 2017-09-28 DIAGNOSIS — E1122 Type 2 diabetes mellitus with diabetic chronic kidney disease: Secondary | ICD-10-CM | POA: Insufficient documentation

## 2017-09-28 DIAGNOSIS — Z7901 Long term (current) use of anticoagulants: Secondary | ICD-10-CM | POA: Insufficient documentation

## 2017-09-28 DIAGNOSIS — I130 Hypertensive heart and chronic kidney disease with heart failure and stage 1 through stage 4 chronic kidney disease, or unspecified chronic kidney disease: Secondary | ICD-10-CM | POA: Insufficient documentation

## 2017-09-28 LAB — BASIC METABOLIC PANEL
Anion gap: 12 (ref 5–15)
BUN: 28 mg/dL — ABNORMAL HIGH (ref 6–20)
CALCIUM: 8.9 mg/dL (ref 8.9–10.3)
CO2: 29 mmol/L (ref 22–32)
CREATININE: 1.74 mg/dL — AB (ref 0.61–1.24)
Chloride: 94 mmol/L — ABNORMAL LOW (ref 101–111)
GFR, EST AFRICAN AMERICAN: 42 mL/min — AB (ref >60)
GFR, EST NON AFRICAN AMERICAN: 36 mL/min — AB (ref >60)
Glucose, Bld: 218 mg/dL — ABNORMAL HIGH (ref 65–99)
Potassium: 3.8 mmol/L (ref 3.5–5.1)
SODIUM: 135 mmol/L (ref 135–145)

## 2017-09-28 LAB — DIGOXIN LEVEL: Digoxin Level: <0.2 ng/mL — ABNORMAL LOW (ref 0.8–2.0)

## 2017-09-28 MED ORDER — ALLOPURINOL 100 MG PO TABS: 100.0000 mg | ORAL_TABLET | Freq: Every day | ORAL | 2 refills | Status: AC

## 2017-09-28 NOTE — Progress Notes (Signed)
Patient ID: Aristotle Lieb, male   DOB: 07-12-1941, 76 y.o.   MRN: [id number]   ADVANCED HF CLINIC NOTE PCP: [place] Primary cardiologist: Dr. Stanford Breed EP: Dr Caryl Comes  Mr. Prokop is a 76  yo with history of CAD s/p CABG 2000, ischemic cardiomyopathy (20%) with St Jude CRT-D, DM2, paroxysmal atrial fibrillation, and recurrent syncope of uncertain etiology.    He had RHC/LHC in 4/15 showing patent grafts but elevated filling pressures and low cardiac index (1.9 Fick/2.2 thermo).   Follow up for Heart Failure: He returns for HF follow up with his wife. Overall feeling pretty well. Saw Dr. Caryl Comes last week and labs drawn. Creatinine climbing up slowly.  1.5-> 1.7 -> 1.9. Still drinks a lot of water. Follows with Nephrology in [place]. Weight up 168-172. Six months ago was 159-165. Says he is eating a lot and not exercising as much. Does has an exercise bike but can only do 10 mins. Says he gets SOB any time he tries to do anything extra like taking garbage can out. Also SOB with showering. Denies edema, orthopnea, PND. Taking metolazone about once a week for increased weight.   ICD interrogated personally No VT/AF  Fluid up and down. Slightly up today.   Labs 10/17: K 4.7 Cr 1.5  ECG: AV paced 60  ECHO 04/16/2016: EF 20% IVC normal. RV mildly reduced Echo 8/15: EF 15-20% RV moderate dysfunction.   [place] 04/2016 RA = 7 RV = 48/2/8 PA = 54/20 (33) PCW = 19 Fick cardiac output/index = 4.2/2.4 PVR = 3.3 WU Ao sat = 93% PA sat = 57%, 60%  Last cath 4/15 RA = 9 RV = 69/0/13 PA = 61/24 (38) PCW = 29 Fick cardiac output/index = 3.4/1.9 Thermo CO/CI = 4.1 /2.2 PVR = 2.7 [place] FA sat = 94% PA sat = 57%, 57% Ao Pressure: 104/58 (77) LV Pressure: 103/13/29  Left main: Mild plaque LAD: Totally occluded ostially LCX: Small ramus with high-grade ostial disease. AV groove LCX appears occluded in mid section. OM-1 occluded proximally. RCA: Unable to be cannulated.  Suspect occluded ostially.  LV-gram done in the RAO projection: Ejection fraction = 15%. Basal inferior and anterior walls only sections that contract.  LIMA to LAD: Patent SVG to Ramus: Patent SVG to OM-3: Patent SVG to R PDA: Patent. The RCA is occluded prior to take-off of PDA. There are collaterals from distal RCA to OM-1 and probably a septal perforator.  CPX 04/24/14: FVC 2.70 (78%)  FEV1 2.15 (86%)  FEV1/FVC 80%  MVV 84 (80%) Resting HR: 65 Peak HR: 134 (92% age predicted max HR) Peak VO2: 19.6 (76.1% predicted peak VO2) VE/VCO2 slope: 34.8 OUES: 1.58 Peak RER: 1.23 Ventilatory Threshold: 15.0 (58.3% predicted peak VO2) Peak RR 46 Peak Ventilation: 68.1 VE/MVV: 81% PETCO2 at peak: 30 O2pulse: 11 (76% predicted O2pulse)  Labs (4/15): K 4.7, creatinine 1.4 Labs (5/15): K 3.7 Cr 1.56 pBNP 1,353 Labs (8/15): K 4.8, creatinine 1.6, pBNP 724 Labs (11/13/2014): K 3.6 Creatinine 1.79 dig level 0.5   PMH: 1. HTN 2. Hyperlipidemia 3. CAD: s/p CABG with LIMA-LAD, SVG-ramus, SVG-OM3, SVG-PDA.  LHC (4/15) with patent grafts.  4. Ischemic cardiomyopathy: Echo (8/14) with EF 20-25% with regional WMAs, mild MR, mildly decreased RV systolic function.  EF LV-gram (4/15) was 15%. He has a [employer], political party CRT-D device (upgraded 1/14).  RHC (4/15): mean RA 9, PA 61/24 (mean 38), mean PCWP 29, CI 1.9 Fick/2.2 thermo, PVR 2.7. CPX (04/2014): Peak  VO2: 19.6 (76% predicted peak VO2), VE/VCO2: 34.8, Peak RER 1.23.  Echo (10/15) with EF 20-25% with regional wall motion abnormalities, normal RV size with mild to moderately decreased systolic function, mild AS, mild MR.  5. Syncopal episodes: Relatively frequent, etiology uncertain.  He does get lightheaded/near-syncopal with coughing spells, but he has syncope at other times also with no warning.  Since device has been in place, he has had VT detected but it has not been clearly correlated with syncopal episodes. Suspect vasomotor.  6. Atrial fibrillation:  Paroxysmal.  Has not been anticoagulated due to relatively frequent syncope/falls.  7. Gout 8. H/o LBBB 9. Type II diabetes 10. CKD  SH: Married, lives in [place], nonsmoker, rare ETOH.   FH: CAD  ROS: All systems reviewed and negative except as per HPI.   Current Outpatient Prescriptions  Medication Sig Dispense Refill  . acetaminophen (TYLENOL) 500 MG tablet Take 500 mg by mouth every 6 (six) hours as needed. For pain    . albuterol (PROVENTIL HFA;VENTOLIN HFA) 108 (90 Base) MCG/ACT inhaler Inhale 2 puffs into the lungs every 4 (four) hours as needed for wheezing. 1 Inhaler 6  . allopurinol (ZYLOPRIM) 100 MG tablet Take 1 tablet (100 mg total) by mouth daily. (Patient taking differently: Take 100 mg by mouth daily at 8 pm. (1900)) 30 tablet 2  . calcium carbonate (OS-CAL) 600 MG TABS Take 600 mg by mouth daily after breakfast.     . Carboxymethylcellulose Sodium (THERATEARS) 0.25 % SOLN Place 1 drop into both eyes 3 (three) times daily as needed (for dry eyes.).    Marland Kitchen carvedilol (COREG) 3.125 MG tablet TAKE 1 TABLET BY MOUTH TWICE DAILY WITH MEALS (Patient taking differently: TAKE 1 TABLET BY MOUTH TWICE DAILY WITH MEALS (0700 & 2100)) 60 tablet 11  . colchicine 0.6 MG tablet Take 0.6 mg by mouth daily as needed (gout flare up).     . digoxin (LANOXIN) 0.125 MG tablet TAKE 1 TABLET (0.125 MG TOTAL) BY MOUTH DAILY. (Patient taking differently: TAKE 1 TABLET (0.125 MG TOTAL) BY MOUTH DAILY IN THE EVENING. (2100)) 90 tablet 3  . ELIQUIS 5 MG TABS tablet TAKE 1 TABLET BY MOUTH TWICE A DAY (Patient taking differently: TAKE 1 TABLET BY MOUTH TWICE A DAY (0700 & 2100)) 60 tablet 3  . famotidine (PEPCID) 10 MG tablet Take 10 mg by mouth daily before supper. 2 HRS BEFORE SUPPER.    Marland Kitchen FLUoxetine (PROZAC) 20 MG capsule Take 1 capsule (20 mg total) by mouth daily. -- Office visit needed for further refills 90 capsule 0  . furosemide (LASIX) 80 MG tablet Take 1 tablet (80 mg total) by mouth 2 (two)  times daily. (Patient taking differently: Take 80 mg by mouth 2 (two) times daily. (0700 & 1900)) 60 tablet 6  . ibuprofen (ADVIL,MOTRIN) 200 MG tablet Take 200 mg by mouth every 8 (eight) hours as needed (for pain.).    Marland Kitchen insulin glargine (LANTUS) 100 UNIT/ML injection Inject 80 Units into the skin daily at 10 pm. (2100)    . loratadine (CLARITIN) 10 MG tablet Take 10 mg by mouth daily as needed. For allergies    . metolazone (ZAROXOLYN) 2.5 MG tablet Take 1 tablet (2.5 mg total) by mouth daily as needed. For fluid or edema 30 tablet 3  . Multiple Vitamin (MULTIVITAMIN) tablet Take 1 tablet by mouth daily after breakfast.     . omega-3 acid ethyl esters (LOVAZA) 1 G capsule Take 2 g by mouth  2 (two) times daily. (0700 & 1900)    . potassium chloride SA (K-DUR,KLOR-CON) 20 MEQ tablet Take 2 tablets (40 mEq total) by mouth daily. (Patient taking differently: Take 20 mEq by mouth 2 (two) times daily. (0700 & 1900)) 180 tablet 3  . sacubitril-valsartan (ENTRESTO) 49-51 MG Take 1 tablet by mouth 2 (two) times daily. (Patient taking differently: Take 1 tablet by mouth 2 (two) times daily. (0700 & 1900)) 60 tablet 6  . spironolactone (ALDACTONE) 25 MG tablet Take 25 mg by mouth daily. (1900)     No current facility-administered medications for this encounter.     BP 110/68   Pulse 66   Wt 176 lb 12.8 oz (80.2 kg)   SpO2 96%   BMI 29.42 kg/m  General:  Well appearing. No resp difficulty HEENT: normal Neck: supple. no JVD. Carotids 2+ bilat; no bruits. No lymphadenopathy or thryomegaly appreciated. Cor: PMI laterally displaced. Regular rate & rhythm. No rubs, gallops or murmurs. Lungs: clear Abdomen: soft, nontender, nondistended. No hepatosplenomegaly. No bruits or masses. Good bowel sounds. Extremities: no cyanosis, clubbing, rash, edema Neuro: alert & orientedx3, cranial nerves grossly intact. moves all 4 extremities w/o difficulty. Affect pleasant   Assessment/Plan: 1. Chronic systolic  CHF: Ischemic cardiomyopathy s/p St Jude CRT-D, EF 20%(04/2016 )  -He is clearly worse with progressive NYHA III symptoms in the setting of very low EF and 10 pound weight gain despite very careful management of his diuretics. Fluid status up and down on Optivol but not too bad today. I worry about low output - Will plan repeat CPX and echo - We discussed possible need for VAD w/u.  - Will continue lasix 80 mg BID and continue metolazone 2.5 as needed - Continue Entresto to 49/51. Repeat labs today. If creatinine continues to climb may have to increase dry weight target a bit,  - Continue spironolactone, digoxin and Coreg.  2. CAD:  - Doing well. No ischemic symptoms -Patent grafts on LHC in 4/15. Continue statin.  With Eliquis so no longer taking aspirin. Can drop dose to 2.5 bid when he turns 79    3. Paroxysmal atrial fibrillation:  --ICD interrogated personally. No PAF --Continue eliquis 5 mg twice a day.  4. H/o Recurrent syncope  --Suspect cough syncope. Had another episode 2 weeks ago but recovered quickly. No events on ICD. Now stable 5. ICD  --Follows with Dr. Caryl Comes. Recently had gen change 6. CKD, III-IV - Creatinine running a little higher. Suspect he may be running himself a little dry versus low output - Will repeat today  Total time spent 35 minutes. Over half that time spent discussing above.    Glori Bickers MD 09/28/2017

## 2017-09-28 NOTE — Telephone Encounter (Signed)
lmtcb for results. 

## 2017-09-28 NOTE — Patient Instructions (Addendum)
Labs today  Your physician has requested that you have an echocardiogram. Echocardiography is a painless test that uses sound waves to create images of your heart. It provides your doctor with information about the size and shape of your heart and how well your heart's chambers and valves are working. This procedure takes approximately one hour. There are no restrictions for this procedure.  Your physician has recommended that you have a cardiopulmonary stress test (CPX). CPX testing is a non-invasive measurement of heart and lung function. It replaces a traditional treadmill stress test. This type of test provides a tremendous amount of information that relates not only to your present condition but also for future outcomes. This test combines measurements of you ventilation, respiratory gas exchange in the lungs, electrocardiogram (EKG), blood pressure and physical response before, during, and following an exercise protocol.  Your physician recommends that you schedule a follow-up appointment in: 2 months

## 2017-09-29 ENCOUNTER — Other Ambulatory Visit: Payer: Self-pay

## 2017-09-29 ENCOUNTER — Telehealth: Payer: Self-pay

## 2017-09-29 DIAGNOSIS — I5022 Chronic systolic (congestive) heart failure: Secondary | ICD-10-CM

## 2017-09-29 NOTE — Telephone Encounter (Signed)
Pt is aware and agreeable to results. Per patient and current med req list pt is only taking metalazone PRN and is taking lasix 80 mg BID. Pt is agreeable to continuing Lasix 80 mg BID and STOPPING PRN Metalazone and repeat BMET on 10/19/17.

## 2017-10-12 ENCOUNTER — Encounter (HOSPITAL_COMMUNITY): Payer: Medicare Other

## 2017-10-12 ENCOUNTER — Ambulatory Visit (HOSPITAL_COMMUNITY): Payer: Medicare Other

## 2017-10-19 ENCOUNTER — Other Ambulatory Visit: Payer: Self-pay | Admitting: Internal Medicine

## 2017-10-19 ENCOUNTER — Other Ambulatory Visit: Payer: Medicare Other | Admitting: *Deleted

## 2017-10-19 DIAGNOSIS — I5022 Chronic systolic (congestive) heart failure: Secondary | ICD-10-CM

## 2017-10-19 LAB — BASIC METABOLIC PANEL
BUN/Creatinine Ratio: 16 (ref 10–24)
BUN: 34 mg/dL — ABNORMAL HIGH (ref 8–27)
CO2: 26 mmol/L (ref 20–29)
CREATININE: 2.18 mg/dL — AB (ref 0.76–1.27)
Calcium: 9.8 mg/dL (ref 8.6–10.2)
Chloride: 89 mmol/L — ABNORMAL LOW (ref 96–106)
GFR, EST AFRICAN AMERICAN: 33 mL/min/{1.73_m2} — AB (ref >59)
GFR, EST NON AFRICAN AMERICAN: 28 mL/min/{1.73_m2} — AB (ref >59)
Glucose: 201 mg/dL — ABNORMAL HIGH (ref 65–99)
Potassium: 4.1 mmol/L (ref 3.5–5.2)
SODIUM: 135 mmol/L (ref 134–144)

## 2017-10-20 ENCOUNTER — Telehealth: Payer: Self-pay

## 2017-10-20 ENCOUNTER — Other Ambulatory Visit: Payer: Self-pay

## 2017-10-20 DIAGNOSIS — I5022 Chronic systolic (congestive) heart failure: Secondary | ICD-10-CM

## 2017-10-20 DIAGNOSIS — I480 Paroxysmal atrial fibrillation: Secondary | ICD-10-CM

## 2017-10-20 NOTE — Telephone Encounter (Signed)
Pt is aware and agreeable to elevated labs. He is agreeable to holding his lasix x 2 days and resuming at 80 mg QD. Repeat BMET will be Monday morning.

## 2017-10-20 NOTE — Telephone Encounter (Signed)
Spoke with pts wife (per DPR) and informed her that per a conversation between Dr. Caryl Comes and Dr. Haroldine Laws, Dr. Haroldine Laws would prefer the patient resume his dose at 80 mg in the morning and 40 mg in the evening of his lasix after the 2 day hold. Pt's wife is agreeable and will relay the message to patient.

## 2017-10-23 ENCOUNTER — Ambulatory Visit (INDEPENDENT_AMBULATORY_CARE_PROVIDER_SITE_OTHER): Payer: Medicare Other

## 2017-10-23 DIAGNOSIS — Z95810 Presence of automatic (implantable) cardiac defibrillator: Secondary | ICD-10-CM

## 2017-10-23 DIAGNOSIS — I5022 Chronic systolic (congestive) heart failure: Secondary | ICD-10-CM | POA: Diagnosis not present

## 2017-10-23 NOTE — Progress Notes (Signed)
EPIC Encounter for ICM Monitoring  Patient Name: Mason Arnold is a 76 y.o. male Date: 10/23/2017 Primary Care Physican: Binnie Rail, MD Primary Cardiologist:[facility]/ HF clinic [place] Electrophysiologist: Caryl Comes Nephrologist: [facility] Weight:169 lbs Bi-V Pacing: >99%      Heart Failure questions reviewed, pt is still 4 pounds over baseline.    Thoracic impedance normal but was abnormal suggesting fluid accumulation from 10/05/17 to 10/17/17 (addressed by HF clinic).  Prescribed dosage: Furosemide 80 mg in the morning and 40 mg in the evening per 10/20/2017 note of CMA, Detavia Kenan (med list not updated).  Potassium 20 mEq 1 tablet twice daily. Spironolactone 25 mg 1 tablet daily.  Asked patient if Metolazone has been stopped and he said HF clinic stopped the medication but [place] nephrologist has said he may continue to take as needed.  Advised to follow instructions given by physician.     Labs: 10/19/2017 Creatinine 2.18, BUN 34, Potassium 4.1, Sodium 135, EGFR 28-33 09/28/2017 Creatinine 1.74, BUN 28, Potassium 3.8, Sodium 135, EGFR 36-42  09/22/2017 Creatinine 1.98, BUN 36, Potassium 3.7, Sodium 136, EGFR 32-37  08/07/2017 Creatinine 1.71, BUN 28, Potassium 3.8, Sodium 137, EGFR 37-43 06/05/2017 Creatinine 1.59, BUN 33, Potassium 3.5, Sodium 134, EGFR 41-48 03/18/2017 Creatinine 1.52, BUN 23, Potassium 4.2, Sodium 135, EGFR 42-49 02/27/2017 Creatinine 1.63, BUN 17, Potassium 3.4, Sodium 136, EGFR 39-45 12/23/2016 Creatinine 1.27, BUN 15, Potassium 4.3, Sodium 137, EGFR 53->60 08/13/2016 Creatinine 1.36, BUN 21, Potassium 4.1, Sodium 135, EGFR 49-57  04/02/2016 Creatinine 1.51, BUN 17, Potassium 4.6, Sodium 136, EGFR 43-50   Recommendations: Patient reported feeling fine at this time.   Follow-up plan: ICM clinic phone appointment on 11/23/2017.    Copy of ICM check sent to Dr. Caryl Comes.   3 month ICM trend: 10/23/2017    1 Year ICM trend:       Rosalene Billings, RN 10/23/2017 8:42 AM

## 2017-10-26 ENCOUNTER — Ambulatory Visit (HOSPITAL_COMMUNITY): Payer: Medicare Other

## 2017-10-26 ENCOUNTER — Other Ambulatory Visit (HOSPITAL_COMMUNITY): Payer: Self-pay | Admitting: *Deleted

## 2017-10-26 ENCOUNTER — Ambulatory Visit (HOSPITAL_COMMUNITY)
Admission: RE | Admit: 2017-10-26 | Discharge: 2017-10-26 | Disposition: A | Discharge status: Home / Self Care | Payer: Medicare Other | Source: Ambulatory Visit | Attending: Cardiology | Admitting: Cardiology

## 2017-10-26 ENCOUNTER — Other Ambulatory Visit (HOSPITAL_COMMUNITY): Payer: Self-pay

## 2017-10-26 ENCOUNTER — Other Ambulatory Visit: Payer: Medicare Other

## 2017-10-26 ENCOUNTER — Ambulatory Visit (HOSPITAL_COMMUNITY)
Admission: RE | Admit: 2017-10-26 | Discharge: 2017-10-26 | Disposition: A | Discharge status: Home / Self Care | Payer: Medicare Other | Source: Ambulatory Visit | Attending: Internal Medicine | Admitting: Internal Medicine

## 2017-10-26 DIAGNOSIS — I110 Hypertensive heart disease with heart failure: Secondary | ICD-10-CM | POA: Diagnosis not present

## 2017-10-26 DIAGNOSIS — R29898 Other symptoms and signs involving the musculoskeletal system: Secondary | ICD-10-CM | POA: Diagnosis not present

## 2017-10-26 DIAGNOSIS — I472 Ventricular tachycardia: Secondary | ICD-10-CM | POA: Insufficient documentation

## 2017-10-26 DIAGNOSIS — I5022 Chronic systolic (congestive) heart failure: Secondary | ICD-10-CM

## 2017-10-26 DIAGNOSIS — I4891 Unspecified atrial fibrillation: Secondary | ICD-10-CM | POA: Diagnosis not present

## 2017-10-26 DIAGNOSIS — E785 Hyperlipidemia, unspecified: Secondary | ICD-10-CM | POA: Diagnosis not present

## 2017-10-26 DIAGNOSIS — I081 Rheumatic disorders of both mitral and tricuspid valves: Secondary | ICD-10-CM | POA: Diagnosis not present

## 2017-10-26 LAB — BASIC METABOLIC PANEL
Anion gap: 12 (ref 5–15)
BUN: 15 mg/dL (ref 6–20)
CALCIUM: 9.2 mg/dL (ref 8.9–10.3)
CHLORIDE: 100 mmol/L — AB (ref 101–111)
CO2: 24 mmol/L (ref 22–32)
CREATININE: 1.78 mg/dL — AB (ref 0.61–1.24)
GFR calc Af Amer: 41 mL/min — ABNORMAL LOW (ref >60)
GFR calc non Af Amer: 35 mL/min — ABNORMAL LOW (ref >60)
Glucose, Bld: 138 mg/dL — ABNORMAL HIGH (ref 65–99)
Potassium: 4.7 mmol/L (ref 3.5–5.1)
Sodium: 136 mmol/L (ref 135–145)

## 2017-10-26 NOTE — Progress Notes (Signed)
  Echocardiogram 2D Echocardiogram has been performed.  Mason Arnold L Androw 10/26/2017, 8:42 AM

## 2017-11-06 LAB — HM DIABETES EYE EXAM

## 2017-11-23 ENCOUNTER — Ambulatory Visit (INDEPENDENT_AMBULATORY_CARE_PROVIDER_SITE_OTHER): Payer: Medicare Other

## 2017-11-23 DIAGNOSIS — Z95810 Presence of automatic (implantable) cardiac defibrillator: Secondary | ICD-10-CM | POA: Diagnosis not present

## 2017-11-23 DIAGNOSIS — I5022 Chronic systolic (congestive) heart failure: Secondary | ICD-10-CM | POA: Diagnosis not present

## 2017-11-24 NOTE — Progress Notes (Signed)
EPIC Encounter for ICM Monitoring  Patient Name: Mason Arnold is a 76 y.o. male Date: 11/24/2017 Primary Care Physican: Binnie Rail, MD  Primary Cardiologist:[facility]/ HF clinic [place] Electrophysiologist: Caryl Comes Nephrologist: [facility] Weight:167.2 lbs Bi-V Pacing: >99%      Heart Failure questions reviewed, pt asymptomatic   Thoracic impedance normal since 11/02/2017.  Prescribed dosage: Furosemide 80 mg in the morning and 40 mg in the evening  Labs: 10/26/2017 Creatinine 1.78, BUN 15, Potassium 4.7, Sodium 136, EGFR 35-41 10/19/2017 Creatinine 2.18, BUN 34, Potassium 4.1, Sodium 135, EGFR 28-33 09/28/2017 Creatinine 1.74, BUN 28, Potassium 3.8, Sodium 135, EGFR 36-42  09/22/2017 Creatinine 1.98, BUN 36, Potassium 3.7, Sodium 136, EGFR 32-37  08/07/2017 Creatinine 1.71, BUN 28, Potassium 3.8, Sodium 137, EGFR 37-43 06/05/2017 Creatinine 1.59, BUN 33, Potassium 3.5, Sodium 134, EGFR 41-48 03/18/2017 Creatinine 1.52, BUN 23, Potassium 4.2, Sodium 135, EGFR 42-49 02/27/2017 Creatinine 1.63, BUN 17, Potassium 3.4, Sodium 136, EGFR 39-45 12/23/2016 Creatinine 1.27, BUN 15, Potassium 4.3, Sodium 137, EGFR 53->60 08/13/2016 Creatinine 1.36, BUN 21, Potassium 4.1, Sodium 135, EGFR 49-57  04/02/2016 Creatinine 1.51, BUN 17, Potassium 4.6, Sodium 136, EGFR 43-50   Recommendations: No changes.  Encouraged to call for fluid symptoms.  Follow-up plan: ICM clinic phone appointment on 12/24/2017.    Copy of ICM check sent to Dr. Caryl Comes.   3 month ICM trend: 11/23/2017    1 Year ICM trend:       Rosalene Billings, RN 11/24/2017 5:17 PM

## 2017-12-10 NOTE — Progress Notes (Signed)
Patient ID: Mason Arnold, male   DOB: 05-04-1941, 77 y.o.   MRN: [id number]   ADVANCED HF CLINIC NOTE PCP: [place] Primary cardiologist: Dr. Stanford Breed EP: Dr Caryl Comes  Mason Arnold is a 77  yo with history of CAD s/p CABG 2000, ischemic cardiomyopathy (20%) with [employer] Jude CRT-D, DM2, paroxysmal atrial fibrillation, and recurrent syncope of uncertain etiology.    He had RHC/LHC in 4/15 showing patent grafts but elevated filling pressures and low cardiac index (1.9 Fick/2.2 thermo).   Follow up for Heart Failure: He returns for HF follow up with his wife. Over the past few months, creatinine climbing up slowly.  1.5-> 1.7 -> 1.9 -> 2.2 but was actually back down to 1.8 in 11/18. Last visit had progressive HF symptoms and echo and CPX ordered.  Echo 10/26/17 EF stable at 25-30% Personally reviewed CPX:  10/26/17 showed mild to moderate HF limitation. Ventilatory limited at peak (see below)  Here today with his wife for f/u. Says blood sugars are much better. Follows with Nephrology in [place]. Weight steady 166-170.  Not exercising very much. Putters around the yard. Gets SOB with mild exertion. No CP. No edema, orthopnea or PND. Not taking metolazone at all currently. Taking lasix 80/40. If he turns his head to the right can get dizzy  ICD interrogated personally No VT/AF. Corevue flat and well compensated   CPX 11/18  FVC 2.77 (78%)    FEV1 2.14 (85%)     FEV1/FVC 77 (101%)      MVV 82 (81%) Resting HR: 64 Peak HR: 128  (90% age predicted max HR) BP rest: 100/64 BP peak: 126/60  Peak VO2: 18.5 (76% predicted peak VO2) VE/VCO2 slope: 37  OUES: 1.43 Peak RER: 1.78 Ventilatory Threshold: 14.9 (67% predicted or measured peak VO2) VE/MVV: 74% O2pulse: 12  (100% predicted O2pulse)  Echo 11/18: EF 25-30% ECHO 04/16/2016: EF 20% IVC normal. RV mildly reduced Echo 8/15: EF 15-20% RV moderate dysfunction.   [place] 04/2016 RA = 7 RV = 48/2/8 PA =  54/20 (33) PCW = 19 Fick cardiac output/index = 4.2/2.4 PVR = 3.3 WU Ao sat = 93% PA sat = 57%, 60%  Last cath 4/15 RA = 9 RV = 69/0/13 PA = 61/24 (38) PCW = 29 Fick cardiac output/index = 3.4/1.9 Thermo CO/CI = 4.1 /2.2 PVR = 2.7 [place] FA sat = 94% PA sat = 57%, 57% Ao Pressure: 104/58 (77) LV Pressure: 103/13/29  Left main: Mild plaque LAD: Totally occluded ostially LCX: Small ramus with high-grade ostial disease. AV groove LCX appears occluded in mid section. OM-1 occluded proximally. RCA: Unable to be cannulated. Suspect occluded ostially.  LV-gram done in the RAO projection: Ejection fraction = 15%. Basal inferior and anterior walls only sections that contract.  LIMA to LAD: Patent SVG to Ramus: Patent SVG to OM-3: Patent SVG to R PDA: Patent. The RCA is occluded prior to take-off of PDA. There are collaterals from distal RCA to OM-1 and probably a septal perforator.  CPX 04/24/14: FVC 2.70 (78%)  FEV1 2.15 (86%)  FEV1/FVC 80%  MVV 84 (80%) Resting HR: 65 Peak HR: 134 (92% age predicted max HR) Peak VO2: 19.6 (76.1% predicted peak VO2) VE/VCO2 slope: 34.8 OUES: 1.58 Peak RER: 1.23 Ventilatory Threshold: 15.0 (58.3% predicted peak VO2) Peak RR 46 Peak Ventilation: 68.1 VE/MVV: 81% PETCO2 at peak: 30 O2pulse: 11 (76% predicted O2pulse)  Labs (4/15): K 4.7, creatinine 1.4 Labs (5/15): K 3.7 Cr 1.56 pBNP 1,353  Labs (8/15): K 4.8, creatinine 1.6, pBNP 724 Labs (11/13/2014): K 3.6 Creatinine 1.79 dig level 0.5   PMH: 1. HTN 2. Hyperlipidemia 3. CAD: s/p CABG with LIMA-LAD, SVG-ramus, SVG-OM3, SVG-PDA.  LHC (4/15) with patent grafts.  4. Ischemic cardiomyopathy: Echo (8/14) with EF 20-25% with regional WMAs, mild MR, mildly decreased RV systolic function.  EF LV-gram (4/15) was 15%. He has a Research officer, political party CRT-D device (upgraded 1/14).  RHC (4/15): mean RA 9, PA 61/24 (mean 38), mean PCWP 29, CI 1.9 Fick/2.2 thermo, PVR 2.7. CPX (04/2014): Peak VO2: 19.6 (76% predicted  peak VO2), VE/VCO2: 34.8, Peak RER 1.23.  Echo (10/15) with EF 20-25% with regional wall motion abnormalities, normal RV size with mild to moderately decreased systolic function, mild AS, mild MR.  5. Syncopal episodes: Relatively frequent, etiology uncertain.  He does get lightheaded/near-syncopal with coughing spells, but he has syncope at other times also with no warning.  Since device has been in place, he has had VT detected but it has not been clearly correlated with syncopal episodes. Suspect vasomotor.  6. Atrial fibrillation: Paroxysmal.  Has not been anticoagulated due to relatively frequent syncope/falls.  7. Gout 8. H/o LBBB 9. Type II diabetes 10. CKD  SH: Married, lives in [place], nonsmoker, rare ETOH.   FH: CAD  ROS: All systems reviewed and negative except as per HPI.   Current Outpatient Medications  Medication Sig Dispense Refill  . acetaminophen (TYLENOL) 500 MG tablet Take 500 mg by mouth every 6 (six) hours as needed. For pain    . albuterol (PROVENTIL HFA;VENTOLIN HFA) 108 (90 Base) MCG/ACT inhaler Inhale 2 puffs into the lungs every 4 (four) hours as needed for wheezing. 1 Inhaler 6  . allopurinol (ZYLOPRIM) 100 MG tablet Take 1 tablet (100 mg total) by mouth daily at 8 pm. (1900) 30 tablet 2  . calcium carbonate (OS-CAL) 600 MG TABS Take 600 mg by mouth daily after breakfast.     . Carboxymethylcellulose Sodium (THERATEARS) 0.25 % SOLN Place 1 drop into both eyes 3 (three) times daily as needed (for dry eyes.).    Marland Kitchen carvedilol (COREG) 3.125 MG tablet TAKE 1 TABLET BY MOUTH TWICE DAILY WITH MEALS (Patient taking differently: TAKE 1 TABLET BY MOUTH TWICE DAILY WITH MEALS (0700 & 2100)) 60 tablet 11  . colchicine 0.6 MG tablet Take 0.6 mg by mouth daily as needed (gout flare up).     . digoxin (LANOXIN) 0.125 MG tablet TAKE 1 TABLET (0.125 MG TOTAL) BY MOUTH DAILY. (Patient taking differently: TAKE 1 TABLET (0.125 MG TOTAL) BY MOUTH DAILY IN THE EVENING. (2100)) 90  tablet 3  . ELIQUIS 5 MG TABS tablet TAKE 1 TABLET BY MOUTH TWICE A DAY (Patient taking differently: TAKE 1 TABLET BY MOUTH TWICE A DAY (0700 & 2100)) 60 tablet 3  . famotidine (PEPCID) 10 MG tablet Take 10 mg by mouth daily before supper. 2 HRS BEFORE SUPPER.    Marland Kitchen FLUoxetine (PROZAC) 20 MG capsule Take 1 capsule (20 mg total) by mouth daily. -- Office visit needed for further refills 90 capsule 0  . furosemide (LASIX) 80 MG tablet Take 1 tablet (80 mg total) by mouth 2 (two) times daily. (Patient taking differently: Take 80 mg by mouth 2 (two) times daily. (0700 & 1900)) 60 tablet 6  . ibuprofen (ADVIL,MOTRIN) 200 MG tablet Take 200 mg by mouth every 8 (eight) hours as needed (for pain.).    Marland Kitchen insulin glargine (LANTUS) 100 UNIT/ML injection Inject 80 Units  into the skin daily at 10 pm. (2100)    . loratadine (CLARITIN) 10 MG tablet Take 10 mg by mouth daily as needed. For allergies    . Multiple Vitamin (MULTIVITAMIN) tablet Take 1 tablet by mouth daily after breakfast.     . omega-3 acid ethyl esters (LOVAZA) 1 G capsule Take 2 g by mouth 2 (two) times daily. (0700 & 1900)    . potassium chloride SA (K-DUR,KLOR-CON) 20 MEQ tablet Take 2 tablets (40 mEq total) by mouth daily. (Patient taking differently: Take 20 mEq by mouth 2 (two) times daily. (0700 & 1900)) 180 tablet 3  . sacubitril-valsartan (ENTRESTO) 49-51 MG Take 1 tablet by mouth 2 (two) times daily. (Patient taking differently: Take 1 tablet by mouth 2 (two) times daily. (0700 & 1900)) 60 tablet 6  . spironolactone (ALDACTONE) 25 MG tablet Take 25 mg by mouth daily. (1900)     No current facility-administered medications for this encounter.     BP 102/60   Pulse 67   Wt 176 lb 12.8 oz (80.2 kg)   SpO2 94%   BMI 29.42 kg/m  General:  Well appearing. No resp difficulty HEENT: normal Neck: supple. no JVD. Carotids 2+ bilat; no bruits. No lymphadenopathy or thryomegaly appreciated. Cor: PMI nondisplaced. Regular rate & rhythm. No  rubs, gallops or murmurs. Lungs: clear Abdomen: obese soft, nontender, nondistended. No hepatosplenomegaly. No bruits or masses. Good bowel sounds. Extremities: no cyanosis, clubbing, rash, edema Neuro: alert & orientedx3, cranial nerves grossly intact. moves all 4 extremities w/o difficulty. Affect pleasant    Assessment/Plan: 1. Chronic systolic CHF: Ischemic cardiomyopathy s/p St Jude CRT-D, EF 20%(04/2016 )  -Echo 11/18 EF stable 25-30% -CPX 11/18 Peak VO2: 18.5 (76% predicted peak VO2) VE/VCO2 slope: 37  - CPX test reviewed with him and his wife personally. Only mild to moderate HF limitation. Reassuring. - ICD interrogated personally No VT/AF Volume ok  - Volume status much improved continue lasix 80 mg BID. Limit metolazone due to recent AKI - Continue Entresto to 49/51.  - Continue spironolactone, digoxin and Coreg. BP too soft to titrate - Repeat labs today 2. CAD:  - Doing well. No s/s ischemia -Patent grafts on LHC in 4/15. Continue statin.  With Eliquis so no longer taking aspirin. Can drop dose to 2.5 bid when he turns 79    3. Paroxysmal atrial fibrillation:   -ICD interrogated personally. No PAF --Continue eliquis 5 mg twice a day.  4. H/o Recurrent syncope  --Suspect cough syncope. Had another episode 2 weeks ago but recovered quickly. No events on ICD. Now stable 5. ICD  --Follows with Dr. Caryl Comes.  6. CKD, III-IV - Creatinine improved on last check. Will repeat today. Limit metolazone.  - Will repeat today 7. Dizziness on turning head to right - No carotid bruits or hypersensitvity on exam Will check carotid u/s. 8. DM2 - followed by PCP - consider Lottie Rater MD 12/10/2017

## 2017-12-11 ENCOUNTER — Encounter (HOSPITAL_COMMUNITY): Payer: Self-pay | Admitting: Internal Medicine

## 2017-12-11 ENCOUNTER — Ambulatory Visit (HOSPITAL_COMMUNITY)
Admission: RE | Admit: 2017-12-11 | Discharge: 2017-12-11 | Disposition: A | Discharge status: Home / Self Care | Payer: Medicare Other | Source: Ambulatory Visit | Attending: Internal Medicine | Admitting: Internal Medicine

## 2017-12-11 VITALS — BP 102/60 | HR 67 | Wt 176.8 lb

## 2017-12-11 DIAGNOSIS — I255 Ischemic cardiomyopathy: Secondary | ICD-10-CM | POA: Diagnosis not present

## 2017-12-11 DIAGNOSIS — R55 Syncope and collapse: Secondary | ICD-10-CM | POA: Diagnosis not present

## 2017-12-11 DIAGNOSIS — R42 Dizziness and giddiness: Secondary | ICD-10-CM | POA: Diagnosis not present

## 2017-12-11 DIAGNOSIS — E1122 Type 2 diabetes mellitus with diabetic chronic kidney disease: Secondary | ICD-10-CM | POA: Diagnosis not present

## 2017-12-11 DIAGNOSIS — Z4502 Encounter for adjustment and management of automatic implantable cardiac defibrillator: Secondary | ICD-10-CM | POA: Insufficient documentation

## 2017-12-11 DIAGNOSIS — Z7901 Long term (current) use of anticoagulants: Secondary | ICD-10-CM | POA: Diagnosis not present

## 2017-12-11 DIAGNOSIS — Z79899 Other long term (current) drug therapy: Secondary | ICD-10-CM | POA: Insufficient documentation

## 2017-12-11 DIAGNOSIS — M109 Gout, unspecified: Secondary | ICD-10-CM | POA: Diagnosis not present

## 2017-12-11 DIAGNOSIS — I2510 Atherosclerotic heart disease of native coronary artery without angina pectoris: Secondary | ICD-10-CM | POA: Insufficient documentation

## 2017-12-11 DIAGNOSIS — Z794 Long term (current) use of insulin: Secondary | ICD-10-CM | POA: Diagnosis not present

## 2017-12-11 DIAGNOSIS — N184 Chronic kidney disease, stage 4 (severe): Secondary | ICD-10-CM | POA: Diagnosis not present

## 2017-12-11 DIAGNOSIS — I5022 Chronic systolic (congestive) heart failure: Secondary | ICD-10-CM

## 2017-12-11 DIAGNOSIS — I130 Hypertensive heart and chronic kidney disease with heart failure and stage 1 through stage 4 chronic kidney disease, or unspecified chronic kidney disease: Secondary | ICD-10-CM | POA: Diagnosis not present

## 2017-12-11 DIAGNOSIS — I480 Paroxysmal atrial fibrillation: Secondary | ICD-10-CM

## 2017-12-11 LAB — BASIC METABOLIC PANEL
Anion gap: 10 (ref 5–15)
BUN: 18 mg/dL (ref 6–20)
CALCIUM: 8.8 mg/dL — AB (ref 8.9–10.3)
CHLORIDE: 98 mmol/L — AB (ref 101–111)
CO2: 26 mmol/L (ref 22–32)
CREATININE: 1.37 mg/dL — AB (ref 0.61–1.24)
GFR, EST AFRICAN AMERICAN: 56 mL/min — AB (ref >60)
GFR, EST NON AFRICAN AMERICAN: 48 mL/min — AB (ref >60)
Glucose, Bld: 142 mg/dL — ABNORMAL HIGH (ref 65–99)
Potassium: 3.9 mmol/L (ref 3.5–5.1)
Sodium: 134 mmol/L — ABNORMAL LOW (ref 135–145)

## 2017-12-11 NOTE — Patient Instructions (Signed)
Labs today (will call for abnormal results, otherwise no news is good news)  Carotid dopplers have been ordered for you, [facility] office will call you to schedule.  Follow up in 4 months, we will call you to schedule appointment.

## 2017-12-14 ENCOUNTER — Ambulatory Visit (HOSPITAL_COMMUNITY)
Admission: RE | Admit: 2017-12-14 | Discharge: 2017-12-14 | Disposition: A | Discharge status: Home / Self Care | Payer: Medicare Other | Source: Ambulatory Visit | Attending: Internal Medicine | Admitting: Internal Medicine

## 2017-12-14 DIAGNOSIS — I2510 Atherosclerotic heart disease of native coronary artery without angina pectoris: Secondary | ICD-10-CM | POA: Diagnosis not present

## 2017-12-14 DIAGNOSIS — E785 Hyperlipidemia, unspecified: Secondary | ICD-10-CM | POA: Diagnosis not present

## 2017-12-14 DIAGNOSIS — Z951 Presence of aortocoronary bypass graft: Secondary | ICD-10-CM | POA: Insufficient documentation

## 2017-12-14 DIAGNOSIS — R55 Syncope and collapse: Secondary | ICD-10-CM

## 2017-12-14 DIAGNOSIS — I10 Essential (primary) hypertension: Secondary | ICD-10-CM | POA: Diagnosis not present

## 2017-12-14 DIAGNOSIS — E119 Type 2 diabetes mellitus without complications: Secondary | ICD-10-CM | POA: Insufficient documentation

## 2017-12-14 DIAGNOSIS — I6522 Occlusion and stenosis of left carotid artery: Secondary | ICD-10-CM | POA: Insufficient documentation

## 2017-12-14 DIAGNOSIS — Z794 Long term (current) use of insulin: Secondary | ICD-10-CM | POA: Diagnosis not present

## 2017-12-18 ENCOUNTER — Telehealth (HOSPITAL_COMMUNITY): Payer: Self-pay | Admitting: Pharmacist

## 2017-12-18 NOTE — Telephone Encounter (Signed)
Renewed PANF so that Mr. Denzer will have $1000 to use toward his Delene Loll copay costs through 01/01/19.  Member ID: [id number] Group ID: [id number] RxBin ID: [id number] PCN: [id number] Eligibility Start Date: 01/02/2018 Eligibility End Date: 01/01/2019 Assistance Amount: $1,000.00   Doroteo Bradford K. Velva Harman, PharmD, BCPS, CPP Clinical Pharmacist Phone: [phone] 12/18/2017 11:15 AM

## 2017-12-24 ENCOUNTER — Ambulatory Visit (INDEPENDENT_AMBULATORY_CARE_PROVIDER_SITE_OTHER): Payer: Medicare Other | Admitting: *Deleted

## 2017-12-24 DIAGNOSIS — Z95810 Presence of automatic (implantable) cardiac defibrillator: Secondary | ICD-10-CM

## 2017-12-24 DIAGNOSIS — I472 Ventricular tachycardia, unspecified: Secondary | ICD-10-CM

## 2017-12-24 DIAGNOSIS — I5022 Chronic systolic (congestive) heart failure: Secondary | ICD-10-CM | POA: Diagnosis not present

## 2017-12-24 NOTE — Progress Notes (Signed)
Remote ICD transmission.   

## 2017-12-25 ENCOUNTER — Encounter: Payer: Self-pay | Admitting: Cardiology

## 2017-12-25 NOTE — Progress Notes (Signed)
EPIC Encounter for ICM Monitoring  Patient Name: Mason Arnold is a 77 y.o. male Date: 12/25/2017 Primary Care Physican: Binnie Rail, MD Primary Cardiologist:[facility]/ [facility] Electrophysiologist: Caryl Comes [facility]phrologist: [facility] Weight:172 lbs Bi-V Pacing: >99%             Heart Failure questions reviewed, pt asymptomatic.   Thoracic impedance normal.  Prescribed dosage: Furosemide80 mg in the morning and 40 mg in the evening  Labs: 10/26/2017 Creatinine 1.78, BUN 15, Potassium 4.7, Sodium 136, EGFR 35-41 10/19/2017 Creatinine2.18, BUN34, Potassium4.1, Sodium135, [id number] 09/28/2017 Creatinine1.74, BUN28, Potassium3.8, Sodium135, [id number]  09/22/2017 Creatinine1.98, BUN36, Potassium3.7, [id number], [id number]  08/07/2017 Creatinine 1.71, BUN 28, Potassium 3.8, Sodium 137, EGFR 37-43  06/05/2017 Creatinine 1.59, BUN 33, Potassium 3.5, Sodium 134, EGFR 41-48  03/18/2017 Creatinine 1.52, BUN 23, Potassium 4.2, Sodium 135, EGFR 42-49  02/27/2017 Creatinine 1.63, BUN 17, Potassium 3.4, Sodium 136, EGFR 39-45  12/23/2016 Creatinine 1.27, BUN 15, Potassium 4.3, Sodium 137, EGFR 53->60  08/13/2016 Creatinine 1.36, BUN 21, Potassium 4.1, Sodium 135, EGFR 49-57   04/02/2016 Creatinine 1.51, BUN 17, Potassium 4.6, Sodium 136, EGFR 43-50  Recommendations: No changes.  Advised to limit salt intake to 2000 mg/day and fluid intake to < 2 liters/day.  Encouraged to call for fluid symptoms.  Follow-up plan: ICM clinic phone appointment on 01/26/2018.    Copy of ICM check sent to Dr. Caryl Comes.   3 month ICM trend: 12/25/2017    1 Year ICM trend:       Rosalene Billings, RN 12/25/2017 3:03 PM

## 2017-12-30 ENCOUNTER — Other Ambulatory Visit: Payer: Self-pay | Admitting: Internal Medicine

## 2017-12-30 DIAGNOSIS — I480 Paroxysmal atrial fibrillation: Secondary | ICD-10-CM

## 2018-01-11 LAB — CUP PACEART REMOTE DEVICE CHECK
Battery Remaining Longevity: 66 mo
Battery Remaining Percentage: 88 %
Battery Voltage: 3.05 V
Brady Statistic AP VP Percent: 96 %
Brady Statistic AS VP Percent: 3.4 %
Brady Statistic RA Percent Paced: 95 %
Date Time Interrogation Session: 20190115131314
HIGH POWER IMPEDANCE MEASURED VALUE: 41 Ohm
HIGH POWER IMPEDANCE MEASURED VALUE: 41 Ohm
Implantable Lead Implant Date: 20020513
Implantable Lead Implant Date: 20020513
Implantable Lead Implant Date: 20140109
Implantable Lead Location: 753859
Implantable Lead Location: 753860
Implantable Pulse Generator Implant Date: 20180709
Lead Channel Impedance Value: 460 Ohm
Lead Channel Impedance Value: 460 Ohm
Lead Channel Pacing Threshold Amplitude: 1 V
Lead Channel Pacing Threshold Amplitude: 2 V
Lead Channel Pacing Threshold Pulse Width: 0.4 ms
Lead Channel Pacing Threshold Pulse Width: 0.6 ms
Lead Channel Pacing Threshold Pulse Width: 0.8 ms
Lead Channel Sensing Intrinsic Amplitude: 12 mV
Lead Channel Setting Pacing Amplitude: 2.5 V
Lead Channel Setting Pacing Amplitude: 2.5 V
Lead Channel Setting Pacing Pulse Width: 0.8 ms
Lead Channel Setting Sensing Sensitivity: 0.5 mV
MDC IDC LEAD LOCATION: 753858
MDC IDC LEAD SERIAL: 117908
MDC IDC MSMT LEADCHNL RA PACING THRESHOLD AMPLITUDE: 1.25 V
MDC IDC MSMT LEADCHNL RA SENSING INTR AMPL: 3.4 mV
MDC IDC MSMT LEADCHNL RV IMPEDANCE VALUE: 600 Ohm
MDC IDC SET LEADCHNL LV PACING PULSEWIDTH: 0.6 ms
MDC IDC SET LEADCHNL RV PACING AMPLITUDE: 2.5 V
MDC IDC STAT BRADY AP VS PERCENT: 1 %
MDC IDC STAT BRADY AS VS PERCENT: 1 %
Pulse Gen Serial Number: 7398129

## 2018-01-26 ENCOUNTER — Telehealth: Payer: Self-pay | Admitting: Cardiology

## 2018-01-26 ENCOUNTER — Ambulatory Visit (INDEPENDENT_AMBULATORY_CARE_PROVIDER_SITE_OTHER): Payer: Medicare Other

## 2018-01-26 DIAGNOSIS — I5022 Chronic systolic (congestive) heart failure: Secondary | ICD-10-CM | POA: Diagnosis not present

## 2018-01-26 DIAGNOSIS — Z95810 Presence of automatic (implantable) cardiac defibrillator: Secondary | ICD-10-CM

## 2018-01-26 NOTE — Telephone Encounter (Signed)
Attempted to confirm remote transmission with pt. No answer and was unable to leave a message.   

## 2018-01-27 ENCOUNTER — Other Ambulatory Visit: Payer: Self-pay | Admitting: Internal Medicine

## 2018-01-27 DIAGNOSIS — I480 Paroxysmal atrial fibrillation: Secondary | ICD-10-CM

## 2018-01-28 NOTE — Progress Notes (Signed)
EPIC Encounter for ICM Monitoring  Patient Name: Mason Arnold is a 77 y.o. male Date: 01/28/2018 Primary Care Physican: Binnie Rail, MD Electrophysiologist: Caryl Comes Nephrologist: [facility] Weight:170lbs Bi-V Pacing: >99%       Heart Failure questions reviewed, pt has had diarrhea for the last few days which caused some dehydration.  He stated he has replaced the fluid with Gatorade and feeling fine.    Thoracic impedance abnormal suggesting dryness since 01/24/2018.  Prescribed dosage: Furosemide80 mg in the morning and 40 mg in the evening.  Patient asked for Prozac prescription and advised he would need to contact PCP for that prescription.  Labs: 10/26/2017 Creatinine 1.78, BUN 15, Potassium 4.7, Sodium 136, EGFR 35-41 10/19/2017 Creatinine2.18, BUN34, Potassium4.1, Sodium135, [id number] 09/28/2017 Creatinine1.74, BUN28, Potassium3.8, Sodium135, [id number]  09/22/2017 Creatinine1.98, BUN36, Potassium3.7, [id number], [id number]  08/07/2017 Creatinine 1.71, BUN 28, Potassium 3.8, Sodium 137, EGFR 37-43  06/05/2017 Creatinine 1.59, BUN 33, Potassium 3.5, Sodium 134, EGFR 41-48  03/18/2017 Creatinine 1.52, BUN 23, Potassium 4.2, Sodium 135, EGFR 42-49  02/27/2017 Creatinine 1.63, BUN 17, Potassium 3.4, Sodium 136, EGFR 39-45  12/23/2016 Creatinine 1.27, BUN 15, Potassium 4.3, Sodium 137, EGFR 53->60  08/13/2016 Creatinine 1.36, BUN 21, Potassium 4.1, Sodium 135, EGFR 49-57   04/02/2016 Creatinine 1.51, BUN 17, Potassium 4.6, Sodium 136, EGFR 43-50  Recommendations: No changes.   Encouraged to call for fluid symptoms.  Follow-up plan: ICM clinic phone appointment on 03/01/2018.    Copy of ICM check sent to Dr. Caryl Comes.   3 month ICM trend: 01/27/2018    1 Year ICM trend:       Rosalene Billings, RN 01/28/2018 3:49 PM

## 2018-02-01 ENCOUNTER — Encounter: Payer: Self-pay | Admitting: Internal Medicine

## 2018-02-01 ENCOUNTER — Ambulatory Visit (INDEPENDENT_AMBULATORY_CARE_PROVIDER_SITE_OTHER): Payer: Medicare Other | Admitting: Internal Medicine

## 2018-02-01 VITALS — BP 94/64 | HR 78 | Temp 98.3°F | Resp 16 | Wt 169.0 lb

## 2018-02-01 DIAGNOSIS — F329 Major depressive disorder, single episode, unspecified: Secondary | ICD-10-CM

## 2018-02-01 DIAGNOSIS — I480 Paroxysmal atrial fibrillation: Secondary | ICD-10-CM

## 2018-02-01 DIAGNOSIS — E119 Type 2 diabetes mellitus without complications: Secondary | ICD-10-CM | POA: Diagnosis not present

## 2018-02-01 DIAGNOSIS — M1A9XX0 Chronic gout, unspecified, without tophus (tophi): Secondary | ICD-10-CM

## 2018-02-01 DIAGNOSIS — Z794 Long term (current) use of insulin: Secondary | ICD-10-CM | POA: Diagnosis not present

## 2018-02-01 DIAGNOSIS — F32A Depression, unspecified: Secondary | ICD-10-CM

## 2018-02-01 MED ORDER — FLUOXETINE HCL 20 MG PO CAPS: 20.0000 mg | ORAL_CAPSULE | Freq: Every day | ORAL | 1 refills | Status: DC

## 2018-02-01 NOTE — Patient Instructions (Signed)
  Medications reviewed and updated.   No changes recommended at this time.  Your prescription(s) have been submitted to your pharmacy. Please take as directed and contact our office if you believe you are having problem(s) with the medication(s).

## 2018-02-01 NOTE — Progress Notes (Signed)
Subjective:    Patient ID: Mason Arnold, male    DOB: [birth year], 77 y.o.   MRN: [id number]  HPI The patient is here for follow up.  He follows at the [facility] and follows closely with cardiology.  Depression: He is taking his medication daily as prescribed. He denies any side effects from the medication. He feels his depression is well controlled and he is happy with his current dose of medication.  He needs a refill of his medication today.  GOUT:  He is taking allopurinol and takes colchicine as needed.  These medications are prescribed by his doctor at the [facility].  He has been experiencing flares of gout about once a month.  His kidney function is variable and they have not been able to increase his allopurinol.  He feels he is eating pretty well overall.  He is currently not exercising, but plans on getting back on his stationary bike once his knee improves.  Diabetes: His doctors at the [facility] are managing his diabetes and prescribed his medication.  He takes his insulin daily.  Sugars at home run around 100 consistently.  He is compliant with a diabetic diet.  He is currently not exercising but typically exercises regularly and will get back into exercise once his knee pain improves from the gout.  Atrial fibrillation, coronary artery disease, chronic systolic heart failure: He is following with cardiology closely.  He denies any chest pain, palpitations, shortness of breath or leg edema.  Medications and allergies reviewed with patient and updated if appropriate.  Patient Active Problem List   Diagnosis Date Noted  . Left ankle pain 11/28/2016  . Left foot pain 11/28/2016  . Right ankle pain 11/28/2016  . Syncope 06/29/2013  . Ventricular tachycardia ([place]) 06/29/2013  . Depression 11/30/2012  . 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 Atrial fibrillation ([place]) 02/03/2011  . Chronic systolic heart failure ([place]) 10/01/2010  . Coronary  atherosclerosis 06/20/2010  . HYPOGLYCEMIA, HX OF 05/21/2010  . Cardiomyopathy, ischemic 06/12/2009  . Asymptomatic hyperuricemia 08/04/2008  . HYPERLIPIDEMIA 02/11/2008  . ORTHOSTATIC HYPOTENSION 02/11/2008  . Diabetes ([place]) 10/18/2007  . GOUT 03/26/2007    Current Outpatient Medications on File Prior to Visit  Medication Sig Dispense Refill  . acetaminophen (TYLENOL) 500 MG tablet Take 500 mg by mouth every 6 (six) hours as needed. For pain    . albuterol (PROVENTIL HFA;VENTOLIN HFA) 108 (90 Base) MCG/ACT inhaler Inhale 2 puffs into the lungs every 4 (four) hours as needed for wheezing. 1 Inhaler 6  . allopurinol (ZYLOPRIM) 100 MG tablet Take 1 tablet (100 mg total) by mouth daily at 8 pm. (1900) 30 tablet 2  . calcium carbonate (OS-CAL) 600 MG TABS Take 600 mg by mouth daily after breakfast.     . Carboxymethylcellulose Sodium (THERATEARS) 0.25 % SOLN Place 1 drop into both eyes 3 (three) times daily as needed (for dry eyes.).    Marland Kitchen carvedilol (COREG) 3.125 MG tablet TAKE 1 TABLET BY MOUTH TWICE DAILY WITH MEALS (Patient taking differently: TAKE 1 TABLET BY MOUTH TWICE DAILY WITH MEALS (0700 & 2100)) 60 tablet 11  . colchicine 0.6 MG tablet Take 0.6 mg by mouth daily as needed (gout flare up).     . digoxin (LANOXIN) 0.125 MG tablet TAKE 1 TABLET (0.125 MG TOTAL) BY MOUTH DAILY. (Patient taking differently: TAKE 1 TABLET (0.125 MG TOTAL) BY MOUTH DAILY IN THE EVENING. (2100)) 90 tablet 3  . ELIQUIS 5 MG TABS  tablet TAKE 1 TABLET BY MOUTH TWICE A DAY (Patient taking differently: TAKE 1 TABLET BY MOUTH TWICE A DAY (0700 & 2100)) 60 tablet 3  . famotidine (PEPCID) 10 MG tablet Take 10 mg by mouth daily before supper. 2 HRS BEFORE SUPPER.    . furosemide (LASIX) 80 MG tablet Take 1 tablet (80 mg total) by mouth 2 (two) times daily. (Patient taking differently: Take 80 mg by mouth 2 (two) times daily. (0700 & 1900) Patient takes 80 mg in the AM and 40 mg in the PM) 60 tablet 6  . ibuprofen  (ADVIL,MOTRIN) 200 MG tablet Take 200 mg by mouth every 8 (eight) hours as needed (for pain.).    Marland Kitchen insulin glargine (LANTUS) 100 UNIT/ML injection Inject 80 Units into the skin daily at 10 pm. (2100)    . loratadine (CLARITIN) 10 MG tablet Take 10 mg by mouth daily as needed. For allergies    . Multiple Vitamin (MULTIVITAMIN) tablet Take 1 tablet by mouth daily after breakfast.     . omega-3 acid ethyl esters (LOVAZA) 1 G capsule Take 2 g by mouth 2 (two) times daily. (0700 & 1900)    . potassium chloride SA (K-DUR,KLOR-CON) 20 MEQ tablet Take 2 tablets (40 mEq total) by mouth daily. (Patient taking differently: Take 20 mEq by mouth 2 (two) times daily. (0700 & 1900)) 180 tablet 3  . sacubitril-valsartan (ENTRESTO) 49-51 MG Take 1 tablet by mouth 2 (two) times daily. (Patient taking differently: Take 1 tablet by mouth 2 (two) times daily. (0700 & 1900)) 60 tablet 6  . spironolactone (ALDACTONE) 25 MG tablet Take 25 mg by mouth daily. (1900)     No current facility-administered medications on file prior to visit.     Past Medical History:  Diagnosis Date  . Anginal pain ([place])   . Congestive heart failure ([place])   . Diabetes mellitus type II    IDDM , [facility]  . Gout   . Hyperlipidemia   . Hypertension   . Ischemic heart disease   . Left bundle branch block   . Myocardial infarction ([place]) 2000; 2002; 2004; ~ 2006  . Ventricular tachycardia [facility])     Past Surgical History:  Procedure Laterality Date  . A-V CARDIAC PACEMAKER INSERTION  2002; 2010  . BIV ICD GENERATOR CHANGEOUT N/A 06/15/2017   Procedure: BiV ICD Generator Changeout;  Surgeon: Deboraha Sprang, MD;  Location: [facility] CV LAB;  Service: Cardiovascular;  Laterality: N/A;  . BIV ICD GENERTAOR CHANGE OUT N/A 12/16/2012   Procedure: BIV ICD GENERTAOR CHANGE OUT;  Surgeon: Deboraha Sprang, MD;  Location: [facility] CATH LAB;  Service: Cardiovascular;  Laterality: N/A;  . CARDIAC CATHETERIZATION     "3 or 4; never had interventions"  (12/16/2012)  . CARDIAC CATHETERIZATION N/A 04/11/2016   Procedure: Right Heart Cath;  Surgeon: Jolaine Artist, MD;  Location: [facility] CV LAB;  Service: Cardiovascular;  Laterality: N/A;  . CORONARY ARTERY BYPASS GRAFT  2000   CABG X4  . INSERT / REPLACE / REMOVE PACEMAKER  12/16/2012   LV lead placement; ICD assessment   . LEFT AND RIGHT HEART CATHETERIZATION WITH CORONARY/GRAFT ANGIOGRAM N/A 04/04/2014   Procedure: LEFT AND RIGHT HEART CATHETERIZATION WITH Beatrix Fetters;  Surgeon: Jolaine Artist, MD;  Location: [facility] CATH LAB;  Service: Cardiovascular;  Laterality: N/A;  . [place]  . URACHAL CYST EXCISION  ~ 1995  . VASECTOMY  ?74    Social History  Socioeconomic History  . Marital status: Married    Spouse name: None  . Number of children: None  . Years of education: None  . Highest education level: None  Social Needs  . Financial resource strain: None  . Food insecurity - worry: None  . Food insecurity - inability: None  . Transportation needs - medical: None  . Transportation needs - non-medical: None  Occupational History  . None  Tobacco Use  . Smoking status: Never Smoker  . Smokeless tobacco: Former Systems developer    Types: Snuff, Chew  . Tobacco comment: 12/16/2012 "stopped chew/snuff in ~ 1992 after 15 yr"  Substance and Sexual Activity  . Alcohol use: Yes    Alcohol/week: 4.2 oz    Types: 7 Shots of liquor per week    Comment: one shot a day  . Drug use: No    Comment: none  . Sexual activity: Yes  Other Topics Concern  . None  Social History Narrative   Lives with wife in [place].     Family History  Problem Relation Age of Onset  . Heart attack Maternal Grandfather 78  . Heart attack Maternal Grandmother 82  . Heart attack Paternal Grandfather 66  . Heart attack Father 58  . Heart failure Father 20  . Aneurysm Brother   . Diabetes Neg Hx   . Stroke Neg Hx   . Cancer Neg Hx     Review of Systems    Constitutional: Negative for chills and fever.  Respiratory: Negative for cough, shortness of breath and wheezing.   Cardiovascular: Negative for chest pain, palpitations and leg swelling.  Musculoskeletal: Positive for arthralgias.  Neurological: Negative for light-headedness and headaches.       Objective:   Vitals:   02/01/18 1514  BP: 94/64  Pulse: 78  Resp: 16  Temp: 98.3 F (36.8 C)  SpO2: 97%   Wt Readings from Last 3 Encounters:  02/01/18 169 lb (76.7 kg)  12/11/17 176 lb 12.8 oz (80.2 kg)  09/28/17 176 lb 12.8 oz (80.2 kg)   Body mass index is 28.12 kg/m.   Physical Exam    Constitutional: Appears well-developed and well-nourished. No distress.  HENT:  Head: Normocephalic and atraumatic.  Neck: Neck supple. No tracheal deviation present. No thyromegaly present.  No cervical lymphadenopathy Cardiovascular: Normal rate, regular rhythm and normal heart sounds.   No murmur heard. No carotid bruit .  No edema Pulmonary/Chest: Effort normal and breath sounds normal. No respiratory distress. No has no wheezes. No rales.  Skin: Skin is warm and dry. Not diaphoretic.  Psychiatric: Normal mood and affect. Behavior is normal.      Assessment & Plan:    See Problem List for Assessment and Plan of chronic medical problems.

## 2018-02-02 NOTE — Assessment & Plan Note (Signed)
Taking insulin daily as prescribed Sugars at home run around 100 Managed by the [place]

## 2018-02-02 NOTE — Assessment & Plan Note (Signed)
Well-controlled We will continue current dose of fluoxetine-prescription sent to pharmacy

## 2018-02-02 NOTE — Assessment & Plan Note (Signed)
Managed by the [facility] Taking allopurinol daily-dose cannot be increased due to his kidney function Taking colchicine as needed Having gout approximately once a month

## 2018-02-11 ENCOUNTER — Encounter: Payer: Self-pay | Admitting: Internal Medicine

## 2018-03-01 ENCOUNTER — Ambulatory Visit (INDEPENDENT_AMBULATORY_CARE_PROVIDER_SITE_OTHER): Payer: Medicare Other

## 2018-03-01 DIAGNOSIS — Z95810 Presence of automatic (implantable) cardiac defibrillator: Secondary | ICD-10-CM | POA: Diagnosis not present

## 2018-03-01 DIAGNOSIS — I5022 Chronic systolic (congestive) heart failure: Secondary | ICD-10-CM | POA: Diagnosis not present

## 2018-03-01 NOTE — Progress Notes (Signed)
EPIC Encounter for ICM Monitoring  Patient Name: Mason Arnold is a 77 y.o. male Date: 03/01/2018 Primary Care Physican: Binnie Rail, MD Primary Cardiologist:[facility] [place] Electrophysiologist: Caryl Comes Nephrologist: [facility] Weight:170lbs Bi-V Pacing: >99%       Heart Failure questions reviewed, pt asymptomatic now but had shortness of breath and weight gain during decreased impedance.    Thoracic impedance normal but was abnormal suggesting fluid accumulation from 02/02/2018 through 02/14/2018.  Prescribed dosage: Furosemide80 mg in the morning and 40 mg in the evening.  Patient asked for Prozac prescription and advised he would need to contact PCP for that prescription.  Labs: 12/11/2017 Creatinine 1.37,  BUN 18, Potassium 3.9, Sodium 134, EGFR 48-56 10/26/2017 Creatinine 1.78,  BUN 15, Potassium 4.7, Sodium 136, EGFR 35-41 10/19/2017 Creatinine2.18,  BUN34, Potassium4.1, Sodium135, [id number] 09/28/2017 Creatinine1.74,  BUN28, Potassium3.8, Sodium135, [id number]  09/22/2017 Creatinine1.98,  BUN36, Potassium3.7, [id number], [id number] 08/07/2017 Creatinine 1.71, BUN 28, Potassium 3.8, Sodium 137, EGFR 37-43 06/05/2017 Creatinine 1.59, BUN 33, Potassium 3.5, Sodium 134, EGFR 41-48 03/18/2017 Creatinine 1.52, BUN 23, Potassium 4.2, Sodium 135, EGFR 42-49 02/27/2017 Creatinine 1.63, BUN 17, Potassium 3.4, Sodium 136, EGFR 39-45 12/23/2016 Creatinine 1.27, BUN 15, Potassium 4.3, Sodium 137, EGFR 53->60 08/13/2016 Creatinine 1.36, BUN 21, Potassium 4.1, Sodium 135, EGFR 49-57  04/02/2016 Creatinine 1.51, BUN 17, Potassium 4.6, Sodium 136, EGFR 43-50  Recommendations: No changes.   Encouraged to call for fluid symptoms.  Follow-up plan: ICM clinic phone appointment on 04/01/2018.    Copy of ICM check sent to Dr. Caryl Comes.   3 month ICM trend: 03/01/2018    1 Year ICM trend:       Rosalene Billings, RN 03/01/2018 10:58 AM

## 2018-03-02 ENCOUNTER — Other Ambulatory Visit (HOSPITAL_COMMUNITY): Payer: Self-pay | Admitting: Internal Medicine

## 2018-03-09 ENCOUNTER — Other Ambulatory Visit (HOSPITAL_COMMUNITY): Payer: Self-pay | Admitting: Cardiology

## 2018-03-30 ENCOUNTER — Encounter: Payer: Self-pay | Admitting: Nurse Practitioner

## 2018-04-01 ENCOUNTER — Ambulatory Visit (INDEPENDENT_AMBULATORY_CARE_PROVIDER_SITE_OTHER): Payer: Medicare Other | Admitting: *Deleted

## 2018-04-01 DIAGNOSIS — I472 Ventricular tachycardia, unspecified: Secondary | ICD-10-CM

## 2018-04-01 DIAGNOSIS — Z95810 Presence of automatic (implantable) cardiac defibrillator: Secondary | ICD-10-CM | POA: Diagnosis not present

## 2018-04-01 DIAGNOSIS — I5022 Chronic systolic (congestive) heart failure: Secondary | ICD-10-CM | POA: Diagnosis not present

## 2018-04-02 ENCOUNTER — Encounter: Payer: Self-pay | Admitting: Cardiology

## 2018-04-02 NOTE — Progress Notes (Signed)
Remote ICD transmission.   

## 2018-04-02 NOTE — Progress Notes (Signed)
EPIC Encounter for ICM Monitoring  Patient Name: Mason Arnold is a 77 y.o. male Date: 04/02/2018 Primary Care Physican: Binnie Rail, MD Primary Cardiologist:[facility] [place] Electrophysiologist: Caryl Comes Nephrologist: [facility] Weight:170lbs Bi-V Pacing: >99%       Heart Failure questions reviewed, pt asymptomatic.  He had shortness of breath during decreased impedance.    Thoracic impedance normal.  Prescribed dosage: Furosemide80 mg in the morning and 40 mg in the evening.   Labs: 12/11/2017 Creatinine 1.37,  BUN 18, Potassium 3.9, Sodium 134, EGFR 48-56 10/26/2017 Creatinine 1.78,  BUN 15, Potassium 4.7, Sodium 136, EGFR 35-41 10/19/2017 Creatinine2.18,  BUN34, Potassium4.1, Sodium135, [id number] 09/28/2017 Creatinine1.74,  BUN28, Potassium3.8, Sodium135, [id number]  09/22/2017 Creatinine1.98,  BUN36, Potassium3.7, [id number], [id number] 08/07/2017 Creatinine 1.71, BUN 28, Potassium 3.8, Sodium 137, EGFR 37-43 06/05/2017 Creatinine 1.59, BUN 33, Potassium 3.5, Sodium 134, EGFR 41-48 03/18/2017 Creatinine 1.52, BUN 23, Potassium 4.2, Sodium 135, EGFR 42-49 02/27/2017 Creatinine 1.63, BUN 17, Potassium 3.4, Sodium 136, EGFR 39-45 12/23/2016 Creatinine 1.27, BUN 15, Potassium 4.3, Sodium 137, EGFR 53->60  Recommendations: No changes.  Encouraged to call for fluid symptoms.  Follow-up plan: ICM clinic phone appointment on 05/04/2018.    Copy of ICM check sent to Dr. Caryl Comes.   3 month ICM trend: 04/02/2018    1 Year ICM trend:       Rosalene Billings, RN 04/02/2018 4:40 PM

## 2018-04-23 LAB — CUP PACEART REMOTE DEVICE CHECK
Battery Remaining Longevity: 65 mo
Battery Voltage: 2.99 V
Brady Statistic AP VP Percent: 95 %
Brady Statistic AP VS Percent: 1 %
Brady Statistic AS VP Percent: 4.7 %
Brady Statistic AS VS Percent: 1 %
Brady Statistic RA Percent Paced: 94 %
Date Time Interrogation Session: 20190426052846
HIGH POWER IMPEDANCE MEASURED VALUE: 46 Ohm
HighPow Impedance: 46 Ohm
Implantable Lead Implant Date: 20020513
Implantable Lead Implant Date: 20020513
Implantable Lead Location: 753858
Implantable Lead Location: 753859
Implantable Lead Location: 753860
Implantable Lead Serial Number: 117908
Lead Channel Impedance Value: 530 Ohm
Lead Channel Impedance Value: 650 Ohm
Lead Channel Pacing Threshold Amplitude: 1 V
Lead Channel Pacing Threshold Amplitude: 1.25 V
Lead Channel Pacing Threshold Amplitude: 2 V
Lead Channel Pacing Threshold Pulse Width: 0.4 ms
Lead Channel Sensing Intrinsic Amplitude: 12 mV
Lead Channel Setting Pacing Amplitude: 2.5 V
Lead Channel Setting Pacing Amplitude: 2.5 V
Lead Channel Setting Pacing Pulse Width: 0.6 ms
Lead Channel Setting Sensing Sensitivity: 0.5 mV
MDC IDC LEAD IMPLANT DT: 20140109
MDC IDC MSMT BATTERY REMAINING PERCENTAGE: 84 %
MDC IDC MSMT LEADCHNL LV PACING THRESHOLD PULSEWIDTH: 0.6 ms
MDC IDC MSMT LEADCHNL RA IMPEDANCE VALUE: 550 Ohm
MDC IDC MSMT LEADCHNL RA SENSING INTR AMPL: 4 mV
MDC IDC MSMT LEADCHNL RV PACING THRESHOLD PULSEWIDTH: 0.8 ms
MDC IDC PG IMPLANT DT: 20180709
MDC IDC SET LEADCHNL RV PACING AMPLITUDE: 2.5 V
MDC IDC SET LEADCHNL RV PACING PULSEWIDTH: 0.8 ms
Pulse Gen Serial Number: 7398129

## 2018-05-04 ENCOUNTER — Telehealth: Payer: Self-pay | Admitting: Cardiology

## 2018-05-04 NOTE — Telephone Encounter (Signed)
Spoke with pt and reminded pt of remote transmission that is due today. Pt verbalized understanding.   

## 2018-05-07 ENCOUNTER — Encounter: Payer: Self-pay | Admitting: Internal Medicine

## 2018-05-07 ENCOUNTER — Ambulatory Visit (HOSPITAL_COMMUNITY)
Admission: RE | Admit: 2018-05-07 | Discharge: 2018-05-07 | Disposition: A | Discharge status: Home / Self Care | Payer: Medicare Other | Source: Ambulatory Visit | Attending: Internal Medicine | Admitting: Internal Medicine

## 2018-05-07 ENCOUNTER — Other Ambulatory Visit: Payer: Self-pay

## 2018-05-07 VITALS — BP 118/80 | HR 64 | Wt 171.1 lb

## 2018-05-07 DIAGNOSIS — M109 Gout, unspecified: Secondary | ICD-10-CM | POA: Diagnosis not present

## 2018-05-07 DIAGNOSIS — Z794 Long term (current) use of insulin: Secondary | ICD-10-CM | POA: Diagnosis not present

## 2018-05-07 DIAGNOSIS — I2510 Atherosclerotic heart disease of native coronary artery without angina pectoris: Secondary | ICD-10-CM | POA: Diagnosis not present

## 2018-05-07 DIAGNOSIS — I255 Ischemic cardiomyopathy: Secondary | ICD-10-CM | POA: Insufficient documentation

## 2018-05-07 DIAGNOSIS — E785 Hyperlipidemia, unspecified: Secondary | ICD-10-CM | POA: Insufficient documentation

## 2018-05-07 DIAGNOSIS — Z95810 Presence of automatic (implantable) cardiac defibrillator: Secondary | ICD-10-CM

## 2018-05-07 DIAGNOSIS — Z951 Presence of aortocoronary bypass graft: Secondary | ICD-10-CM | POA: Diagnosis not present

## 2018-05-07 DIAGNOSIS — I130 Hypertensive heart and chronic kidney disease with heart failure and stage 1 through stage 4 chronic kidney disease, or unspecified chronic kidney disease: Secondary | ICD-10-CM | POA: Diagnosis not present

## 2018-05-07 DIAGNOSIS — E1122 Type 2 diabetes mellitus with diabetic chronic kidney disease: Secondary | ICD-10-CM | POA: Insufficient documentation

## 2018-05-07 DIAGNOSIS — I5022 Chronic systolic (congestive) heart failure: Secondary | ICD-10-CM

## 2018-05-07 DIAGNOSIS — Z79899 Other long term (current) drug therapy: Secondary | ICD-10-CM | POA: Insufficient documentation

## 2018-05-07 DIAGNOSIS — I480 Paroxysmal atrial fibrillation: Secondary | ICD-10-CM

## 2018-05-07 DIAGNOSIS — N184 Chronic kidney disease, stage 4 (severe): Secondary | ICD-10-CM | POA: Diagnosis not present

## 2018-05-07 LAB — BASIC METABOLIC PANEL
ANION GAP: 11 (ref 5–15)
BUN: 15 mg/dL (ref 6–20)
CHLORIDE: 98 mmol/L — AB (ref 101–111)
CO2: 29 mmol/L (ref 22–32)
Calcium: 9.5 mg/dL (ref 8.9–10.3)
Creatinine, Ser: 1.47 mg/dL — ABNORMAL HIGH (ref 0.61–1.24)
GFR calc non Af Amer: 44 mL/min — ABNORMAL LOW (ref >60)
GFR, EST AFRICAN AMERICAN: 51 mL/min — AB (ref >60)
GLUCOSE: 159 mg/dL — AB (ref 65–99)
Potassium: 4.3 mmol/L (ref 3.5–5.1)
Sodium: 138 mmol/L (ref 135–145)

## 2018-05-07 LAB — DIGOXIN LEVEL: Digoxin Level: 0.3 ng/mL — ABNORMAL LOW (ref 0.8–2.0)

## 2018-05-07 MED ORDER — EMPAGLIFLOZIN 10 MG PO TABS: 10.0000 mg | ORAL_TABLET | Freq: Every day | ORAL | 3 refills | Status: DC

## 2018-05-07 NOTE — Progress Notes (Signed)
ICM remote transmission rescheduled since patient has fluid levels checked at Dr Gillermina Hu office today.  Rescheduled for 05/24/2018.

## 2018-05-07 NOTE — Progress Notes (Signed)
Patient ID: Mason Arnold, male   DOB: 04-15-1941, 77 y.o.   MRN: [id number]   [facility]IN[facility] NOTE PCP: [facility] Primary cardiologist: Dr. Stanford Breed EP: Dr Caryl Comes  Mason Arnold is a 77 y.o. male with history of CAD s/p CABG 2000, ischemic cardiomyopathy (20%) with [employer] CRT-D, DM2, paroxysmal atrial fibrillation, and recurrent syncope of uncertain etiology.    He had RHC/LHC in 4/15 showing patent grafts but elevated filling pressures and low cardiac index (1.9 Fick/2.2 thermo).   Follow up for Heart Failure: He returns for HF follow up with his wife. Over the past few months, creatinine climbing up slowly.  1.5-> 1.7 -> 1.9 -> 2.2 but was actually back down to 1.8 in 11/18. Last visit had progressive HF symptoms and echo and CPX ordered.  Echo 10/26/17 EF stable at 25-30% Personally reviewed CPX:  10/26/17 showed mild to moderate HF limitation. Ventilatory limited at peak (see below)  He returns today for routine follow up with his wife. Overall doing well. He is SOB with stairs and walking on inclines. Gets SOB when he over exerts himself. Worse when his weight is up. About the same as before. Dizziness resolved. Drinks a lot of fluid and has a hard time regulating himself.  No orthopnea, PND, or edema. No CP or dizziness. Weights 159-166 lbs. Taking metolazone about 1x/week. Taking all meds.    ICD interrogated: No VF/VT. No AF. Active about 1 hour/day. Thoracic impedence just below threshold, but trending up. One episode of pacemaker mediated tachycardia on 5/27  CPX 11/18  FVC 2.77 (83%)    FEV1 2.14 (85%)     FEV1/FVC 77 (101%)      MVV 82 (81%) Resting HR: 64 Peak HR: 128  (90% age predicted max HR) BP rest: 100/64 BP peak: 126/60  Peak VO2: 18.5 (76% predicted peak VO2) VE/VCO2 slope: 37  OUES: 1.43 Peak RER: 1.10 Ventilatory Threshold: 14.9 (67% predicted or measured peak VO2) VE/MVV: 74% O2pulse: 12  (100% predicted  O2pulse)  Echo 11/18: EF 25-30% ECHO 04/16/2016: EF 20% IVC normal. RV mildly reduced Echo 8/15: EF 15-20% RV moderate dysfunction.   Sharp 04/2016 RA = 7 RV = 48/2/8 PA = 54/20 (33) PCW = 19 Fick cardiac output/index = 4.2/2.4 PVR = 3.3 WU Ao sat = 93% PA sat = 57%, 60%  Last cath 4/15 RA = 9 RV = 69/0/13 PA = 61/24 (38) PCW = 29 Fick cardiac output/index = 3.4/1.9 Thermo CO/CI = 4.1 /2.2 PVR = 2.7 [place] FA sat = 94% PA sat = 57%, 57% Ao Pressure: 104/58 (77) LV Pressure: 103/13/29  Left main: Mild plaque LAD: Totally occluded ostially LCX: Small ramus with high-grade ostial disease. AV groove LCX appears occluded in mid section. OM-1 occluded proximally. RCA: Unable to be cannulated. Suspect occluded ostially.  LV-gram done in the RAO projection: Ejection fraction = 15%. Basal inferior and anterior walls only sections that contract.  LIMA to LAD: Patent SVG to Ramus: Patent SVG to OM-3: Patent SVG to R PDA: Patent. The RCA is occluded prior to take-off of PDA. There are collaterals from distal RCA to OM-1 and probably a septal perforator.  CPX 04/24/14: FVC 2.70 (78%)  FEV1 2.15 (86%)  FEV1/FVC 80%  MVV 84 (80%) Resting HR: 65 Peak HR: 134 (92% age predicted max HR) Peak VO2: 19.6 (76.1% predicted peak VO2) VE/VCO2 slope: 34.8 OUES: 1.58 Peak RER: 1.23 Ventilatory Threshold: 15.0 (58.3% predicted peak VO2) Peak RR 46 Peak  Ventilation: 68.1 VE/MVV: 81% PETCO2 at peak: 30 O2pulse: 11 (76% predicted O2pulse)  Labs (4/15): K 4.7, creatinine 1.4 Labs (5/15): K 3.7 Cr 1.56 pBNP 1,353 Labs (8/15): K 4.8, creatinine 1.6, pBNP 724 Labs (11/13/2014): K 3.6 Creatinine 1.79 dig level 0.5   PMH: 1. HTN 2. Hyperlipidemia 3. CAD: s/p CABG with LIMA-LAD, SVG-ramus, SVG-OM3, SVG-PDA.  LHC (4/15) with patent grafts.  4. Ischemic cardiomyopathy: Echo (8/14) with EF 20-25% with regional WMAs, mild MR, mildly decreased RV systolic function.  EF LV-gram (4/15) was 15%. He has a  [employer], political party CRT-D device (upgraded 1/14).  RHC (4/15): mean RA 9, PA 61/24 (mean 38), mean PCWP 29, CI 1.9 Fick/2.2 thermo, PVR 2.7. CPX (04/2014): Peak VO2: 19.6 (76% predicted peak VO2), VE/VCO2: 34.8, Peak RER 1.23.  Echo (10/15) with EF 20-25% with regional wall motion abnormalities, normal RV size with mild to moderately decreased systolic function, mild AS, mild MR.  5. Syncopal episodes: Relatively frequent, etiology uncertain.  He does get lightheaded/near-syncopal with coughing spells, but he has syncope at other times also with no warning.  Since device has been in place, he has had VT detected but it has not been clearly correlated with syncopal episodes. Suspect vasomotor.  6. Atrial fibrillation: Paroxysmal.  Has not been anticoagulated due to relatively frequent syncope/falls.  7. Gout 8. H/o LBBB 9. Type II diabetes 10. CKD  SH: Married, lives in [place], nonsmoker, rare ETOH.   FH: CAD  ROS: All systems reviewed and negative except as per HPI.   Current Outpatient Medications  Medication Sig Dispense Refill  . acetaminophen (TYLENOL) 500 MG tablet Take 500 mg by mouth every 6 (six) hours as needed. For pain    . albuterol (PROVENTIL HFA;VENTOLIN HFA) 108 (90 Base) MCG/ACT inhaler Inhale 2 puffs into the lungs every 4 (four) hours as needed for wheezing. 1 Inhaler 6  . allopurinol (ZYLOPRIM) 100 MG tablet Take 1 tablet (100 mg total) by mouth daily at 8 pm. (1900) 30 tablet 2  . calcium carbonate (OS-CAL) 600 MG TABS Take 600 mg by mouth daily after breakfast.     . Carboxymethylcellulose Sodium (THERATEARS) 0.25 % SOLN Place 1 drop into both eyes 3 (three) times daily as needed (for dry eyes.).    Marland Kitchen carvedilol (COREG) 3.125 MG tablet TAKE ONE TABLET BY MOUTH TWICE DAILY WITH MEALS 60 tablet 10  . colchicine 0.6 MG tablet Take 0.6 mg by mouth daily as needed (gout flare up).     . digoxin (LANOXIN) 0.125 MG tablet TAKE 1 TABLET (0.125 MG TOTAL) BY MOUTH DAILY. (Patient taking  differently: TAKE 1 TABLET (0.125 MG TOTAL) BY MOUTH DAILY IN THE EVENING. (2100)) 90 tablet 3  . ELIQUIS 5 MG TABS tablet TAKE 1 TABLET BY MOUTH TWICE A DAY (Patient taking differently: TAKE 1 TABLET BY MOUTH TWICE A DAY (0700 & 2100)) 60 tablet 3  . famotidine (PEPCID) 10 MG tablet Take 10 mg by mouth daily before supper. 2 HRS BEFORE SUPPER.    Marland Kitchen FLUoxetine (PROZAC) 20 MG capsule Take 1 capsule (20 mg total) by mouth daily. 90 capsule 1  . furosemide (LASIX) 80 MG tablet Take 1 tablet (80 mg total) by mouth 2 (two) times daily. (Patient taking differently: Take 80 mg by mouth 2 (two) times daily. (0700 & 1900) Patient takes 80 mg in the AM and 40 mg in the PM) 60 tablet 6  . insulin glargine (LANTUS) 100 UNIT/ML injection Inject 34 Units into the skin  daily at 10 pm. (2100)    . loratadine (CLARITIN) 10 MG tablet Take 10 mg by mouth daily as needed. For allergies    . Multiple Vitamin (MULTIVITAMIN) tablet Take 1 tablet by mouth daily after breakfast.     . omega-3 acid ethyl esters (LOVAZA) 1 G capsule Take 2 g by mouth 2 (two) times daily. (0700 & 1900)    . potassium chloride SA (K-DUR,KLOR-CON) 20 MEQ tablet Take 1 tablet (20 mEq total) by mouth 2 (two) times daily. (0700 & 1900) 60 tablet 3  . sacubitril-valsartan (ENTRESTO) 49-51 MG Take 1 tablet by mouth 2 (two) times daily. (Patient taking differently: Take 1 tablet by mouth 2 (two) times daily. (0700 & 1900)) 60 tablet 6  . spironolactone (ALDACTONE) 25 MG tablet Take 25 mg by mouth daily. (1900)     No current facility-administered medications for this encounter.     BP 118/80   Pulse 64   Wt 171 lb 1.9 oz (77.6 kg)   SpO2 96%   BMI 28.48 kg/m  Wt Readings from Last 3 Encounters:  05/07/18 171 lb 1.9 oz (77.6 kg)  02/01/18 169 lb (76.7 kg)  12/11/17 176 lb 12.8 oz (80.2 kg)    General: Well appearing. No resp difficulty. HEENT: Normal anicteric  Neck: Supple. JVP 5-6. Carotids 2+ bilat; no bruits. No thyromegaly or nodule  noted. Cor: PMI nondisplaced. RRR, No M/G/R noted Lungs: CTAB, normal effort. No wheeze Abdomen: Soft, non-tender, non-distended, no HSM. No bruits or masses. +BS  Extremities: no cyanosis, clubbing, rash, edema Neuro: alert & oriented x 3, cranial nerves grossly intact. moves all 4 extremities w/o difficulty. Affect pleasant   Assessment/Plan: 1. Chronic systolic CHF: Ischemic cardiomyopathy s/p St Jude CRT-D, EF 20%(04/2016 )  -Echo 11/18 EF stable 25-30% -CPX 11/18 Peak VO2: 18.5 (76% predicted peak VO2) VE/VCO2 slope: 37  - CPX test reviewed with him and his wife personally. Only mild to moderate HF limitation. Reassuring. - ICD interrogated. Thoracic impedence below threshold but trending up.  - Volume status stable on exam. NYHA class II - Volume status much improved continue lasix 80 mg BID. Limit metolazone due to recent AKI - Continue Entresto 49/51.  - Continue spironolactone 25, digoxin and Coreg 3.125 mg BID.  - BMET today.  2. CAD:  - Doing well. No s/s ischemia -Patent grafts on LHC in 4/15. Continue statin.  With Eliquis so no longer taking aspirin. Can drop dose to 2.5 bid when he turns 79    3. Paroxysmal atrial fibrillation:  - ICD interrogated personally. No PAF - Continue eliquis 5 mg twice a day.  4. H/o Recurrent syncope  - Suspect cough syncope. Had another episode 2 weeks ago but recovered quickly. No events on ICD. Now stable.  5. ICD  --Follows with Dr. Caryl Comes.  - One episode of PMT on device interrogation. Will have him follow up with Dr Caryl Comes.  6. CKD, III-IV - Creatinine improved on last check. BMET today.  7. Dizziness on turning head to right - No carotid bruits or hypersensitvity on exam Will check carotid u/s. - This has resolved.  8. DM2 - followed by PCP - consider Jardiance No change.   BMET today  Georgiana Shore NP 05/07/2018  Patient seen and examined with the above-signed Advanced Practice Provider and/or Housestaff. I personally  reviewed laboratory data, imaging studies and relevant notes. I independently examined the patient and formulated the important aspects of the plan. I have edited the  note to reflect any of my changes or salient points. I have personally discussed the plan with the patient and/or family.  Overall stable NYHA III. Volume status up and down but managed well with with once weekly metolazone. ICD check ok in clinic. No VT. Will start Jardiance for DM2.  Glori Bickers, MD  2:15 PM

## 2018-05-07 NOTE — Patient Instructions (Signed)
Start Jardiance 10 mg (1 tab) daily  Labs drawn today (if we do not call you, then your lab work was stable)   Your physician recommends that you schedule a follow-up appointment in: 4 months with Dr. Aundra Dubin

## 2018-05-24 ENCOUNTER — Ambulatory Visit (INDEPENDENT_AMBULATORY_CARE_PROVIDER_SITE_OTHER): Payer: Medicare Other

## 2018-05-24 DIAGNOSIS — I5022 Chronic systolic (congestive) heart failure: Secondary | ICD-10-CM

## 2018-05-24 DIAGNOSIS — Z95810 Presence of automatic (implantable) cardiac defibrillator: Secondary | ICD-10-CM

## 2018-05-24 NOTE — Progress Notes (Signed)
EPIC Encounter for ICM Monitoring  Patient Name: Mason Arnold is a 77 y.o. male Date: 05/24/2018 Primary Care Physican: Binnie Rail, MD Primary Cardiologist:[facility] [place] Electrophysiologist: Caryl Comes Nephrologist: [facility] Weight:163 lbs Bi-V Pacing: >99%   Heart Failure questions reviewed, pt had shortness of breath and weight gain of 2-3 lbs but taking Metolazone relieved symptoms.  He reported stress has greatest impact on symptoms   Thoracic impedance normal but was abnormal suggesting fluid accumulation from 05/20/2018 - 05/24/2018.  Prescribed dosage: Furosemide80 mg in the morning and 40 mg in the evening.   Labs: 05/07/2018 Creatinine 1.47, BUN 15,  Potassium 4.3, Sodium 138, EGFR 44-51 12/11/2017 Creatinine 1.37, BUN 18, Potassium 3.9, Sodium 134, EGFR 48-56 10/26/2017 Creatinine 1.78, BUN 15, Potassium 4.7, Sodium 136, EGFR 35-41 10/19/2017 Creatinine2.18, BUN34, Potassium4.1, Sodium135, [id number] 09/28/2017 Creatinine1.74, BUN28, Potassium3.8, Sodium135, [id number]  09/22/2017 Creatinine1.98, BUN36, Potassium3.7, [id number], [id number] 08/07/2017 Creatinine 1.71, BUN 28, Potassium 3.8, Sodium 137, EGFR 37-43 06/05/2017 Creatinine 1.59, BUN 33, Potassium 3.5, Sodium 134, EGFR 41-48 03/18/2017 Creatinine 1.52, BUN 23, Potassium 4.2, Sodium 135, EGFR 42-49 02/27/2017 Creatinine 1.63, BUN 17, Potassium 3.4, Sodium 136, EGFR 39-45 12/23/2016 Creatinine 1.27, BUN 15, Potassium 4.3, Sodium 137, EGFR 53->60  Recommendations: No changes.  Reinforced fluid restriction and sodium restriction.  Encouraged to call for fluid symptoms.  Follow-up plan: ICM clinic phone appointment on 07/01/2018.    Copy of ICM check sent to Dr. Caryl Comes.   3 month ICM trend: 05/24/2018    1 Year ICM trend:       Rosalene Billings, RN 05/24/2018 11:50 AM

## 2018-06-05 ENCOUNTER — Encounter (HOSPITAL_COMMUNITY): Payer: Self-pay | Admitting: Internal Medicine

## 2018-07-01 ENCOUNTER — Telehealth: Payer: Self-pay | Admitting: Cardiology

## 2018-07-01 ENCOUNTER — Ambulatory Visit: Payer: Medicare Other | Admitting: *Deleted

## 2018-07-01 DIAGNOSIS — I472 Ventricular tachycardia: Secondary | ICD-10-CM | POA: Diagnosis not present

## 2018-07-01 NOTE — Telephone Encounter (Signed)
Spoke with pt and reminded pt of remote transmission that is due today. Pt verbalized understanding.   

## 2018-07-02 NOTE — Progress Notes (Signed)
Remote ICD transmission.   

## 2018-07-13 ENCOUNTER — Other Ambulatory Visit (HOSPITAL_COMMUNITY): Payer: Self-pay | Admitting: Cardiology

## 2018-07-15 NOTE — Progress Notes (Signed)
No ICM remote transmission received for 07/01/2018 and next ICM transmission scheduled for 07/29/2018.

## 2018-07-28 ENCOUNTER — Other Ambulatory Visit: Payer: Self-pay | Admitting: Internal Medicine

## 2018-07-28 DIAGNOSIS — I480 Paroxysmal atrial fibrillation: Secondary | ICD-10-CM

## 2018-07-29 ENCOUNTER — Ambulatory Visit (INDEPENDENT_AMBULATORY_CARE_PROVIDER_SITE_OTHER): Payer: Medicare Other

## 2018-07-29 DIAGNOSIS — Z95810 Presence of automatic (implantable) cardiac defibrillator: Secondary | ICD-10-CM

## 2018-07-29 DIAGNOSIS — I5022 Chronic systolic (congestive) heart failure: Secondary | ICD-10-CM

## 2018-07-29 NOTE — Progress Notes (Signed)
EPIC Encounter for ICM Monitoring  Patient Name: Mason Arnold is a 77 y.o. male Date: 07/29/2018 Primary Care Physican: Binnie Rail, MD Primary Cardiologist:[facility] clinic [place] Electrophysiologist: Caryl Comes Nephrologist: [facility] Weight:161 lbs(baseline closer to 157-158 lbs) Bi-V Pacing: 97%       Heart Failure questions reviewed, pt symptomatic with weight gain of 2-3 lbs.  He reported he takes Metolazone as needed and last dose was 2 weeks ago.  Advised Metolazone is not on med list and Dr Bensimhon's last office note 05/07/2018 says to limit Metolazone due to a recent AKI.    Thoracic impedance abnormal suggesting fluid accumulation starting 07/19/2018 but trending closer to baseline.  Prescribed dosage: Furosemide80 mg in the morning and 40 mg in the evening.     Labs: 05/07/2018 Creatinine 1.47, BUN 15,  Potassium 4.3, Sodium 138, EGFR 44-51 12/11/2017 Creatinine 1.37, BUN 18, Potassium 3.9, Sodium 134, EGFR 48-56 10/26/2017 Creatinine 1.78, BUN 15, Potassium 4.7, Sodium 136, EGFR 35-41 10/19/2017 Creatinine2.18, BUN34, Potassium4.1, Sodium135, [id number] 09/28/2017 Creatinine1.74, BUN28, Potassium3.8, Sodium135, [id number]  09/22/2017 Creatinine1.98, BUN36, Potassium3.7, [id number], [id number] 08/07/2017 Creatinine 1.71, BUN 28, Potassium 3.8, Sodium 137, EGFR 37-43 06/05/2017 Creatinine 1.59, BUN 33, Potassium 3.5, Sodium 134, EGFR 41-48 03/18/2017 Creatinine 1.52, BUN 23, Potassium 4.2, Sodium 135, EGFR 42-49 02/27/2017 Creatinine 1.63, BUN 17, Potassium 3.4, Sodium 136, EGFR 39-45 12/23/2016 Creatinine 1.27, BUN 15, Potassium 4.3, Sodium 137, EGFR 53->60  Recommendations:    Patient stated he will take Metolazone.  Advised would be better for kidneys to try and resolve weight gain without use of Metolazone.  He said he understood.    Follow-up plan: ICM clinic phone appointment on 08/10/2018 to recheck fluid levels.   Office appointment scheduled  09/01/2018 with Dr. Haroldine Laws.    Copy of ICM check sent to Dr. Caryl Comes and Dr Haroldine Laws for review and recommendation if needed.  Also patient takes Metolazone but not on med list.   3 month ICM trend: 07/29/2018    1 Year ICM trend:       Rosalene Billings, RN 07/29/2018 1:31 PM

## 2018-08-10 ENCOUNTER — Ambulatory Visit (INDEPENDENT_AMBULATORY_CARE_PROVIDER_SITE_OTHER): Payer: Self-pay

## 2018-08-10 DIAGNOSIS — Z95810 Presence of automatic (implantable) cardiac defibrillator: Secondary | ICD-10-CM

## 2018-08-10 DIAGNOSIS — I5022 Chronic systolic (congestive) heart failure: Secondary | ICD-10-CM

## 2018-08-10 NOTE — Progress Notes (Signed)
EPIC Encounter for ICM Monitoring  Patient Name: Mason Arnold is a 77 y.o. male Date: 08/10/2018 Primary Care Physican: Binnie Rail, MD Primary Cardiologist:[facility] clinic [place] Electrophysiologist: Caryl Comes Nephrologist: [facility] Weight:161lbs(baseline closer to 157-158 lbs) Bi-V Pacing: 97%       Heart Failure questions reviewed, pt symptomatic with steady increase in weight by about 2 lbs in last few days.   Thoracic impedance abnormal suggesting fluid accumulation starting 08/08/2018.  Prescribed dosage: Furosemide80 mg in the morning and 40 mg in the evening.     Labs: 05/07/2018 Creatinine 1.47, BUN 15, Potassium 4.3, Sodium 138, EGFR 44-51 12/11/2017 Creatinine 1.37, BUN 18, Potassium 3.9, Sodium 134, EGFR 48-56 10/26/2017 Creatinine 1.78, BUN 15, Potassium 4.7, Sodium 136, EGFR 35-41 10/19/2017 Creatinine2.18, BUN34, Potassium4.1, Sodium135, [id number] 09/28/2017 Creatinine1.74, BUN28, Potassium3.8, Sodium135, [id number]  09/22/2017 Creatinine1.98, BUN36, Potassium3.7, [id number], [id number] 08/07/2017 Creatinine 1.71, BUN 28, Potassium 3.8, Sodium 137, EGFR 37-43 06/05/2017 Creatinine 1.59, BUN 33, Potassium 3.5, Sodium 134, EGFR 41-48 03/18/2017 Creatinine 1.52, BUN 23, Potassium 4.2, Sodium 135, EGFR 42-49 02/27/2017 Creatinine 1.63, BUN 17, Potassium 3.4, Sodium 136, EGFR 39-45 12/23/2016 Creatinine 1.27, BUN 15, Potassium 4.3, Sodium 137, EGFR 53->60  Recommendations:  Patient reported taking extra Furosemide today.   Follow-up plan: ICM clinic phone appointment on 08/19/2018 to recheck fluid levels.   Office appointment scheduled 09/01/2018 with Dr. Haroldine Laws.   Copy of ICM check sent to Dr. Caryl Comes and Dr Haroldine Laws.   3 month ICM trend: 08/10/2018    AT/AF     1 Year ICM trend:       Rosalene Billings, RN 08/10/2018 2:31 PM

## 2018-08-11 NOTE — Progress Notes (Signed)
Not received  

## 2018-08-19 ENCOUNTER — Ambulatory Visit (INDEPENDENT_AMBULATORY_CARE_PROVIDER_SITE_OTHER): Payer: Medicare Other

## 2018-08-19 DIAGNOSIS — I5022 Chronic systolic (congestive) heart failure: Secondary | ICD-10-CM

## 2018-08-19 DIAGNOSIS — Z95810 Presence of automatic (implantable) cardiac defibrillator: Secondary | ICD-10-CM

## 2018-08-20 NOTE — Progress Notes (Signed)
EPIC Encounter for ICM Monitoring  Patient Name: Mason Arnold is a 77 y.o. male Date: 08/20/2018 Primary Care Physican: Binnie Rail, MD Primary Cardiologist:[facility] clinic [place] Electrophysiologist: Caryl Comes Nephrologist: [facility] Weight:151.2lbs(baseline closer to 157-158 lbs) Bi-V Pacing: 97%      Heart Failure questions reviewed, pt asymptomatic and resolved shortness of breath and weight gain.    Thoracic impedance returned to normal after taking extra Furosemide.  Prescribed: Furosemide80 mg in the morning and 40 mg in the evening.    Labs: 05/07/2018 Creatinine 1.47, BUN 15, Potassium 4.3, Sodium 138, EGFR 44-51 12/11/2017 Creatinine 1.37, BUN 18, Potassium 3.9, Sodium 134, EGFR 48-56 10/26/2017 Creatinine 1.78, BUN 15, Potassium 4.7, Sodium 136, EGFR 35-41 10/19/2017 Creatinine2.18, BUN34, Potassium4.1, Sodium135, [id number] 09/28/2017 Creatinine1.74, BUN28, Potassium3.8, Sodium135, [id number]  09/22/2017 Creatinine1.98, BUN36, Potassium3.7, [id number], [id number]  Recommendations: No changes.    Encouraged to call for fluid symptoms.  Follow-up plan: ICM clinic phone appointment on 09/16/2018.   Office appointment scheduled 09/01/2018 with Dr. Haroldine Laws and Dr Caryl Comes 09/30/2018.    Copy of ICM check sent to Dr. Caryl Comes.   3 month ICM trend: 08/19/2018    AT/AF   1 Year ICM trend:       Rosalene Billings, RN 08/20/2018 1:11 PM

## 2018-08-31 ENCOUNTER — Other Ambulatory Visit (HOSPITAL_COMMUNITY): Payer: Self-pay | Admitting: Internal Medicine

## 2018-09-01 ENCOUNTER — Encounter (HOSPITAL_COMMUNITY): Payer: Self-pay | Admitting: Internal Medicine

## 2018-09-01 ENCOUNTER — Other Ambulatory Visit: Payer: Self-pay

## 2018-09-01 ENCOUNTER — Ambulatory Visit (HOSPITAL_COMMUNITY)
Admission: RE | Admit: 2018-09-01 | Discharge: 2018-09-01 | Disposition: A | Discharge status: Home / Self Care | Payer: Medicare Other | Source: Ambulatory Visit | Attending: Internal Medicine | Admitting: Internal Medicine

## 2018-09-01 VITALS — BP 90/62 | HR 68 | Wt 155.2 lb

## 2018-09-01 DIAGNOSIS — I130 Hypertensive heart and chronic kidney disease with heart failure and stage 1 through stage 4 chronic kidney disease, or unspecified chronic kidney disease: Secondary | ICD-10-CM | POA: Diagnosis not present

## 2018-09-01 DIAGNOSIS — R55 Syncope and collapse: Secondary | ICD-10-CM | POA: Insufficient documentation

## 2018-09-01 DIAGNOSIS — Z951 Presence of aortocoronary bypass graft: Secondary | ICD-10-CM | POA: Insufficient documentation

## 2018-09-01 DIAGNOSIS — Z794 Long term (current) use of insulin: Secondary | ICD-10-CM | POA: Diagnosis not present

## 2018-09-01 DIAGNOSIS — Z79899 Other long term (current) drug therapy: Secondary | ICD-10-CM | POA: Diagnosis not present

## 2018-09-01 DIAGNOSIS — I2510 Atherosclerotic heart disease of native coronary artery without angina pectoris: Secondary | ICD-10-CM | POA: Diagnosis not present

## 2018-09-01 DIAGNOSIS — I5022 Chronic systolic (congestive) heart failure: Secondary | ICD-10-CM | POA: Diagnosis not present

## 2018-09-01 DIAGNOSIS — N183 Chronic kidney disease, stage 3 (moderate): Secondary | ICD-10-CM | POA: Insufficient documentation

## 2018-09-01 DIAGNOSIS — Z7901 Long term (current) use of anticoagulants: Secondary | ICD-10-CM | POA: Insufficient documentation

## 2018-09-01 DIAGNOSIS — I480 Paroxysmal atrial fibrillation: Secondary | ICD-10-CM | POA: Insufficient documentation

## 2018-09-01 DIAGNOSIS — E1122 Type 2 diabetes mellitus with diabetic chronic kidney disease: Secondary | ICD-10-CM | POA: Diagnosis not present

## 2018-09-01 DIAGNOSIS — N184 Chronic kidney disease, stage 4 (severe): Secondary | ICD-10-CM

## 2018-09-01 DIAGNOSIS — Z95810 Presence of automatic (implantable) cardiac defibrillator: Secondary | ICD-10-CM | POA: Diagnosis not present

## 2018-09-01 DIAGNOSIS — Z8249 Family history of ischemic heart disease and other diseases of the circulatory system: Secondary | ICD-10-CM | POA: Insufficient documentation

## 2018-09-01 DIAGNOSIS — E785 Hyperlipidemia, unspecified: Secondary | ICD-10-CM | POA: Insufficient documentation

## 2018-09-01 DIAGNOSIS — R42 Dizziness and giddiness: Secondary | ICD-10-CM | POA: Diagnosis not present

## 2018-09-01 DIAGNOSIS — I255 Ischemic cardiomyopathy: Secondary | ICD-10-CM | POA: Insufficient documentation

## 2018-09-01 DIAGNOSIS — M109 Gout, unspecified: Secondary | ICD-10-CM | POA: Diagnosis not present

## 2018-09-01 LAB — BASIC METABOLIC PANEL
Anion gap: 14 (ref 5–15)
BUN: 28 mg/dL — AB (ref 8–23)
CO2: 26 mmol/L (ref 22–32)
CREATININE: 1.52 mg/dL — AB (ref 0.61–1.24)
Calcium: 9.4 mg/dL (ref 8.9–10.3)
Chloride: 91 mmol/L — ABNORMAL LOW (ref 98–111)
GFR calc Af Amer: 49 mL/min — ABNORMAL LOW (ref >60)
GFR, EST NON AFRICAN AMERICAN: 42 mL/min — AB (ref >60)
GLUCOSE: 144 mg/dL — AB (ref 70–99)
POTASSIUM: 4 mmol/L (ref 3.5–5.1)
SODIUM: 131 mmol/L — AB (ref 135–145)

## 2018-09-01 MED ORDER — EMPAGLIFLOZIN 10 MG PO TABS: 10.0000 mg | ORAL_TABLET | Freq: Every day | ORAL | 3 refills | Status: DC

## 2018-09-01 NOTE — Progress Notes (Signed)
Patient ID: Mason Arnold, male   DOB: 1941-06-26, 77 y.o.   MRN: [id number]   ADVANCED HF CLINIC NOTE PCP: Mason Arnold Primary cardiologist: Dr. Stanford Arnold EP: Dr Mason Arnold  Mason Arnold is a 77 y.o. male with history of CAD s/p CABG 2000, ischemic cardiomyopathy (20%) with St Jude CRT-D, DM2, paroxysmal atrial fibrillation, and recurrent syncope of uncertain etiology.    He had RHC/LHC in 4/15 showing patent grafts but elevated filling pressures and low cardiac index (1.9 Fick/2.2 thermo).   Follow up for Heart Failure: He returns for HF follow up with his wife. Over the past few months, creatinine climbing up slowly.  1.5-> 1.7 -> 1.9 -> 2.2 but was actually back down to 1.8 in 11/18. Last visit had progressive HF symptoms and echo and CPX ordered.  Echo 10/26/17 EF stable at 25-30% Personally reviewed CPX:  10/26/17 showed mild to moderate HF limitation. Ventilatory limited at peak (see below)  He returns today for routine follow up with his wife. Says he is doing pretty well. Has good days and bad days. Has been followed closely by Mason Arnold in [facility]. Takes metolazone about 1x/week when weight up and breathing gets worse. Not very active. Can do ADLs but gets SOB with more. Occasional orthopnea or PND. Drinks a lot of fluid. Weighs daily. BP remains soft in 90s. No syncope.   ICD interrogated personally: No VT/VF. Activity level ~1-2 hr/day. Volume status looks good. 97% BiV paced.  Personally reviewed   CPX 11/18  FVC 2.77 (83%)    FEV1 2.14 (85%)     FEV1/FVC 77 (101%)      MVV 82 (81%) Resting HR: 64 Peak HR: 128  (90% age predicted max HR) BP rest: 100/64 BP peak: 126/60  Peak VO2: 18.5 (76% predicted peak VO2) VE/VCO2 slope: 37  OUES: 1.43 Peak RER: 1.10 Ventilatory Threshold: 14.9 (67% predicted or measured peak VO2) VE/MVV: 74% O2pulse: 12  (100% predicted O2pulse)  Echo 11/18: EF 25-30% ECHO 04/16/2016: EF 20% IVC normal. RV  mildly reduced Echo 8/15: EF 15-20% RV moderate dysfunction.   [place] 04/2016 RA = 7 RV = 48/2/8 PA = 54/20 (33) PCW = 19 Fick cardiac output/index = 4.2/2.4 PVR = 3.3 WU Ao sat = 93% PA sat = 57%, 60%  Last cath 4/15 RA = 9 RV = 69/0/13 PA = 61/24 (38) PCW = 29 Fick cardiac output/index = 3.4/1.9 Thermo CO/CI = 4.1 /2.2 PVR = 2.7 [place] FA sat = 94% PA sat = 57%, 57% Ao Pressure: 104/58 (77) LV Pressure: 103/13/29  Left main: Mild plaque LAD: Totally occluded ostially LCX: Small ramus with high-grade ostial disease. AV groove LCX appears occluded in mid section. OM-1 occluded proximally. RCA: Unable to be cannulated. Suspect occluded ostially.  LV-gram done in the RAO projection: Ejection fraction = 15%. Basal inferior and anterior walls only sections that contract.  LIMA to LAD: Patent SVG to Ramus: Patent SVG to OM-3: Patent SVG to R PDA: Patent. The RCA is occluded prior to take-off of PDA. There are collaterals from distal RCA to OM-1 and probably a septal perforator.  CPX 04/24/14: FVC 2.70 (78%)  FEV1 2.15 (86%)  FEV1/FVC 80%  MVV 84 (80%) Resting HR: 65 Peak HR: 134 (92% age predicted max HR) Peak VO2: 19.6 (76.1% predicted peak VO2) VE/VCO2 slope: 34.8 OUES: 1.58 Peak RER: 1.23 Ventilatory Threshold: 15.0 (58.3% predicted peak VO2) Peak RR 46 Peak Ventilation: 68.1 VE/MVV: 81% PETCO2 at peak: 30 O2pulse:  11 (76% predicted O2pulse)  Labs (4/15): K 4.7, creatinine 1.4 Labs (5/15): K 3.7 Cr 1.56 pBNP 1,353 Labs (8/15): K 4.8, creatinine 1.6, pBNP 724 Labs (11/13/2014): K 3.6 Creatinine 1.79 dig level 0.5   PMH: 1. HTN 2. Hyperlipidemia 3. CAD: s/p CABG with LIMA-LAD, SVG-ramus, SVG-OM3, SVG-PDA.  LHC (4/15) with patent grafts.  4. Ischemic cardiomyopathy: Echo (8/14) with EF 20-25% with regional WMAs, mild MR, mildly decreased RV systolic function.  EF LV-gram (4/15) was 15%. He has a [employer], political party CRT-D device (upgraded 1/14).  RHC (4/15): mean RA 9, PA 61/24  (mean 38), mean PCWP 29, CI 1.9 Fick/2.2 thermo, PVR 2.7. CPX (04/2014): Peak VO2: 19.6 (76% predicted peak VO2), VE/VCO2: 34.8, Peak RER 1.23.  Echo (10/15) with EF 20-25% with regional wall motion abnormalities, normal RV size with mild to moderately decreased systolic function, mild AS, mild MR.  5. Syncopal episodes: Relatively frequent, etiology uncertain.  He does get lightheaded/near-syncopal with coughing spells, but he has syncope at other times also with no warning.  Since device has been in place, he has had VT detected but it has not been clearly correlated with syncopal episodes. Suspect vasomotor.  6. Atrial fibrillation: Paroxysmal.  Has not been anticoagulated due to relatively frequent syncope/falls.  7. Gout 8. H/o LBBB 9. Type II diabetes 10. CKD  SH: Married, lives in [place], nonsmoker, rare ETOH.   FH: CAD  ROS: All systems reviewed and negative except as per HPI.   Current Outpatient Medications  Medication Sig Dispense Refill  . acetaminophen (TYLENOL) 500 MG tablet Take 500 mg by mouth every 6 (six) hours as needed. For pain    . albuterol (PROVENTIL HFA;VENTOLIN HFA) 108 (90 Base) MCG/ACT inhaler Inhale 2 puffs into the lungs every 4 (four) hours as needed for wheezing. 1 Inhaler 6  . allopurinol (ZYLOPRIM) 100 MG tablet Take 1 tablet (100 mg total) by mouth daily at 8 pm. (1900) 30 tablet 2  . calcium carbonate (OS-CAL) 600 MG TABS Take 600 mg by mouth daily after breakfast.     . Carboxymethylcellulose Sodium (THERATEARS) 0.25 % SOLN Place 1 drop into both eyes 3 (three) times daily as needed (for dry eyes.).    Marland Kitchen carvedilol (COREG) 3.125 MG tablet TAKE ONE TABLET BY MOUTH TWICE DAILY WITH MEALS 60 tablet 10  . colchicine 0.6 MG tablet Take 0.6 mg by mouth daily as needed (gout flare up).     . digoxin (LANOXIN) 0.125 MG tablet TAKE ONE TABLET BY MOUTH ONE TIME DAILY  90 tablet 1  . ELIQUIS 5 MG TABS tablet TAKE 1 TABLET BY MOUTH TWICE A DAY 60 tablet 3  .  empagliflozin (JARDIANCE) 10 MG TABS tablet Take 10 mg by mouth daily. 30 tablet 3  . famotidine (PEPCID) 10 MG tablet Take 10 mg by mouth daily before supper. 2 HRS BEFORE SUPPER.    Marland Kitchen FLUoxetine (PROZAC) 20 MG capsule take 1 capsule by mouth daily 90 capsule 0  . furosemide (LASIX) 80 MG tablet Take 80mg  in the AM and 40mg  in the PM    . insulin glargine (LANTUS) 100 UNIT/ML injection Inject 34 Units into the skin daily at 10 pm. (2100)    . loratadine (CLARITIN) 10 MG tablet Take 10 mg by mouth daily as needed. For allergies    . Multiple Vitamin (MULTIVITAMIN) tablet Take 1 tablet by mouth daily after breakfast.     . omega-3 acid ethyl esters (LOVAZA) 1 G capsule Take 2 g  by mouth 2 (two) times daily. (0700 & 1900)    . potassium chloride SA (K-DUR,KLOR-CON) 20 MEQ tablet TAKE ONE TABLET BY MOUTH TWICE DAILY AT 7AM AND 7PM 60 tablet 2  . sacubitril-valsartan (ENTRESTO) 49-51 MG Take 1 tablet by mouth 2 (two) times daily. 60 tablet 6  . spironolactone (ALDACTONE) 25 MG tablet Take 25 mg by mouth daily. (1900)     No current facility-administered medications for this encounter.     BP 90/62   Pulse 68   Wt 70.4 kg (155 lb 4 oz)   SpO2 93%   BMI 25.83 kg/m  Wt Readings from Last 3 Encounters:  09/01/18 70.4 kg (155 lb 4 oz)  05/07/18 77.6 kg (171 lb 1.9 oz)  02/01/18 76.7 kg (169 lb)    General: Well appearing. No resp difficulty. HEENT: Normal anicteric  Neck: Supple. JVP 5-6. Carotids 2+ bilat; no bruits. No thyromegaly or nodule noted. Cor: PMI nondisplaced. RRR, No M/G/R noted Lungs: CTAB, normal effort. No wheeze Abdomen: Soft, non-tender, non-distended, no HSM. No bruits or masses. +BS  Extremities: no cyanosis, clubbing, rash, edema Neuro: alert & oriented x 3, cranial nerves grossly intact. moves all 4 extremities w/o difficulty. Affect pleasant   Assessment/Plan: 1. Chronic systolic CHF: Ischemic cardiomyopathy s/p St Jude CRT-D, EF 20%(04/2016 )  -Echo 11/18 EF  stable 25-30% - CPX 11/18 Peak VO2: 18.5 (76% predicted peak VO2) VE/VCO2 slope: 37  - ICD interrogated. 97% BiV pacing volume status ok.  - Volume status stable on exam. Stable NYHA class II-III - Check BMT - Continue Entresto 49/51.  - Continue spironolactone 25, digoxin and Coreg 3.125 mg BID.  BP too soft to titrate 2. CAD:  - Doing well. No s/s ischemia -Patent grafts on LHC in 4/15. Continue statin.  With Eliquis so no longer taking aspirin. Can drop dose to 2.5 bid when he turns 79    3. Paroxysmal atrial fibrillation:  - ICD interrogated personally. No PAF - Continue eliquis 5 mg twice a day. No bleeding  4. H/o Recurrent syncope  - Suspect cough syncope. Quiescent 5. ICD  --Follows with Dr. Caryl Arnold.  6. CKD, III-IV - Creatinine improved on last check. BMET today.  7. Dizziness on turning head to right - No carotid bruits or hypersensitvity on exam Will check carotid u/s. - R ICA ok. L 1-39% on 1/19 8. DM2 - followed by PCP - consider Jardiance No change.   Daniel Bensimhon NP 09/01/2018

## 2018-09-01 NOTE — Addendum Note (Signed)
Encounter addended by: Harvie Junior, CMA on: 09/01/2018 10:54 AM  Actions taken: Diagnosis association updated, Pharmacy for encounter modified, Order list changed, Sign clinical note

## 2018-09-01 NOTE — Patient Instructions (Signed)
Routine lab work today. Will notify you of abnormal results  Follow up with Dr.Bensimhon in 3-4 months.

## 2018-09-10 ENCOUNTER — Ambulatory Visit (INDEPENDENT_AMBULATORY_CARE_PROVIDER_SITE_OTHER): Payer: Medicare Other

## 2018-09-10 DIAGNOSIS — Z23 Encounter for immunization: Secondary | ICD-10-CM | POA: Diagnosis not present

## 2018-09-16 ENCOUNTER — Ambulatory Visit: Payer: Medicare Other

## 2018-09-16 DIAGNOSIS — Z95810 Presence of automatic (implantable) cardiac defibrillator: Secondary | ICD-10-CM

## 2018-09-16 DIAGNOSIS — I5022 Chronic systolic (congestive) heart failure: Secondary | ICD-10-CM

## 2018-09-17 ENCOUNTER — Telehealth: Payer: Self-pay | Admitting: *Deleted

## 2018-09-17 NOTE — Telephone Encounter (Signed)
LVM informing patient that his remote for 10/24 will still be processed despite his appt with Dr.Klein. I instructed patient to call back with any further questions.

## 2018-09-17 NOTE — Progress Notes (Signed)
EPIC Encounter for ICM Monitoring  Patient Name: Mason Arnold is a 77 y.o. male Date: 09/17/2018 Primary Care Physican: Binnie Rail, MD Primary Cardiologist:[facility] [place] Electrophysiologist: Caryl Comes [facility]rologist: [facility] Weight:151lbs(baseline closer to 157-158 lbs) Bi-V Pacing: 97%         Heart Failure questions reviewed, pt asymptomatic.  Patient says he takes Metolazone but it is no longer listed in current medication list.   Message sent to Dr Aquilla Hacker nurse to have Dr Caryl Comes discuss with patient at the 09/30/2018 office visit.     Thoracic impedance normal.   Prescribed: Furosemide80 mg in the morning and 40 mg in the evening.    Labs: 09/01/2018 Creatinine 1.52, BUN 28, Potassium 4.0, Sodium 131, eGFR 42-49 05/07/2018 Creatinine 1.47, BUN 15, Potassium 4.3, Sodium 138, EGFR 44-51 12/11/2017 Creatinine 1.37, BUN 18, Potassium 3.9, Sodium 134, EGFR 48-56  Recommendations: No changes.   Encouraged to call for fluid symptoms.  Follow-up plan: ICM clinic phone appointment on 11/01/2018.    Copy of ICM check sent to Dr. Caryl Comes.   3 month ICM trend: 09/16/2018    AT/AF   1 Year ICM trend:       Rosalene Billings, RN 09/17/2018 12:41 PM

## 2018-09-17 NOTE — Telephone Encounter (Signed)
-----   Message from Rosalene Billings, RN sent at 09/17/2018  4:22 PM EDT ----- Regarding: 91 day remote Patient is active on mychart.  He is asking if he needs to do the remote transmission scheduled on same day as office visit with Dr Caryl Comes.  I told him I would let you guys look at it and make a decision about it.    Thanks Margarita Grizzle

## 2018-09-22 ENCOUNTER — Other Ambulatory Visit: Payer: Self-pay | Admitting: Internal Medicine

## 2018-09-30 ENCOUNTER — Encounter: Payer: Self-pay | Admitting: Internal Medicine

## 2018-09-30 ENCOUNTER — Telehealth: Payer: Self-pay

## 2018-09-30 ENCOUNTER — Ambulatory Visit: Payer: Medicare Other | Admitting: Internal Medicine

## 2018-09-30 ENCOUNTER — Ambulatory Visit (INDEPENDENT_AMBULATORY_CARE_PROVIDER_SITE_OTHER): Payer: Medicare Other | Admitting: *Deleted

## 2018-09-30 VITALS — BP 104/68 | HR 68 | Ht 65.0 in | Wt 159.8 lb

## 2018-09-30 DIAGNOSIS — I480 Paroxysmal atrial fibrillation: Secondary | ICD-10-CM

## 2018-09-30 DIAGNOSIS — I472 Ventricular tachycardia, unspecified: Secondary | ICD-10-CM

## 2018-09-30 DIAGNOSIS — I255 Ischemic cardiomyopathy: Secondary | ICD-10-CM | POA: Diagnosis not present

## 2018-09-30 DIAGNOSIS — Z95810 Presence of automatic (implantable) cardiac defibrillator: Secondary | ICD-10-CM | POA: Diagnosis not present

## 2018-09-30 DIAGNOSIS — I5022 Chronic systolic (congestive) heart failure: Secondary | ICD-10-CM | POA: Diagnosis not present

## 2018-09-30 LAB — CUP PACEART INCLINIC DEVICE CHECK
Brady Statistic RA Percent Paced: 92 %
Brady Statistic RV Percent Paced: 97 %
HIGH POWER IMPEDANCE MEASURED VALUE: 43.5764
Implantable Lead Implant Date: 20020513
Implantable Lead Implant Date: 20140109
Implantable Lead Model: 148
Implantable Lead Model: 5076
Lead Channel Impedance Value: 450 Ohm
Lead Channel Impedance Value: 525 Ohm
Lead Channel Impedance Value: 612.5 Ohm
Lead Channel Pacing Threshold Amplitude: 1 V
Lead Channel Pacing Threshold Amplitude: 1.25 V
Lead Channel Pacing Threshold Amplitude: 1.25 V
Lead Channel Pacing Threshold Pulse Width: 0.4 ms
Lead Channel Pacing Threshold Pulse Width: 0.6 ms
Lead Channel Pacing Threshold Pulse Width: 0.8 ms
Lead Channel Pacing Threshold Pulse Width: 0.8 ms
Lead Channel Sensing Intrinsic Amplitude: 12 mV
Lead Channel Sensing Intrinsic Amplitude: 3.8 mV
Lead Channel Setting Pacing Amplitude: 2.5 V
Lead Channel Setting Pacing Amplitude: 2.5 V
Lead Channel Setting Pacing Pulse Width: 0.6 ms
Lead Channel Setting Pacing Pulse Width: 0.8 ms
Lead Channel Setting Sensing Sensitivity: 0.5 mV
MDC IDC LEAD IMPLANT DT: 20020513
MDC IDC LEAD LOCATION: 753858
MDC IDC LEAD LOCATION: 753859
MDC IDC LEAD LOCATION: 753860
MDC IDC LEAD SERIAL: 117908
MDC IDC MSMT BATTERY REMAINING LONGEVITY: 56 mo
MDC IDC MSMT LEADCHNL LV PACING THRESHOLD AMPLITUDE: 1.25 V
MDC IDC MSMT LEADCHNL LV PACING THRESHOLD PULSEWIDTH: 0.6 ms
MDC IDC MSMT LEADCHNL RA PACING THRESHOLD AMPLITUDE: 1.25 V
MDC IDC MSMT LEADCHNL RA PACING THRESHOLD PULSEWIDTH: 0.4 ms
MDC IDC MSMT LEADCHNL RV PACING THRESHOLD AMPLITUDE: 1 V
MDC IDC PG IMPLANT DT: 20180709
MDC IDC PG SERIAL: 7398129
MDC IDC SESS DTM: 20191024155613
MDC IDC SET LEADCHNL LV PACING AMPLITUDE: 2.5 V

## 2018-09-30 MED ORDER — METOLAZONE 5 MG PO TABS: 5.0000 mg | ORAL_TABLET | ORAL | 1 refills | Status: DC

## 2018-09-30 NOTE — Progress Notes (Signed)
Remote ICD transmission.   

## 2018-09-30 NOTE — Patient Instructions (Addendum)
Medication Instructions:  Your physician recommends that you continue on your current medications as directed. Please refer to the Current Medication list given to you today.  Labwork: None ordered.  Testing/Procedures: None ordered.  Follow-Up: Your physician recommends that you schedule a follow-up appointment in:   One Year with Dr Caryl Comes  Remote monitoring is used to monitor your Pacemaker of ICD from home. This monitoring reduces the number of office visits required to check your device to one time per year. It allows [place] to keep an eye on the functioning of your device to ensure it is working properly. You are scheduled for a device check from home on 12/30/2018. You may send your transmission at any time that day. If you have a wireless device, the transmission will be sent automatically. After your physician reviews your transmission, you will receive a postcard with your next transmission date.    Any Other Special Instructions Will Be Listed Below (If Applicable).     If you need a refill on your cardiac medications before your next appointment, please call your pharmacy.

## 2018-09-30 NOTE — Progress Notes (Signed)
[facility] Patient Care Team: Binnie Rail, MD as PCP - General (Internal Medicine)   HPI  Mason Arnold is a 77 y.o. male he is seen in followup for ischemic cardiomyopathy status post coronary bypass graft, he is status post ICD implantation; underwent generator replacement in October 2010.  His last Myoview was performed in July of 2011. At that time, he had an ejection fraction of 28%. There was a previous infarct involving the anterior wall, apex, and inferior wall with minimal ischemia noted  He underwent catheterization 4/15 with patent grafts.  Echocardiogram in July of 2013 showed an ejection fraction of 20-25%. There was mild left ventricular enlargement, Moderate to severe left atrial enlargement and mildly reduced RV function   Echo 8/16 EF 15-20% RHC >>RA = 7 RV = 48/2/8 PA = 54/20 (33) PCW = 26 December 2012 he underwent CRT upgrade       Date Cr K Dig Hgb  1 /18 1.27 4.3 0.2    8/18 1.71 3.8  15.9  9/19 1.52 4.0 0.3(5/19)    Without chest pain.  Does have shortness of breath and fatigue.  Occasional peripheral edema.  Requires PRN Zaroxolyn.   He fell a couple weeks ago in the bathroom.  He hit his head on the bathroom wall.  No headache.  He has problems with orthostatic hypotension and falls.  Past Medical History:  Diagnosis Date  . Anginal pain ([place])   . Congestive heart failure ([place])   . Diabetes mellitus type II    IDDM , [facility]  . Gout   . Hyperlipidemia   . Hypertension   . Ischemic heart disease   . Left bundle branch block   . Myocardial infarction ([place]) 2000; 2002; 2004; ~ 2006  . Ventricular tachycardia [facility])     Past Surgical History:  Procedure Laterality Date  . A-V CARDIAC PACEMAKER INSERTION  2002; 2010  . BIV ICD GENERATOR CHANGEOUT N/A 06/15/2017   Procedure: BiV ICD Generator Changeout;  Surgeon: Deboraha Sprang, MD;  Location: [facility] CV LAB;  Service: Cardiovascular;  Laterality: N/A;  . BIV ICD GENERTAOR CHANGE OUT N/A 12/16/2012    Procedure: BIV ICD GENERTAOR CHANGE OUT;  Surgeon: Deboraha Sprang, MD;  Location: [facility] CATH LAB;  Service: Cardiovascular;  Laterality: N/A;  . CARDIAC CATHETERIZATION     "3 or 4; never had interventions" (12/16/2012)  . CARDIAC CATHETERIZATION N/A 04/11/2016   Procedure: Right Heart Cath;  Surgeon: Jolaine Artist, MD;  Location: [facility] CV LAB;  Service: Cardiovascular;  Laterality: N/A;  . CORONARY ARTERY BYPASS GRAFT  2000   CABG X4  . INSERT / REPLACE / REMOVE PACEMAKER  12/16/2012   LV lead placement; ICD assessment   . LEFT AND RIGHT HEART CATHETERIZATION WITH CORONARY/GRAFT ANGIOGRAM N/A 04/04/2014   Procedure: LEFT AND RIGHT HEART CATHETERIZATION WITH Beatrix Fetters;  Surgeon: Jolaine Artist, MD;  Location[facility] CATH LAB;  Service: Cardiovascular;  Laterality: N/A;  . [place]  . URACHAL CYST EXCISION  ~ 1995  . VASECTOMY  ?1967    Current Outpatient Medications  Medication Sig Dispense Refill  . acetaminophen (TYLENOL) 500 MG tablet Take 500 mg by mouth every 6 (six) hours as needed. For pain    . albuterol (PROVENTIL HFA;VENTOLIN HFA) 108 (90 Base) MCG/ACT inhaler Inhale 2 puffs into the lungs every 4 (four) hours as needed for wheezing. 1 Inhaler 6  . allopurinol (ZYLOPRIM) 100 MG tablet Take 1  tablet (100 mg total) by mouth daily at 8 pm. (1900) 30 tablet 2  . calcium carbonate (OS-CAL) 600 MG TABS Take 600 mg by mouth daily after breakfast.     . Carboxymethylcellulose Sodium (THERATEARS) 0.25 % SOLN Place 1 drop into both eyes 3 (three) times daily as needed (for dry eyes.).    Marland Kitchen carvedilol (COREG) 3.125 MG tablet TAKE ONE TABLET BY MOUTH TWICE DAILY WITH MEALS 60 tablet 10  . colchicine 0.6 MG tablet Take 0.6 mg by mouth daily as needed (gout flare up).     . digoxin (LANOXIN) 0.125 MG tablet TAKE ONE TABLET BY MOUTH ONE TIME DAILY  90 tablet 1  . ELIQUIS 5 MG TABS tablet TAKE 1 TABLET BY MOUTH TWICE A DAY 60 tablet 3  .  empagliflozin (JARDIANCE) 10 MG TABS tablet Take 10 mg by mouth daily. 30 tablet 3  . famotidine (PEPCID) 10 MG tablet Take 10 mg by mouth daily before supper. 2 HRS BEFORE SUPPER.    Marland Kitchen FLUoxetine (PROZAC) 20 MG capsule take 1 capsule by mouth daily 90 capsule 0  . furosemide (LASIX) 80 MG tablet Take 80mg  in the AM and 40mg  in the PM    . insulin glargine (LANTUS) 100 UNIT/ML injection Inject 34 Units into the skin daily at 10 pm. (2100)    . loratadine (CLARITIN) 10 MG tablet Take 10 mg by mouth daily as needed. For allergies    . Multiple Vitamin (MULTIVITAMIN) tablet Take 1 tablet by mouth daily after breakfast.     . omega-3 acid ethyl esters (LOVAZA) 1 G capsule Take 2 g by mouth 2 (two) times daily. (0700 & 1900)    . potassium chloride SA (K-DUR,KLOR-CON) 20 MEQ tablet TAKE ONE TABLET BY MOUTH TWICE DAILY AT 7AM AND 7PM 60 tablet 2  . sacubitril-valsartan (ENTRESTO) 49-51 MG Take 1 tablet by mouth 2 (two) times daily. 60 tablet 6  . spironolactone (ALDACTONE) 25 MG tablet Take 25 mg by mouth daily. (1900)     No current facility-administered medications for this visit.     Allergies  Allergen Reactions  . Ampicillin Rash    Has patient had a PCN reaction causing immediate rash, facial/tongue/throat swelling, SOB or lightheadedness with hypotension:No Has patient had a PCN reaction causing severe rash involving mucus membranes or skin necrosis: No Has patient had a PCN reaction that required hospitalization: No Has patient had a PCN reaction occurring within the last 10 years: No If all of the above answers are "NO", then may proceed with Cephalosporin use.   Alveta Heimlich [Rosuvastatin Calcium] Rash and Other (See Comments)    Rash as nodules ("like strawberries")  . Orange Fruit [Citrus] Swelling and Other (See Comments)    Lips swelling, severe headache (including lemons and other citrus fruits)  . Propoxyphene N-Acetaminophen Other (See Comments)    hallucinations  [darvicet]     Review of Systems negative except from HPI and PMH  Physical Exam BP 104/68   Pulse 68   Ht 5\' 5"  (1.651 m)   Wt 159 lb 12.8 oz (72.5 kg)   SpO2 94%   BMI 26.59 kg/m  Well developed and nourished in no acute distress HENT normal Neck supple with JVP-flat Clear Regular rate and rhythm, no murmurs or gallops Abd-soft with active BS No Clubbing cyanosis tr edema Skin-warm and dry A & Oriented  Grossly normal sensory and motor function    ECG dated today AV pacing 11/22/48 Upright QRS lead V1  negative lead I  Assessment and  Plan  Ischemic cardiomyopathy  Congestive heart failure  Implantable defibrillator-St. Jude-CRT  .biv pacing  Atrial fibrillation  Ventricular tachycardia  Syncope-orthostatic  Renal insufficiency grade 3 Est GFR 40    No intercurrent Ventricular tachycardia  Intercurrent afib but spontaneous reversion to sinus  Trace volume overload  Continue current diuretics  Without symptoms of ischemia  Orthostatic intolerance.  We have reviewed the importance of isometric contraction prior to standing; suggested raising the head of his bed.  If he has any headaches, he should have a scan as he is had on anticoagulation.

## 2018-09-30 NOTE — Telephone Encounter (Signed)
Spoke with pt and reminded pt of remote transmission that is due today. Pt verbalized understanding.   

## 2018-10-13 ENCOUNTER — Other Ambulatory Visit (HOSPITAL_COMMUNITY): Payer: Self-pay | Admitting: Cardiology

## 2018-10-22 LAB — CUP PACEART REMOTE DEVICE CHECK
Battery Remaining Longevity: 59 mo
Brady Statistic AP VP Percent: 92 %
Brady Statistic AP VS Percent: 2.2 %
Brady Statistic AS VS Percent: 1 %
HIGH POWER IMPEDANCE MEASURED VALUE: 44 Ohm
HighPow Impedance: 44 Ohm
Implantable Lead Implant Date: 20020513
Implantable Lead Implant Date: 20140109
Implantable Lead Location: 753860
Implantable Lead Model: 148
Implantable Lead Model: 5076
Lead Channel Impedance Value: 550 Ohm
Lead Channel Pacing Threshold Amplitude: 2 V
Lead Channel Pacing Threshold Pulse Width: 0.6 ms
Lead Channel Pacing Threshold Pulse Width: 0.8 ms
Lead Channel Sensing Intrinsic Amplitude: 3.8 mV
Lead Channel Setting Pacing Amplitude: 2.5 V
Lead Channel Setting Pacing Amplitude: 2.5 V
Lead Channel Setting Pacing Amplitude: 2.5 V
Lead Channel Setting Pacing Pulse Width: 0.6 ms
Lead Channel Setting Pacing Pulse Width: 0.8 ms
Lead Channel Setting Sensing Sensitivity: 0.5 mV
MDC IDC LEAD IMPLANT DT: 20020513
MDC IDC LEAD LOCATION: 753858
MDC IDC LEAD LOCATION: 753859
MDC IDC LEAD SERIAL: 117908
MDC IDC MSMT BATTERY REMAINING PERCENTAGE: 78 %
MDC IDC MSMT BATTERY VOLTAGE: 2.96 V
MDC IDC MSMT LEADCHNL LV IMPEDANCE VALUE: 460 Ohm
MDC IDC MSMT LEADCHNL RA PACING THRESHOLD AMPLITUDE: 1.25 V
MDC IDC MSMT LEADCHNL RA PACING THRESHOLD PULSEWIDTH: 0.4 ms
MDC IDC MSMT LEADCHNL RV IMPEDANCE VALUE: 630 Ohm
MDC IDC MSMT LEADCHNL RV PACING THRESHOLD AMPLITUDE: 1 V
MDC IDC MSMT LEADCHNL RV SENSING INTR AMPL: 11.3 mV
MDC IDC PG IMPLANT DT: 20180709
MDC IDC PG SERIAL: 7398129
MDC IDC SESS DTM: 20191024151512
MDC IDC STAT BRADY AS VP PERCENT: 5.1 %
MDC IDC STAT BRADY RA PERCENT PACED: 92 %

## 2018-10-26 ENCOUNTER — Other Ambulatory Visit: Payer: Self-pay | Admitting: Internal Medicine

## 2018-10-26 DIAGNOSIS — I480 Paroxysmal atrial fibrillation: Secondary | ICD-10-CM

## 2018-11-01 ENCOUNTER — Ambulatory Visit (INDEPENDENT_AMBULATORY_CARE_PROVIDER_SITE_OTHER): Payer: Medicare Other

## 2018-11-01 DIAGNOSIS — Z95810 Presence of automatic (implantable) cardiac defibrillator: Secondary | ICD-10-CM | POA: Diagnosis not present

## 2018-11-01 DIAGNOSIS — I5022 Chronic systolic (congestive) heart failure: Secondary | ICD-10-CM | POA: Diagnosis not present

## 2018-11-02 NOTE — Progress Notes (Signed)
EPIC Encounter for ICM Monitoring  Patient Name: Mason Arnold is a 77 y.o. male Date: 11/02/2018 Primary Care Physican: Binnie Rail, MD Primary Cardiologist:[facility] [place] Electrophysiologist: Caryl Comes [facility]rologist: [facility] Last Weight:154lbs Bi-V Pacing: 94%                                          Heart Failure questions reviewed, pt had weight gain in the last few days.  Patient took Metolazone 11/01/2018 for weight gain.   Thoracic impedance normal.   Prescribed: Furosemide80 mg in the morning and 40 mg in the evening.    Labs: 09/01/2018 Creatinine 1.52, BUN 28, Potassium 4.0, Sodium 131, eGFR 42-49 05/07/2018 Creatinine 1.47, BUN 15, Potassium 4.3, Sodium 138, EGFR 44-51 12/11/2017 Creatinine 1.37, BUN 18, Potassium 3.9, Sodium 134, EGFR 48-56  Recommendations: No changes.   Encouraged to call for fluid symptoms.  Follow-up plan: ICM clinic phone appointment on 12/30/2018.  Office appointment with Dr Haroldine Laws 12/02/2018  Copy of ICM check sent to Dr. Caryl Comes.   3 month ICM trend:   1 Year ICM trend:       Rosalene Billings, RN 11/02/2018 5:14 PM

## 2018-11-23 ENCOUNTER — Other Ambulatory Visit: Payer: Self-pay | Admitting: Internal Medicine

## 2018-11-23 DIAGNOSIS — I480 Paroxysmal atrial fibrillation: Secondary | ICD-10-CM

## 2018-12-02 ENCOUNTER — Encounter: Payer: Self-pay | Admitting: Internal Medicine

## 2018-12-02 ENCOUNTER — Other Ambulatory Visit: Payer: Self-pay | Admitting: Family

## 2018-12-02 ENCOUNTER — Encounter (HOSPITAL_COMMUNITY): Payer: Self-pay | Admitting: Internal Medicine

## 2018-12-02 ENCOUNTER — Ambulatory Visit (HOSPITAL_COMMUNITY)
Admission: RE | Admit: 2018-12-02 | Discharge: 2018-12-02 | Disposition: A | Discharge status: Home / Self Care | Payer: Medicare Other | Source: Ambulatory Visit | Attending: Internal Medicine | Admitting: Internal Medicine

## 2018-12-02 VITALS — BP 122/60 | HR 85 | Wt 159.6 lb

## 2018-12-02 DIAGNOSIS — E785 Hyperlipidemia, unspecified: Secondary | ICD-10-CM | POA: Insufficient documentation

## 2018-12-02 DIAGNOSIS — I480 Paroxysmal atrial fibrillation: Secondary | ICD-10-CM | POA: Insufficient documentation

## 2018-12-02 DIAGNOSIS — R42 Dizziness and giddiness: Secondary | ICD-10-CM | POA: Insufficient documentation

## 2018-12-02 DIAGNOSIS — I130 Hypertensive heart and chronic kidney disease with heart failure and stage 1 through stage 4 chronic kidney disease, or unspecified chronic kidney disease: Secondary | ICD-10-CM | POA: Insufficient documentation

## 2018-12-02 DIAGNOSIS — Z95810 Presence of automatic (implantable) cardiac defibrillator: Secondary | ICD-10-CM | POA: Insufficient documentation

## 2018-12-02 DIAGNOSIS — Z7901 Long term (current) use of anticoagulants: Secondary | ICD-10-CM | POA: Insufficient documentation

## 2018-12-02 DIAGNOSIS — I2510 Atherosclerotic heart disease of native coronary artery without angina pectoris: Secondary | ICD-10-CM | POA: Insufficient documentation

## 2018-12-02 DIAGNOSIS — I5022 Chronic systolic (congestive) heart failure: Secondary | ICD-10-CM | POA: Diagnosis not present

## 2018-12-02 DIAGNOSIS — Z79899 Other long term (current) drug therapy: Secondary | ICD-10-CM | POA: Diagnosis not present

## 2018-12-02 DIAGNOSIS — R55 Syncope and collapse: Secondary | ICD-10-CM | POA: Insufficient documentation

## 2018-12-02 DIAGNOSIS — Z8249 Family history of ischemic heart disease and other diseases of the circulatory system: Secondary | ICD-10-CM | POA: Diagnosis not present

## 2018-12-02 DIAGNOSIS — Z951 Presence of aortocoronary bypass graft: Secondary | ICD-10-CM | POA: Insufficient documentation

## 2018-12-02 DIAGNOSIS — E1122 Type 2 diabetes mellitus with diabetic chronic kidney disease: Secondary | ICD-10-CM | POA: Diagnosis not present

## 2018-12-02 DIAGNOSIS — Z794 Long term (current) use of insulin: Secondary | ICD-10-CM | POA: Insufficient documentation

## 2018-12-02 DIAGNOSIS — M109 Gout, unspecified: Secondary | ICD-10-CM | POA: Insufficient documentation

## 2018-12-02 DIAGNOSIS — I255 Ischemic cardiomyopathy: Secondary | ICD-10-CM | POA: Insufficient documentation

## 2018-12-02 DIAGNOSIS — N184 Chronic kidney disease, stage 4 (severe): Secondary | ICD-10-CM | POA: Diagnosis not present

## 2018-12-02 LAB — BASIC METABOLIC PANEL
ANION GAP: 14 (ref 5–15)
BUN: 30 mg/dL — ABNORMAL HIGH (ref 8–23)
CALCIUM: 9.4 mg/dL (ref 8.9–10.3)
CO2: 28 mmol/L (ref 22–32)
Chloride: 93 mmol/L — ABNORMAL LOW (ref 98–111)
Creatinine, Ser: 1.77 mg/dL — ABNORMAL HIGH (ref 0.61–1.24)
GFR, EST AFRICAN AMERICAN: 42 mL/min — AB (ref >60)
GFR, EST NON AFRICAN AMERICAN: 36 mL/min — AB (ref >60)
Glucose, Bld: 137 mg/dL — ABNORMAL HIGH (ref 70–99)
Potassium: 4.9 mmol/L (ref 3.5–5.1)
Sodium: 135 mmol/L (ref 135–145)

## 2018-12-02 LAB — DIGOXIN LEVEL: Digoxin Level: 0.5 ng/mL — ABNORMAL LOW (ref 0.8–2.0)

## 2018-12-02 MED ORDER — ALBUTEROL SULFATE HFA 108 (90 BASE) MCG/ACT IN AERS: 2.0000 | INHALATION_SPRAY | RESPIRATORY_TRACT | 2 refills | Status: AC | PRN

## 2018-12-02 NOTE — Progress Notes (Signed)
Patient ID: Mason Arnold, male   DOB: January 14, 1941, 77 y.o.   MRN: [id number]   ADVANCED HF CLINIC NOTE PCP: [facility] Primary cardiologist: Dr. Stanford Breed EP: Dr Caryl Comes  [name] is a 77 y.o. male with history of CAD s/p CABG 2000, ischemic cardiomyopathy (20%) with [employer] CRT-D, DM2, paroxysmal atrial fibrillation, and recurrent syncope of uncertain etiology.    He had RHC/LHC in 4/15 showing patent grafts but elevated filling pressures and low cardiac index (1.9 Fick/2.2 thermo).   Echo 10/26/17 EF stable at 25-30% Personally reviewed CPX:  10/26/17 showed mild to moderate HF limitation. Ventilatory limited at peak (see below)  He returns today for 3 month follow up. Overall doing okay. He gets SOB when talking on the phone on occasion. Only mild SOB when walking around, having more difficulty walking to mailbox. No syncope. He gets dizzy "off and on", sometimes with standing and sometimes with sitting. + 2 pillow orthopnea. No CP. Weights 152-157 lbs at home. Takes PRN metolazone about once/week. Takes all medications. "stays hydrated" per kidney doctor, but less than 2 L daily. Limits salt intake.   ICD interrogated personally: 2.8% Afib. No VT/VF. Active 1-2 hours/day. 95% BiV pacing.   CPX 11/18  FVC 2.77 (83%)    FEV1 2.14 (85%)     FEV1/FVC 77 (101%)      MVV 82 (81%) Resting HR: 64 Peak HR: 128  (90% age predicted max HR) BP rest: 100/64 BP peak: 126/60  Peak VO2: 18.5 (76% predicted peak VO2) VE/VCO2 slope: 37  OUES: 1.43 Peak RER: 1.10 Ventilatory Threshold: 14.9 (67% predicted or measured peak VO2) VE/MVV: 74% O2pulse: 12  (100% predicted O2pulse)  Echo 11/18: EF 25-30% ECHO 04/16/2016: EF 20% IVC normal. RV mildly reduced Echo 8/15: EF 15-20% RV moderate dysfunction.   [employer] 04/2016 RA = 7 RV = 48/2/8 PA = 54/20 (33) PCW = 19 Fick cardiac output/index = 4.2/2.4 PVR = 3.3 WU Ao sat = 93% PA sat = 57%, 60%  Last cath  4/15 RA = 9 RV = 69/0/13 PA = 61/24 (38) PCW = 29 Fick cardiac output/index = 3.4/1.9 Thermo CO/CI = 4.1 /2.2 PVR = 2.7 [place] FA sat = 94% PA sat = 57%, 57% Ao Pressure: 104/58 (77) LV Pressure: 103/13/29  Left main: Mild plaque LAD: Totally occluded ostially LCX: Small ramus with high-grade ostial disease. AV groove LCX appears occluded in mid section. OM-1 occluded proximally. RCA: Unable to be cannulated. Suspect occluded ostially.  LV-gram done in the RAO projection: Ejection fraction = 15%. Basal inferior and anterior walls only sections that contract.  LIMA to LAD: Patent SVG to Ramus: Patent SVG to OM-3: Patent SVG to R PDA: Patent. The RCA is occluded prior to take-off of PDA. There are collaterals from distal RCA to OM-1 and probably a septal perforator.  CPX 04/24/14: FVC 2.70 (78%)  FEV1 2.15 (86%)  FEV1/FVC 80%  MVV 84 (80%) Resting HR: 65 Peak HR: 134 (92% age predicted max HR) Peak VO2: 19.6 (76.1% predicted peak VO2) VE/VCO2 slope: 34.8 OUES: 1.58 Peak RER: 1.23 Ventilatory Threshold: 15.0 (58.3% predicted peak VO2) Peak RR 46 Peak Ventilation: 68.1 VE/MVV: 81% PETCO2 at peak: 30 O2pulse: 11 (76% predicted O2pulse)  Labs (4/15): K 4.7, creatinine 1.4 Labs (5/15): K 3.7 Cr 1.56 pBNP 1,353 Labs (8/15): K 4.8, creatinine 1.6, pBNP 724 Labs (11/13/2014): K 3.6 Creatinine 1.79 dig level 0.5   PMH: 1. HTN 2. Hyperlipidemia 3. CAD: s/p CABG with  LIMA-LAD, SVG-ramus, SVG-OM3, SVG-PDA.  LHC (4/15) with patent grafts.  4. Ischemic cardiomyopathy: Echo (8/14) with EF 20-25% with regional WMAs, mild MR, mildly decreased RV systolic function.  EF LV-gram (4/15) was 15%. He has a [employer] officer, political party CRT-D device (upgraded 1/14).  RHC (4/15): mean RA 9, PA 61/24 (mean 38), mean PCWP 29, CI 1.9 Fick/2.2 thermo, PVR 2.7. CPX (04/2014): Peak VO2: 19.6 (76% predicted peak VO2), VE/VCO2: 34.8, Peak RER 1.23.  Echo (10/15) with EF 20-25% with regional wall motion abnormalities, normal RV  size with mild to moderately decreased systolic function, mild AS, mild MR.  5. Syncopal episodes: Relatively frequent, etiology uncertain.  He does get lightheaded/near-syncopal with coughing spells, but he has syncope at other times also with no warning.  Since device has been in place, he has had VT detected but it has not been clearly correlated with syncopal episodes. Suspect vasomotor.  6. Atrial fibrillation: Paroxysmal.  Has not been anticoagulated due to relatively frequent syncope/falls.  7. Gout 8. H/o LBBB 9. Type II diabetes 10. CKD  SH: Married, lives in [place], nonsmoker, rare ETOH.   FH: CAD  Review of systems complete and found to be negative unless listed in HPI.   Current Outpatient Medications  Medication Sig Dispense Refill  . acetaminophen (TYLENOL) 500 MG tablet Take 500 mg by mouth every 6 (six) hours as needed. For pain    . albuterol (PROVENTIL HFA;VENTOLIN HFA) 108 (90 Base) MCG/ACT inhaler Inhale 2 puffs into the lungs every 4 (four) hours as needed for wheezing. 1 Inhaler 6  . allopurinol (ZYLOPRIM) 100 MG tablet Take 1 tablet (100 mg total) by mouth daily at 8 pm. (1900) 30 tablet 2  . calcium carbonate (OS-CAL) 600 MG TABS Take 600 mg by mouth daily after breakfast.     . Carboxymethylcellulose Sodium (THERATEARS) 0.25 % SOLN Place 1 drop into both eyes 3 (three) times daily as needed (for dry eyes.).    Marland Kitchen carvedilol (COREG) 3.125 MG tablet TAKE ONE TABLET BY MOUTH TWICE DAILY WITH MEALS 60 tablet 10  . colchicine 0.6 MG tablet Take 0.6 mg by mouth daily as needed (gout flare up).     . digoxin (LANOXIN) 0.125 MG tablet TAKE ONE TABLET BY MOUTH ONE TIME DAILY  90 tablet 1  . ELIQUIS 5 MG TABS tablet TAKE 1 TABLET BY MOUTH TWICE A DAY 60 tablet 3  . empagliflozin (JARDIANCE) 10 MG TABS tablet Take 10 mg by mouth daily. 30 tablet 3  . famotidine (PEPCID) 10 MG tablet Take 10 mg by mouth daily before supper. 2 HRS BEFORE SUPPER.    Marland Kitchen FLUoxetine (PROZAC) 20  MG capsule TAKE ONE CAPSULE BY MOUTH ONE TIME DAILY  30 capsule 0  . furosemide (LASIX) 80 MG tablet Take 80mg  in the AM and 40mg  in the PM    . insulin glargine (LANTUS) 100 UNIT/ML injection Inject 34 Units into the skin daily at 10 pm. (2100)    . loratadine (CLARITIN) 10 MG tablet Take 10 mg by mouth daily as needed. For allergies    . metolazone (ZAROXOLYN) 5 MG tablet Take 1 tablet (5 mg total) by mouth once a week. 25 tablet 1  . Multiple Vitamin (MULTIVITAMIN) tablet Take 1 tablet by mouth daily after breakfast.     . omega-3 acid ethyl esters (LOVAZA) 1 G capsule Take 2 g by mouth 2 (two) times daily. (0700 & 1900)    . potassium chloride SA (K-DUR,KLOR-CON) 20 MEQ tablet TAKE  ONE TABLET BY MOUTH TWICE DAILY AT 7AM AND 7PM 60 tablet 3  . sacubitril-valsartan (ENTRESTO) 49-51 MG Take 1 tablet by mouth 2 (two) times daily. 60 tablet 6  . spironolactone (ALDACTONE) 25 MG tablet Take 25 mg by mouth daily. (1900)     No current facility-administered medications for this encounter.     BP 122/60   Pulse 85   Wt 72.4 kg (159 lb 9.6 oz)   SpO2 96%   BMI 26.56 kg/m  Wt Readings from Last 3 Encounters:  12/02/18 72.4 kg (159 lb 9.6 oz)  09/30/18 72.5 kg (159 lb 12.8 oz)  09/01/18 70.4 kg (155 lb 4 oz)   General: Well appearing. No resp difficulty. HEENT: Normal Neck: Supple. JVP 5-6. Carotids 2+ bilat; no bruits. No thyromegaly or nodule noted. Cor: PMI nondisplaced. RRR, No M/G/R noted Lungs: CTAB, normal effort. Abdomen: Soft, non-tender, non-distended, no HSM. No bruits or masses. +BS  Extremities: No cyanosis, clubbing, or rash. R and LLE no edema.  Neuro: Alert & orientedx3, cranial nerves grossly intact. moves all 4 extremities w/o difficulty. Affect pleasant   Assessment/Plan: 1. Chronic systolic CHF: Ischemic cardiomyopathy s/p St Jude CRT-D, EF 20%(04/2016 )  - Echo 11/18 EF stable 25-30% - CPX 11/18 Peak VO2: 18.5 (76% predicted peak VO2) VE/VCO2 slope: 37  - ICD  interrogated. 95% BiV pacing.  - Volume status stable on exam. Stable NYHA class II-III.  - Continue lasix 80 mg am, 40 mg pm. Did not tolerate 80 mg BID. Taking metolazone once/week.  - Continue Entresto 49/51 mg BID - Continue spironolactone 25, digoxin and Coreg 3.125 mg BID.  BP too soft to titrate. Check BMET and dig level today. - Encouraged him to wear TED hose 2. CAD:  -  No s/s ischemia. - Patent grafts on LHC in 4/15. Continue statin.  With Eliquis so no longer taking aspirin. Can drop dose to 2.5 bid when he turns 79    3. Paroxysmal atrial fibrillation:   - ICD interrogated personally. 2.8% afib since October.  - Continue eliquis 5 mg twice a day. No bleeding. 4. H/o Recurrent syncope  - Suspect cough syncope. Quiescent. No change.  5. ICD  - Follows with Dr. Caryl Comes. 6. CKD, III-IV - Creatinine 1.52 08/2018. Check BMET today 7. Dizziness on turning head to right - No carotid bruits or hypersensitvity on exam.  - R ICA ok. L 1-39% on 1/19 8. DM2 - followed by PCP - consider Jardiance. No change.   Georgiana Shore NP 12/02/2018  Patient seen and examined with the above-signed Advanced Practice Provider and/or Housestaff. I personally reviewed laboratory data, imaging studies and relevant notes. I independently examined the patient and formulated the important aspects of the plan. I have edited the note to reflect any of my changes or salient points. I have personally discussed the plan with the patient and/or family.  Volume status looks good today. Using metolazone about 1x/week. Having intermittent bouts of AF.  ICD interrogated personally today. About 2.8% AF. Volume up and down but better today. No VT.   Reinforced need for daily weights and continue use of sliding scale diuretics. Wear TED hose. Will not increase HF meds due to dizziness and previous intolerance of higher doses.  Does not qualify for Cardiomems without recent hospitalization. Check labs today.   Glori Bickers, MD  11:49 AM

## 2018-12-02 NOTE — Patient Instructions (Signed)
Labs done today  Follow up with Dr. Haroldine Laws in 3-4 months.

## 2018-12-21 ENCOUNTER — Other Ambulatory Visit: Payer: Self-pay | Admitting: Internal Medicine

## 2018-12-21 DIAGNOSIS — I480 Paroxysmal atrial fibrillation: Secondary | ICD-10-CM

## 2018-12-30 ENCOUNTER — Ambulatory Visit (INDEPENDENT_AMBULATORY_CARE_PROVIDER_SITE_OTHER): Payer: Medicare Other

## 2018-12-30 ENCOUNTER — Ambulatory Visit: Payer: Medicare Other

## 2018-12-30 DIAGNOSIS — I255 Ischemic cardiomyopathy: Secondary | ICD-10-CM

## 2018-12-30 DIAGNOSIS — Z95810 Presence of automatic (implantable) cardiac defibrillator: Secondary | ICD-10-CM

## 2018-12-30 DIAGNOSIS — I5022 Chronic systolic (congestive) heart failure: Secondary | ICD-10-CM

## 2018-12-31 ENCOUNTER — Encounter: Payer: Self-pay | Admitting: Cardiology

## 2018-12-31 NOTE — Progress Notes (Signed)
Remote ICD transmission.   

## 2018-12-31 NOTE — Progress Notes (Signed)
EPIC Encounter for ICM Monitoring  Patient Name: Mason Arnold is a 78 y.o. male Date: 12/31/2018 Primary Care Physican: Binnie Rail, MD Primary Cardiologist:[facility] Electrophysiologist: Caryl Comes [facility] Weight:154lbs Bi-V Pacing: 96%  AT/AF Burden: 8.6%since Sep 30, 2018 (Dr Bensimhon's 12/02/18 note says interrogation shows 2.8% Afib)  Heart Failure questions reviewed, pt reported he had weight gain and shortness of breath over the last week but took a Metolazone and feels better.  He reported having chest pain during that time which is a new symptom for him but has now resolved.    Thoracic impedance normal.   Prescribed:Furosemide80 mg in the morning and 40 mg in the evening.  He confirmed he is taking all medications and not missing any dosages.   Labs: 09/01/2018 Creatinine 1.52, BUN 28,  Potassium 4.0, Sodium 131, eGFR 42-49 05/07/2018 Creatinine 1.47, BUN 15, Potassium 4.3, Sodium 138, EGFR 44-51 12/11/2017 Creatinine 1.37, BUN 18,  Potassium 3.9, Sodium 134, EGFR 48-56  Recommendations:No changes. Advised if chest pain returns he should call 911 and explained chest pain could be a symptom of heart attack. Encouraged to call for fluid symptoms.  Follow-up plan: ICM clinic phone appointment on2/25/2020.   Office appointment scheduled 03/03/2019 with Dr. Haroldine Laws.    Copy of ICM check sent to Dr. Caryl Comes and Dr Haroldine Laws.   3 month ICM trend: 12/30/2018     AT/AF   1 Year ICM trend:       Rosalene Billings, RN 12/31/2018 5:40 PM

## 2019-01-02 LAB — CUP PACEART REMOTE DEVICE CHECK
Battery Remaining Longevity: 55 mo
Battery Remaining Percentage: 74 %
Battery Voltage: 2.96 V
Brady Statistic AS VS Percent: 1 %
HIGH POWER IMPEDANCE MEASURED VALUE: 41 Ohm
HIGH POWER IMPEDANCE MEASURED VALUE: 41 Ohm
Implantable Lead Implant Date: 20020513
Implantable Lead Implant Date: 20020513
Implantable Lead Implant Date: 20140109
Implantable Lead Location: 753859
Implantable Lead Model: 148
Implantable Lead Serial Number: 117908
Implantable Pulse Generator Implant Date: 20180709
Lead Channel Impedance Value: 430 Ohm
Lead Channel Impedance Value: 580 Ohm
Lead Channel Pacing Threshold Amplitude: 1.25 V
Lead Channel Sensing Intrinsic Amplitude: 11.3 mV
Lead Channel Sensing Intrinsic Amplitude: 3.8 mV
Lead Channel Setting Pacing Amplitude: 2.5 V
Lead Channel Setting Pacing Pulse Width: 0.6 ms
MDC IDC LEAD LOCATION: 753858
MDC IDC LEAD LOCATION: 753860
MDC IDC MSMT LEADCHNL LV PACING THRESHOLD AMPLITUDE: 1.25 V
MDC IDC MSMT LEADCHNL LV PACING THRESHOLD PULSEWIDTH: 0.6 ms
MDC IDC MSMT LEADCHNL RA IMPEDANCE VALUE: 480 Ohm
MDC IDC MSMT LEADCHNL RA PACING THRESHOLD PULSEWIDTH: 0.4 ms
MDC IDC MSMT LEADCHNL RV PACING THRESHOLD AMPLITUDE: 1 V
MDC IDC MSMT LEADCHNL RV PACING THRESHOLD PULSEWIDTH: 0.8 ms
MDC IDC SESS DTM: 20200123210749
MDC IDC SET LEADCHNL LV PACING AMPLITUDE: 2.5 V
MDC IDC SET LEADCHNL RA PACING AMPLITUDE: 2.5 V
MDC IDC SET LEADCHNL RV PACING PULSEWIDTH: 0.8 ms
MDC IDC SET LEADCHNL RV SENSING SENSITIVITY: 0.5 mV
MDC IDC STAT BRADY AP VP PERCENT: 90 %
MDC IDC STAT BRADY AP VS PERCENT: 2.8 %
MDC IDC STAT BRADY AS VP PERCENT: 6.3 %
MDC IDC STAT BRADY RA PERCENT PACED: 85 %
Pulse Gen Serial Number: 7398129

## 2019-01-04 ENCOUNTER — Telehealth (HOSPITAL_COMMUNITY): Payer: Self-pay | Admitting: Licensed Clinical Social Worker

## 2019-01-04 NOTE — Progress Notes (Signed)
Reviewed transmission with Dr Caryl Comes related to increase AT/AF burden.  No changes at this time.

## 2019-01-04 NOTE — Telephone Encounter (Signed)
CSW called pt to inquire if he is in need of Entresto assistance as pt received grant through [employer].  Pt reports that he has assistance through [place] at this time so has no concerns about Entresto at this time.  CSW will continue to follow and assist as needed  Jorge Ny, LCSW Clinical Social Worker [facility] [phone]

## 2019-01-11 ENCOUNTER — Encounter (HOSPITAL_COMMUNITY): Payer: Self-pay

## 2019-01-12 ENCOUNTER — Other Ambulatory Visit (HOSPITAL_COMMUNITY): Payer: Self-pay | Admitting: Internal Medicine

## 2019-01-13 NOTE — Telephone Encounter (Signed)
Refills should be made through PCP

## 2019-01-14 ENCOUNTER — Other Ambulatory Visit (HOSPITAL_COMMUNITY): Payer: Self-pay | Admitting: Internal Medicine

## 2019-01-14 NOTE — Telephone Encounter (Signed)
Refill should be made through PCP

## 2019-01-16 ENCOUNTER — Encounter (HOSPITAL_COMMUNITY): Payer: Self-pay

## 2019-01-17 ENCOUNTER — Other Ambulatory Visit (HOSPITAL_COMMUNITY): Payer: Self-pay

## 2019-01-17 ENCOUNTER — Telehealth: Payer: Self-pay | Admitting: *Deleted

## 2019-01-17 MED ORDER — EMPAGLIFLOZIN 10 MG PO TABS: 10.0000 mg | ORAL_TABLET | Freq: Every day | ORAL | 3 refills | Status: DC

## 2019-01-17 NOTE — Telephone Encounter (Signed)
Dr. Caryl Comes reviewed inappropriate mode switches contributing to increased "AT/AF" burden--EGMs show FFRWs. Plan to bring patient in to DC for reprogramming.  Patient aware and agreeable to an appointment on Wednesday, 01/19/19 at 11:00am. He denies questions or concerns at this time.

## 2019-01-19 ENCOUNTER — Ambulatory Visit (INDEPENDENT_AMBULATORY_CARE_PROVIDER_SITE_OTHER): Payer: Medicare Other

## 2019-01-19 ENCOUNTER — Other Ambulatory Visit (HOSPITAL_COMMUNITY): Payer: Self-pay | Admitting: Internal Medicine

## 2019-01-19 ENCOUNTER — Encounter (HOSPITAL_COMMUNITY): Payer: Self-pay

## 2019-01-19 ENCOUNTER — Other Ambulatory Visit: Payer: Self-pay | Admitting: Internal Medicine

## 2019-01-19 DIAGNOSIS — R55 Syncope and collapse: Secondary | ICD-10-CM

## 2019-01-19 DIAGNOSIS — I480 Paroxysmal atrial fibrillation: Secondary | ICD-10-CM

## 2019-01-21 ENCOUNTER — Other Ambulatory Visit: Payer: Self-pay | Admitting: Internal Medicine

## 2019-01-21 DIAGNOSIS — I480 Paroxysmal atrial fibrillation: Secondary | ICD-10-CM

## 2019-01-21 NOTE — Telephone Encounter (Signed)
Copied from [place] [id number] [id number]. Topic: Quick Communication - Rx Refill/Question >> Jan 21, 2019 10:34 AM Antonieta Iba C wrote: Medication: FLUoxetine (PROZAC) 20 MG capsule   Has the patient contacted their pharmacy? Yes  (Agent: If no, request that the patient contact the pharmacy for the refill.) (Agent: If yes, when and what did the pharmacy advise?)  Preferred Pharmacy (with phone number or street name): [facility] [address] [place] [phone] (Phone) [phone] (Fax)    Agent: Please be advised that RX refills may take up to 3 business days. We ask that you follow-up with your pharmacy.

## 2019-01-23 ENCOUNTER — Encounter (HOSPITAL_COMMUNITY): Payer: Self-pay

## 2019-01-24 ENCOUNTER — Other Ambulatory Visit: Payer: Self-pay

## 2019-01-24 DIAGNOSIS — I480 Paroxysmal atrial fibrillation: Secondary | ICD-10-CM

## 2019-01-24 MED ORDER — FLUOXETINE HCL 20 MG PO CAPS: 20.0000 mg | ORAL_CAPSULE | Freq: Every day | ORAL | 0 refills | Status: DC

## 2019-01-26 ENCOUNTER — Ambulatory Visit (HOSPITAL_COMMUNITY)
Admission: RE | Admit: 2019-01-26 | Discharge: 2019-01-26 | Disposition: A | Discharge status: Home / Self Care | Payer: Medicare Other | Source: Ambulatory Visit | Attending: Internal Medicine | Admitting: Internal Medicine

## 2019-01-26 DIAGNOSIS — I5022 Chronic systolic (congestive) heart failure: Secondary | ICD-10-CM

## 2019-01-26 LAB — CBC
HCT: 46.5 % (ref 39.0–52.0)
Hemoglobin: 15.3 g/dL (ref 13.0–17.0)
MCH: 31.2 pg (ref 26.0–34.0)
MCHC: 32.9 g/dL (ref 30.0–36.0)
MCV: 94.9 fL (ref 80.0–100.0)
NRBC: 0 % (ref 0.0–0.2)
Platelets: 279 10*3/uL (ref 150–400)
RBC: 4.9 MIL/uL (ref 4.22–5.81)
RDW: 14.5 % (ref 11.5–15.5)
WBC: 12.8 10*3/uL — AB (ref 4.0–10.5)

## 2019-01-26 LAB — BASIC METABOLIC PANEL
ANION GAP: 14 (ref 5–15)
BUN: 27 mg/dL — ABNORMAL HIGH (ref 8–23)
CO2: 25 mmol/L (ref 22–32)
Calcium: 8.9 mg/dL (ref 8.9–10.3)
Chloride: 94 mmol/L — ABNORMAL LOW (ref 98–111)
Creatinine, Ser: 1.35 mg/dL — ABNORMAL HIGH (ref 0.61–1.24)
GFR calc Af Amer: 57 mL/min — ABNORMAL LOW (ref >60)
GFR calc non Af Amer: 50 mL/min — ABNORMAL LOW (ref >60)
Glucose, Bld: 159 mg/dL — ABNORMAL HIGH (ref 70–99)
Potassium: 4.8 mmol/L (ref 3.5–5.1)
Sodium: 133 mmol/L — ABNORMAL LOW (ref 135–145)

## 2019-01-27 ENCOUNTER — Telehealth (HOSPITAL_COMMUNITY): Payer: Self-pay

## 2019-01-27 DIAGNOSIS — I5022 Chronic systolic (congestive) heart failure: Secondary | ICD-10-CM

## 2019-01-27 NOTE — Telephone Encounter (Signed)
Error

## 2019-02-01 ENCOUNTER — Ambulatory Visit (INDEPENDENT_AMBULATORY_CARE_PROVIDER_SITE_OTHER): Payer: Medicare Other

## 2019-02-01 ENCOUNTER — Encounter (HOSPITAL_COMMUNITY): Payer: Self-pay

## 2019-02-01 DIAGNOSIS — Z95810 Presence of automatic (implantable) cardiac defibrillator: Secondary | ICD-10-CM | POA: Diagnosis not present

## 2019-02-01 DIAGNOSIS — I5022 Chronic systolic (congestive) heart failure: Secondary | ICD-10-CM | POA: Diagnosis not present

## 2019-02-01 NOTE — Progress Notes (Signed)
Device check for inappropriate mode switching, PVAB changed from 170mg  to 164ms.

## 2019-02-02 ENCOUNTER — Ambulatory Visit (HOSPITAL_BASED_OUTPATIENT_CLINIC_OR_DEPARTMENT_OTHER)
Admission: RE | Admit: 2019-02-02 | Discharge: 2019-02-02 | Disposition: A | Discharge status: Home / Self Care | Payer: Medicare Other | Source: Ambulatory Visit | Attending: Internal Medicine | Admitting: Internal Medicine

## 2019-02-02 ENCOUNTER — Encounter (HOSPITAL_COMMUNITY): Payer: Self-pay

## 2019-02-02 ENCOUNTER — Other Ambulatory Visit (HOSPITAL_COMMUNITY): Payer: Self-pay

## 2019-02-02 VITALS — BP 102/66 | HR 73 | Wt 159.4 lb

## 2019-02-02 DIAGNOSIS — I2510 Atherosclerotic heart disease of native coronary artery without angina pectoris: Secondary | ICD-10-CM | POA: Diagnosis not present

## 2019-02-02 DIAGNOSIS — M109 Gout, unspecified: Secondary | ICD-10-CM | POA: Insufficient documentation

## 2019-02-02 DIAGNOSIS — E1122 Type 2 diabetes mellitus with diabetic chronic kidney disease: Secondary | ICD-10-CM

## 2019-02-02 DIAGNOSIS — J9601 Acute respiratory failure with hypoxia: Secondary | ICD-10-CM | POA: Diagnosis not present

## 2019-02-02 DIAGNOSIS — Z950 Presence of cardiac pacemaker: Secondary | ICD-10-CM | POA: Diagnosis not present

## 2019-02-02 DIAGNOSIS — I252 Old myocardial infarction: Secondary | ICD-10-CM | POA: Diagnosis not present

## 2019-02-02 DIAGNOSIS — I480 Paroxysmal atrial fibrillation: Secondary | ICD-10-CM | POA: Insufficient documentation

## 2019-02-02 DIAGNOSIS — Z888 Allergy status to other drugs, medicaments and biological substances status: Secondary | ICD-10-CM | POA: Diagnosis not present

## 2019-02-02 DIAGNOSIS — Z794 Long term (current) use of insulin: Secondary | ICD-10-CM | POA: Insufficient documentation

## 2019-02-02 DIAGNOSIS — E876 Hypokalemia: Secondary | ICD-10-CM | POA: Diagnosis not present

## 2019-02-02 DIAGNOSIS — I272 Pulmonary hypertension, unspecified: Secondary | ICD-10-CM | POA: Diagnosis not present

## 2019-02-02 DIAGNOSIS — Z8249 Family history of ischemic heart disease and other diseases of the circulatory system: Secondary | ICD-10-CM

## 2019-02-02 DIAGNOSIS — Z951 Presence of aortocoronary bypass graft: Secondary | ICD-10-CM

## 2019-02-02 DIAGNOSIS — I5023 Acute on chronic systolic (congestive) heart failure: Secondary | ICD-10-CM | POA: Diagnosis not present

## 2019-02-02 DIAGNOSIS — Z95810 Presence of automatic (implantable) cardiac defibrillator: Secondary | ICD-10-CM | POA: Insufficient documentation

## 2019-02-02 DIAGNOSIS — R42 Dizziness and giddiness: Secondary | ICD-10-CM | POA: Insufficient documentation

## 2019-02-02 DIAGNOSIS — E785 Hyperlipidemia, unspecified: Secondary | ICD-10-CM

## 2019-02-02 DIAGNOSIS — N184 Chronic kidney disease, stage 4 (severe): Secondary | ICD-10-CM

## 2019-02-02 DIAGNOSIS — Z7901 Long term (current) use of anticoagulants: Secondary | ICD-10-CM

## 2019-02-02 DIAGNOSIS — I255 Ischemic cardiomyopathy: Secondary | ICD-10-CM | POA: Insufficient documentation

## 2019-02-02 DIAGNOSIS — R55 Syncope and collapse: Secondary | ICD-10-CM | POA: Insufficient documentation

## 2019-02-02 DIAGNOSIS — I447 Left bundle-branch block, unspecified: Secondary | ICD-10-CM | POA: Diagnosis not present

## 2019-02-02 DIAGNOSIS — I5022 Chronic systolic (congestive) heart failure: Secondary | ICD-10-CM

## 2019-02-02 DIAGNOSIS — Z79899 Other long term (current) drug therapy: Secondary | ICD-10-CM

## 2019-02-02 DIAGNOSIS — Z91018 Allergy to other foods: Secondary | ICD-10-CM | POA: Diagnosis not present

## 2019-02-02 DIAGNOSIS — I130 Hypertensive heart and chronic kidney disease with heart failure and stage 1 through stage 4 chronic kidney disease, or unspecified chronic kidney disease: Secondary | ICD-10-CM

## 2019-02-02 DIAGNOSIS — N183 Chronic kidney disease, stage 3 (moderate): Secondary | ICD-10-CM | POA: Diagnosis not present

## 2019-02-02 DIAGNOSIS — Z87891 Personal history of nicotine dependence: Secondary | ICD-10-CM | POA: Diagnosis not present

## 2019-02-02 LAB — CBC
HCT: 47.7 % (ref 39.0–52.0)
HEMOGLOBIN: 15.1 g/dL (ref 13.0–17.0)
MCH: 30.7 pg (ref 26.0–34.0)
MCHC: 31.7 g/dL (ref 30.0–36.0)
MCV: 97 fL (ref 80.0–100.0)
Platelets: 282 10*3/uL (ref 150–400)
RBC: 4.92 MIL/uL (ref 4.22–5.81)
RDW: 14.5 % (ref 11.5–15.5)
WBC: 12.2 10*3/uL — ABNORMAL HIGH (ref 4.0–10.5)
nRBC: 0 % (ref 0.0–0.2)

## 2019-02-02 LAB — PROTIME-INR
INR: 1.5 — ABNORMAL HIGH (ref 0.8–1.2)
Prothrombin Time: 17.9 seconds — ABNORMAL HIGH (ref 11.4–15.2)

## 2019-02-02 LAB — BRAIN NATRIURETIC PEPTIDE: B Natriuretic Peptide: 3871.3 pg/mL — ABNORMAL HIGH (ref 0.0–100.0)

## 2019-02-02 NOTE — Patient Instructions (Addendum)
Be sure to take and additional 40 mg of Lasix tonight  Your physician has requested that you have an echocardiogram. Echocardiography is a painless test that uses sound waves to create images of your heart. It provides your doctor with information about the size and shape of your heart and how well your heart's chambers and valves are working. This procedure takes approximately one hour. There are no restrictions for this procedure.  Your physician recommends that you schedule a follow-up appointment in: 4-6 weeks with Dr Haroldine Laws and echo  Your physician has requested that you have an echocardiogram. Echocardiography is a painless test that uses sound waves to create images of your heart. It provides your doctor with information about the size and shape of your heart and how well your heart's chambers and valves are working. This procedure takes approximately one hour. There are no restrictions for this procedure.        Mason Arnold  02/02/2019  You are scheduled for a Cardiac Catheterization on Thursday, February 27 with Dr. Glori Bickers.  1. Please arrive at the [facility] (Main Entrance A) at [facility]: [address] [place], [place] [address] at 7:30 AM (This time is two hours before your procedure to ensure your preparation). Free valet parking service is available.   Special note: Every effort is made to have your procedure done on time. Please understand that emergencies sometimes delay scheduled procedures.  2. Diet: Do not eat solid foods after midnight.  The patient may have clear liquids until 5am upon the day of the procedure.  3. Labs: Pre procedure labs done 02/02/2019  4. Medication instructions in preparation for your procedure:   Contrast Allergy: No   Stop taking Eliquis (Apixiban) on Tuesday, February 26.  Stop taking Lasix on Thursday, February 27  Take only 17 units of insulin the night before your procedure. Do not take any insulin on the  day of the procedure.    On the morning of your procedure, take your Aspirin and any morning medicines NOT listed above.  You may use sips of water.  5. Plan for one night stay--bring personal belongings. 6. Bring a current list of your medications and current insurance cards. 7. You MUST have a responsible person to drive you home. 8. Someone MUST be with you the first 24 hours after you arrive home or your discharge will be delayed. 9. Please wear clothes that are easy to get on and off and wear slip-on shoes.  Thank you for allowing [place] to care for you!   -- [facility]

## 2019-02-02 NOTE — Progress Notes (Signed)
Patient ID: Mason Arnold, male   DOB: 1941-02-12, 78 y.o.   MRN: [id number]   ADVANCED HF CLINIC NOTE PCP: [facility] Primary cardiologist: Dr. Stanford Breed EP: Dr Caryl Comes  Xaden Kaufman is a 78 y.o. male with history of CAD s/p CABG 2000, ischemic cardiomyopathy (20%) with St Jude CRT-D, DM2, paroxysmal atrial fibrillation, and recurrent syncope of uncertain etiology.    He had RHC/LHC in 4/15 showing patent grafts but elevated filling pressures and low cardiac index (1.9 Fick/2.2 thermo).   Today he returns for HF follow up. Overall feeling terrible. Complaining of dyspnea with exertion and occasional chest discomfort. SOB with exertion. + Orthopnea. Coughing at night. His wife says he has been declining over the last few weeks. Denies PND. Had syncopal episode last week. His wife tried to take him to the ED however he refused.   Appetite bad. No fever or chills. Weight at home 156 pounds. Taking all medications  ICD interrogated personally: Volume overloaded for the last 14 days on Corvue. Impedance down.   CPX 11/18 FVC 2.77 (83%)    FEV1 2.14 (85%)     FEV1/FVC 77 (101%)   MVV 82 (81%) Resting HR: 64 Peak HR: 128  (90% age predicted max HR) BP rest: 100/64 BP peak: 126/60  Peak VO2: 18.5 (76% predicted peak VO2) VE/VCO2 slope: 37  OUES: 1.43 Peak RER: 1.10 Ventilatory Threshold: 14.9 (67% predicted or measured peak VO2) VE/MVV: 74% O2pulse: 12  (100% predicted O2pulse)  Echo 11/18: EF 25-30% ECHO 04/16/2016: EF 20% IVC normal. RV mildly reduced Echo 8/15: EF 15-20% RV moderate dysfunction.   [place] 04/2016 RA = 7 RV = 48/2/8 PA = 54/20 (33) PCW = 19 Fick cardiac output/index = 4.2/2.4 PVR = 3.3 WU Ao sat = 93% PA sat = 57%, 60%  RHC/LHC 2015  RA = 9 RV = 69/0/13 PA = 61/24 (38) PCW = 29 Fick cardiac output/index = 3.4/1.9 Thermo CO/CI = 4.1 /2.2 PVR = 2.7 Woods FA sat = 94% PA sat = 57%, 57% Ao Pressure: 104/58 (77) LV Pressure:  103/13/29 Left main: Mild plaque LAD: Totally occluded ostially LCX: Small ramus with high-grade ostial disease. AV groove LCX appears occluded in mid section. OM-1 occluded proximally. RCA: Unable to be cannulated. Suspect occluded ostially.  LV-gram done in the RAO projection: Ejection fraction = 15%. Basal inferior and anterior walls only sections that contract.  LIMA to LAD: Patent SVG to Ramus: Patent SVG to OM-3: Patent SVG to R PDA: Patent. The RCA is occluded prior to take-off of PDA. There are collaterals from distal RCA to OM-1 and probably a septal perforator.  PMH: 1. HTN 2. Hyperlipidemia 3. CAD: s/p CABG with LIMA-LAD, SVG-ramus, SVG-OM3, SVG-PDA.  LHC (4/15) with patent grafts.  4. Ischemic cardiomyopathy: Echo (8/14) with EF 20-25% with regional WMAs, mild MR, mildly decreased RV systolic function.  EF LV-gram (4/15) was 15%. He has a Research officer, political party CRT-D device (upgraded 1/14).  RHC (4/15): mean RA 9, PA 61/24 (mean 38), mean PCWP 29, CI 1.9 Fick/2.2 thermo, PVR 2.7. CPX (04/2014): Peak VO2: 19.6 (76% predicted peak VO2), VE/VCO2: 34.8, Peak RER 1.23.  Echo (10/15) with EF 20-25% with regional wall motion abnormalities, normal RV size with mild to moderately decreased systolic function, mild AS, mild MR.  5. Syncopal episodes: Relatively frequent, etiology uncertain.  He does get lightheaded/near-syncopal with coughing spells, but he has syncope at other times also with no warning.  Since device has been in place,  he has had VT detected but it has not been clearly correlated with syncopal episodes. Suspect vasomotor.  6. Atrial fibrillation: Paroxysmal.   7. Gout 8. H/o LBBB 9. Type II diabetes 10. CKD  SH: Married, lives in [place], nonsmoker, rare ETOH.   FH: CAD  Review of systems complete and found to be negative unless listed in HPI.   Current Outpatient Medications  Medication Sig Dispense Refill  . acetaminophen (TYLENOL) 500 MG tablet Take 500 mg by mouth every 6  (six) hours as needed. For pain    . albuterol (PROVENTIL HFA;VENTOLIN HFA) 108 (90 Base) MCG/ACT inhaler Inhale 2 puffs into the lungs every 4 (four) hours as needed for wheezing. 1 Inhaler 2  . allopurinol (ZYLOPRIM) 100 MG tablet Take 1 tablet (100 mg total) by mouth daily at 8 pm. (1900) 30 tablet 2  . calcium carbonate (OS-CAL) 600 MG TABS Take 600 mg by mouth daily after breakfast.     . Carboxymethylcellulose Sodium (THERATEARS) 0.25 % SOLN Place 1 drop into both eyes 3 (three) times daily as needed (for dry eyes.).    Marland Kitchen carvedilol (COREG) 3.125 MG tablet TAKE ONE TABLET BY MOUTH TWICE DAILY WITH MEALS 60 tablet 9  . colchicine 0.6 MG tablet Take 0.6 mg by mouth daily as needed (gout flare up).     . digoxin (LANOXIN) 0.125 MG tablet TAKE ONE TABLET BY MOUTH ONE TIME DAILY  90 tablet 1  . ELIQUIS 5 MG TABS tablet TAKE 1 TABLET BY MOUTH TWICE A DAY 60 tablet 3  . famotidine (PEPCID) 10 MG tablet Take 10 mg by mouth daily before supper. 2 HRS BEFORE SUPPER.    Marland Kitchen FLUoxetine (PROZAC) 20 MG capsule Take 1 capsule (20 mg total) by mouth daily. -- Office visit needed for further refills 30 capsule 0  . furosemide (LASIX) 80 MG tablet Take 80 mg by mouth daily.    . insulin glargine (LANTUS) 100 UNIT/ML injection Inject 34 Units into the skin daily at 10 pm. (2100)    . loratadine (CLARITIN) 10 MG tablet Take 10 mg by mouth daily as needed. For allergies    . metolazone (ZAROXOLYN) 5 MG tablet Take 1 tablet (5 mg total) by mouth once a week. 25 tablet 1  . Multiple Vitamin (MULTIVITAMIN) tablet Take 1 tablet by mouth daily after breakfast.     . omega-3 acid ethyl esters (LOVAZA) 1 G capsule Take 2 g by mouth 2 (two) times daily. (0700 & 1900)    . potassium chloride SA (K-DUR,KLOR-CON) 20 MEQ tablet TAKE ONE TABLET BY MOUTH TWICE DAILY AT 7AM AND 7PM 60 tablet 3  . sacubitril-valsartan (ENTRESTO) 49-51 MG Take 1 tablet by mouth 2 (two) times daily. 60 tablet 6  . spironolactone (ALDACTONE) 25 MG  tablet Take 25 mg by mouth daily. (1900)     No current facility-administered medications for this encounter.     BP 102/66   Pulse 73   Wt 72.3 kg (159 lb 6 oz)   SpO2 94%   BMI 26.52 kg/m  Wt Readings from Last 3 Encounters:  02/02/19 72.3 kg (159 lb 6 oz)  12/02/18 72.4 kg (159 lb 9.6 oz)  09/30/18 72.5 kg (159 lb 12.8 oz)   General:  Appears  fatigued. No resp difficulty HEENT: normal Neck: supple. JVP to jaw. Carotids 2+ bilat; no bruits. No lymphadenopathy or thryomegaly appreciated. Cor: PMI nondisplaced. Regular rate & rhythm. No rubs, gallops or murmurs. Lungs: clear Abdomen: soft, nontender,  nondistended. No hepatosplenomegaly. No bruits or masses. Good bowel sounds. Extremities: no cyanosis, clubbing, rash, R and LLE 1+ edema Neuro: alert & orientedx3, cranial nerves grossly intact. moves all 4 extremities w/o difficulty. Affect pleasant  Assessment/Plan:  1. Chronic systolic CHF: Ischemic cardiomyopathy s/p St Jude CRT-D, EF 20%(04/2016)  - Echo 11/18 EF stable 25-30% - CPX 11/18 Peak VO2: 18.5 (76% predicted peak VO2) VE/VCO2 slope: 37  NYHA III-IV. Volume status elevated.  - Volume status stable on exam. Stable NYHA class II-III.  - Continue lasix 80 mg am and he was instructed to take another 80 mg of lasix tonight.  -Taking metolazone once/week.  - Continue Entresto 49/51 mg BID - Continue spironolactone 25, digoxin and Coreg 3.125 mg BID.   - Set up RHC/LHC for tomorrow.  2. CAD:  - Patent grafts on LHC in 4/15. Continue statin.  With Eliquis so no longer taking aspirin.  -Can drop dose to 2.5 bid when he turns 79    - Set up LHC for tomorrow.  3. Paroxysmal atrial fibrillation:   - Continue eliquis 5 mg twice a day. No bleeding. 4. H/o Recurrent syncope  - Had syncope last week.  5. ICD  - Follows with Dr. Caryl Comes. 6. CKD, III-IV - Check BMET   7. Dizziness on turning head to right - No carotid bruits or hypersensitvity on exam.  - R ICA ok. L 1-39%  on 1/19 8. DM2 - followed by PCP - consider Jardiance. No change.   Today he presents with a functional decline and has volume overload. We will set up RHC/LHC to reassess coronaries and hemodynamics. He may have low output +/- blockages and require hospitalization.   If admitted we will plan to repeat ECHO at that time. I would also want to obtain SPEP/UPEP given syncope.  He is aware he may require hospitalization and agrees with plan.  Check labs today.   Greater than 50% of the (total minutes 40) visit spent in counseling/coordination of care regarding the above.    Kamylah Manzo NP 02/02/2019

## 2019-02-02 NOTE — Progress Notes (Signed)
EPIC Encounter for ICM Monitoring  Patient Name: Mason Arnold is a 78 y.o. male Date: 02/02/2019 Primary Care Physican: Binnie Rail, MD Primary Cardiologist:[facility] Electrophysiologist: Caryl Comes [facility] LastWeight:154lbs Today's Weight:  156.2 lbs Bi-V Pacing: 97%  AT/AF Burden: 9/2%  Heart Failure questions reviewed, pt reported shortness of breath and taking Metolazone weekly.  HF clinic advised him today to take extra Furosemide tonight.      Thoracic impedance has been abnormal for past 14 days.   Prescribed:Furosemide80 mg take 1 tablet daily.   Labs: 01/26/2019 Creatinine 1.35, BUN 27, Potassium 4.8, Sodium 133, GFR 50-57 12/02/2018 Creatinine 1.77, BUN 30, Potassium 4.9, Sodium 135, GFR 36-42  A complete set of results can be found in Results Review.  Recommendations:Advised will recheck fluid levels 3/9 and encouraged to call for fluid symptoms.  Follow-up plan: ICM clinic phone appointment on3/08/2019 to recheck fluid levels.  Office appointment scheduled 03/14/2019 with Dr. Haroldine Laws.  Heart cath scheduled for 02/03/2019.  Copy of ICM check sent to Dr. Caryl Comes.   AT/AF   3 month ICM trend: 02/02/2019    1 Year ICM trend:       Rosalene Billings, RN 02/02/2019 5:06 PM

## 2019-02-02 NOTE — Progress Notes (Signed)
Orders placed for r/l heart cath

## 2019-02-02 NOTE — H&P (View-Only) (Signed)
Patient ID: Mason Arnold, male   DOB: [birth year], 78 y.o.   MRN: [id number]   ADVANCED HF CLINIC NOTE [facility]CP: [facility] Primary cardiologist: Dr. Stanford Breed EP: Dr Caryl Comes  Mason Arnold is a 78 y.o. male with history of CAD s/p CABG 2000, ischemic cardiomyopathy (20%) with St Jude CRT-D, DM2, paroxysmal atrial fibrillation, and recurrent syncope of uncertain etiology.    He had RHC/LHC in 4/15 showing patent grafts but elevated filling pressures and low cardiac index (1.9 Fick/2.2 thermo).   Today he returns for HF follow up. Overall feeling terrible. Complaining of dyspnea with exertion and occasional chest discomfort. SOB with exertion. + Orthopnea. Coughing at night. His wife says he has been declining over the last few weeks. Denies PND. Had syncopal episode last week. His wife tried to take him to the ED however he refused.   Appetite bad. No fever or chills. Weight at home 156 pounds. Taking all medications  ICD interrogated personally: Volume overloaded for the last 14 days on Corvue. Impedance down.   CPX 11/18 FVC 2.77 (83%)    FEV1 2.14 (85%)     FEV1/FVC 77 (101%)   MVV 82 (81%) Resting HR: 64 Peak HR: 128  (90% age predicted max HR) BP rest: 100/64 BP peak: 126/60  Peak VO2: 18.5 (76% predicted peak VO2) VE/VCO2 slope: 37  OUES: 1.43 Peak RER: 1.10 Ventilatory Threshold: 14.9 (67% predicted or measured peak VO2) VE/MVV: 74% O2pulse: 12  (100% predicted O2pulse)  Echo 11/18: EF 25-30% ECHO 04/16/2016: EF 20% IVC normal. RV mildly reduced Echo 8/15: EF 15-20% RV moderate dysfunction.   [place] 04/2016 RA = 7 RV = 48/2/8 PA = 54/20 (33) PCW = 19 Fick cardiac output/index = 4.2/2.4 PVR = 3.3 WU Ao sat = 93% PA sat = 57%, 60%  RHC/LHC 2015  RA = 9 RV = 69/0/13 PA = 61/24 (38) PCW = 29 Fick cardiac output/index = 3.4/1.9 Thermo CO/CI = 4.1 /2.2 PVR = 2.7 Woods FA sat = 94% PA sat = 57%, 57% Ao Pressure: 104/58 (77) LV Pressure:  103/13/29 Left main: Mild plaque LAD: Totally occluded ostially LCX: Small ramus with high-grade ostial disease. AV groove LCX appears occluded in mid section. OM-1 occluded proximally. RCA: Unable to be cannulated. Suspect occluded ostially.  LV-gram done in the RAO projection: Ejection fraction = 15%. Basal inferior and anterior walls only sections that contract.  LIMA to LAD: Patent SVG to Ramus: Patent SVG to OM-3: Patent SVG to R PDA: Patent. The RCA is occluded prior to take-off of PDA. There are collaterals from distal RCA to OM-1 and probably a septal perforator.  PMH: 1. HTN 2. Hyperlipidemia 3. CAD: s/p CABG with LIMA-LAD, SVG-ramus, SVG-OM3, SVG-PDA.  LHC (4/15) with patent grafts.  4. Ischemic cardiomyopathy: Echo (8/14) with EF 20-25% with regional WMAs, mild MR, mildly decreased RV systolic function.  EF LV-gram (4/15) was 15%. He has a Research officer, political party CRT-D device (upgraded 1/14).  RHC (4/15): mean RA 9, PA 61/24 (mean 38), mean PCWP 29, CI 1.9 Fick/2.2 thermo, PVR 2.7. CPX (04/2014): Peak VO2: 19.6 (76% predicted peak VO2), VE/VCO2: 34.8, Peak RER 1.23.  Echo (10/15) with EF 20-25% with regional wall motion abnormalities, normal RV size with mild to moderately decreased systolic function, mild AS, mild MR.  5. Syncopal episodes: Relatively frequent, etiology uncertain.  He does get lightheaded/near-syncopal with coughing spells, but he has syncope at other times also with no warning.  Since device has been in place,  he has had VT detected but it has not been clearly correlated with syncopal episodes. Suspect vasomotor.  6. Atrial fibrillation: Paroxysmal.   7. Gout 8. H/o LBBB 9. Type II diabetes 10. CKD  SH: Married, lives in [place], nonsmoker, rare ETOH.   FH: CAD  Review of systems complete and found to be negative unless listed in HPI.   Current Outpatient Medications  Medication Sig Dispense Refill  . acetaminophen (TYLENOL) 500 MG tablet Take 500 mg by mouth every 6  (six) hours as needed. For pain    . albuterol (PROVENTIL HFA;VENTOLIN HFA) 108 (90 Base) MCG/ACT inhaler Inhale 2 puffs into the lungs every 4 (four) hours as needed for wheezing. 1 Inhaler 2  . allopurinol (ZYLOPRIM) 100 MG tablet Take 1 tablet (100 mg total) by mouth daily at 8 pm. (1900) 30 tablet 2  . calcium carbonate (OS-CAL) 600 MG TABS Take 600 mg by mouth daily after breakfast.     . Carboxymethylcellulose Sodium (THERATEARS) 0.25 % SOLN Place 1 drop into both eyes 3 (three) times daily as needed (for dry eyes.).    Marland Kitchen carvedilol (COREG) 3.125 MG tablet TAKE ONE TABLET BY MOUTH TWICE DAILY WITH MEALS 60 tablet 9  . colchicine 0.6 MG tablet Take 0.6 mg by mouth daily as needed (gout flare up).     . digoxin (LANOXIN) 0.125 MG tablet TAKE ONE TABLET BY MOUTH ONE TIME DAILY  90 tablet 1  . ELIQUIS 5 MG TABS tablet TAKE 1 TABLET BY MOUTH TWICE A DAY 60 tablet 3  . famotidine (PEPCID) 10 MG tablet Take 10 mg by mouth daily before supper. 2 HRS BEFORE SUPPER.    Marland Kitchen FLUoxetine (PROZAC) 20 MG capsule Take 1 capsule (20 mg total) by mouth daily. -- Office visit needed for further refills 30 capsule 0  . furosemide (LASIX) 80 MG tablet Take 80 mg by mouth daily.    . insulin glargine (LANTUS) 100 UNIT/ML injection Inject 34 Units into the skin daily at 10 pm. (2100)    . loratadine (CLARITIN) 10 MG tablet Take 10 mg by mouth daily as needed. For allergies    . metolazone (ZAROXOLYN) 5 MG tablet Take 1 tablet (5 mg total) by mouth once a week. 25 tablet 1  . Multiple Vitamin (MULTIVITAMIN) tablet Take 1 tablet by mouth daily after breakfast.     . omega-3 acid ethyl esters (LOVAZA) 1 G capsule Take 2 g by mouth 2 (two) times daily. (0700 & 1900)    . potassium chloride SA (K-DUR,KLOR-CON) 20 MEQ tablet TAKE ONE TABLET BY MOUTH TWICE DAILY AT 7AM AND 7PM 60 tablet 3  . sacubitril-valsartan (ENTRESTO) 49-51 MG Take 1 tablet by mouth 2 (two) times daily. 60 tablet 6  . spironolactone (ALDACTONE) 25 MG  tablet Take 25 mg by mouth daily. (1900)     No current facility-administered medications for this encounter.     BP 102/66   Pulse 73   Wt 72.3 kg (159 lb 6 oz)   SpO2 94%   BMI 26.52 kg/m  Wt Readings from Last 3 Encounters:  02/02/19 72.3 kg (159 lb 6 oz)  12/02/18 72.4 kg (159 lb 9.6 oz)  09/30/18 72.5 kg (159 lb 12.8 oz)   General:  Appears  fatigued. No resp difficulty HEENT: normal Neck: supple. JVP to jaw. Carotids 2+ bilat; no bruits. No lymphadenopathy or thryomegaly appreciated. Cor: PMI nondisplaced. Regular rate & rhythm. No rubs, gallops or murmurs. Lungs: clear Abdomen: soft, nontender,  nondistended. No hepatosplenomegaly. No bruits or masses. Good bowel sounds. Extremities: no cyanosis, clubbing, rash, R and LLE 1+ edema Neuro: alert & orientedx3, cranial nerves grossly intact. moves all 4 extremities w/o difficulty. Affect pleasant  Assessment/Plan:  1. Chronic systolic CHF: Ischemic cardiomyopathy s/p St Jude CRT-D, EF 20%(04/2016)  - Echo 11/18 EF stable 25-30% - CPX 11/18 Peak VO2: 18.5 (76% predicted peak VO2) VE/VCO2 slope: 37  NYHA III-IV. Volume status elevated.  - Volume status stable on exam. Stable NYHA class II-III.  - Continue lasix 80 mg am and he was instructed to take another 80 mg of lasix tonight.  -Taking metolazone once/week.  - Continue Entresto 49/51 mg BID - Continue spironolactone 25, digoxin and Coreg 3.125 mg BID.   - Set up RHC/LHC for tomorrow.  2. CAD:  - Patent grafts on LHC in 4/15. Continue statin.  With Eliquis so no longer taking aspirin.  -Can drop dose to 2.5 bid when he turns 79    - Set up LHC for tomorrow.  3. Paroxysmal atrial fibrillation:   - Continue eliquis 5 mg twice a day. No bleeding. 4. H/o Recurrent syncope  - Had syncope last week.  5. ICD  - Follows with Dr. Caryl Comes. 6. CKD, III-IV - Check BMET   7. Dizziness on turning head to right - No carotid bruits or hypersensitvity on exam.  - R ICA ok. L 1-39%  on 1/19 8. DM2 - followed by PCP - consider Jardiance. No change.   Today he presents with a functional decline and has volume overload. We will set up RHC/LHC to reassess coronaries and hemodynamics. He may have low output +/- blockages and require hospitalization.   If admitted we will plan to repeat ECHO at that time. I would also want to obtain SPEP/UPEP given syncope.  He is aware he may require hospitalization and agrees with plan.  Check labs today.   Greater than 50% of the (total minutes 40) visit spent in counseling/coordination of care regarding the above.    Amy Clegg NP 02/02/2019

## 2019-02-02 NOTE — Addendum Note (Signed)
Addended by: Scarlette Calico on: 02/02/2019 03:53 PM   Modules accepted: Orders

## 2019-02-03 ENCOUNTER — Encounter (HOSPITAL_COMMUNITY)
Admission: AD | Disposition: A | Discharge status: Home Health | Payer: Self-pay | Source: Home / Self Care | Attending: Internal Medicine

## 2019-02-03 ENCOUNTER — Other Ambulatory Visit: Payer: Self-pay

## 2019-02-03 ENCOUNTER — Encounter (HOSPITAL_COMMUNITY): Payer: Self-pay | Admitting: Internal Medicine

## 2019-02-03 ENCOUNTER — Inpatient Hospital Stay (HOSPITAL_COMMUNITY)
Admission: AD | Admit: 2019-02-03 | Discharge: 2019-02-06 | DRG: 286 | Disposition: A | Discharge status: Home Health | Payer: Medicare Other | Attending: Internal Medicine | Admitting: Internal Medicine

## 2019-02-03 ENCOUNTER — Telehealth: Payer: Self-pay | Admitting: Internal Medicine

## 2019-02-03 DIAGNOSIS — Z8249 Family history of ischemic heart disease and other diseases of the circulatory system: Secondary | ICD-10-CM | POA: Diagnosis not present

## 2019-02-03 DIAGNOSIS — E785 Hyperlipidemia, unspecified: Secondary | ICD-10-CM | POA: Diagnosis present

## 2019-02-03 DIAGNOSIS — E1122 Type 2 diabetes mellitus with diabetic chronic kidney disease: Secondary | ICD-10-CM | POA: Diagnosis present

## 2019-02-03 DIAGNOSIS — Z7901 Long term (current) use of anticoagulants: Secondary | ICD-10-CM | POA: Diagnosis not present

## 2019-02-03 DIAGNOSIS — I447 Left bundle-branch block, unspecified: Secondary | ICD-10-CM | POA: Diagnosis present

## 2019-02-03 DIAGNOSIS — Z950 Presence of cardiac pacemaker: Secondary | ICD-10-CM | POA: Diagnosis not present

## 2019-02-03 DIAGNOSIS — Z87891 Personal history of nicotine dependence: Secondary | ICD-10-CM

## 2019-02-03 DIAGNOSIS — I5020 Unspecified systolic (congestive) heart failure: Secondary | ICD-10-CM | POA: Diagnosis present

## 2019-02-03 DIAGNOSIS — I2510 Atherosclerotic heart disease of native coronary artery without angina pectoris: Secondary | ICD-10-CM | POA: Diagnosis present

## 2019-02-03 DIAGNOSIS — N183 Chronic kidney disease, stage 3 (moderate): Secondary | ICD-10-CM | POA: Diagnosis present

## 2019-02-03 DIAGNOSIS — Z888 Allergy status to other drugs, medicaments and biological substances status: Secondary | ICD-10-CM

## 2019-02-03 DIAGNOSIS — E876 Hypokalemia: Secondary | ICD-10-CM | POA: Diagnosis present

## 2019-02-03 DIAGNOSIS — Z79899 Other long term (current) drug therapy: Secondary | ICD-10-CM | POA: Diagnosis not present

## 2019-02-03 DIAGNOSIS — J9601 Acute respiratory failure with hypoxia: Secondary | ICD-10-CM | POA: Diagnosis present

## 2019-02-03 DIAGNOSIS — I5022 Chronic systolic (congestive) heart failure: Secondary | ICD-10-CM

## 2019-02-03 DIAGNOSIS — Z951 Presence of aortocoronary bypass graft: Secondary | ICD-10-CM

## 2019-02-03 DIAGNOSIS — I480 Paroxysmal atrial fibrillation: Secondary | ICD-10-CM | POA: Diagnosis present

## 2019-02-03 DIAGNOSIS — I25118 Atherosclerotic heart disease of native coronary artery with other forms of angina pectoris: Secondary | ICD-10-CM | POA: Diagnosis not present

## 2019-02-03 DIAGNOSIS — I130 Hypertensive heart and chronic kidney disease with heart failure and stage 1 through stage 4 chronic kidney disease, or unspecified chronic kidney disease: Principal | ICD-10-CM | POA: Diagnosis present

## 2019-02-03 DIAGNOSIS — Z91018 Allergy to other foods: Secondary | ICD-10-CM

## 2019-02-03 DIAGNOSIS — I351 Nonrheumatic aortic (valve) insufficiency: Secondary | ICD-10-CM | POA: Diagnosis not present

## 2019-02-03 DIAGNOSIS — I252 Old myocardial infarction: Secondary | ICD-10-CM

## 2019-02-03 DIAGNOSIS — I255 Ischemic cardiomyopathy: Secondary | ICD-10-CM | POA: Diagnosis present

## 2019-02-03 DIAGNOSIS — I5023 Acute on chronic systolic (congestive) heart failure: Secondary | ICD-10-CM | POA: Diagnosis present

## 2019-02-03 DIAGNOSIS — I272 Pulmonary hypertension, unspecified: Secondary | ICD-10-CM | POA: Diagnosis present

## 2019-02-03 DIAGNOSIS — M109 Gout, unspecified: Secondary | ICD-10-CM | POA: Diagnosis present

## 2019-02-03 DIAGNOSIS — J9621 Acute and chronic respiratory failure with hypoxia: Secondary | ICD-10-CM | POA: Diagnosis not present

## 2019-02-03 DIAGNOSIS — I509 Heart failure, unspecified: Secondary | ICD-10-CM

## 2019-02-03 DIAGNOSIS — Z794 Long term (current) use of insulin: Secondary | ICD-10-CM

## 2019-02-03 HISTORY — PX: RIGHT/LEFT HEART CATH AND CORONARY/GRAFT ANGIOGRAPHY: CATH118267

## 2019-02-03 HISTORY — DX: Presence of automatic (implantable) cardiac defibrillator: Z95.810

## 2019-02-03 HISTORY — DX: Presence of cardiac pacemaker: Z95.0

## 2019-02-03 HISTORY — DX: Dyspnea, unspecified: R06.00

## 2019-02-03 LAB — POCT I-STAT EG7
ACID-BASE EXCESS: 2 mmol/L (ref 0.0–2.0)
Acid-Base Excess: 4 mmol/L — ABNORMAL HIGH (ref 0.0–2.0)
Bicarbonate: 26.5 mmol/L (ref 20.0–28.0)
Bicarbonate: 28.8 mmol/L — ABNORMAL HIGH (ref 20.0–28.0)
Calcium, Ion: 0.99 mmol/L — ABNORMAL LOW (ref 1.15–1.40)
Calcium, Ion: 1.06 mmol/L — ABNORMAL LOW (ref 1.15–1.40)
HCT: 40 % (ref 39.0–52.0)
HCT: 40 % (ref 39.0–52.0)
Hemoglobin: 13.6 g/dL (ref 13.0–17.0)
Hemoglobin: 13.6 g/dL (ref 13.0–17.0)
O2 Saturation: 67 %
O2 Saturation: 72 %
PH VEN: 7.418 (ref 7.250–7.430)
Potassium: 2.8 mmol/L — ABNORMAL LOW (ref 3.5–5.1)
Potassium: 3 mmol/L — ABNORMAL LOW (ref 3.5–5.1)
Sodium: 135 mmol/L (ref 135–145)
Sodium: 134 mmol/L — ABNORMAL LOW (ref 135–145)
TCO2: 30 mmol/L (ref 22–32)
TCO2: 28 mmol/L (ref 22–32)
pCO2, Ven: 41 mmHg — ABNORMAL LOW (ref 44.0–60.0)
pCO2, Ven: 43.3 mmHg — ABNORMAL LOW (ref 44.0–60.0)
pH, Ven: 7.431 — ABNORMAL HIGH (ref 7.250–7.430)
pO2, Ven: 34 mmHg (ref 32.0–45.0)
pO2, Ven: 37 mmHg (ref 32.0–45.0)

## 2019-02-03 LAB — BASIC METABOLIC PANEL
Anion gap: 16 — ABNORMAL HIGH (ref 5–15)
BUN: 32 mg/dL — ABNORMAL HIGH (ref 8–23)
CO2: 25 mmol/L (ref 22–32)
Calcium: 8.5 mg/dL — ABNORMAL LOW (ref 8.9–10.3)
Chloride: 92 mmol/L — ABNORMAL LOW (ref 98–111)
Creatinine, Ser: 1.32 mg/dL — ABNORMAL HIGH (ref 0.61–1.24)
GFR calc Af Amer: 59 mL/min — ABNORMAL LOW (ref >60)
GFR calc non Af Amer: 51 mL/min — ABNORMAL LOW (ref >60)
Glucose, Bld: 144 mg/dL — ABNORMAL HIGH (ref 70–99)
Potassium: 3.2 mmol/L — ABNORMAL LOW (ref 3.5–5.1)
SODIUM: 133 mmol/L — AB (ref 135–145)

## 2019-02-03 LAB — POCT I-STAT 7, (LYTES, BLD GAS, ICA,H+H)
Acid-Base Excess: 2 mmol/L (ref 0.0–2.0)
Bicarbonate: 25.9 mmol/L (ref 20.0–28.0)
Calcium, Ion: 1.03 mmol/L — ABNORMAL LOW (ref 1.15–1.40)
HCT: 40 % (ref 39.0–52.0)
HEMOGLOBIN: 13.6 g/dL (ref 13.0–17.0)
O2 Saturation: 97 %
Potassium: 2.9 mmol/L — ABNORMAL LOW (ref 3.5–5.1)
Sodium: 134 mmol/L — ABNORMAL LOW (ref 135–145)
TCO2: 27 mmol/L (ref 22–32)
pCO2 arterial: 35.7 mmHg (ref 32.0–48.0)
pH, Arterial: 7.47 — ABNORMAL HIGH (ref 7.350–7.450)
pO2, Arterial: 81 mmHg — ABNORMAL LOW (ref 83.0–108.0)

## 2019-02-03 LAB — GLUCOSE, CAPILLARY
Glucose-Capillary: 144 mg/dL — ABNORMAL HIGH (ref 70–99)
Glucose-Capillary: 182 mg/dL — ABNORMAL HIGH (ref 70–99)
Glucose-Capillary: 129 mg/dL — ABNORMAL HIGH (ref 70–99)

## 2019-02-03 LAB — MAGNESIUM: MAGNESIUM: 2.4 mg/dL (ref 1.7–2.4)

## 2019-02-03 SURGERY — RIGHT/LEFT HEART CATH AND CORONARY/GRAFT ANGIOGRAPHY
Anesthesia: LOCAL

## 2019-02-03 MED ORDER — FUROSEMIDE 10 MG/ML IJ SOLN
120.0000 mg | INTRAVENOUS | Status: DC
  Filled 2019-02-03: qty 12

## 2019-02-03 MED ORDER — COLCHICINE 0.6 MG PO TABS: 0.6000 mg | ORAL_TABLET | Freq: Every day | ORAL | Status: DC | PRN

## 2019-02-03 MED ORDER — ALLOPURINOL 100 MG PO TABS
100.0000 mg | ORAL_TABLET | Freq: Every evening | ORAL | Status: DC
  Administered 2019-02-03 – 2019-02-05 (×3): 100 mg via ORAL
  Filled 2019-02-03 (×3): qty 1

## 2019-02-03 MED ORDER — FUROSEMIDE 10 MG/ML IJ SOLN
120.0000 mg | Freq: Once | INTRAMUSCULAR | Status: AC
  Administered 2019-02-03: 120 mg via INTRAVENOUS

## 2019-02-03 MED ORDER — FUROSEMIDE 10 MG/ML IJ SOLN
INTRAMUSCULAR | Status: AC
  Filled 2019-02-03: qty 16

## 2019-02-03 MED ORDER — FENTANYL CITRATE (PF) 100 MCG/2ML IJ SOLN
INTRAMUSCULAR | Status: AC
  Filled 2019-02-03: qty 2

## 2019-02-03 MED ORDER — SPIRONOLACTONE 25 MG PO TABS
25.0000 mg | ORAL_TABLET | Freq: Every evening | ORAL | Status: DC
  Administered 2019-02-03 – 2019-02-05 (×3): 25 mg via ORAL
  Filled 2019-02-03 (×3): qty 1

## 2019-02-03 MED ORDER — FUROSEMIDE 10 MG/ML IJ SOLN: 80.0000 mg | Freq: Once | INTRAMUSCULAR | Status: DC

## 2019-02-03 MED ORDER — ASPIRIN 81 MG PO CHEW: 81.0000 mg | CHEWABLE_TABLET | ORAL | Status: DC

## 2019-02-03 MED ORDER — ACETAMINOPHEN 325 MG PO TABS
650.0000 mg | ORAL_TABLET | ORAL | Status: DC | PRN
  Administered 2019-02-05 – 2019-02-06 (×2): 650 mg via ORAL
  Filled 2019-02-03 (×2): qty 2

## 2019-02-03 MED ORDER — MIDAZOLAM HCL 2 MG/2ML IJ SOLN
INTRAMUSCULAR | Status: DC | PRN
  Administered 2019-02-03: 1 mg via INTRAVENOUS

## 2019-02-03 MED ORDER — DIGOXIN 125 MCG PO TABS
0.1250 mg | ORAL_TABLET | Freq: Every evening | ORAL | Status: DC
  Administered 2019-02-03: 0.125 mg via ORAL
  Filled 2019-02-03: qty 1

## 2019-02-03 MED ORDER — HEPARIN (PORCINE) IN NACL 1000-0.9 UT/500ML-% IV SOLN
INTRAVENOUS | Status: DC | PRN
  Administered 2019-02-03: 500 mL

## 2019-02-03 MED ORDER — LIDOCAINE HCL (PF) 1 % IJ SOLN
INTRAMUSCULAR | Status: DC | PRN
  Administered 2019-02-03: 17 mL

## 2019-02-03 MED ORDER — SODIUM CHLORIDE 0.9% FLUSH
3.0000 mL | Freq: Two times a day (BID) | INTRAVENOUS | Status: DC
  Administered 2019-02-04 – 2019-02-05 (×3): 3 mL via INTRAVENOUS

## 2019-02-03 MED ORDER — FLUOXETINE HCL 20 MG PO CAPS
20.0000 mg | ORAL_CAPSULE | Freq: Every day | ORAL | Status: DC
  Administered 2019-02-04 – 2019-02-06 (×3): 20 mg via ORAL
  Filled 2019-02-03 (×3): qty 1

## 2019-02-03 MED ORDER — SODIUM CHLORIDE 0.9 % WEIGHT BASED INFUSION: 3.0000 mL/kg/h | INTRAVENOUS | Status: DC

## 2019-02-03 MED ORDER — SACUBITRIL-VALSARTAN 49-51 MG PO TABS
1.0000 | ORAL_TABLET | Freq: Two times a day (BID) | ORAL | Status: DC
  Filled 2019-02-03 (×2): qty 1

## 2019-02-03 MED ORDER — POTASSIUM CHLORIDE CRYS ER 20 MEQ PO TBCR
40.0000 meq | EXTENDED_RELEASE_TABLET | Freq: Once | ORAL | Status: AC
  Administered 2019-02-03: 40 meq via ORAL
  Filled 2019-02-03: qty 2

## 2019-02-03 MED ORDER — LORATADINE 10 MG PO TABS: 10.0000 mg | ORAL_TABLET | Freq: Every day | ORAL | Status: DC | PRN

## 2019-02-03 MED ORDER — ONDANSETRON HCL 4 MG/2ML IJ SOLN: 4.0000 mg | Freq: Four times a day (QID) | INTRAMUSCULAR | Status: DC | PRN

## 2019-02-03 MED ORDER — SODIUM CHLORIDE 0.9 % WEIGHT BASED INFUSION: 1.0000 mL/kg/h | INTRAVENOUS | Status: DC

## 2019-02-03 MED ORDER — SODIUM CHLORIDE 0.9 % IV SOLN
INTRAVENOUS | Status: DC
  Administered 2019-02-03: 09:00:00 via INTRAVENOUS

## 2019-02-03 MED ORDER — MIDAZOLAM HCL 2 MG/2ML IJ SOLN
INTRAMUSCULAR | Status: AC
  Filled 2019-02-03: qty 2

## 2019-02-03 MED ORDER — METOLAZONE 5 MG PO TABS
5.0000 mg | ORAL_TABLET | Freq: Once | ORAL | Status: AC
  Administered 2019-02-03: 5 mg via ORAL
  Filled 2019-02-03: qty 1

## 2019-02-03 MED ORDER — APIXABAN 5 MG PO TABS
5.0000 mg | ORAL_TABLET | Freq: Two times a day (BID) | ORAL | Status: DC
  Administered 2019-02-03 – 2019-02-06 (×6): 5 mg via ORAL
  Filled 2019-02-03 (×6): qty 1

## 2019-02-03 MED ORDER — INSULIN ASPART 100 UNIT/ML ~~LOC~~ SOLN: 0.0000 [IU] | Freq: Every day | SUBCUTANEOUS | Status: DC

## 2019-02-03 MED ORDER — LIDOCAINE HCL (PF) 1 % IJ SOLN
INTRAMUSCULAR | Status: AC
  Filled 2019-02-03: qty 30

## 2019-02-03 MED ORDER — POTASSIUM CHLORIDE CRYS ER 20 MEQ PO TBCR
40.0000 meq | EXTENDED_RELEASE_TABLET | Freq: Two times a day (BID) | ORAL | Status: AC
  Administered 2019-02-03 (×2): 40 meq via ORAL
  Filled 2019-02-03 (×2): qty 2

## 2019-02-03 MED ORDER — SODIUM CHLORIDE 0.9 % IV SOLN: INTRAVENOUS | Status: AC

## 2019-02-03 MED ORDER — FAMOTIDINE 10 MG PO TABS
10.0000 mg | ORAL_TABLET | Freq: Every day | ORAL | Status: DC
  Administered 2019-02-03 – 2019-02-05 (×3): 10 mg via ORAL
  Filled 2019-02-03 (×4): qty 1

## 2019-02-03 MED ORDER — SODIUM CHLORIDE 0.9 % IV SOLN: 250.0000 mL | INTRAVENOUS | Status: DC | PRN

## 2019-02-03 MED ORDER — INSULIN ASPART 100 UNIT/ML ~~LOC~~ SOLN
0.0000 [IU] | Freq: Three times a day (TID) | SUBCUTANEOUS | Status: DC
  Administered 2019-02-03 – 2019-02-04 (×3): 2 [IU] via SUBCUTANEOUS
  Administered 2019-02-04: 1 [IU] via SUBCUTANEOUS
  Administered 2019-02-05: 2 [IU] via SUBCUTANEOUS
  Administered 2019-02-05: 5 [IU] via SUBCUTANEOUS
  Administered 2019-02-06: 2 [IU] via SUBCUTANEOUS

## 2019-02-03 MED ORDER — HEPARIN (PORCINE) IN NACL 1000-0.9 UT/500ML-% IV SOLN
INTRAVENOUS | Status: AC
  Filled 2019-02-03: qty 1000

## 2019-02-03 MED ORDER — IOHEXOL 350 MG/ML SOLN
INTRAVENOUS | Status: DC | PRN
  Administered 2019-02-03: 95 mL via INTRA_ARTERIAL

## 2019-02-03 MED ORDER — SODIUM CHLORIDE 0.9% FLUSH: 3.0000 mL | INTRAVENOUS | Status: DC | PRN

## 2019-02-03 MED ORDER — SODIUM CHLORIDE 0.9% FLUSH: 3.0000 mL | Freq: Two times a day (BID) | INTRAVENOUS | Status: DC

## 2019-02-03 MED ORDER — FENTANYL CITRATE (PF) 100 MCG/2ML IJ SOLN
INTRAMUSCULAR | Status: DC | PRN
  Administered 2019-02-03: 25 ug via INTRAVENOUS

## 2019-02-03 MED ORDER — SACUBITRIL-VALSARTAN 24-26 MG PO TABS: 1.0000 | ORAL_TABLET | Freq: Two times a day (BID) | ORAL | Status: DC

## 2019-02-03 MED ORDER — SODIUM CHLORIDE 0.9% FLUSH
3.0000 mL | Freq: Two times a day (BID) | INTRAVENOUS | Status: DC
  Administered 2019-02-03 – 2019-02-06 (×5): 3 mL via INTRAVENOUS

## 2019-02-03 MED ORDER — POLYVINYL ALCOHOL 1.4 % OP SOLN
1.0000 [drp] | Freq: Three times a day (TID) | OPHTHALMIC | Status: DC | PRN
  Filled 2019-02-03: qty 15

## 2019-02-03 MED ORDER — ALBUTEROL SULFATE (2.5 MG/3ML) 0.083% IN NEBU: 2.5000 mg | INHALATION_SOLUTION | RESPIRATORY_TRACT | Status: DC | PRN

## 2019-02-03 MED ORDER — FUROSEMIDE 10 MG/ML IJ SOLN
80.0000 mg | Freq: Two times a day (BID) | INTRAMUSCULAR | Status: DC
  Administered 2019-02-03 – 2019-02-06 (×6): 80 mg via INTRAVENOUS
  Filled 2019-02-03 (×6): qty 8

## 2019-02-03 SURGICAL SUPPLY — 12 items
CATH INFINITI 5 FR IM (CATHETERS) ×2 IMPLANT
CATH INFINITI 5FR MULTPACK ANG (CATHETERS) ×2 IMPLANT
CATH SWAN GANZ 7F STRAIGHT (CATHETERS) ×2 IMPLANT
KIT HEART LEFT (KITS) ×2 IMPLANT
PACK CARDIAC CATHETERIZATION (CUSTOM PROCEDURE TRAY) ×2 IMPLANT
SHEATH PINNACLE 5F 10CM (SHEATH) ×2 IMPLANT
SHEATH PINNACLE 7F 10CM (SHEATH) ×2 IMPLANT
TRANSDUCER W/STOPCOCK (MISCELLANEOUS) ×2 IMPLANT
TUBING CIL FLEX 10 FLL-RA (TUBING) ×2 IMPLANT
WIRE EMERALD 3MM-J .035X150CM (WIRE) ×2 IMPLANT
WIRE EMERALD 3MM-J .035X260CM (WIRE) ×2 IMPLANT
WIRE HI TORQ VERSACORE-J 145CM (WIRE) ×2 IMPLANT

## 2019-02-03 NOTE — Telephone Encounter (Signed)
Spoke with pt spouse. She wanted to inform Dr. Quay Burow of his hospitalization.   She would like to wait 1-2 months to schedule AWV. Pt will need refill on Prozac in a few weeks.

## 2019-02-03 NOTE — Discharge Instructions (Signed)

## 2019-02-03 NOTE — Progress Notes (Signed)
Site area: RFVx1 and RFAx1 Site Prior to Removal:  Level 0 Pressure Applied For: 20  Minutes  Manual:   yes Patient Status During Pull:  stable Post Pull Site:  Level 0 Post Pull Instructions Given: yes  Post Pull Pulses Present: palpable Dressing Applied:  tegaderm Bedrest begins @ 1200 Comments: pressure held by [employer]

## 2019-02-03 NOTE — H&P (Addendum)
Advanced Heart Failure Team History and Physical Note   PCP:  Binnie Rail, MD  PCP-Cardiology: Glori Bickers, MD     Reason for Admission: Acute on chronic systolic HF   HPI:    Alquan Morrish is a 78 y.o. male with history of CAD s/p CABG 2000, ischemic cardiomyopathy (20%) with St Jude CRT-D, DM2, paroxysmal atrial fibrillation, and recurrent syncope of uncertain etiology.    He had RHC/LHC in 4/15 showing patent grafts but elevated filling pressures and low cardiac index (1.9 Fick/2.2 thermo).   Seen in HF clinic yesterday. He was volume overloaded. He was SOB with exertion, having CP, and orthopneic. He also had a syncopal episode last week. Set up for [facility] today (see below).  He was SOB and hypoxic post cath. Given 120 mg IV lasix + metolazone in the cath lab. Admitted for IV diuresis.   He feels less SOB. Now on 4L. Taking all medications. Drinks a lot of fluids.   R/LHC 02/03/19:  Ost LAD lesion is 100% stenosed.  Prox Cx to Mid Cx lesion is 60% stenosed.  Ost 3rd Mrg lesion is 100% stenosed.  Ost Ramus lesion is 100% stenosed.  Mid RCA lesion is 70% stenosed.  Dist RCA lesion is 100% stenosed.  Origin to Prox Graft lesion is 30% stenosed.  RPDA lesion is 90% stenosed. Findings: RA = 17 RV = 63/17 PA = 70/21 (42) PCW = 27 Fick cardiac output/index = 4.0/2.2 PVR = 3.7 WU FA sat = 97% PA sat = 67%, 68% Assessment: 1. 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  2. We were unable to selectively engage LIMA due to subclavian tortuosity but non selective imaging shows it is widely patent 3. Severe ICM with elevated filling pressures and moderately depressed CO 4. Moderate mixed pulmonary HTN  CPX 11/18 FVC 2.77 (83%)    FEV1 2.14 (85%)     FEV1/FVC 77 (101%)   MVV 82 (81%) Resting HR: 64 Peak HR: 128  (90% age predicted max HR) BP rest: 100/64 BP peak: 126/60  Peak VO2: 18.5 (76% predicted peak VO2) VE/VCO2 slope: 37  OUES: 1.43 Peak  RER: 1.10 Ventilatory Threshold: 14.9 (67% predicted or measured peak VO2) VE/MVV: 74% O2pulse: 12  (100% predicted O2pulse)  Echo 11/18: EF 25-30% ECHO 04/16/2016: EF 20% IVC normal. RV mildly reduced Echo 8/15: EF 15-20% RV moderate dysfunction.   SH: Married, lives in [place], nonsmoker, rare ETOH.   FH: CAD  Review of Systems: [y] = yes, [ ]  = no   General: Weight gain [ ] ; Weight loss [ ] ; Anorexia Blue.Reese ]; Fatigue [ y]; Fever [ ] ; Chills [ ] ; Weakness [ ]   Cardiac: Chest pain/pressure Blue.Reese ]; Resting SOB [ ] ; Exertional SOB Blue.Reese ]; Orthopnea [ y]; Pedal Edema Blue.Reese ]; Palpitations [ ] ; Syncope [ ] ; Presyncope [ ] ; Paroxysmal nocturnal dyspnea[ ]   Pulmonary: Cough [ ] y; Wheezing[ ] ; Hemoptysis[ ] ; Sputum [ ] ; Snoring [ ]   GI: Vomiting[ ] ; Dysphagia[ ] ; Melena[ ] ; Hematochezia [ ] ; Heartburn[ ] ; Abdominal pain [ ] ; Constipation [ ] ; Diarrhea [ ] ; BRBPR [ ]   GU: Hematuria[ ] ; Dysuria [ ] ; Nocturia[ ]   Vascular: Pain in legs with walking [ ] ; Pain in feet with lying flat [ ] ; Non-healing sores [ ] ; Stroke [ ] ; TIA [ ] ; Slurred speech [ ] ;  Neuro: Headaches[ ] ; Vertigo[ ] ; Seizures[ ] ; Paresthesias[ ] ;Blurred vision [ ] ; Diplopia [ ] ; Vision changes [ ]   Ortho/Skin: Arthritis Blue.Reese ];  Joint pain [ y]; Muscle pain [ ] ; Joint swelling [ ] ; Back Pain [ ] ; Rash [ ]   Psych: Depression[ ] ; Anxiety[ ]   Heme: Bleeding problems [ ] ; Clotting disorders [ ] ; Anemia [ ]   Endocrine: Diabetes [ y]; Thyroid dysfunction[ ]    Home Medications Prior to Admission medications   Medication Sig Start Date End Date Taking? Authorizing Provider  acetaminophen (TYLENOL) 500 MG tablet Take 500-1,000 mg by mouth every 6 (six) hours as needed for mild pain.    Yes [provider]  albuterol (PROVENTIL HFA;VENTOLIN HFA) 108 (90 Base) MCG/ACT inhaler Inhale 2 puffs into the lungs every 4 (four) hours as needed for wheezing. 12/02/18  Yes Marrian Salvage, FNP  allopurinol (ZYLOPRIM) 100 MG tablet Take 1  tablet (100 mg total) by mouth daily at 8 pm. (1900) Patient taking differently: Take 100 mg by mouth every evening.  09/28/17  Yes Burns, Claudina Lick, MD  calcium carbonate (OS-CAL) 600 MG TABS Take 600 mg by mouth daily after breakfast.    Yes [provider]  Carboxymethylcellulose Sodium (THERATEARS) 0.25 % SOLN Place 1 drop into both eyes 3 (three) times daily as needed (for dry eyes).    Yes [provider]  carvedilol (COREG) 3.125 MG tablet TAKE ONE TABLET BY MOUTH TWICE DAILY WITH MEALS Patient taking differently: Take 1.5625 mg by mouth 2 (two) times daily.  01/19/19  Yes Bensimhon, Shaune Pascal, MD  colchicine 0.6 MG tablet Take 0.6 mg by mouth daily as needed (gout flare up).    Yes [provider]  digoxin (LANOXIN) 0.125 MG tablet TAKE ONE TABLET BY MOUTH ONE TIME DAILY  Patient taking differently: Take 0.0625 mg by mouth every evening.  09/01/18  Yes Bensimhon, Shaune Pascal, MD  ELIQUIS 5 MG TABS tablet TAKE 1 TABLET BY MOUTH TWICE A DAY Patient taking differently: Take 2.5 mg by mouth 2 (two) times daily.  06/13/16  Yes Clegg, Amy D, NP  famotidine (PEPCID) 10 MG tablet Take 10 mg by mouth daily before supper. 2 HRS BEFORE SUPPER.   Yes [provider]  FLUoxetine (PROZAC) 20 MG capsule Take 1 capsule (20 mg total) by mouth daily. -- Office visit needed for further refills 01/24/19  Yes Burns, Claudina Lick, MD  furosemide (LASIX) 80 MG tablet Take 80 mg by mouth daily.   Yes [provider]  ibuprofen (ADVIL,MOTRIN) 200 MG tablet Take 200-400 mg by mouth every 6 (six) hours as needed for moderate pain.   Yes [provider]  insulin glargine (LANTUS) 100 UNIT/ML injection Inject 10-15 Units into the skin at bedtime as needed (after eating a large meal). (2100)   Yes [provider]  loratadine (CLARITIN) 10 MG tablet Take 10 mg by mouth daily as needed for allergies.    Yes [provider]  metolazone (ZAROXOLYN) 5 MG tablet Take 1  tablet (5 mg total) by mouth once a week. Patient taking differently: Take 2.5 mg by mouth daily as needed (swelling/weight gain).  09/30/18  Yes Deboraha Sprang, MD  Multiple Vitamin (MULTIVITAMIN) tablet Take 1 tablet by mouth daily after breakfast.    Yes [provider]  Omega-3 Fatty Acids (FISH OIL) 1000 MG CAPS Take 1,000 mg by mouth daily after breakfast.   Yes [provider]  potassium chloride SA (K-DUR,KLOR-CON) 20 MEQ tablet TAKE ONE TABLET BY MOUTH TWICE DAILY AT 7AM AND 7PM Patient taking differently: Take 20 mEq by mouth 2 (two) times daily.  10/13/18  Yes Larey Dresser, MD  sacubitril-valsartan (ENTRESTO) 49-51 MG Take 1 tablet by mouth 2 (two) times daily. Patient taking differently: Take 0.5 tablets by mouth 2 (two) times daily.  02/27/17  Yes Bensimhon, Shaune Pascal, MD  spironolactone (ALDACTONE) 25 MG tablet Take 25 mg by mouth every evening.    Yes [provider]    Past Medical History: Past Medical History:  Diagnosis Date  . Anginal pain ([place])   . Congestive heart failure ([place])   . Diabetes mellitus type II    IDDM , [facility]  . Gout   . Hyperlipidemia   . Hypertension   . Ischemic heart disease   . Left bundle branch block   . Myocardial infarction ([place]) 2000; 2002; 2004; ~ 2006  . Ventricular tachycardia [facility])     Past Surgical History: Past Surgical History:  Procedure Laterality Date  . A-V CARDIAC PACEMAKER INSERTION  2002; 2010  . BIV ICD GENERATOR CHANGEOUT N/A 06/15/2017   Procedure: BiV ICD Generator Changeout;  Surgeon: Deboraha Sprang, MD;  Location: [facility] CV LAB;  Service: Cardiovascular;  Laterality: N/A;  . BIV ICD GENERTAOR CHANGE OUT N/A 12/16/2012   Procedure: BIV ICD GENERTAOR CHANGE OUT;  Surgeon: Deboraha Sprang, MD;  Location: [facility] CATH LAB;  Service: Cardiovascular;  Laterality: N/A;  . CARDIAC CATHETERIZATION     "3 or 4; never had interventions" (12/16/2012)  . CARDIAC CATHETERIZATION N/A 04/11/2016   Procedure:  Right Heart Cath;  Surgeon: Jolaine Artist, MD;  Location: [facility] CV LAB;  Service: Cardiovascular;  Laterality: N/A;  . CORONARY ARTERY BYPASS GRAFT  2000   CABG X4  . INSERT / REPLACE / REMOVE PACEMAKER  12/16/2012   LV lead placement; ICD assessment   . LEFT AND RIGHT HEART CATHETERIZATION WITH CORONARY/GRAFT ANGIOGRAM N/A 04/04/2014   Procedure: LEFT AND RIGHT HEART CATHETERIZATION WITH Beatrix Fetters;  Surgeon: Jolaine Artist, MD;  Location: [facility] CATH LAB;  Service: Cardiovascular;  Laterality: N/A;  . [place]  . URACHAL CYST EXCISION  ~ 1995  . VASECTOMY  ?35    Family History:  Family History  Problem Relation Age of Onset  . Heart attack Maternal Grandfather 78  . Heart attack Maternal Grandmother 82  . Heart attack Paternal Grandfather 47  . Heart attack Father 14  . Heart failure Father 27  . Aneurysm Brother   . Diabetes Neg Hx   . Stroke Neg Hx   . Cancer Neg Hx     Social History: Social History   Socioeconomic History  . Marital status: Married    Spouse name: Not on file  . Number of children: Not on file  . Years of education: Not on file  . Highest education level: Not on file  Occupational History  . Not on file  Social Needs  . Financial resource strain: Not on file  . Food insecurity:    Worry: Not on file    Inability: Not on file  . Transportation needs:    Medical: Not on file    Non-medical: Not on file  Tobacco Use  . Smoking status: Never Smoker  . Smokeless tobacco: Former Systems developer    Types: Snuff, Chew  . Tobacco comment: 12/16/2012 "stopped chew/snuff in ~ 1992 after 15 yr"  Substance and Sexual Activity  . Alcohol use: Yes    Alcohol/week: 7.0 standard drinks    Types: 7 Shots of liquor per week  Comment: one shot a day  . Drug use: No    Comment: none  . Sexual activity: Yes  Lifestyle  . Physical activity:    Days per week: Not on file    Minutes per session: Not on file  .  Stress: Not on file  Relationships  . Social connections:    Talks on phone: Not on file    Gets together: Not on file    Attends religious service: Not on file    Active member of club or organization: Not on file    Attends meetings of clubs or organizations: Not on file    Relationship status: Not on file  Other Topics Concern  . Not on file  Social History Narrative   Lives with wife in [place].     Allergies:  Allergies  Allergen Reactions  . Ampicillin Rash    Did it involve swelling of the face/tongue/throat, SOB, or low BP? No Did it involve sudden or severe rash/hives, skin peeling, or any reaction on the inside of your mouth or nose? Yes Did you need to seek medical attention at a hospital or doctor's office? Yes When did it last happen?25+ years If all above answers are "NO", may proceed with cephalosporin use.    Alveta Heimlich [Rosuvastatin Calcium] Rash and Other (See Comments)    Rash as nodules ("like strawberries")  . Orange Fruit [Citrus] Swelling and Other (See Comments)    Lips swelling, severe headache (including lemons and other citrus fruits)  . Propoxyphene N-Acetaminophen Other (See Comments)    hallucinations  [darvicet]    Objective:    Vital Signs:   Temp:  [98.1 F (36.7 C)-98.2 F (36.8 C)] 98.2 F (36.8 C) (02/27 1120) Pulse Rate:  [64-166] 64 (02/27 1225) Resp:  [11-38] 18 (02/27 1225) BP: (94-113)/(58-77) 107/73 (02/27 1225) SpO2:  [88 %-98 %] 98 % (02/27 1225) Weight:  [72.1 kg] 72.1 kg (02/27 0739)   Filed Weights   02/03/19 0739  Weight: 72.1 kg     Physical Exam     General:  Elderly. No respiratory difficulty HEENT: Normal Neck: Supple. JVP to jaw. Carotids 2+ bilat; no bruits. No lymphadenopathy or thyromegaly appreciated. Cor: PMI nondisplaced. Regular rate & rhythm. No rubs, gallops or murmurs. Lungs: Clear. On 4L O2.  Abdomen: Soft, nontender, +distended. No hepatosplenomegaly. No bruits or masses. Good  bowel sounds. Extremities: No cyanosis, clubbing, rash, edema. Right groin CDI, soft.  Neuro: Alert & oriented x 3, cranial nerves grossly intact. moves all 4 extremities w/o difficulty. Affect pleasant.   Telemetry   BiV paced 70s. Personally reviewed.   EKG   Pending.   Labs     Basic Metabolic Panel: Recent Labs  Lab 02/03/19 0600  NA 133*  K 3.2*  CL 92*  CO2 25  GLUCOSE 144*  BUN 32*  CREATININE 1.32*  CALCIUM 8.5*  MG 2.4    Liver Function Tests: No results for input(s): AST, ALT, ALKPHOS, BILITOT, PROT, ALBUMIN in the last 168 hours. No results for input(s): LIPASE, AMYLASE in the last 168 hours. No results for input(s): AMMONIA in the last 168 hours.  CBC: Recent Labs  Lab 02/02/19 1500  WBC 12.2*  HGB 15.1  HCT 47.7  MCV 97.0  PLT 282    Cardiac Enzymes: No results for input(s): CKTOTAL, CKMB, CKMBINDEX, TROPONINI in the last 168 hours.  BNP: BNP (last 3 results) Recent Labs    02/02/19 1500  BNP 3,871.3*  ProBNP (last 3 results) No results for input(s): PROBNP in the last 8760 hours.   CBG: Recent Labs  Lab 02/03/19 0749  GLUCAP 144*    Coagulation Studies: Recent Labs    02/02/19 1500  LABPROT 17.9*  INR 1.5*    Imaging:  No results found.   Patient Profile   Nels Munn is a 78 y.o. male with history of CAD s/p CABG 2000, ischemic cardiomyopathy (20%) with St Jude CRT-D, DM2, paroxysmal atrial fibrillation, and recurrent syncope of uncertain etiology.    Admitted post cath with volume overload and increased O2 requirement.   Assessment/Plan   1. Acute on chronic systolic CHF: Ischemic cardiomyopathy s/p St Jude CRT-D, EF 20%(04/2016)  - Echo 11/18 EF stable 25-30% - CPX 11/18 Peak VO2: 18.5 (76% predicted peak VO2) VE/VCO2 slope: 37  - Volume elevated on cath and exam. CO moderately low.  - Received 120 mg IV lasix + 5 mg metolazone in cath lab holding. Start 80 mg IV lasix BID tonight.  - He says the [facility]  sent him Entresto 97/103 and a pill cutter and hold him to cut in half instead of taking Entresto 49/51 mg BID. Advised that you should not cut Entresto in half and [facility]. Continue Entresto 49/51 mg BID for now. SBP on soft side post cath, so will add hold parameters  - Continue spironolactone 25 mg daily - Increase digoxin to 0.125 mg daily. He has been cutting this one in half too. Unclear why. No high dig levels in epic. Check level in am.  - Hold coreg with volume overload and marginal output.  - Repeat echo.  2. CAD:  - Patent grafts on LHC in 4/15. Continue statin.  With Eliquis so no longer taking aspirin. Can drop dose to 2.5 bid when he turns 1    - LHC today showed severe 3V CAD with all grafts patent.  3. Paroxysmal atrial fibrillation:   - Continue eliquis 5 mg twice a day to resume tonight. Med rec says he has been cutting Eliquis in half too, but he denies this.  4. H/o Recurrent syncope  - Had syncope last week. Check myeloma panel in am.  5. ICD  - Follows with Dr. Caryl Comes. No change. 6. CKD, III-IV - Monitor daily BMET 7. Dizziness on turning head to right - No carotid bruits or hypersensitvity on exam.  - R ICA ok. L 1-39% on 1/19 - As above check myeloma panel in am.  8. DM2 - followed by PCP - consider Jardiance. Maybe at discharge. 9. Hypokalemia - K 3.2. Supp K.    Georgiana Shore, NP 02/03/2019, 12:28 PM  Advanced Heart Failure Team Pager [phone] (M-F; 7a - 4p)  Please contact [facility] for night-coverage after hours (4p -7a ) and weekends on [email or web address]  Patient seen and examined with the above-signed Advanced Practice Provider and/or Housestaff. I personally reviewed laboratory data, imaging studies and relevant notes. I independently examined the patient and formulated the important aspects of the plan. I have edited the note to reflect any of my changes or salient points. I have personally discussed the plan with the patient and/or  family.  Has been struggling with increased HF symptoms for nearly 2 weeks. Not responding well to po diuretics including metolazone. Cath today with elevated filling pressures and moderately decreased CO. Coronary anatomy stable.  Developed respiratory distress after cath. Will admit for IV diuresis. Watch renal function with dye load and diuresis.  Exam JVP to jaw. Mildly dyspneic Cor RRR  Lungs crackles in bases Ab obese NT Ext 1+ edema warm.   Glori Bickers, MD  3:19 PM

## 2019-02-03 NOTE — Progress Notes (Deleted)
Patient had 7 beats VT at 0920.  Patient asymptomatic.  Lillia Mountain, NP notified.

## 2019-02-03 NOTE — Progress Notes (Signed)
Upon entering the room patient found with oxygen off. Checked pt o2 Sat was 82. Pt placed on 3L and o2 sat went 95. Pt educated to leave oxygen on. Pt agrees.

## 2019-02-03 NOTE — Telephone Encounter (Signed)
noted 

## 2019-02-03 NOTE — Interval H&P Note (Signed)
History and Physical Interval Note:  02/03/2019 9:46 AM  Mason Arnold  has presented today for surgery, with the diagnosis of CHF  The various methods of treatment have been discussed with the patient and family. After consideration of risks, benefits and other options for treatment, the patient has consented to  Procedure(s): RIGHT/LEFT HEART CATH AND CORONARY/GRAFT ANGIOGRAPHY (N/A) and possible coronary angiography as a surgical intervention .  The patient's history has been reviewed, patient examined, no change in status, stable for surgery.  I have reviewed the patient's chart and labs.  Questions were answered to the patient's satisfaction.     Mason Arnold

## 2019-02-04 ENCOUNTER — Inpatient Hospital Stay (HOSPITAL_COMMUNITY): Payer: Medicare Other

## 2019-02-04 DIAGNOSIS — J9621 Acute and chronic respiratory failure with hypoxia: Secondary | ICD-10-CM

## 2019-02-04 DIAGNOSIS — I351 Nonrheumatic aortic (valve) insufficiency: Secondary | ICD-10-CM

## 2019-02-04 LAB — CBC
HCT: 40.6 % (ref 39.0–52.0)
Hemoglobin: 13.5 g/dL (ref 13.0–17.0)
MCH: 30.6 pg (ref 26.0–34.0)
MCHC: 33.3 g/dL (ref 30.0–36.0)
MCV: 92.1 fL (ref 80.0–100.0)
PLATELETS: 250 10*3/uL (ref 150–400)
RBC: 4.41 MIL/uL (ref 4.22–5.81)
RDW: 14.3 % (ref 11.5–15.5)
WBC: 10.2 10*3/uL (ref 4.0–10.5)
nRBC: 0 % (ref 0.0–0.2)

## 2019-02-04 LAB — BASIC METABOLIC PANEL
Anion gap: 13 (ref 5–15)
Anion gap: 12 (ref 5–15)
BUN: 29 mg/dL — ABNORMAL HIGH (ref 8–23)
BUN: 30 mg/dL — ABNORMAL HIGH (ref 8–23)
CHLORIDE: 91 mmol/L — AB (ref 98–111)
CO2: 29 mmol/L (ref 22–32)
CO2: 33 mmol/L — ABNORMAL HIGH (ref 22–32)
Calcium: 8.4 mg/dL — ABNORMAL LOW (ref 8.9–10.3)
Calcium: 8.8 mg/dL — ABNORMAL LOW (ref 8.9–10.3)
Chloride: 88 mmol/L — ABNORMAL LOW (ref 98–111)
Creatinine, Ser: 1.34 mg/dL — ABNORMAL HIGH (ref 0.61–1.24)
Creatinine, Ser: 1.4 mg/dL — ABNORMAL HIGH (ref 0.61–1.24)
GFR calc Af Amer: 58 mL/min — ABNORMAL LOW (ref >60)
GFR calc Af Amer: 55 mL/min — ABNORMAL LOW (ref >60)
GFR calc non Af Amer: 50 mL/min — ABNORMAL LOW (ref >60)
GFR calc non Af Amer: 47 mL/min — ABNORMAL LOW (ref >60)
GLUCOSE: 115 mg/dL — AB (ref 70–99)
Glucose, Bld: 134 mg/dL — ABNORMAL HIGH (ref 70–99)
Potassium: 3.1 mmol/L — ABNORMAL LOW (ref 3.5–5.1)
Potassium: 3.2 mmol/L — ABNORMAL LOW (ref 3.5–5.1)
Sodium: 133 mmol/L — ABNORMAL LOW (ref 135–145)
Sodium: 133 mmol/L — ABNORMAL LOW (ref 135–145)

## 2019-02-04 LAB — GLUCOSE, CAPILLARY
GLUCOSE-CAPILLARY: 158 mg/dL — AB (ref 70–99)
Glucose-Capillary: 128 mg/dL — ABNORMAL HIGH (ref 70–99)
Glucose-Capillary: 155 mg/dL — ABNORMAL HIGH (ref 70–99)
Glucose-Capillary: 120 mg/dL — ABNORMAL HIGH (ref 70–99)

## 2019-02-04 LAB — ECHOCARDIOGRAM COMPLETE
Height: 65 in
Weight: 2428.8 oz

## 2019-02-04 LAB — DIGOXIN LEVEL: Digoxin Level: 0.6 ng/mL — ABNORMAL LOW (ref 0.8–2.0)

## 2019-02-04 LAB — MAGNESIUM: Magnesium: 2.2 mg/dL (ref 1.7–2.4)

## 2019-02-04 MED ORDER — SACUBITRIL-VALSARTAN 24-26 MG PO TABS
1.0000 | ORAL_TABLET | Freq: Two times a day (BID) | ORAL | Status: DC
  Administered 2019-02-04 – 2019-02-06 (×5): 1 via ORAL
  Filled 2019-02-04 (×8): qty 1

## 2019-02-04 MED ORDER — ORAL CARE MOUTH RINSE
15.0000 mL | Freq: Two times a day (BID) | OROMUCOSAL | Status: DC
  Administered 2019-02-05 (×2): 15 mL via OROMUCOSAL

## 2019-02-04 MED ORDER — DIGOXIN 125 MCG PO TABS: 0.0625 mg | ORAL_TABLET | Freq: Every evening | ORAL | Status: DC

## 2019-02-04 MED ORDER — POTASSIUM CHLORIDE CRYS ER 20 MEQ PO TBCR
40.0000 meq | EXTENDED_RELEASE_TABLET | Freq: Once | ORAL | Status: AC
  Administered 2019-02-04: 40 meq via ORAL
  Filled 2019-02-04: qty 2

## 2019-02-04 MED ORDER — METOLAZONE 5 MG PO TABS
5.0000 mg | ORAL_TABLET | Freq: Once | ORAL | Status: AC
  Administered 2019-02-04: 5 mg via ORAL
  Filled 2019-02-04: qty 1

## 2019-02-04 MED ORDER — POTASSIUM CHLORIDE CRYS ER 20 MEQ PO TBCR
40.0000 meq | EXTENDED_RELEASE_TABLET | Freq: Two times a day (BID) | ORAL | Status: AC
  Administered 2019-02-04 (×2): 40 meq via ORAL
  Filled 2019-02-04 (×2): qty 2

## 2019-02-04 NOTE — Progress Notes (Signed)
  Echocardiogram 2D Echocardiogram has been performed.  Mason Arnold 02/04/2019, 12:37 PM

## 2019-02-04 NOTE — Progress Notes (Addendum)
SATURATION QUALIFICATIONS: (This note is used to comply with regulatory documentation for home oxygen)  Patient Saturations on Room Air at Rest = 97%  Patient Saturations on Room Air while Ambulating = 87%  Patient Saturations on 3 Liters of oxygen while Ambulating = 98%

## 2019-02-04 NOTE — Progress Notes (Signed)
RN paged cardiology on call regarding patient's scheduled Entresto.  Last dose given was at 1617 due to patient's blood pressure.  MD advised RN to hold 2200 Entresto dose and give the next morning dose a little early to get patient back on schedule.

## 2019-02-04 NOTE — Evaluation (Signed)
Physical Therapy Evaluation Patient Details Name: Mason Arnold MRN: [id number] DOB: 12-13-1940 Today's Date: 02/04/2019   History of Present Illness  Pt is a 78 y/o male admitted secondary to cough and SOB. Admitted after cath with volume overload and hypoxia. PMH including but not limited to DM, HTN and CHF.    Clinical Impression  Pt presented supine in bed with HOB elevated, awake and willing to participate in therapy session. Prior to admission, pt reported that he was independent with all functional mobility and ADLs. Pt lives with his spouse in a single level home with five steps to enter. He is currently limited secondary to fatigue and poor cardiopulmonary endurance. Pt ambulated on RA with SPO2 decreasing to as low as 80%, requiring standing/seated rest breaks with pursed lip breathing to return to 92%. Pt would continue to benefit from skilled physical therapy services at this time while admitted and after d/c to address the below listed limitations in order to improve overall safety and independence with functional mobility.     Follow Up Recommendations Home health PT    Equipment Recommendations  None recommended by PT    Recommendations for Other Services       Precautions / Restrictions Precautions Precaution Comments: monitor SPO2 Restrictions Weight Bearing Restrictions: No      Mobility  Bed Mobility Overal bed mobility: Needs Assistance Bed Mobility: Supine to Sit     Supine to sit: Supervision     General bed mobility comments: supervision for safety  Transfers Overall transfer level: Needs assistance Equipment used: None Transfers: Sit to/from Stand Sit to Stand: Supervision         General transfer comment: for safety  Ambulation/Gait Ambulation/Gait assistance: Min guard Gait Distance (Feet): 100 Feet(100' x2 with a standing rest break in between) Assistive device: None Gait Pattern/deviations: Step-through pattern;Decreased stride  length;Drifts right/left Gait velocity: decreased   General Gait Details: pt with mild instability, especially with head turns and when navigating around obstacles in hallway, minor LOB that he was able to self correct  Stairs            Wheelchair Mobility    Modified Rankin (Stroke Patients Only)       Balance Overall balance assessment: Needs assistance Sitting-balance support: Feet supported Sitting balance-Leahy Scale: Good     Standing balance support: No upper extremity supported Standing balance-Leahy Scale: Fair                               Pertinent Vitals/Pain Pain Assessment: No/denies pain    Home Living Family/patient expects to be discharged to:: Private residence Living Arrangements: Spouse/significant other Available Help at Discharge: Family Type of Home: House Home Access: Stairs to enter Entrance Stairs-Rails: Psychiatric nurse of Steps: 5 Home Layout: One level Home Equipment: Environmental consultant - 2 wheels;Cane - single point      Prior Function Level of Independence: Independent               Hand Dominance        Extremity/Trunk Assessment   Upper Extremity Assessment Upper Extremity Assessment: Overall WFL for tasks assessed    Lower Extremity Assessment Lower Extremity Assessment: Overall WFL for tasks assessed       Communication   Communication: No difficulties  Cognition Arousal/Alertness: Awake/alert Behavior During Therapy: WFL for tasks assessed/performed Overall Cognitive Status: Within Functional Limits for tasks assessed  General Comments      Exercises     Assessment/Plan    PT Assessment Patient needs continued PT services  PT Problem List Decreased activity tolerance;Decreased balance;Decreased mobility;Decreased coordination;Decreased knowledge of use of DME;Decreased safety awareness;Decreased knowledge of  precautions;Cardiopulmonary status limiting activity       PT Treatment Interventions DME instruction;Gait training;Stair training;Therapeutic activities;Functional mobility training;Therapeutic exercise;Balance training;Neuromuscular re-education;Patient/family education    PT Goals (Current goals can be found in the Care Plan section)  Acute Rehab PT Goals Patient Stated Goal: improve balance and breathing PT Goal Formulation: With patient/family Time For Goal Achievement: 02/18/19 Potential to Achieve Goals: Good    Frequency Min 3X/week   Barriers to discharge        Co-evaluation               AM-PAC PT "6 Clicks" Mobility  Outcome Measure Help needed turning from your back to your side while in a flat bed without using bedrails?: None Help needed moving from lying on your back to sitting on the side of a flat bed without using bedrails?: None Help needed moving to and from a bed to a chair (including a wheelchair)?: None Help needed standing up from a chair using your arms (e.g., wheelchair or bedside chair)?: None Help needed to walk in hospital room?: None Help needed climbing 3-5 steps with a railing? : A Little 6 Click Score: 23    End of Session   Activity Tolerance: Patient limited by fatigue Patient left: in bed;with call bell/phone within reach;with bed alarm set;with family/visitor present;Other (comment)(sitting EOB) Nurse Communication: Mobility status PT Visit Diagnosis: Other abnormalities of gait and mobility (R26.89)    Time: 4034-7425 PT Time Calculation (min) (ACUTE ONLY): 21 min   Charges:   PT Evaluation $PT Eval Moderate Complexity: 1 Mod          Sherie Don, PT, DPT  Acute Rehabilitation Services Pager [phone] Office [place] 02/04/2019, 3:47 PM

## 2019-02-04 NOTE — Progress Notes (Addendum)
Advanced Heart Failure Rounding Note  PCP-Cardiologist: Glori Bickers, MD   Subjective:    Admitted after cath with volume overload and hypoxia. Diuresed with 120 + 80 mg IV lasix + 5 mg metolazone. I/O negative 2.2 L. Weight down 8 lbs. Creatinine stable 1.32.   SBP 90-110s. Entresto held last night with SBP 90s. Dig level 0.6 on higher dose of dig. Hypoxic overnight to low 80s off O2.   Remains SOB with talking. Feels slightly better. Says he holds onto his fluid in his belly and was having presyncope when he stood up at home.   R/LHC 02/03/19:  Ost LAD lesion is 100% stenosed.  Prox Cx to Mid Cx lesion is 60% stenosed.  Ost 3rd Mrg lesion is 100% stenosed.  Ost Ramus lesion is 100% stenosed.  Mid RCA lesion is 70% stenosed.  Dist RCA lesion is 100% stenosed.  Origin to Prox Graft lesion is 30% stenosed.  RPDA lesion is 90% stenosed. Findings: RA = 17 RV = 63/17 PA = 70/21 (42) PCW = 27 Fick cardiac output/index = 4.0/2.2 PVR = 3.7 WU FA sat = 97% PA sat = 67%, 68% Assessment: 1. 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  2. We were unable to selectively engage LIMA due to subclavian tortuosity but non selective imaging shows it is widely patent 3. Severe ICM with elevated filling pressures and moderately depressed CO 4. Moderate mixed pulmonary HTN  Objective:   Weight Range: 68.9 kg Body mass index is 25.26 kg/m.   Vital Signs:   Temp:  [97.8 F (36.6 C)-98.6 F (37 C)] 98.5 F (36.9 C) (02/28 0442) Pulse Rate:  [59-166] 68 (02/28 0442) Resp:  [11-38] 18 (02/28 0442) BP: (94-122)/(58-87) 99/71 (02/28 0442) SpO2:  [88 %-100 %] 95 % (02/28 0442) Weight:  [68.9 kg] 68.9 kg (02/28 0442) Last BM Date: 02/03/19  Weight change: Filed Weights   02/03/19 0739 02/04/19 0442  Weight: 72.1 kg 68.9 kg    Intake/Output:   Intake/Output Summary (Last 24 hours) at 02/04/2019 0757 Last data filed at 02/04/2019 0442 Gross per 24 hour  Intake 532.83 ml    Output 2750 ml  Net -2217.17 ml      Physical Exam    General:  SOB with talking.  HEENT: Normal Neck: Supple. JVP to jaw. Carotids 2+ bilat; no bruits. No lymphadenopathy or thyromegaly appreciated. Cor: PMI nondisplaced. Regular rate & rhythm. No rubs, gallops or murmurs. Lungs: Clear ON 3 L [place] Abdomen: Soft, nontender, nondistended. No hepatosplenomegaly. No bruits or masses. Good bowel sounds. Extremities: No cyanosis, clubbing, rash, edema.  Neuro: Alert & orientedx3, cranial nerves grossly intact. moves all 4 extremities w/o difficulty. Affect pleasant   Telemetry   BiV paced, sometimes AV paced 70s. Personally reviewed.   EKG    AV paced 68 bpm. Personally reviewed.   Labs    CBC Recent Labs    02/02/19 1500  02/03/19 1029 02/04/19 0502  WBC 12.2*  --   --  10.2  HGB 15.1   < > 13.6  13.6 13.5  HCT 47.7   < > 40.0  40.0 40.6  MCV 97.0  --   --  92.1  PLT 282  --   --  250   < > = values in this interval not displayed.   Basic Metabolic Panel Recent Labs    02/03/19 0600  02/03/19 1029 02/04/19 0502  NA 133*   < > 134*  135 133*  K  3.2*   < > 3.0*  2.8* 3.1*  CL 92*  --   --  91*  CO2 25  --   --  29  GLUCOSE 144*  --   --  134*  BUN 32*  --   --  29*  CREATININE 1.32*  --   --  1.34*  CALCIUM 8.5*  --   --  8.4*  MG 2.4  --   --   --    < > = values in this interval not displayed.   Liver Function Tests No results for input(s): AST, ALT, ALKPHOS, BILITOT, PROT, ALBUMIN in the last 72 hours. No results for input(s): LIPASE, AMYLASE in the last 72 hours. Cardiac Enzymes No results for input(s): CKTOTAL, CKMB, CKMBINDEX, TROPONINI in the last 72 hours.  BNP: BNP (last 3 results) Recent Labs    02/02/19 1500  BNP 3,871.3*    ProBNP (last 3 results) No results for input(s): PROBNP in the last 8760 hours.   D-Dimer No results for input(s): DDIMER in the last 72 hours. Hemoglobin A1C No results for input(s): HGBA1C in the last 72  hours. Fasting Lipid Panel No results for input(s): CHOL, HDL, LDLCALC, TRIG, CHOLHDL, LDLDIRECT in the last 72 hours. Thyroid Function Tests No results for input(s): TSH, T4TOTAL, T3FREE, THYROIDAB in the last 72 hours.  Invalid input(s): FREET3  Other results:   Imaging     No results found.   Medications:     Scheduled Medications: . allopurinol  100 mg Oral QPM  . apixaban  5 mg Oral BID  . digoxin  0.125 mg Oral QPM  . famotidine  10 mg Oral QAC supper  . FLUoxetine  20 mg Oral Daily  . furosemide  80 mg Intravenous BID  . insulin aspart  0-5 Units Subcutaneous QHS  . insulin aspart  0-9 Units Subcutaneous TID WC  . [START ON 02/05/2019] mouth rinse  15 mL Mouth Rinse BID  . sacubitril-valsartan  1 tablet Oral BID  . sodium chloride flush  3 mL Intravenous Q12H  . sodium chloride flush  3 mL Intravenous Q12H  . spironolactone  25 mg Oral QPM     Infusions: . sodium chloride Stopped (02/03/19 2026)  . sodium chloride    . sodium chloride       PRN Medications:  sodium chloride, sodium chloride, acetaminophen, albuterol, colchicine, loratadine, ondansetron (ZOFRAN) IV, polyvinyl alcohol, sodium chloride flush, sodium chloride flush    Patient Profile   Mason Arnold a 78 y.o.malewith history of CAD s/p CABG 2000, ischemic cardiomyopathy (20%) with St Jude CRT-D, DM2, paroxysmal atrial fibrillation, and recurrent syncope of uncertain etiology.   Admitted post cath with volume overload and increased O2 requirement.   Assessment/Plan   1. Acute on chronic systolic CHF: Ischemic cardiomyopathy s/p St Jude CRT-D, EF 20%(04/2016) - Echo 11/18 EF stable 25-30% - CPX 11/18 Peak VO2: 18.5 (76% predicted peak VO2) VE/VCO2 slope: 37 - Volume remains elevated.  - Continue 80 mg IV lasix BID. Repeat 5 mg metolazone today. He was on lasix 80 mg daily PTA. Probably would benefit from transition to torsemide at DC.  - SBP 90-110s. Having presyncopal  episodes at home. Did not receive Entresto last night. Decrease dose to 24/26 mg BID.  - Continue spironolactone 25 mg daily - Decrease digoxin back to 0.0625 mg daily with dig level 0.6. - Hold coreg with volume overload and marginal output.  - Repeat echo. Ordered, not yet done.  2. CAD: - Patent grafts on LHC in 4/15. Continue statin. With Eliquis so no longer taking aspirin. Can drop dose to 2.5 bid when he turns 79 - LHC 2/27 showed severe 3V CAD with all grafts patent.  - No s/s ischemia.  3. Paroxysmal atrial fibrillation:  - Continue eliquis 5 mg BID.  4. H/o Recurrent syncope  -Had syncope last week.Myeloma panel pending - BP running on soft side 90-110s. He did not get Entresto last night. Will cut back dose. See above.  5. ICD  - Follows with Dr. Caryl Comes. No change.  6. CKD, III-IV - Creatinine stable 1.34. 7. Dizziness on turning head to right - No carotid bruits or hypersensitvity on exam.  - R ICA ok. L 1-39% on 1/19. No change.  8. DM2 - followed by PCP - consider Jardiance. No change.  9. Hypokalemia - K 3.1. Supp.  10. Acute hypoxic respiratory failure - Still requiring 3 L O2. Evaluate for home O2. Will likely improve with getting fluid off, but would like to have set up if needed in case he goes home over weekend.    Length of Stay: [place], NP  02/04/2019, 7:57 AM  Advanced Heart Failure Team Pager [phone] (M-F; 7a - 4p)  Please contact [facility] for night-coverage after hours (4p -7a ) and weekends on [email or web address]  Patient seen and examined with the above-signed Advanced Practice Provider and/or Housestaff. I personally reviewed laboratory data, imaging studies and relevant notes. I independently examined the patient and formulated the important aspects of the plan. I have edited the note to reflect any of my changes or salient points. I have personally discussed the plan with the patient and/or family.  He remains dyspneic at rest with  frequent hypoxemia. JVP remains elevated. Fortunately he is beginning to respond to IV lasix. Will continue. Creatinine stable post-cath which showed stable revascularization. Echo viewed personally. EF 20%. Remains in NSR. Continue Eliquis,   Glori Bickers, MD  8:29 PM

## 2019-02-04 NOTE — Care Management Note (Signed)
Case Management Note  Patient Details  Name: Mason Arnold MRN: [id number] Date of Birth: 05/02/1941  Subjective/Objective:      Pt is a 78 y/o male admitted secondary to cough and SOB. Admitted after cath with volume overload and hypoxia. PMH including but not limited to DM, HTN and CHF.  PTA, pt independent, lives at home with spouse.               Action/Plan: Pt has PCP at [facility], Dr Krystal Eaton.  He has never used HH before, but is open to it, if needed.  He may need home oxygen, as continues to desaturate with activity.    Expected Discharge Date:  02/03/19               Expected Discharge Plan:  Home/Self Care  In-House Referral:     Discharge planning Services  CM Consult  Post Acute Care Choice:  Durable Medical Equipment Choice offered to:  Patient  DME Arranged:  Oxygen DME Agency:  [place]:    [facility] Agency:     Status of Service:  Completed, signed off  If discussed at [place] of Stay Meetings, dates discussed:    Additional Comments:  02/04/19 J. Keltin Baird, RN, BSN Referral to [facility] for home oxygen; per CHF team NP, pt for dc Sat or Sun.   Pt made aware of dc home on oxygen, and that portable tank will be delivered to room prior to dc.   Ella Bodo, RN 02/04/2019, 4:15 PM

## 2019-02-04 NOTE — Plan of Care (Signed)
  Problem: Clinical Measurements: Goal: Respiratory complications will improve Outcome: Progressing Goal: Cardiovascular complication will be avoided Outcome: Progressing   Problem: Activity: Goal: Risk for activity intolerance will decrease Outcome: Progressing   Problem: Nutrition: Goal: Adequate nutrition will be maintained Outcome: Progressing   

## 2019-02-05 ENCOUNTER — Inpatient Hospital Stay (HOSPITAL_COMMUNITY): Payer: Medicare Other

## 2019-02-05 LAB — BASIC METABOLIC PANEL
Anion gap: 13 (ref 5–15)
BUN: 26 mg/dL — ABNORMAL HIGH (ref 8–23)
CO2: 31 mmol/L (ref 22–32)
CREATININE: 1.25 mg/dL — AB (ref 0.61–1.24)
Calcium: 8.8 mg/dL — ABNORMAL LOW (ref 8.9–10.3)
Chloride: 91 mmol/L — ABNORMAL LOW (ref 98–111)
GFR calc Af Amer: >60 mL/min (ref >60)
GFR calc non Af Amer: 54 mL/min — ABNORMAL LOW (ref >60)
GLUCOSE: 114 mg/dL — AB (ref 70–99)
Potassium: 3.2 mmol/L — ABNORMAL LOW (ref 3.5–5.1)
Sodium: 135 mmol/L (ref 135–145)

## 2019-02-05 LAB — GLUCOSE, CAPILLARY
Glucose-Capillary: 110 mg/dL — ABNORMAL HIGH (ref 70–99)
Glucose-Capillary: 287 mg/dL — ABNORMAL HIGH (ref 70–99)
Glucose-Capillary: 153 mg/dL — ABNORMAL HIGH (ref 70–99)
Glucose-Capillary: 160 mg/dL — ABNORMAL HIGH (ref 70–99)

## 2019-02-05 MED ORDER — POTASSIUM CHLORIDE CRYS ER 20 MEQ PO TBCR
40.0000 meq | EXTENDED_RELEASE_TABLET | Freq: Once | ORAL | Status: AC
  Administered 2019-02-05: 40 meq via ORAL
  Filled 2019-02-05: qty 2

## 2019-02-05 MED ORDER — DIGOXIN 125 MCG PO TABS
0.1250 mg | ORAL_TABLET | Freq: Every evening | ORAL | Status: DC
  Administered 2019-02-05: 0.125 mg via ORAL
  Filled 2019-02-05: qty 1

## 2019-02-05 NOTE — Progress Notes (Signed)
Paged MD regarding lab results.  MD gave verbal order for additional 53mEq of PO potassium.

## 2019-02-05 NOTE — Evaluation (Signed)
Occupational Therapy Evaluation Patient Details Name: Mason Arnold MRN: [id number] DOB: 12-15-1940 Today's Date: 02/05/2019    History of Present Illness Pt is a 78 y/o male admitted secondary to cough and SOB. Admitted after cath with volume overload and hypoxia. PMH including but not limited to DM, HTN and CHF.   Clinical Impression   Pt PTA: living with very supportive spouse and performing ADL and mobility with modified independence. Pt currently performing ADL with set-upA and ADL functional mobility with minguardA for initial standing balance then pt requires supervisionA for mobility in hallway 200'. Pt reports this imbalance is normal for him. Pt requiring O2 at this time desatting to 86% on 2L O2 bouncing between 2-3L O2, pt able to stay  >90% with pursed lip breathing techniques.  Pt tolerating session well. Pt does not require OT follow up as pt is at his functional baseline for ADL. OT signing off.    Follow Up Recommendations  No OT follow up;Supervision - Intermittent    Equipment Recommendations  None recommended by OT    Recommendations for Other Services       Precautions / Restrictions Precautions Precaution Comments: monitor SPO2 Restrictions Weight Bearing Restrictions: No      Mobility Bed Mobility Overal bed mobility: Modified Independent                Transfers Overall transfer level: Needs assistance Equipment used: None Transfers: Sit to/from Stand Sit to Stand: Supervision         General transfer comment: for safety    Balance Overall balance assessment: Needs assistance Sitting-balance support: Feet supported Sitting balance-Leahy Scale: Good     Standing balance support: No upper extremity supported Standing balance-Leahy Scale: Fair Standing balance comment: requiring steadying for initial standing balance                           ADL either performed or assessed with clinical judgement   ADL Overall ADL's :  At baseline                                       General ADL Comments: Pt requires O2 with movement.     Vision Baseline Vision/History: Wears glasses Vision Assessment?: No apparent visual deficits     Perception     Praxis      Pertinent Vitals/Pain Pain Assessment: No/denies pain     Hand Dominance     Extremity/Trunk Assessment Upper Extremity Assessment Upper Extremity Assessment: Overall WFL for tasks assessed   Lower Extremity Assessment Lower Extremity Assessment: Overall WFL for tasks assessed   Cervical / Trunk Assessment Cervical / Trunk Assessment: Normal   Communication Communication Communication: No difficulties   Cognition   Behavior During Therapy: WFL for tasks assessed/performed Overall Cognitive Status: Within Functional Limits for tasks assessed                                     General Comments  requires O2 on 2L, pt desats to 86%.    Exercises     Shoulder Instructions      Home Living Family/patient expects to be discharged to:: Private residence Living Arrangements: Spouse/significant other Available Help at Discharge: Family Type of Home: House Home Access: Stairs to enter [employer] of Steps: 5  Entrance Stairs-Rails: Can reach both Home Layout: One level     Bathroom Shower/Tub: Teacher, early years/pre: Standard     Home Equipment: Environmental consultant - 2 wheels;Cane - single point;Grab bars - tub/shower          Prior Functioning/Environment Level of Independence: Independent                 OT Problem List: Decreased activity tolerance;Impaired balance (sitting and/or standing);Decreased safety awareness      OT Treatment/Interventions:      OT Goals(Current goals can be found in the care plan section) Acute Rehab OT Goals Patient Stated Goal: improve balance and breathing  OT Frequency:     Barriers to D/C:            Co-evaluation               AM-PAC OT "6 Clicks" Daily Activity     Outcome Measure Help from another person eating meals?: None Help from another person taking care of personal grooming?: None Help from another person toileting, which includes using toliet, bedpan, or urinal?: None Help from another person bathing (including washing, rinsing, drying)?: A Little Help from another person to put on and taking off regular upper body clothing?: None Help from another person to put on and taking off regular lower body clothing?: None 6 Click Score: 23   End of Session Equipment Utilized During Treatment: Gait belt Nurse Communication: Mobility status  Activity Tolerance: Patient tolerated treatment well Patient left: in bed;with call bell/phone within reach;with family/visitor present  OT Visit Diagnosis: Unsteadiness on feet (R26.81);Muscle weakness (generalized) (M62.81)                Time: 5329-9242 OT Time Calculation (min): 33 min Charges:  OT General Charges $OT Visit: 1 Visit OT Evaluation $OT Eval Moderate Complexity: 1 Mod OT Treatments $Self Care/Home Management : 8-22 mins  Ebony Hail Harold Hedge) Marsa Aris OTR/L Acute Rehabilitation Services Pager: [phone] Office: [phone]   Fredda Hammed 02/05/2019, 11:12 AM

## 2019-02-05 NOTE — Progress Notes (Signed)
Patient had several runs of VT within a two hour period.  RN paged cardiology on call who gave verbal order for BMP to check potassium and magnesium.

## 2019-02-05 NOTE — Care Management Note (Signed)
Case Management Note  Patient Details  Name: Mason Arnold MRN: [id number] Date of Birth: [birth year]  Subjective/Objective:                    Action/Plan:  Spoke w patient at bedside, explained oxygen to be delivered to room prior to DC, he states he has a ride. Entresto and Eliquis listed as home medications therefore cards not given. No other CM needs identified.   Expected Discharge Date:  02/03/19               Expected Discharge Plan:  [place]  In-House Referral:     Discharge planning Services  CM Consult  Post Acute Care Choice:  Durable Medical Equipment Choice offered to:  Patient  DME Arranged:  Oxygen DME Agency:  [place]:    [facility] Agency:     Status of Service:  Completed, signed off  If discussed at [place] of Stay Meetings, dates discussed:    Additional Comments:  Carles Collet, RN 02/05/2019, 8:26 AM

## 2019-02-05 NOTE — Progress Notes (Signed)
Advanced Heart Failure Rounding Note  PCP-Cardiologist: Glori Bickers, MD   Subjective:    Admitted after cath with volume overload and hypoxia.  Continues to diurese on IV lasix. Weight down 11 pounds. Renal function stable. Breathing better. No orthopnea or PND.  K 3.2   R/LHC 02/03/19:  Ost LAD lesion is 100% stenosed.  Prox Cx to Mid Cx lesion is 60% stenosed.  Ost 3rd Mrg lesion is 100% stenosed.  Ost Ramus lesion is 100% stenosed.  Mid RCA lesion is 70% stenosed.  Dist RCA lesion is 100% stenosed.  Origin to Prox Graft lesion is 30% stenosed.  RPDA lesion is 90% stenosed. Findings: RA = 17 RV = 63/17 PA = 70/21 (42) PCW = 27 Fick cardiac output/index = 4.0/2.2 PVR = 3.7 WU FA sat = 97% PA sat = 67%, 68% Assessment: 1. 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  2. We were unable to selectively engage LIMA due to subclavian tortuosity but non selective imaging shows it is widely patent 3. Severe ICM with elevated filling pressures and moderately depressed CO 4. Moderate mixed pulmonary HTN  Objective:   Weight Range: 67.4 kg Body mass index is 24.71 kg/m.   Vital Signs:   Temp:  [98.1 F (36.7 C)-98.9 F (37.2 C)] 98.4 F (36.9 C) (02/29 1142) Pulse Rate:  [66-98] 68 (02/29 1142) Resp:  [18-20] 19 (02/29 1142) BP: (90-107)/(59-80) 93/66 (02/29 1142) SpO2:  [94 %-98 %] 97 % (02/29 1142) Weight:  [67.4 kg] 67.4 kg (02/29 0020) Last BM Date: 02/03/19  Weight change: Filed Weights   02/03/19 0739 02/04/19 0442 02/05/19 0020  Weight: 72.1 kg 68.9 kg 67.4 kg    Intake/Output:   Intake/Output Summary (Last 24 hours) at 02/05/2019 1157 Last data filed at 02/05/2019 1145 Gross per 24 hour  Intake 1196 ml  Output 3725 ml  Net -2529 ml      Physical Exam    General:  Lying in bed  No resp difficulty HEENT: normal Neck: supple. JVP to ear . Carotids 2+ bilat; no bruits. No lymphadenopathy or thryomegaly appreciated. Cor: PMI nondisplaced.  Regular rate & rhythm. No rubs, gallops or murmurs. Lungs: clear Abdomen: soft, nontender, nondistended. No hepatosplenomegaly. No bruits or masses. Good bowel sounds. Extremities: no cyanosis, clubbing, rash, trace edema Neuro: alert & orientedx3, cranial nerves grossly intact. moves all 4 extremities w/o difficulty. Affect pleasant   Telemetry   AV paced 70s. Personally reviewed.   EKG    AV paced 68 bpm. Personally reviewed.   Labs    CBC Recent Labs    02/02/19 1500  02/03/19 1029 02/04/19 0502  WBC 12.2*  --   --  10.2  HGB 15.1   < > 13.6  13.6 13.5  HCT 47.7   < > 40.0  40.0 40.6  MCV 97.0  --   --  92.1  PLT 282  --   --  250   < > = values in this interval not displayed.   Basic Metabolic Panel Recent Labs    02/03/19 0600  02/04/19 2123 02/05/19 0635  NA 133*   < > 133* 135  K 3.2*   < > 3.2* 3.2*  CL 92*   < > 88* 91*  CO2 25   < > 33* 31  GLUCOSE 144*   < > 115* 114*  BUN 32*   < > 30* 26*  CREATININE 1.32*   < > 1.40* 1.25*  CALCIUM 8.5*   < >  8.8* 8.8*  MG 2.4  --  2.2  --    < > = values in this interval not displayed.   Liver Function Tests No results for input(s): AST, ALT, ALKPHOS, BILITOT, PROT, ALBUMIN in the last 72 hours. No results for input(s): LIPASE, AMYLASE in the last 72 hours. Cardiac Enzymes No results for input(s): CKTOTAL, CKMB, CKMBINDEX, TROPONINI in the last 72 hours.  BNP: BNP (last 3 results) Recent Labs    02/02/19 1500  BNP 3,871.3*    ProBNP (last 3 results) No results for input(s): PROBNP in the last 8760 hours.   D-Dimer No results for input(s): DDIMER in the last 72 hours. Hemoglobin A1C No results for input(s): HGBA1C in the last 72 hours. Fasting Lipid Panel No results for input(s): CHOL, HDL, LDLCALC, TRIG, CHOLHDL, LDLDIRECT in the last 72 hours. Thyroid Function Tests No results for input(s): TSH, T4TOTAL, T3FREE, THYROIDAB in the last 72 hours.  Invalid input(s): FREET3  Other  results:   Imaging    Dg Chest Port 1 View  Result Date: 02/05/2019 CLINICAL DATA:  Congestive heart failure EXAM: PORTABLE CHEST 1 VIEW COMPARISON:  03/20/2014 FINDINGS: The heart is mildly enlarged. Left subclavian AICD device and leads are stable. Diffuse interstitial edema is worse. Vascular congestion. No pneumothorax or pleural effusion. IMPRESSION: Mild CHF, now with mild interstitial edema. Electronically Signed   By: Marybelle Killings M.D.   On: 02/05/2019 09:28     Medications:     Scheduled Medications: . allopurinol  100 mg Oral QPM  . apixaban  5 mg Oral BID  . digoxin  0.0625 mg Oral QPM  . famotidine  10 mg Oral QAC supper  . FLUoxetine  20 mg Oral Daily  . furosemide  80 mg Intravenous BID  . insulin aspart  0-5 Units Subcutaneous QHS  . insulin aspart  0-9 Units Subcutaneous TID WC  . mouth rinse  15 mL Mouth Rinse BID  . sacubitril-valsartan  1 tablet Oral BID  . sodium chloride flush  3 mL Intravenous Q12H  . sodium chloride flush  3 mL Intravenous Q12H  . spironolactone  25 mg Oral QPM    Infusions: . sodium chloride Stopped (02/03/19 2026)  . sodium chloride    . sodium chloride      PRN Medications: sodium chloride, sodium chloride, acetaminophen, albuterol, colchicine, loratadine, ondansetron (ZOFRAN) IV, polyvinyl alcohol, sodium chloride flush, sodium chloride flush    Patient Profile   Mason Arnold a 78 y.o.malewith history of CAD s/p CABG 2000, ischemic cardiomyopathy (20%) with St Jude CRT-D, DM2, paroxysmal atrial fibrillation, and recurrent syncope of uncertain etiology.   Admitted post cath with volume overload and increased O2 requirement.   Assessment/Plan   1. Acute on chronic systolic CHF: Ischemic cardiomyopathy s/p St Jude CRT-D, EF 20%(04/2016) - Echo 11/18 EF stable 25-30% - Echo 02/04/19 EF 20% severe RV dysfunction  - CPX 11/18 Peak VO2: 18.5 (76% predicted peak VO2) VE/VCO2 slope: 37 - Diuresing well on 80 mg IV  lasix BID + 5 mg metolazone daily. Weight down 11 pounds. Now below previous baseline - CVP still up but confounded by RV failure. Will continue IV lasix. One more day.  Delene Loll decreased to 24/26 mg BID with low BP.  - Continue spironolactone 25 mg daily - Increase dig back to 0.125 - Hold coreg with volume overload and marginal output.  2. CAD: -With Eliquis so no longer taking aspirin. Can drop dose to 2.5 bid when he  turns 79 - LHC 2/27 showed severe 3V CAD with all grafts patent.  - No s/s ischemia.  3. Paroxysmal atrial fibrillation:  - Continue eliquis 5 mg BID.  4. H/o Recurrent syncope  -Had syncope last week.Myeloma panel pending - BP running on soft side 90-110s. He did not get Entresto last night. Will cut back dose. See above.  5. ICD  - Follows with Dr. Caryl Comes. No change.  6. CKD, III-IV - Creatinine stable 1.25. Follows with diuresis. 7. Dizziness on turning head to right - No carotid bruits or hypersensitvity on exam.  - R ICA ok. L 1-39% on 1/19. No change.  8. DM2 - followed by PCP - consider Jardiance. No change.  9. Hypokalemia - K 3.2. Supp.  10. Acute hypoxic respiratory failure - Still requiring 3 L O2. Evaluate for home O2. Will likely improve with getting fluid off, but would like to have set up if needed in case he goes home over weekend.    Length of Stay: 2  Glori Bickers, MD  02/05/2019, 11:57 AM  Advanced Heart Failure Team Pager [phone] (M-F; 7a - 4p)  Please contact [facility] for night-coverage after hours (4p -7a ) and weekends on [email or web address]

## 2019-02-06 LAB — BASIC METABOLIC PANEL
Anion gap: 12 (ref 5–15)
BUN: 32 mg/dL — ABNORMAL HIGH (ref 8–23)
CO2: 33 mmol/L — ABNORMAL HIGH (ref 22–32)
Calcium: 8.8 mg/dL — ABNORMAL LOW (ref 8.9–10.3)
Chloride: 88 mmol/L — ABNORMAL LOW (ref 98–111)
Creatinine, Ser: 1.36 mg/dL — ABNORMAL HIGH (ref 0.61–1.24)
GFR calc Af Amer: 57 mL/min — ABNORMAL LOW (ref >60)
GFR calc non Af Amer: 49 mL/min — ABNORMAL LOW (ref >60)
Glucose, Bld: 124 mg/dL — ABNORMAL HIGH (ref 70–99)
Potassium: 3.2 mmol/L — ABNORMAL LOW (ref 3.5–5.1)
SODIUM: 133 mmol/L — AB (ref 135–145)

## 2019-02-06 LAB — GLUCOSE, CAPILLARY
Glucose-Capillary: 111 mg/dL — ABNORMAL HIGH (ref 70–99)
Glucose-Capillary: 127 mg/dL — ABNORMAL HIGH (ref 70–99)
Glucose-Capillary: 200 mg/dL — ABNORMAL HIGH (ref 70–99)
Glucose-Capillary: 153 mg/dL — ABNORMAL HIGH (ref 70–99)

## 2019-02-06 MED ORDER — METOLAZONE 2.5 MG PO TABS: ORAL_TABLET | ORAL | 2 refills | Status: DC

## 2019-02-06 MED ORDER — SACUBITRIL-VALSARTAN 24-26 MG PO TABS: 1.0000 | ORAL_TABLET | Freq: Two times a day (BID) | ORAL | 6 refills | Status: DC

## 2019-02-06 MED ORDER — METOLAZONE 5 MG PO TABS: 5.0000 mg | ORAL_TABLET | Freq: Once | ORAL | Status: DC

## 2019-02-06 MED ORDER — DIGOXIN 125 MCG PO TABS: 125.0000 ug | ORAL_TABLET | Freq: Every day | ORAL | 1 refills | Status: AC

## 2019-02-06 MED ORDER — POTASSIUM CHLORIDE CRYS ER 20 MEQ PO TBCR
40.0000 meq | EXTENDED_RELEASE_TABLET | Freq: Two times a day (BID) | ORAL | Status: DC
  Administered 2019-02-06: 40 meq via ORAL
  Filled 2019-02-06: qty 2

## 2019-02-06 MED ORDER — POTASSIUM CHLORIDE CRYS ER 20 MEQ PO TBCR
60.0000 meq | EXTENDED_RELEASE_TABLET | Freq: Once | ORAL | Status: AC
  Administered 2019-02-06: 60 meq via ORAL
  Filled 2019-02-06: qty 3

## 2019-02-06 MED ORDER — POTASSIUM CHLORIDE CRYS ER 20 MEQ PO TBCR: EXTENDED_RELEASE_TABLET | ORAL | 3 refills | Status: DC

## 2019-02-06 MED ORDER — FUROSEMIDE 10 MG/ML IJ SOLN: 80.0000 mg | Freq: Once | INTRAMUSCULAR | Status: DC

## 2019-02-06 MED ORDER — POTASSIUM CHLORIDE CRYS ER 20 MEQ PO TBCR: 40.0000 meq | EXTENDED_RELEASE_TABLET | Freq: Every day | ORAL | Status: DC

## 2019-02-06 NOTE — Plan of Care (Signed)
  Problem: Education: Goal: Knowledge of General Education information will improve Description Including pain rating scale, medication(s)/side effects and non-pharmacologic comfort measures Outcome: Progressing   Problem: Health Behavior/Discharge Planning: Goal: Ability to manage health-related needs will improve Outcome: Progressing   

## 2019-02-06 NOTE — Plan of Care (Signed)
°  Problem: Education: °Goal: Knowledge of General Education information will improve °Description: Including pain rating scale, medication(s)/side effects and non-pharmacologic comfort measures °Outcome: Progressing °  °Problem: Health Behavior/Discharge Planning: °Goal: Ability to manage health-related needs will improve °Outcome: Progressing °  °Problem: Clinical Measurements: °Goal: Ability to maintain clinical measurements within normal limits will improve °Outcome: Progressing °  °Problem: Activity: °Goal: Risk for activity intolerance will decrease °Outcome: Progressing °  °Problem: Nutrition: °Goal: Adequate nutrition will be maintained °Outcome: Progressing °  °Problem: Safety: °Goal: Ability to remain free from injury will improve °Outcome: Progressing °  °

## 2019-02-06 NOTE — Progress Notes (Addendum)
Discharge instructions reviewed with pt by charge nurse Mikle Bosworth). Pt discharged via wheelchair. Pt's family is driving him home. Pt is not in distress. Pharmacy states it is okay for RN to give scheduled 2200 Entresto at this time because [facility] is closed and pt would have to miss tonight's dose.

## 2019-02-06 NOTE — Discharge Summary (Addendum)
Advanced Heart Failure Team  Discharge Summary   Patient ID: Mason Arnold MRN: [id number], DOB/AGE: [birth year] 78 y.o. Admit date: 02/03/2019 D/C date:     02/06/2019   Primary Discharge Diagnoses:  1.Acute on chronic systolic CHF: 2. CAD: 3. Paroxysmal atrial fibrillation:  4. H/o Recurrent syncope   5. CKD, III 6. DM2 7. Hypokalemia  8. Acute hypoxic respiratory failure  Hospital Course:   Mason Arnold a 78 y.o.malewith history of CAD s/p CABG 2000, ischemic cardiomyopathy (20%) with St Jude CRT-D, DM2, paroxysmal atrial fibrillation, and recurrent syncope of uncertain etiology. Was seen in HF Clinic with volume overload and CP. Arranged for outpatient cath on 02/03/19. Cath showed stable CAD with markedly elevated filling pressures. Admitted post cath with volume overload and increased O2 requirement.  Cath results   [facility] 02/03/19:  Ost LAD lesion is 100% stenosed.  Prox Cx to Mid Cx lesion is 60% stenosed.  Ost 3rd Mrg lesion is 100% stenosed.  Ost Ramus lesion is 100% stenosed.  Mid RCA lesion is 70% stenosed.  Dist RCA lesion is 100% stenosed.  Origin to Prox Graft lesion is 30% stenosed.  RPDA lesion is 90% stenosed. Findings: RA = 17 RV = 63/17 PA = 70/21 (42) PCW = 27 Fick cardiac output/index = 4.0/2.2 PVR = 3.7 WU FA sat = 97% PA sat = 67%, 68%  Diuresed well with IV lasix. Weight down to 147.7. Echo done showed EF 20% with severe RV failure. While in hospital also started on home O2 for hypoxia. K replaced regularly. Carvedilol held and Entresto cut back due to low BP. Creatinine stable 1.2-1.3 range.  Will d/c today with close f/u in the HF Clinic. Instructed to reduce fluid intake. Consider Cardiomems.   HF meds for home: Stop carvedilol Digoxin 0.125 Entresto decrease to 24/26 bid Arlyce Harman 25 daily Kdur 20 bid Lasix 80 mg daily Metolazone 2.5 on Wed and Sunday  Take with KDur 40 Eliquis 5 bid   Discharge Vitals: Blood pressure  (!) 86/64, pulse 64, temperature 99 F (37.2 C), temperature source Oral, resp. rate 18, height 5\' 5"  (1.651 m), weight 67 kg, SpO2 98 %. General:  Well appearing. No resp difficulty HEENT: normal Neck: supple. JVP 9-10. Carotids 2+ bilat; no bruits. No lymphadenopathy or thryomegaly appreciated. Cor: PMI laterally displaced. Regular rate & rhythm. No rubs, gallops or murmurs. Lungs: clear Abdomen: soft, nontender, nondistended. No hepatosplenomegaly. No bruits or masses. Good bowel sounds. Extremities: no cyanosis, clubbing, rash, edema Neuro: alert & orientedx3, cranial nerves grossly intact. moves all 4 extremities w/o difficulty. Affect pleasant  Labs: Lab Results  Component Value Date   WBC 10.2 02/04/2019   HGB 13.5 02/04/2019   HCT 40.6 02/04/2019   MCV 92.1 02/04/2019   PLT 250 02/04/2019    Recent Labs  Lab 02/06/19 0343  NA 133*  K 3.2*  CL 88*  CO2 33*  BUN 32*  CREATININE 1.36*  CALCIUM 8.8*  GLUCOSE 124*   Lab Results  Component Value Date   CHOL 276 (H) 06/15/2014   HDL 33.00 (L) 06/15/2014   LDLCALC 172 (H) 06/15/2014   TRIG 353.0 (H) 06/15/2014   BNP (last 3 results) Recent Labs    02/02/19 1500  BNP 3,871.3*    ProBNP (last 3 results) No results for input(s): PROBNP in the last 8760 hours.   Diagnostic Studies/Procedures   Dg Chest Port 1 View  Result Date: 02/05/2019 CLINICAL DATA:  Congestive heart failure EXAM: PORTABLE CHEST 1 VIEW  COMPARISON:  03/20/2014 FINDINGS: The heart is mildly enlarged. Left subclavian AICD device and leads are stable. Diffuse interstitial edema is worse. Vascular congestion. No pneumothorax or pleural effusion. IMPRESSION: Mild CHF, now with mild interstitial edema. Electronically Signed   By: Marybelle Killings M.D.   On: 02/05/2019 09:28    Discharge Medications     Disposition   The patient will be discharged in stable condition to home. Discharge Instructions    Diet - low sodium heart healthy   Complete by:   As directed    Increase activity slowly   Complete by:  As directed      Follow-up Information    Binnie Rail, MD. Go on 02/11/2019.   Specialty:  Internal Medicine Why:  @1 :00pm please arrive @ 12:45pm Contact information: [place] [address] [phone]        [facility] Follow up on 02/16/2019.   Specialty:  Cardiology Why:  Heart Failure f/u 02/16/19 @ 12  -Parking in ER lot (enter under blue awning to left of ER), or underneath [address] in the [place] on [address] (garage code:[id number] , elevator to 1st floor).  -Take all am meds and bring all med bottles  Contact information: [address] [id number] [facility] [place] Follow up.   Why:  For home oxygen, they will bring portable unit to room prior to DC.  Contact information: [address] [address] [phone]             Duration of Discharge Encounter: Greater than 35 minutes   Signed, Leanor Kail  MD 02/06/2019, 2:45 PM

## 2019-02-07 ENCOUNTER — Telehealth: Payer: Self-pay | Admitting: *Deleted

## 2019-02-07 LAB — MULTIPLE MYELOMA PANEL, SERUM
ALBUMIN SERPL ELPH-MCNC: 3.2 g/dL (ref 2.9–4.4)
Albumin/Glob SerPl: 1.1 (ref 0.7–1.7)
Alpha 1: 0.3 g/dL (ref 0.0–0.4)
Alpha2 Glob SerPl Elph-Mcnc: 0.8 g/dL (ref 0.4–1.0)
B-Globulin SerPl Elph-Mcnc: 0.9 g/dL (ref 0.7–1.3)
Gamma Glob SerPl Elph-Mcnc: 1.1 g/dL (ref 0.4–1.8)
Globulin, Total: 3.1 g/dL (ref 2.2–3.9)
IgA: 240 mg/dL (ref 61–437)
IgG (Immunoglobin G), Serum: 1133 mg/dL (ref 700–1600)
IgM (Immunoglobulin M), Srm: 114 mg/dL (ref 15–143)
Total Protein ELP: 6.3 g/dL (ref 6.0–8.5)

## 2019-02-07 NOTE — Telephone Encounter (Signed)
Transition Care Management Follow-up Telephone Call   Date discharged? 02/06/19   How have you been since you were released from the hospital? Spoke w/wife she states pt is doing ok   Do you understand why you were in the hospital? YES   Do you understand the discharge instructions? YES   Where were you discharged to? Home   Items Reviewed:  Medications reviewed: YES  Allergies reviewed: YES  Dietary changes reviewed: YES, heart healthy  Referrals reviewed: YES, will be seeing cardiology on this Thursday 02/10/19   Functional Questionnaire:   Activities of Daily Living (ADLs):   she states she are independent in the following: ambulation, bathing and hygiene, feeding, continence, grooming, toileting and dressing States he doesn'trequire assistance    Any transportation issues/concerns?: NO   Any patient concerns? NO   Confirmed importance and date/time of follow-up visits scheduled YES, wife had made appt earlier for 3/18. Inform wife per d/c summary they wanted him to see PCP within 7-14 days. Move appt to 3/11@ 11:00 which she states would be ok  Provider Appointment booked with Dr. Quay Burow   Confirmed with patient if condition begins to worsen call PCP or go to the ER.  Patient was given the office number and encouraged to call back with question or concerns.  : YES

## 2019-02-08 NOTE — Progress Notes (Signed)
Patient ID: Mason Arnold, male   DOB: [birth year], 78 y.o.   MRN: [id number]   ADVANCED HF CLINIC NOTE PCP: [facility] Primary cardiologist: Dr. Stanford Breed EP: Dr Caryl Comes  Mason Arnold is a 78 y.o. male with history of CAD s/p CABG 2000, ischemic cardiomyopathy (20%) with St Jude CRT-D, DM2, paroxysmal atrial fibrillation, and recurrent syncope of uncertain etiology.    He had RHC/LHC in 4/15 showing patent grafts but elevated filling pressures and low cardiac index (1.9 Fick/2.2 thermo).  Admitted 2/27-02/06/19 after cath with volume overload and hypoxia. Diuresed with IV lasix and transitioned to lasix 80 mg daily + metolazone 2.5 mg 2x/week. Entresto was decreased due to low BPs and dizziness. BB was stopped with marginal output and soft BP. He was set up for new home O2. DC weight 147.7 lbs.   He returns today for post hospital follow up. SOB has improved. He is taking it slowly and does okay. Has not done anything that makes him SOB. Furthest he walks is from house to car. Feeling a little stronger. No further pre-syncopal episodes or dizziness. Good UOP with lasix and metolazone. Did not tolerate torsemide in past. Has some mild abdominal bloating. No BLE edema. Sleeps with HOB elevated around 30 degrees at baseline. No PND. He has a dry cough. Having occasional nosebleeds with oxygen. No dark stools or hematuria. No fever or chills. Appetite is great. Energy level okay. Limiting fluid intake now. Weights at home 147 to 151 lbs. Wearing O2 PRN and at night at home.   ICD interrogated personally: Thoracic impedence at threshold. BiV pacing 98%. No VT/VF. 7.3% AF burden.   R/LHC 02/03/19:  Ost LAD lesion is 100% stenosed.  Prox Cx to Mid Cx lesion is 60% stenosed.  Ost 3rd Mrg lesion is 100% stenosed.  Ost Ramus lesion is 100% stenosed.  Mid RCA lesion is 70% stenosed.  Dist RCA lesion is 100% stenosed.  Origin to Prox Graft lesion is 30% stenosed.  RPDA lesion is  90% stenosed. Findings: RA = 17 RV = 63/17 PA = 70/21 (42) PCW = 27 Fick cardiac output/index = 4.0/2.2 PVR = 3.7 WU FA sat = 97% PA sat = 67%, 68%  CPX 11/18 FVC 2.77 (83%)    FEV1 2.14 (85%)     FEV1/FVC 77 (101%)   MVV 82 (81%) Resting HR: 64 Peak HR: 128  (90% age predicted max HR) BP rest: 100/64 BP peak: 126/60  Peak VO2: 18.5 (76% predicted peak VO2) VE/VCO2 slope: 37  OUES: 1.43 Peak RER: 1.10 Ventilatory Threshold: 14.9 (67% predicted or measured peak VO2) VE/MVV: 74% O2pulse: 12  (100% predicted O2pulse)  Echo 11/18: EF 25-30% ECHO 04/16/2016: EF 20% IVC normal. RV mildly reduced Echo 8/15: EF 15-20% RV moderate dysfunction.   [place] 04/2016 RA = 7 RV = 48/2/8 PA = 54/20 (33) PCW = 19 Fick cardiac output/index = 4.2/2.4 PVR = 3.3 WU Ao sat = 93% PA sat = 57%, 60%  RHC/LHC 2015  RA = 9 RV = 69/0/13 PA = 61/24 (38) PCW = 29 Fick cardiac output/index = 3.4/1.9 Thermo CO/CI = 4.1 /2.2 PVR = 2.7 [place] FA sat = 94% PA sat = 57%, 57% Ao Pressure: 104/58 (77) LV Pressure: 103/13/29 Left main: Mild plaque LAD: Totally occluded ostially LCX: Small ramus with high-grade ostial disease. AV groove LCX appears occluded in mid section. OM-1 occluded proximally. RCA: Unable to be cannulated. Suspect occluded ostially.  LV-gram done in the RAO projection:  Ejection fraction = 15%. Basal inferior and anterior walls only sections that contract.  LIMA to LAD: Patent SVG to Ramus: Patent SVG to OM-3: Patent SVG to R PDA: Patent. The RCA is occluded prior to take-off of PDA. There are collaterals from distal RCA to OM-1 and probably a septal perforator.  PMH: 1. HTN 2. Hyperlipidemia 3. CAD: s/p CABG with LIMA-LAD, SVG-ramus, SVG-OM3, SVG-PDA.  LHC (4/15) with patent grafts.  4. Ischemic cardiomyopathy: Echo (8/14) with EF 20-25% with regional WMAs, mild MR, mildly decreased RV systolic function.  EF LV-gram (4/15) was 15%. He has a [employer] officer, political party CRT-D device  (upgraded 1/14).  RHC (4/15): mean RA 9, PA 61/24 (mean 38), mean PCWP 29, CI 1.9 Fick/2.2 thermo, PVR 2.7. CPX (04/2014): Peak VO2: 19.6 (76% predicted peak VO2), VE/VCO2: 34.8, Peak RER 1.23.  Echo (10/15) with EF 20-25% with regional wall motion abnormalities, normal RV size with mild to moderately decreased systolic function, mild AS, mild MR.  5. Syncopal episodes: Relatively frequent, etiology uncertain.  He does get lightheaded/near-syncopal with coughing spells, but he has syncope at other times also with no warning.  Since device has been in place, he has had VT detected but it has not been clearly correlated with syncopal episodes. Suspect vasomotor.  6. Atrial fibrillation: Paroxysmal.   7. Gout 8. H/o LBBB 9. Type II diabetes 10. CKD  SH: Married, lives in [place], nonsmoker, rare ETOH.   FH: CAD  Review of systems complete and found to be negative unless listed in HPI.   Current Outpatient Medications  Medication Sig Dispense Refill  . albuterol (PROVENTIL HFA;VENTOLIN HFA) 108 (90 Base) MCG/ACT inhaler Inhale 2 puffs into the lungs every 4 (four) hours as needed for wheezing. 1 Inhaler 2  . allopurinol (ZYLOPRIM) 100 MG tablet Take 1 tablet (100 mg total) by mouth daily at 8 pm. (1900) (Patient taking differently: Take 100 mg by mouth every evening. ) 30 tablet 2  . calcium carbonate (OS-CAL) 600 MG TABS Take 600 mg by mouth daily after breakfast.     . Carboxymethylcellulose Sodium (THERATEARS) 0.25 % SOLN Place 1 drop into both eyes 3 (three) times daily as needed (for dry eyes).     . colchicine 0.6 MG tablet Take 0.6 mg by mouth daily as needed (gout flare up).     . digoxin (LANOXIN) 0.125 MG tablet Take 1 tablet (125 mcg total) by mouth daily. 90 tablet 1  . ELIQUIS 5 MG TABS tablet TAKE 1 TABLET BY MOUTH TWICE A DAY (Patient taking differently: Take 2.5 mg by mouth 2 (two) times daily. ) 60 tablet 3  . famotidine (PEPCID) 10 MG tablet Take 10 mg by mouth daily before  supper. 2 HRS BEFORE SUPPER.    Marland Kitchen FLUoxetine (PROZAC) 20 MG capsule Take 1 capsule (20 mg total) by mouth daily. -- Office visit needed for further refills 30 capsule 0  . furosemide (LASIX) 80 MG tablet Take 80 mg by mouth daily.    Marland Kitchen ibuprofen (ADVIL,MOTRIN) 200 MG tablet Take 200-400 mg by mouth every 6 (six) hours as needed for moderate pain.    Marland Kitchen insulin glargine (LANTUS) 100 UNIT/ML injection Inject 10-15 Units into the skin at bedtime as needed (after eating a large meal). (2100)    . loratadine (CLARITIN) 10 MG tablet Take 10 mg by mouth daily as needed for allergies.     . metolazone (ZAROXOLYN) 2.5 MG tablet Take 1 table Wednesday and Sunday by mouth 30 tablet 2  .  Multiple Vitamin (MULTIVITAMIN) tablet Take 1 tablet by mouth daily after breakfast.     . Omega-3 Fatty Acids (FISH OIL) 1000 MG CAPS Take 1,000 mg by mouth daily after breakfast.    . potassium chloride SA (K-DUR,KLOR-CON) 20 MEQ tablet Take one table twice a day. Take additional KDur 40 (2 tables) on day of metolazone Wednesday and sunday 70 tablet 3  . sacubitril-valsartan (ENTRESTO) 24-26 MG Take 1 tablet by mouth 2 (two) times daily. 60 tablet 6  . spironolactone (ALDACTONE) 25 MG tablet Take 25 mg by mouth every evening.      No current facility-administered medications for this encounter.     BP (!) 88/62   Pulse 65   Wt 70.6 kg (155 lb 9.6 oz)   SpO2 91%   BMI 25.89 kg/m  Wt Readings from Last 3 Encounters:  02/10/19 70.6 kg (155 lb 9.6 oz)  02/06/19 67 kg (147 lb 11.2 oz)  02/02/19 72.3 kg (159 lb 6 oz)   General: No resp difficulty. HEENT: Normal Neck: Supple. JVP 6-7. Carotids 2+ bilat; no bruits. No thyromegaly or nodule noted. Cor: PMI nondisplaced. RRR, No M/G/R noted Lungs: CTAB, normal effort. Abdomen: Soft, non-tender, non-distended, no HSM. No bruits or masses. +BS  Extremities: No cyanosis, clubbing, or rash. R and LLE no edema.  Neuro: Alert & orientedx3, cranial nerves grossly intact.  moves all 4 extremities w/o difficulty. Affect pleasant   Assessment/Plan:  1.Chronic systolic CHF: Ischemic cardiomyopathy s/p St Jude CRT-D, EF 20%(04/2016) - Echo 11/18 EF stable 25-30% - Echo 02/04/19 EF 20% severe RV dysfunction  - CPX 11/18 Peak VO2: 18.5 (76% predicted peak VO2) VE/VCO2 slope: 37 - RHC 2/20 with elevated filling pressures, moderate mixed pulmonary HTN, and moderately depressed CO - Myeloma panel normal 02/04/19 - Echo 02/04/19 EF 20-25%, RV severely reduced. Reviewed echo results today.  - NYHA III. Volume stable on exam and on corvue. - Continue lasix 80 mg daily + metolazone 2.5 mg on Wednesday and Sunday. Check BMET today.  - Continue Entresto 24/26 mg BID. Did not tolerate higher dose with hypotension and presyncope. BP still on low side, but no further dizziness. He will monitor at home and let us know if dizziness comes back or if BP drops any lower.  - Continue spironolactone 25mg  daily - Continue digoxin 0.125 mg daily. Check dig level today.  - Hold coreg marginal output and soft BP. BP too soft to resume today.  - Discussed limiting fluid and salt intake today and the importance of daily weights. He will let us know if weight trends up so we can address over the phone.  2. CAD: -With Eliquis so no longer taking aspirin. Can drop dose to 2.5 bid when he turns 79 - LHC 02/03/19 showed severe 3V CAD with all grafts patent. - No s/s ischemia.  3. Paroxysmal atrial fibrillation:  - Continue eliquis 5 mg BID. Having nosebleeds with O2, no other bleeding. Check CBC today.  4. H/o Recurrent syncope  -No further syncope. BP soft today, but no dizziness. He will monitor BP at home and let us know if dizziness reoccurs.  5. ICD  - Follows with Dr. Caryl Comes.No change.  6. CKD, III-IV - BMET today 7. Dizziness on turning head to right - No carotid bruits or hypersensitvity on exam.  - R ICA ok. L 1-39% on 1/19. No change.  8. DM2 - followed by PCP. Sees PCP  Wednesday. Consider Jardiance.  9. Chronic hypoxic respiratory failure -  Now on home oxygen PRN. Will ask advanced to add a humidifier to decrease dry nose.  Keep follow up with Dr Haroldine Laws in 4 weeks. Will cancel echo since he had one inpatient.  CBC, BMET, dig level today  Georgiana Shore NP 02/10/2019  Greater than 50% of the 25 minute visit was spent in counseling/coordination of care regarding disease state education, salt/fluid restriction, sliding scale diuretics, and medication compliance.

## 2019-02-10 ENCOUNTER — Ambulatory Visit (HOSPITAL_COMMUNITY)
Admission: RE | Admit: 2019-02-10 | Discharge: 2019-02-10 | Disposition: A | Discharge status: Home / Self Care | Payer: Medicare Other | Source: Ambulatory Visit | Attending: Internal Medicine | Admitting: Internal Medicine

## 2019-02-10 ENCOUNTER — Telehealth (HOSPITAL_COMMUNITY): Payer: Self-pay | Admitting: Cardiology

## 2019-02-10 VITALS — BP 88/62 | HR 65 | Wt 155.6 lb

## 2019-02-10 DIAGNOSIS — I5022 Chronic systolic (congestive) heart failure: Secondary | ICD-10-CM

## 2019-02-10 DIAGNOSIS — Z8249 Family history of ischemic heart disease and other diseases of the circulatory system: Secondary | ICD-10-CM | POA: Diagnosis not present

## 2019-02-10 DIAGNOSIS — R42 Dizziness and giddiness: Secondary | ICD-10-CM | POA: Insufficient documentation

## 2019-02-10 DIAGNOSIS — E1122 Type 2 diabetes mellitus with diabetic chronic kidney disease: Secondary | ICD-10-CM | POA: Insufficient documentation

## 2019-02-10 DIAGNOSIS — Z7901 Long term (current) use of anticoagulants: Secondary | ICD-10-CM | POA: Diagnosis not present

## 2019-02-10 DIAGNOSIS — I25810 Atherosclerosis of coronary artery bypass graft(s) without angina pectoris: Secondary | ICD-10-CM | POA: Diagnosis not present

## 2019-02-10 DIAGNOSIS — Z79899 Other long term (current) drug therapy: Secondary | ICD-10-CM | POA: Insufficient documentation

## 2019-02-10 DIAGNOSIS — I480 Paroxysmal atrial fibrillation: Secondary | ICD-10-CM

## 2019-02-10 DIAGNOSIS — Z951 Presence of aortocoronary bypass graft: Secondary | ICD-10-CM | POA: Diagnosis not present

## 2019-02-10 DIAGNOSIS — I447 Left bundle-branch block, unspecified: Secondary | ICD-10-CM | POA: Insufficient documentation

## 2019-02-10 DIAGNOSIS — I272 Pulmonary hypertension, unspecified: Secondary | ICD-10-CM | POA: Diagnosis not present

## 2019-02-10 DIAGNOSIS — M109 Gout, unspecified: Secondary | ICD-10-CM | POA: Insufficient documentation

## 2019-02-10 DIAGNOSIS — I255 Ischemic cardiomyopathy: Secondary | ICD-10-CM | POA: Diagnosis not present

## 2019-02-10 DIAGNOSIS — R55 Syncope and collapse: Secondary | ICD-10-CM | POA: Diagnosis not present

## 2019-02-10 DIAGNOSIS — I130 Hypertensive heart and chronic kidney disease with heart failure and stage 1 through stage 4 chronic kidney disease, or unspecified chronic kidney disease: Secondary | ICD-10-CM | POA: Diagnosis not present

## 2019-02-10 DIAGNOSIS — E785 Hyperlipidemia, unspecified: Secondary | ICD-10-CM | POA: Diagnosis not present

## 2019-02-10 DIAGNOSIS — Z9981 Dependence on supplemental oxygen: Secondary | ICD-10-CM

## 2019-02-10 DIAGNOSIS — Z794 Long term (current) use of insulin: Secondary | ICD-10-CM | POA: Insufficient documentation

## 2019-02-10 DIAGNOSIS — N189 Chronic kidney disease, unspecified: Secondary | ICD-10-CM | POA: Diagnosis not present

## 2019-02-10 DIAGNOSIS — N184 Chronic kidney disease, stage 4 (severe): Secondary | ICD-10-CM

## 2019-02-10 DIAGNOSIS — I2510 Atherosclerotic heart disease of native coronary artery without angina pectoris: Secondary | ICD-10-CM

## 2019-02-10 DIAGNOSIS — J9611 Chronic respiratory failure with hypoxia: Secondary | ICD-10-CM

## 2019-02-10 LAB — CBC
HCT: 43.1 % (ref 39.0–52.0)
Hemoglobin: 14 g/dL (ref 13.0–17.0)
MCH: 30.5 pg (ref 26.0–34.0)
MCHC: 32.5 g/dL (ref 30.0–36.0)
MCV: 93.9 fL (ref 80.0–100.0)
Platelets: 246 10*3/uL (ref 150–400)
RBC: 4.59 MIL/uL (ref 4.22–5.81)
RDW: 14.3 % (ref 11.5–15.5)
WBC: 10.1 10*3/uL (ref 4.0–10.5)
nRBC: 0 % (ref 0.0–0.2)

## 2019-02-10 LAB — BASIC METABOLIC PANEL
Anion gap: 13 (ref 5–15)
BUN: 27 mg/dL — ABNORMAL HIGH (ref 8–23)
CO2: 26 mmol/L (ref 22–32)
Calcium: 8.7 mg/dL — ABNORMAL LOW (ref 8.9–10.3)
Chloride: 92 mmol/L — ABNORMAL LOW (ref 98–111)
Creatinine, Ser: 1.06 mg/dL (ref 0.61–1.24)
GFR calc Af Amer: >60 mL/min (ref >60)
GFR calc non Af Amer: >60 mL/min (ref >60)
Glucose, Bld: 164 mg/dL — ABNORMAL HIGH (ref 70–99)
Potassium: 3.2 mmol/L — ABNORMAL LOW (ref 3.5–5.1)
Sodium: 131 mmol/L — ABNORMAL LOW (ref 135–145)

## 2019-02-10 LAB — DIGOXIN LEVEL: Digoxin Level: 0.5 ng/mL — ABNORMAL LOW (ref 0.8–2.0)

## 2019-02-10 MED ORDER — POTASSIUM CHLORIDE CRYS ER 20 MEQ PO TBCR: EXTENDED_RELEASE_TABLET | ORAL | 3 refills | Status: DC

## 2019-02-10 NOTE — Telephone Encounter (Signed)
-----   Message from Georgiana Shore, NP sent at 02/10/2019 12:17 PM EST ----- Mason Arnold is low. Please have him increase daily potassium to 40 meq am, 20 meq in pm. Renal function below his baseline, probably indicating extra volume. Please have him take an additional metolazone tomorrow (with extra 40 meq Mason Arnold). Lets recheck BMET in 1 week. Thanks!

## 2019-02-10 NOTE — Patient Instructions (Signed)
Labs drawn today. If they come back abnormal, we will call you. Otherwise, no news is good news.

## 2019-02-10 NOTE — Telephone Encounter (Signed)
Notes recorded by Kerry Dory, CMA on 02/10/2019 at 4:39 PM EST PT AWARE AND VOICED UNDERSTANDING, REPEAT LABS 3/17 ------  Notes recorded by Georgiana Shore, NP on 02/10/2019 at 12:17 PM EST K is low. Please have him increase daily potassium to 40 meq am, 20 meq in pm. Renal function below his baseline, probably indicating extra volume. Please have him take an additional metolazone tomorrow (with extra 40 meq K). Lets recheck BMET in 1 week. Thanks!

## 2019-02-11 ENCOUNTER — Inpatient Hospital Stay: Payer: Medicare Other | Admitting: Internal Medicine

## 2019-02-11 ENCOUNTER — Other Ambulatory Visit: Payer: Self-pay

## 2019-02-11 NOTE — Patient Outreach (Signed)
[facility]) Care Management  02/11/2019  Jeremi Losito 1941/03/29 [id number]   Medication Adherence call to Mr. Jerimiah Wolman spoke with patient he is due on Jardiance 10 mg he explain he is no longer taking this medication. because insurance did not approve, patient is taking other medications for diabetes. Mr. Seydel is showing past due under [place].   [facility] Management Direct Dial [phone]  Fax [phone] [email or web address]

## 2019-02-13 DIAGNOSIS — I5023 Acute on chronic systolic (congestive) heart failure: Secondary | ICD-10-CM | POA: Insufficient documentation

## 2019-02-13 NOTE — Progress Notes (Signed)
Subjective:    Patient ID: Mason Arnold, male    DOB: 1940/12/17, 78 y.o.   MRN: [id number]  HPI The patient is here for follow up from the hospital.  He is here with his wife.  Admitted 02/03/2019-02/06/2019 for acute on chronic systolic heart failure  He has a history of coronary artery disease status post CABG, ischemic cardiomyopathy with an EF of 20%, diabetes, paroxysmal A. fib and recurrent syncope.  He was seen in the heart failure clinic with volume overloaded and chest pain.  He had an outpatient catheterization on 2/27.  Catheterization showed stable coronary artery disease with markedly elevated filling pressures.  He was admitted post catheterization for volume overload and increased oxygen requirement.  He received IV Lasix and diuresed.  Echocardiogram showed EF 20% with severe RV failure.  He was started on home oxygen for hypoxia.  Potassium replaced.  Carvedilol held and Entresto cut back due to low blood pressure.  Creatinine was stable at 1.2-1.3.  He was discharged home.   HFrEF, Afib, CAD, Hypertension: He is taking his medication daily. He is compliant with a low sodium diet.  He denies chest pain, palpitations, edema.  He did and still has some swelling in the abdominal area-this has improved.Marland Kitchen He is not exercising regularly due to shortness of breath with exertion.  Hypoxia secondary to systolic heart failure: He was started on oxygen while in the hospital.  He is using it while at home most of the time, but is not sure if he needs it all of the time.  He did not bring today.  His oxygen saturation here today is 93% on room air.  After exerting himself he does feel short of breath and the oxygen does seem to help.  Diabetes: He is taking his insulin daily as prescribed.  He will take anywhere from 6 units - 15 units.  On occasion he will not take the insulin because his sugars are on the low side.  He is compliant with a diabetic diet. He is not exercising regularly. He  monitors his sugars and they have been running < 120.    Depression: He is taking his medication daily as prescribed. He denies any side effects from the medication. He feels his depression is well controlled and he is happy with his current dose of medication.    Medications and allergies reviewed with patient and updated if appropriate.  Patient Active Problem List   Diagnosis Date Noted  . Acute on chronic systolic heart failure ([place]) 02/13/2019  . Systolic CHF ([place]) 71/69/6789  . Left ankle pain 11/28/2016  . Left foot pain 11/28/2016  . Right ankle pain 11/28/2016  . Syncope 06/29/2013  . Ventricular tachycardia ([place]) 06/29/2013  . Depression 11/30/2012  . 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 Atrial fibrillation ([place]) 02/03/2011  . Chronic systolic heart failure ([place]) 10/01/2010  . Coronary atherosclerosis 06/20/2010  . Cardiomyopathy, ischemic 06/12/2009  . Asymptomatic hyperuricemia 08/04/2008  . HYPERLIPIDEMIA 02/11/2008  . ORTHOSTATIC HYPOTENSION 02/11/2008  . Diabetes ([place]) 10/18/2007  . GOUT 03/26/2007    Current Outpatient Medications on File Prior to Visit  Medication Sig Dispense Refill  . albuterol (PROVENTIL HFA;VENTOLIN HFA) 108 (90 Base) MCG/ACT inhaler Inhale 2 puffs into the lungs every 4 (four) hours as needed for wheezing. 1 Inhaler 2  . allopurinol (ZYLOPRIM) 100 MG tablet Take 1 tablet (100 mg total) by mouth daily at 8 pm. (1900) (Patient taking differently: Take 100  mg by mouth every evening. ) 30 tablet 2  . calcium carbonate (OS-CAL) 600 MG TABS Take 600 mg by mouth daily after breakfast.     . Carboxymethylcellulose Sodium (THERATEARS) 0.25 % SOLN Place 1 drop into both eyes 3 (three) times daily as needed (for dry eyes).     . colchicine 0.6 MG tablet Take 0.6 mg by mouth daily as needed (gout flare up).     . digoxin (LANOXIN) 0.125 MG tablet Take 1 tablet (125 mcg total) by mouth daily. 90 tablet 1    . ELIQUIS 5 MG TABS tablet TAKE 1 TABLET BY MOUTH TWICE A DAY (Patient taking differently: Take 2.5 mg by mouth 2 (two) times daily. ) 60 tablet 3  . famotidine (PEPCID) 10 MG tablet Take 10 mg by mouth daily before supper. 2 HRS BEFORE SUPPER.    Marland Kitchen FLUoxetine (PROZAC) 20 MG capsule Take 1 capsule (20 mg total) by mouth daily. -- Office visit needed for further refills 30 capsule 0  . furosemide (LASIX) 80 MG tablet Take 80 mg by mouth daily.    Marland Kitchen ibuprofen (ADVIL,MOTRIN) 200 MG tablet Take 200-400 mg by mouth every 6 (six) hours as needed for moderate pain.    Marland Kitchen insulin glargine (LANTUS) 100 UNIT/ML injection Inject 10-15 Units into the skin at bedtime as needed (after eating a large meal). (2100)    . loratadine (CLARITIN) 10 MG tablet Take 10 mg by mouth daily as needed for allergies.     . metolazone (ZAROXOLYN) 2.5 MG tablet Take 1 table Wednesday and Sunday by mouth 30 tablet 2  . Multiple Vitamin (MULTIVITAMIN) tablet Take 1 tablet by mouth daily after breakfast.     . Omega-3 Fatty Acids (FISH OIL) 1000 MG CAPS Take 1,000 mg by mouth daily after breakfast.    . potassium chloride SA (K-DUR,KLOR-CON) 20 MEQ tablet Take 2 tablets (40 mEq total) by mouth every morning AND 1 tablet (20 mEq total) every evening. ADDITONAL 40 MEQ WITH EVERY METOLAZONE DOSE. 120 tablet 3  . sacubitril-valsartan (ENTRESTO) 24-26 MG Take 1 tablet by mouth 2 (two) times daily. 60 tablet 6  . spironolactone (ALDACTONE) 25 MG tablet Take 25 mg by mouth every evening.      No current facility-administered medications on file prior to visit.     Past Medical History:  Diagnosis Date  . AICD (automatic cardioverter/defibrillator) present   . Anginal pain ([place])   . Congestive heart failure ([place])   . Diabetes mellitus type II    IDDM , [facility]  . Dyspnea   . Gout   . Hyperlipidemia   . Hypertension   . Ischemic heart disease   . Left bundle branch block   . Myocardial infarction ([place]) 2000; 2002; 2004; ~ 2006  .  Presence of permanent cardiac pacemaker   . Ventricular tachycardia [facility])     Past Surgical History:  Procedure Laterality Date  . A-V CARDIAC PACEMAKER INSERTION  2002; 2010  . BIV ICD GENERATOR CHANGEOUT N/A 06/15/2017   Procedure: BiV ICD Generator Changeout;  Surgeon: Deboraha Sprang, MD;  Location: [place] CV LAB;  Service: Cardiovascular;  Laterality: N/A;  . BIV ICD GENERTAOR CHANGE OUT N/A 12/16/2012   Procedure: BIV ICD GENERTAOR CHANGE OUT;  Surgeon: Deboraha Sprang, MD;  Location[facility] CATH LAB;  Service: Cardiovascular;  Laterality: N/A;  . CARDIAC CATHETERIZATION     "3 or 4; never had interventions" (12/16/2012)  . CARDIAC CATHETERIZATION N/A 04/11/2016  Procedure: Right Heart Cath;  Surgeon: Jolaine Artist, MD;  Location: [place] CV LAB;  Service: Cardiovascular;  Laterality: N/A;  . CORONARY ARTERY BYPASS GRAFT  2000   CABG X4  . INSERT / REPLACE / REMOVE PACEMAKER  12/16/2012   LV lead placement; ICD assessment   . LEFT AND RIGHT HEART CATHETERIZATION WITH CORONARY/GRAFT ANGIOGRAM N/A 04/04/2014   Procedure: LEFT AND RIGHT HEART CATHETERIZATION WITH Beatrix Fetters;  Surgeon: Jolaine Artist, MD;  Location: [facility] CATH LAB;  Service: Cardiovascular;  Laterality: N/A;  . RIGHT/LEFT HEART CATH AND CORONARY/GRAFT ANGIOGRAPHY N/A 02/03/2019   Procedure: RIGHT/LEFT HEART CATH AND CORONARY/GRAFT ANGIOGRAPHY;  Surgeon: Jolaine Artist, MD;  Location: [place] CV LAB;  Service: Cardiovascular;  Laterality: N/A;  . [place]  . URACHAL CYST EXCISION  ~ 1995  . VASECTOMY  ?20    Social History   Socioeconomic History  . Marital status: Married    Spouse name: Not on file  . Number of children: Not on file  . Years of education: Not on file  . Highest education level: Not on file  Occupational History  . Not on file  Social Needs  . Financial resource strain: Not on file  . Food insecurity:    Worry: Not on file    Inability:  Not on file  . Transportation needs:    Medical: Not on file    Non-medical: Not on file  Tobacco Use  . Smoking status: Never Smoker  . Smokeless tobacco: Former Systems developer    Types: Snuff, Chew  . Tobacco comment: 12/16/2012 "stopped chew/snuff in ~ 1992 after 15 yr"  Substance and Sexual Activity  . Alcohol use: Yes    Alcohol/week: 7.0 standard drinks    Types: 7 Shots of liquor per week    Comment: one shot a day  . Drug use: No    Comment: none  . Sexual activity: Yes  Lifestyle  . Physical activity:    Days per week: Not on file    Minutes per session: Not on file  . Stress: Not on file  Relationships  . Social connections:    Talks on phone: Not on file    Gets together: Not on file    Attends religious service: Not on file    Active member of club or organization: Not on file    Attends meetings of clubs or organizations: Not on file    Relationship status: Not on file  Other Topics Concern  . Not on file  Social History Narrative   Lives with wife in [place].     Family History  Problem Relation Age of Onset  . Heart attack Maternal Grandfather 78  . Heart attack Maternal Grandmother 82  . Heart attack Paternal Grandfather 14  . Heart attack Father 12  . Heart failure Father 53  . Aneurysm Brother   . Diabetes Neg Hx   . Stroke Neg Hx   . Cancer Neg Hx     Review of Systems  Constitutional: Negative for appetite change, chills and fever.  Respiratory: Positive for cough, shortness of breath (with exertion - improved) and wheezing.   Cardiovascular: Negative for chest pain, palpitations and leg swelling.  Gastrointestinal: Positive for anal bleeding (hemorrhoidal) and diarrhea. Negative for abdominal pain.  Endocrine: Positive for cold intolerance.  Neurological: Positive for light-headedness and headaches.       Objective:   Vitals:   02/16/19 1258  BP:  94/60  Pulse: 67  Resp: 16  Temp: 97.6 F (36.4 C)  SpO2: 93%   BP Readings from Last 3  Encounters:  02/16/19 94/60  02/10/19 (!) 88/62  02/06/19 (!) 86/64   Wt Readings from Last 3 Encounters:  02/16/19 156 lb 12.8 oz (71.1 kg)  02/10/19 155 lb 9.6 oz (70.6 kg)  02/06/19 147 lb 11.2 oz (67 kg)   Body mass index is 26.09 kg/m.   Physical Exam    Constitutional: No acute distress. SOB with full sentences. No SOB at rest.   HENT:  Head: Normocephalic and atraumatic.  Neck: Neck supple. No tracheal deviation present. No thyromegaly present.  No cervical lymphadenopathy Cardiovascular: Normal rate, regular rhythm and normal S1, split S2.   2/6 systolic murmur heard. No carotid bruit .  Trace b/l LE edema Pulmonary/Chest: Effort normal and breath sounds normal. No respiratory distress. No has no wheezes. No rales.  Abdomen: soft, NT, ND Skin: Skin is warm and dry. Not diaphoretic.  Psychiatric: Normal mood and affect. Behavior is normal.    LHC, 02/03/2019: severe 3v CAD with all grafts present   DG CHEST PORT 1 VIEW CLINICAL DATA:  Congestive heart failure  EXAM: PORTABLE CHEST 1 VIEW  COMPARISON:  03/20/2014  FINDINGS: The heart is mildly enlarged. Left subclavian AICD device and leads are stable. Diffuse interstitial edema is worse. Vascular congestion. No pneumothorax or pleural effusion.  IMPRESSION: Mild CHF, now with mild interstitial edema.  Electronically Signed   By: Marybelle Killings M.D.   On: 02/05/2019 09:28   Lab Results  Component Value Date   WBC 10.1 02/10/2019   HGB 14.0 02/10/2019   HCT 43.1 02/10/2019   PLT 246 02/10/2019   GLUCOSE 164 (H) 02/10/2019   CHOL 276 (H) 06/15/2014   TRIG 353.0 (H) 06/15/2014   HDL 33.00 (L) 06/15/2014   LDLDIRECT 128.3 03/16/2013   LDLCALC 172 (H) 06/15/2014   ALT 20 04/02/2016   AST 21 04/02/2016   NA 131 (L) 02/10/2019   K 3.2 (L) 02/10/2019   CL 92 (L) 02/10/2019   CREATININE 1.06 02/10/2019   BUN 27 (H) 02/10/2019   CO2 26 02/10/2019   TSH 2.663 09/16/2011   INR 1.5 (H) 02/02/2019    HGBA1C 6.7 08/21/2015   MICROALBUR 0.9 05/21/2010    Assessment & Plan:    See Problem List for Assessment and Plan of chronic medical problems.

## 2019-02-13 NOTE — Patient Instructions (Addendum)
  Medications reviewed and updated.  Changes include :   none  Your prescription(s) have been submitted to your pharmacy. Please take as directed and contact our office if you believe you are having problem(s) with the medication(s).  Let me know if you need a prescription for Jardiance.   Please followup in one year

## 2019-02-14 ENCOUNTER — Ambulatory Visit (INDEPENDENT_AMBULATORY_CARE_PROVIDER_SITE_OTHER): Payer: Medicare Other

## 2019-02-14 DIAGNOSIS — Z95810 Presence of automatic (implantable) cardiac defibrillator: Secondary | ICD-10-CM

## 2019-02-14 DIAGNOSIS — I5022 Chronic systolic (congestive) heart failure: Secondary | ICD-10-CM | POA: Diagnosis not present

## 2019-02-14 NOTE — Progress Notes (Signed)
EPIC Encounter for ICM Monitoring  Patient Name: Mason Arnold is a 78 y.o. male Date: 02/14/2019 Primary Care Physican: Binnie Rail, MD Primary Cardiologist:HF clinic [place] Electrophysiologist: Caryl Comes Nephrologist: [facility] Pacing: 98% LastWeight:147lbs discharge weight 3/1 Today's Weight:  152.6 lbs   AT/AF Burden: 6.4%  Heart Failure questions reviewed, ptreports he has fluid and weight up 5 lbs since hospital discharge. He is taking meds as prescribed and next Metolazone is due 3/5.  Heart cath was 02/03/2019 and admitted post cath for volume overload.     Thoracic impedance abnormal suggesting the return of fluid since 02/12/2019 (after HF clinic 3/5 appt).   Prescribed:Furosemide80 mg take 1 tablet daily. Metolazone 2.5 mg 1 tablet on Wednesday and Sunday. Potassium 20 mEq take 2 tablets (40 mEq total) every morning and 1 tablet (20 mEq total) every evening. Take 40 mEq extra with Metolazone.   Labs: 02/10/2019 Creatinine 1.06, BUN 27, Potassium 3.2, Sodium 131, GFR >60 02/06/2019 Creatinine 1.36, BUN 32, Potassium 3.2, Sodium 133, GFR 49-57  02/05/2019 Creatinine 1.25, BUN 26, Potassium 3.2, Sodium 135, GFR 54->60  02/04/2019 Creatinine 1.40, BUN 30, Potassium 3.2, Sodium 133, GFR 47-55  02/03/2019 Creatinine 1.32, BUN 32, Potassium 3.2, Sodium 133, GFR 51-59  01/26/2019 Creatinine 1.35, BUN 27, Potassium 4.8, Sodium 133, GFR 50-57 12/02/2018 Creatinine 1.77, BUN 30, Potassium 4.9, Sodium 135, GFR 36-42  A complete set of results can be found in Results Review.  Recommendations:Next Metolazone dosage due Wednesday, 3/11.  Advised to limit fluid and salt intake.  Will send copy to Dr Haroldine Laws and will call him back with any recommendations if needed.  Follow-up plan: ICM clinic phone appointment on3/17/2020 to recheck fluid levels.Office appointment scheduled 03/14/2019 with Dr.Bensimhon.   Copy of ICM check  sent to Dr.Klein and Dr Haroldine Laws.   3 month ICM trend: 02/14/2019    1 Year ICM trend:       Rosalene Billings, RN 02/14/2019 11:20 AM

## 2019-02-16 ENCOUNTER — Encounter: Payer: Self-pay | Admitting: Internal Medicine

## 2019-02-16 ENCOUNTER — Ambulatory Visit (INDEPENDENT_AMBULATORY_CARE_PROVIDER_SITE_OTHER): Payer: Medicare Other | Admitting: Internal Medicine

## 2019-02-16 ENCOUNTER — Inpatient Hospital Stay (HOSPITAL_COMMUNITY): Payer: Medicare Other

## 2019-02-16 ENCOUNTER — Other Ambulatory Visit: Payer: Self-pay

## 2019-02-16 VITALS — BP 94/60 | HR 67 | Temp 97.6°F | Resp 16 | Ht 65.0 in | Wt 156.8 lb

## 2019-02-16 DIAGNOSIS — E119 Type 2 diabetes mellitus without complications: Secondary | ICD-10-CM

## 2019-02-16 DIAGNOSIS — I5023 Acute on chronic systolic (congestive) heart failure: Secondary | ICD-10-CM | POA: Diagnosis not present

## 2019-02-16 DIAGNOSIS — Z794 Long term (current) use of insulin: Secondary | ICD-10-CM

## 2019-02-16 DIAGNOSIS — F329 Major depressive disorder, single episode, unspecified: Secondary | ICD-10-CM

## 2019-02-16 DIAGNOSIS — R0902 Hypoxemia: Secondary | ICD-10-CM | POA: Diagnosis not present

## 2019-02-16 DIAGNOSIS — I480 Paroxysmal atrial fibrillation: Secondary | ICD-10-CM

## 2019-02-16 DIAGNOSIS — F32A Depression, unspecified: Secondary | ICD-10-CM

## 2019-02-16 MED ORDER — FLUOXETINE HCL 20 MG PO CAPS: 20.0000 mg | ORAL_CAPSULE | Freq: Every day | ORAL | 1 refills | Status: AC

## 2019-02-16 NOTE — Assessment & Plan Note (Signed)
In sinus rhythm Rate controlled On Eliquis

## 2019-02-16 NOTE — Assessment & Plan Note (Signed)
He is doing better and has already followed up with heart failure clinic Management of medications per them He is doing everything he should be doing He is using oxygen at home and his oxygen saturations at rest have been more than 90.  Advised him to take this with ambulation Continue current medications-has follow-up with heart failure clinic

## 2019-02-16 NOTE — Assessment & Plan Note (Signed)
Depression well controlled Continue Prozac 20 mg daily

## 2019-02-16 NOTE — Assessment & Plan Note (Signed)
Discharged with oxygen to be used at home He uses that at home most of the time, but does not want to take it outside of the house He often does not feel that he needs it-oxygen saturation at home at rest more than 90% Advised him to try oxygen with ambulation, which is when he gets very short of breath Agree with using oxygen as needed Monitor pulse ox at home

## 2019-02-16 NOTE — Assessment & Plan Note (Signed)
Sugars very well controlled at home Will hold off on checking A1c-this will be done at the [place] He is currently taking insulin at night 6 units - 15 units, but there are some nights he does not need it Was on Jardiance for short period of time, but this was continued for unknown reasons-?  Decreased GFR Dr. Haroldine Laws did mention restarting Jardiance and I agree this would be the best alternative and discontinue the insulin He goes to the [place] and back to Dr. Haroldine Laws in the next week and will discuss with both of them-I am willing to prescribe-they will let me know

## 2019-02-17 ENCOUNTER — Ambulatory Visit (HOSPITAL_COMMUNITY)
Admission: RE | Admit: 2019-02-17 | Discharge: 2019-02-17 | Disposition: A | Discharge status: Home / Self Care | Payer: Medicare Other | Source: Ambulatory Visit | Attending: Internal Medicine | Admitting: Internal Medicine

## 2019-02-17 DIAGNOSIS — I5022 Chronic systolic (congestive) heart failure: Secondary | ICD-10-CM | POA: Insufficient documentation

## 2019-02-17 LAB — BASIC METABOLIC PANEL
Anion gap: 12 (ref 5–15)
BUN: 35 mg/dL — ABNORMAL HIGH (ref 8–23)
CO2: 26 mmol/L (ref 22–32)
Calcium: 9.4 mg/dL (ref 8.9–10.3)
Chloride: 97 mmol/L — ABNORMAL LOW (ref 98–111)
Creatinine, Ser: 1.25 mg/dL — ABNORMAL HIGH (ref 0.61–1.24)
GFR calc Af Amer: >60 mL/min (ref >60)
GFR calc non Af Amer: 54 mL/min — ABNORMAL LOW (ref >60)
Glucose, Bld: 136 mg/dL — ABNORMAL HIGH (ref 70–99)
Potassium: 4.4 mmol/L (ref 3.5–5.1)
Sodium: 135 mmol/L (ref 135–145)

## 2019-02-20 ENCOUNTER — Encounter: Payer: Self-pay | Admitting: Internal Medicine

## 2019-02-20 MED ORDER — EMPAGLIFLOZIN 10 MG PO TABS: 10.0000 mg | ORAL_TABLET | Freq: Every day | ORAL | 5 refills | Status: DC

## 2019-02-22 ENCOUNTER — Ambulatory Visit (INDEPENDENT_AMBULATORY_CARE_PROVIDER_SITE_OTHER): Payer: Medicare Other

## 2019-02-22 ENCOUNTER — Other Ambulatory Visit: Payer: Self-pay

## 2019-02-22 DIAGNOSIS — I5022 Chronic systolic (congestive) heart failure: Secondary | ICD-10-CM

## 2019-02-22 DIAGNOSIS — Z95810 Presence of automatic (implantable) cardiac defibrillator: Secondary | ICD-10-CM

## 2019-02-23 ENCOUNTER — Telehealth: Payer: Self-pay

## 2019-02-23 ENCOUNTER — Inpatient Hospital Stay: Payer: Medicare Other | Admitting: Internal Medicine

## 2019-02-23 NOTE — Telephone Encounter (Signed)
Message sent to Lillia Mountain NP that patient declined Palliative services at this time.

## 2019-02-23 NOTE — Progress Notes (Signed)
EPIC Encounter for ICM Monitoring  Patient Name: Mason Arnold is a 78 y.o. male Date: 02/23/2019 Primary Care Physican: Binnie Rail, MD Primary Cardiologist:HF clinic [place] Electrophysiologist: Caryl Comes Nephrologist: [facility] Pacing: 98% 02/06/2019 Weight:147lbs discharge weight 3/1 02/14/2019 Weight:  152.6 lbs 02/23/2019 Weight: 155 lbs  AT/AF Burden:6.4%  Heart Failure questions reviewed, ptreports weight was as high as 156 this week. He took the Metolazone today and had very good urine output.     Thoracic impedanceabnormalsuggesting the return of fluid since 02/12/2019 but trending close to baseline.      Prescribed:Furosemide80 mgtake 1 tablet daily.Metolazone 2.5 mg 1 tablet on Wednesday and Sunday. Potassium 20 mEq take 2 tablets (40 mEq total) every morning and 1 tablet (20 mEq total) every evening. Take 40 mEq extra with Metolazone.   Labs: 02/10/2019 Creatinine 1.06, BUN 27, Potassium 3.2, Sodium 131, GFR >60 02/06/2019 Creatinine 1.36, BUN 32, Potassium 3.2, Sodium 133, GFR 49-57  02/05/2019 Creatinine 1.25, BUN 26, Potassium 3.2, Sodium 135, GFR 54->60  02/04/2019 Creatinine 1.40, BUN 30, Potassium 3.2, Sodium 133, GFR 47-55  02/03/2019 Creatinine 1.32, BUN 32, Potassium 3.2, Sodium 133, GFR 51-59  01/26/2019 Creatinine1.35, BUN27, Potassium4.8, Sodium133, GFR50-57 12/26/2019Creatinine 1.77, BUN30, Potassium4.9, Sodium135, [id number] A complete set of results can be found in Results Review.  Recommendations:Metolazone taken today with good urine output.  Advised to call if weight continues to increase.  Advised to limit fluid and salt intake.    Follow-up plan: ICM clinic phone appointment on3/30/2020.Office appointment scheduled4/05/2019 with Dr.Bensimhon.  Copy of ICM check sent to Dr.Klein and Dr Haroldine Laws.  AT/AF   3 month ICM trend: 02/22/2019    1 Year ICM trend:        Rosalene Billings, RN 02/23/2019 10:42 AM

## 2019-02-23 NOTE — Telephone Encounter (Signed)
Phone call placed to patient to introduce Palliative Care and to offer to schedule visit with NP. Education provided regarding hospice vs palliative. Patient declined services at this time. Patient aware if interested at a later time to speak with his doctor.

## 2019-03-03 ENCOUNTER — Encounter (HOSPITAL_COMMUNITY): Payer: Medicare Other | Admitting: Internal Medicine

## 2019-03-06 DIAGNOSIS — I5023 Acute on chronic systolic (congestive) heart failure: Secondary | ICD-10-CM | POA: Diagnosis not present

## 2019-03-07 ENCOUNTER — Ambulatory Visit (INDEPENDENT_AMBULATORY_CARE_PROVIDER_SITE_OTHER): Payer: Medicare Other

## 2019-03-07 ENCOUNTER — Other Ambulatory Visit: Payer: Self-pay

## 2019-03-07 DIAGNOSIS — Z95810 Presence of automatic (implantable) cardiac defibrillator: Secondary | ICD-10-CM | POA: Diagnosis not present

## 2019-03-07 DIAGNOSIS — I5022 Chronic systolic (congestive) heart failure: Secondary | ICD-10-CM | POA: Diagnosis not present

## 2019-03-09 ENCOUNTER — Telehealth: Payer: Self-pay

## 2019-03-09 NOTE — Progress Notes (Signed)
EPIC Encounter for ICM Monitoring  Patient Name: Mason Arnold is a 78 y.o. male Date: 03/09/2019 Primary Care Physican: Binnie Rail, MD Primary Cardiologist:HF clinic [place] Electrophysiologist: Caryl Comes Nephrologist: [facility] Pacing: 98% 02/06/2019 Weight:147lbs discharge weight 3/1 02/14/2019 Weight:152.6lbs 02/23/2019 Weight: 155 lbs  AT/AF Burden:3.6%  Attempted call to patient and unable to reach.   Transmission reviewed.   Thoracic impedance returned to normal since 02/22/2019 remote transmission.  Prescribed:Furosemide80 mgtake 1 tablet daily.Metolazone 2.5 mg 1 tablet on Wednesday and Sunday. Potassium 20 mEq take 2 tablets (40 mEq total) every morning and 1 tablet (20 mEq total) every evening. Take 40 mEq extra with Metolazone.  Labs: 02/10/2019 Creatinine1.06, BUN27, Potassium3.2, Sodium131, GFR>60 02/06/2019 Creatinine1.36, BUN32, Potassium3.2, [id number], [id number]  02/05/2019 Creatinine1.25, BUN26, Potassium3.2, Sodium135, GFR54->60  02/04/2019 Creatinine1.40, BUN30, Potassium3.2, [id number], [id number]  02/03/2019 Creatinine1.32, BUN32, Potassium3.2, Sodium133, [id number] 01/26/2019 Creatinine1.35, BUN27, Potassium4.8, Sodium133, GFR50-57 12/26/2019Creatinine 1.77, BUN30, Potassium4.9, Sodium135, [id number] A complete set of results can be found in Results Review.  Recommendations:Unable to reach.  Follow-up plan: ICM clinic phone appointment on5/04/2019.Virtual office appointment scheduled4/05/2019 with Dr.Bensimhon.  Copy of ICM check sent to Dr.Kleinand Dr Haroldine Laws.  3 month ICM trend: 03/07/2019    1 Year ICM trend:       Rosalene Billings, RN 03/09/2019 6:04 PM

## 2019-03-09 NOTE — Telephone Encounter (Signed)
Remote ICM transmission received.  Attempted call to patient regarding ICM remote transmission and phone disconnected after several rings.

## 2019-03-14 ENCOUNTER — Ambulatory Visit (HOSPITAL_COMMUNITY): Payer: Medicare Other

## 2019-03-14 ENCOUNTER — Other Ambulatory Visit (HOSPITAL_COMMUNITY): Payer: Medicare Other

## 2019-03-14 ENCOUNTER — Encounter (HOSPITAL_COMMUNITY): Payer: Self-pay | Admitting: *Deleted

## 2019-03-14 ENCOUNTER — Ambulatory Visit (HOSPITAL_COMMUNITY)
Admission: RE | Admit: 2019-03-14 | Discharge: 2019-03-14 | Disposition: A | Discharge status: Home / Self Care | Payer: Medicare Other | Source: Ambulatory Visit | Attending: Internal Medicine | Admitting: Internal Medicine

## 2019-03-14 ENCOUNTER — Other Ambulatory Visit: Payer: Self-pay

## 2019-03-14 DIAGNOSIS — I5022 Chronic systolic (congestive) heart failure: Secondary | ICD-10-CM | POA: Diagnosis not present

## 2019-03-14 DIAGNOSIS — I2510 Atherosclerotic heart disease of native coronary artery without angina pectoris: Secondary | ICD-10-CM

## 2019-03-14 MED ORDER — METOLAZONE 5 MG PO TABS: 5.0000 mg | ORAL_TABLET | ORAL | 2 refills | Status: DC | PRN

## 2019-03-14 MED ORDER — POTASSIUM CHLORIDE CRYS ER 20 MEQ PO TBCR: EXTENDED_RELEASE_TABLET | ORAL | 3 refills | Status: DC

## 2019-03-14 NOTE — Patient Instructions (Addendum)
Prescription for Metolazone 5 mg sent to [facility], take 1 daily for 3 days  Take an extra Potassium 40 meq with Metolazone, new prescription for this sent in as well  Labs sch for Wed 4/8  Please send a download from your defib box on Wed 4/8  Our NP will call you for another appt on Thur 4/9 at 10 am  Please call our office at [phone] or send a message through mychart if you have any questions or concerns.

## 2019-03-14 NOTE — Progress Notes (Addendum)
Orders per Dr Haroldine Laws:  1. Add metolazone 5mg  for 3 days wth 40 kcl extra each day (please call more into his pharmacy)  2. F/u telehealth visit with NP later this week  3. Labs before that visit BMET  4. Have Sharman Cheek do Corevue interrogation on Wednesday please   Spoke w/pt's wife and review orders, new rx sent to [facility] for Metolazone, lab and corvue download sch for Wed 4/8, f/u appt sch for [facility] 4/9 with NP.  AVS sent to patient via mychart

## 2019-03-14 NOTE — Addendum Note (Signed)
Encounter addended by: Scarlette Calico, RN on: 03/14/2019 10:00 AM  Actions taken: Diagnosis association updated, Pharmacy for encounter modified, Order list changed, Clinical Note Signed

## 2019-03-14 NOTE — Progress Notes (Signed)
Heart Failure TeleHealth Note  Due to national recommendations of social distancing due to [place] 19, Audio/video telehealth visit is felt to be most appropriate for this patient at this time.  See MyChart message from today for patient consent regarding telehealth for [facility]  Date:  03/14/2019   ID:  Clements Toro, DOB 09-27-1941, MRN [id number]  Location: Home  Provider location: [address], [place] Type of Visit: Established  PCP:  Binnie Rail, MD  Cardiologist:  No primary care provider on file. Primary HF:  DB  Chief Complaint: HF follow up    History of Present Illness: Mason Arnold is a 78 y.o. male who presents via audio/video conferencing for a telehealth visit today.     Mason Arnold is a 78 y.o. male with history of CAD s/p CABG 2000, ischemic cardiomyopathy (20%) with St Jude CRT-D, DM2, paroxysmal atrial fibrillation, and recurrent syncope of uncertain etiology.    Admitted 2/27-02/06/19 after cath with volume overload and hypoxia. Diuresed with IV lasix and transitioned to lasix 80 mg daily + metolazone 2.5 mg 2x/week. Entresto was decreased due to low BPs and dizziness. BB was stopped with marginal output and soft BP. He was set up for new home O2. DC weight 147.7 lbs.   Says he is feeling pretty good. A few weeks ago his fluid was up on his ICD. Weight up from 151 to 160. No LE edema. + abdominal bloating. + frequent cough. No orthopnea or PND. No CP. Taking lasix 80mg  daily. Taking metolazone 2.5 on Wednesday and Sunday. No problems with ICD. ICD interrogation from 3 days ago with increased fluid. He is cutting fluid back.   He denies symptoms of cough, fevers, chills, or new SOB worrisome for COVID 19.   Past Medical History:  Diagnosis Date   AICD (automatic cardioverter/defibrillator) present    Anginal pain ([place])    Congestive heart failure (HCC)    Diabetes mellitus type II    IDDM , VAH   Dyspnea    Gout     Hyperlipidemia    Hypertension    Ischemic heart disease    Left bundle branch block    Myocardial infarction ([place]) 2000; 2002; 2004; ~ 2006   Presence of permanent cardiac pacemaker    Ventricular tachycardia [facility])    Past Surgical History:  Procedure Laterality Date   A-V CARDIAC PACEMAKER INSERTION  2002; 2010   BIV ICD GENERATOR CHANGEOUT N/A 06/15/2017   Procedure: BiV ICD Generator Changeout;  Surgeon: Deboraha Sprang, MD;  Location: [place] CV LAB;  Service: Cardiovascular;  Laterality: N/A;   BIV ICD GENERTAOR CHANGE OUT N/A 12/16/2012   Procedure: BIV ICD GENERTAOR CHANGE OUT;  Surgeon: Deboraha Sprang, MD;  Location: [facility] CATH LAB;  Service: Cardiovascular;  Laterality: N/A;   CARDIAC CATHETERIZATION     "3 or 4; never had interventions" (12/16/2012)   CARDIAC CATHETERIZATION N/A 04/11/2016   Procedure: Right Heart Cath;  Surgeon: Jolaine Artist, MD;  Location: [place] CV LAB;  Service: Cardiovascular;  Laterality: N/A;   CORONARY ARTERY BYPASS GRAFT  2000   CABG X4   INSERT / REPLACE / REMOVE PACEMAKER  12/16/2012   LV lead placement; ICD assessment    LEFT AND RIGHT HEART CATHETERIZATION WITH CORONARY/GRAFT ANGIOGRAM N/A 04/04/2014   Procedure: LEFT AND RIGHT HEART CATHETERIZATION WITH Beatrix Fetters;  Surgeon: Jolaine Artist, MD;  Location: [facility] CATH LAB;  Service: Cardiovascular;  Laterality: N/A;  RIGHT/LEFT HEART CATH AND CORONARY/GRAFT ANGIOGRAPHY N/A 02/03/2019   Procedure: RIGHT/LEFT HEART CATH AND CORONARY/GRAFT ANGIOGRAPHY;  Surgeon: Jolaine Artist, MD;  Location: [place] CV LAB;  Service: Cardiovascular;  Laterality: N/A;   TONSILLECTOMY AND ADENOIDECTOMY  1954   URACHAL CYST EXCISION  ~ [place]  ?1967     Current Outpatient Medications  Medication Sig Dispense Refill   albuterol (PROVENTIL HFA;VENTOLIN HFA) 108 (90 Base) MCG/ACT inhaler Inhale 2 puffs into the lungs every 4 (four) hours as needed for wheezing. 1  Inhaler 2   allopurinol (ZYLOPRIM) 100 MG tablet Take 1 tablet (100 mg total) by mouth daily at 8 pm. (1900) (Patient taking differently: Take 100 mg by mouth every evening. ) 30 tablet 2   calcium carbonate (OS-CAL) 600 MG TABS Take 600 mg by mouth daily after breakfast.      Carboxymethylcellulose Sodium (THERATEARS) 0.25 % SOLN Place 1 drop into both eyes 3 (three) times daily as needed (for dry eyes).      colchicine 0.6 MG tablet Take 0.6 mg by mouth daily as needed (gout flare up).      digoxin (LANOXIN) 0.125 MG tablet Take 1 tablet (125 mcg total) by mouth daily. 90 tablet 1   ELIQUIS 5 MG TABS tablet TAKE 1 TABLET BY MOUTH TWICE A DAY (Patient taking differently: Take 2.5 mg by mouth 2 (two) times daily. ) 60 tablet 3   empagliflozin (JARDIANCE) 10 MG TABS tablet Take 10 mg by mouth daily. 30 tablet 5   famotidine (PEPCID) 10 MG tablet Take 10 mg by mouth daily before supper. 2 HRS BEFORE SUPPER.     FLUoxetine (PROZAC) 20 MG capsule Take 1 capsule (20 mg total) by mouth daily. 90 capsule 1   furosemide (LASIX) 80 MG tablet Take 80 mg by mouth daily.     ibuprofen (ADVIL,MOTRIN) 200 MG tablet Take 200-400 mg by mouth every 6 (six) hours as needed for moderate pain.     insulin glargine (LANTUS) 100 UNIT/ML injection Inject 10-15 Units into the skin at bedtime as needed (after eating a large meal). (2100)     loratadine (CLARITIN) 10 MG tablet Take 10 mg by mouth daily as needed for allergies.      metolazone (ZAROXOLYN) 2.5 MG tablet Take 1 table Wednesday and Sunday by mouth 30 tablet 2   Multiple Vitamin (MULTIVITAMIN) tablet Take 1 tablet by mouth daily after breakfast.      Omega-3 Fatty Acids (FISH OIL) 1000 MG CAPS Take 1,000 mg by mouth daily after breakfast.     potassium chloride SA (K-DUR,KLOR-CON) 20 MEQ tablet Take 2 tablets (40 mEq total) by mouth every morning AND 1 tablet (20 mEq total) every evening. ADDITONAL 40 MEQ WITH EVERY METOLAZONE DOSE. 120 tablet 3     sacubitril-valsartan (ENTRESTO) 24-26 MG Take 1 tablet by mouth 2 (two) times daily. 60 tablet 6   spironolactone (ALDACTONE) 25 MG tablet Take 25 mg by mouth every evening.      No current facility-administered medications for this encounter.     Allergies:   Ampicillin; Crestor [rosuvastatin calcium]; Orange fruit [citrus]; and Propoxyphene n-acetaminophen   Social History:  The patient  reports that he has never smoked. He has quit using smokeless tobacco.  His smokeless tobacco use included snuff and chew. He reports current alcohol use of about 7.0 standard drinks of alcohol per week. He reports that he does not use drugs.   Family History:  The patient's family  history includes Aneurysm in his brother; Heart attack (age of onset: 25) in his father; Heart attack (age of onset: 51) in his maternal grandfather; Heart attack (age of onset: 73) in his paternal grandfather; Heart attack (age of onset: 10) in his maternal grandmother; Heart failure (age of onset: 40) in his father.   ROS:  Please see the history of present illness.   All other systems are personally reviewed and negative.   Exam:  (Video/Tele Health Call; Exam is subjective and or/visual.) Speaks in full sentences. No SOB Ab bloaeted per wife. Ab bloated  Recent Labs: 02/02/2019: B Natriuretic Peptide 3,871.3 02/04/2019: Magnesium 2.2 02/10/2019: Hemoglobin 14.0; Platelets 246 02/17/2019: BUN 35; Creatinine, Ser 1.25; Potassium 4.4; Sodium 135  Personally reviewed   Wt Readings from Last 3 Encounters:  02/16/19 71.1 kg (156 lb 12.8 oz)  02/10/19 70.6 kg (155 lb 9.6 oz)  02/06/19 67 kg (147 lb 11.2 oz)     ASSESSMENT AND PLAN:  1.Chronic systolic CHF: Ischemic cardiomyopathy s/p St Jude CRT-D, EF 20%(04/2016) - Echo 11/18 EF stable 25-30% - Echo 02/04/19 EF 20% severe RV dysfunction - CPX 11/18 Peak VO2: 18.5 (76% predicted peak VO2) VE/VCO2 slope: 37 - RHC 2/20 with elevated filling pressures, moderate mixed  pulmonary HTN, and moderately depressed CO - Myeloma panel normal 02/04/19 - Echo 02/04/19 EF 20-25%, RV severely reduced. Reviewed echo results today.  - NYHA III. Volume status elevated by his report and on corvue. - Continue lasix 80 mg daily. Take 5mg  metolazone for 3 days with KCl 40 extra - Will arrange another call this week. And will also need labs and a Corevue check. Would like to see weight in low 150s - Continue Entresto 24/26 mg BID. Did not tolerate higher dose with hypotension and presyncope. BP still on low side, but no further dizziness. He will monitor at home and let us know if dizziness comes back or if BP drops any lower.  - Continue spironolactone 25mg  daily -Continue digoxin 0.125 mg daily. Check dig level today.  - Continue Jardiance  - Hold coreg marginal output and soft BP.  - Discussed limiting fluid and salt intake today and the importance of daily weights. He will let us know if weight trends up so we can address over the phone.  2. CAD: -With Eliquis so no longer taking aspirin.  - LHC 02/03/19 showed severe 3V CAD with all grafts patent. - No s/s ischemia.  3. Paroxysmal atrial fibrillation:  - Continue eliquis 5 mg BID. No bleeding - Can drop dose to 2.5 bid when he turns 79  COVID screen The patient does not have any symptoms that suggest any further testing/ screening at this time.  Social distancing reinforced today.  Recommended follow-up:  Later this week.   Today, I have spent 15 minutes with the patient with telehealth technology discussing above.    Signed, Glori Bickers, MD  03/14/2019 8:52 AM  [facility] [address] [facility] and [place] [address] [phone] (office) [phone] (fax)

## 2019-03-16 ENCOUNTER — Ambulatory Visit (HOSPITAL_COMMUNITY)
Admission: RE | Admit: 2019-03-16 | Discharge: 2019-03-16 | Disposition: A | Discharge status: Home / Self Care | Payer: Medicare Other | Source: Ambulatory Visit | Attending: Internal Medicine | Admitting: Internal Medicine

## 2019-03-16 ENCOUNTER — Other Ambulatory Visit: Payer: Self-pay

## 2019-03-16 ENCOUNTER — Encounter (HOSPITAL_COMMUNITY): Payer: Self-pay

## 2019-03-16 DIAGNOSIS — I5022 Chronic systolic (congestive) heart failure: Secondary | ICD-10-CM | POA: Insufficient documentation

## 2019-03-16 LAB — BASIC METABOLIC PANEL
Anion gap: 12 (ref 5–15)
BUN: 35 mg/dL — ABNORMAL HIGH (ref 8–23)
CO2: 28 mmol/L (ref 22–32)
Calcium: 9.3 mg/dL (ref 8.9–10.3)
Chloride: 95 mmol/L — ABNORMAL LOW (ref 98–111)
Creatinine, Ser: 1.63 mg/dL — ABNORMAL HIGH (ref 0.61–1.24)
GFR calc Af Amer: 46 mL/min — ABNORMAL LOW (ref >60)
GFR calc non Af Amer: 39 mL/min — ABNORMAL LOW (ref >60)
Glucose, Bld: 117 mg/dL — ABNORMAL HIGH (ref 70–99)
Potassium: 4.6 mmol/L (ref 3.5–5.1)
Sodium: 135 mmol/L (ref 135–145)

## 2019-03-17 ENCOUNTER — Telehealth (HOSPITAL_COMMUNITY): Payer: Self-pay

## 2019-03-17 ENCOUNTER — Encounter (HOSPITAL_COMMUNITY): Payer: Self-pay

## 2019-03-17 ENCOUNTER — Ambulatory Visit (HOSPITAL_COMMUNITY)
Admission: RE | Admit: 2019-03-17 | Discharge: 2019-03-17 | Disposition: A | Discharge status: Home / Self Care | Payer: Medicare Other | Source: Ambulatory Visit | Attending: Internal Medicine | Admitting: Internal Medicine

## 2019-03-17 VITALS — Wt 161.0 lb

## 2019-03-17 DIAGNOSIS — I255 Ischemic cardiomyopathy: Secondary | ICD-10-CM

## 2019-03-17 DIAGNOSIS — I5023 Acute on chronic systolic (congestive) heart failure: Secondary | ICD-10-CM

## 2019-03-17 DIAGNOSIS — I2510 Atherosclerotic heart disease of native coronary artery without angina pectoris: Secondary | ICD-10-CM | POA: Diagnosis not present

## 2019-03-17 DIAGNOSIS — I5022 Chronic systolic (congestive) heart failure: Secondary | ICD-10-CM

## 2019-03-17 MED ORDER — TORSEMIDE 20 MG PO TABS: 40.0000 mg | ORAL_TABLET | Freq: Two times a day (BID) | ORAL | 0 refills | Status: DC

## 2019-03-17 MED ORDER — METOLAZONE 5 MG PO TABS: 5.0000 mg | ORAL_TABLET | ORAL | 3 refills | Status: DC

## 2019-03-17 NOTE — Addendum Note (Signed)
Encounter addended by: Harvie Junior, CMA on: 03/17/2019 11:36 AM  Actions taken: Visit diagnoses modified, Order list changed, Diagnosis association updated

## 2019-03-17 NOTE — Addendum Note (Signed)
Encounter addended by: Jovita Kussmaul, RN on: 03/17/2019 11:08 AM  Actions taken: Order list changed

## 2019-03-17 NOTE — Telephone Encounter (Signed)
Set up telehealth visit for 13 April @9am  per Amy  Pt verbalized understanding for med changes (d/c lasix & begin torsemide 40mg  BID/continue metolazone 2x week) confirmed correct pharm to send scripts  Informed [facility] would reach out to them for diuretic protocol/red/nurse visit  No further questions

## 2019-03-17 NOTE — Addendum Note (Signed)
Encounter addended by: Scarlette Calico, RN on: 03/17/2019 10:31 AM  Actions taken: Clinical Note Signed

## 2019-03-17 NOTE — Progress Notes (Signed)
Heart Failure TeleHealth Note  Due to national recommendations of social distancing due to [place] 19, Audio/video telehealth visit is felt to be most appropriate for this patient at this time.  See MyChart message from today for patient consent regarding telehealth for [facility].  Date:  03/17/2019   ID:  Mason Arnold, DOB Jun 09, 1941, MRN [id number]  Location: Home  Provider location: [place] [facility] Type of Visit: Established patient, etc  PCP:  Binnie Rail, MD  Cardiologist:  No primary care provider on file. Primary HF: [place]  Chief Complaint:  Heart Failure   History of Present Illness:  [name] is a 78 year old who presents  via audio conferencing for a telehealth visit today.     Mason Whiteheadis a 78 y.o.malewith history of CAD s/p CABG 2000, ischemic cardiomyopathy (20%) with St Jude CRT-D, DM2, paroxysmal atrial fibrillation, and recurrent syncope of uncertain etiology.   Admitted 2/27-02/06/19 after cath with volume overload and hypoxia. Diuresed with IV lasix and transitioned to lasix 80 mg daily + metolazone 2.5 mg 2x/week. Entresto was decreased due to low BPs and dizziness. BB was stopped with marginal output and soft BP. He was set up for new home O2. DC weight 147.7 lbs.  Earlier this week he had a virtual visit with Dr Haroldine Laws.  He was volume overloaded and instructed to take metolazone 5 mg for 3 days. Weight had gone up to 161.   He had lab work on 03/16/19. Creatinine was up from 1.25-->1.6.   Overall feeling fine. Remains SOB with exertion. Denies PND. Sleeps with HOB elevated.  Using 3 liters oxygen daily. Appetite ok. No fever or chills. Weight at home has been unchanged at 161 pounds. Complaining Taking all medications,   He denies symptoms worrisome for COVID 19.   Past Medical History:  Diagnosis Date  . AICD (automatic cardioverter/defibrillator) present   . Anginal pain ([place])   . Congestive heart failure  ([place])   . Diabetes mellitus type II    IDDM , VAH  . Dyspnea   . Gout   . Hyperlipidemia   . Hypertension   . Ischemic heart disease   . Left bundle branch block   . Myocardial infarction ([place]) 2000; 2002; 2004; ~ 2006  . Presence of permanent cardiac pacemaker   . Ventricular tachycardia [facility])    Past Surgical History:  Procedure Laterality Date  . A-V CARDIAC PACEMAKER INSERTION  2002; 2010  . BIV ICD GENERATOR CHANGEOUT N/A 06/15/2017   Procedure: BiV ICD Generator Changeout;  Surgeon: Deboraha Sprang, MD;  Location: [place] CV LAB;  Service: Cardiovascular;  Laterality: N/A;  . BIV ICD GENERTAOR CHANGE OUT N/A 12/16/2012   Procedure: BIV ICD GENERTAOR CHANGE OUT;  Surgeon: Deboraha Sprang, MD;  Location: [facility] CATH LAB;  Service: Cardiovascular;  Laterality: N/A;  . CARDIAC CATHETERIZATION     "3 or 4; never had interventions" (12/16/2012)  . CARDIAC CATHETERIZATION N/A 04/11/2016   Procedure: Right Heart Cath;  Surgeon: Jolaine Artist, MD;  Location: [place] CV LAB;  Service: Cardiovascular;  Laterality: N/A;  . CORONARY ARTERY BYPASS GRAFT  2000   CABG X4  . INSERT / REPLACE / REMOVE PACEMAKER  12/16/2012   LV lead placement; ICD assessment   . LEFT AND RIGHT HEART CATHETERIZATION WITH CORONARY/GRAFT ANGIOGRAM N/A 04/04/2014   Procedure: LEFT AND RIGHT HEART CATHETERIZATION WITH Beatrix Fetters;  Surgeon: Jolaine Artist, MD;  Location: [facility] CATH  LAB;  Service: Cardiovascular;  Laterality: N/A;  . RIGHT/LEFT HEART CATH AND CORONARY/GRAFT ANGIOGRAPHY N/A 02/03/2019   Procedure: RIGHT/LEFT HEART CATH AND CORONARY/GRAFT ANGIOGRAPHY;  Surgeon: Jolaine Artist, MD;  Location: [place] CV LAB;  Service: Cardiovascular;  Laterality: N/A;  . [place]  . URACHAL CYST EXCISION  ~ 1995  . VASECTOMY  ?1967     Current Outpatient Medications  Medication Sig Dispense Refill  . albuterol (PROVENTIL HFA;VENTOLIN HFA) 108 (90 Base) MCG/ACT  inhaler Inhale 2 puffs into the lungs every 4 (four) hours as needed for wheezing. 1 Inhaler 2  . allopurinol (ZYLOPRIM) 100 MG tablet Take 1 tablet (100 mg total) by mouth daily at 8 pm. (1900) (Patient taking differently: Take 100 mg by mouth every evening. ) 30 tablet 2  . calcium carbonate (OS-CAL) 600 MG TABS Take 600 mg by mouth daily after breakfast.     . Carboxymethylcellulose Sodium (THERATEARS) 0.25 % SOLN Place 1 drop into both eyes 3 (three) times daily as needed (for dry eyes).     . colchicine 0.6 MG tablet Take 0.6 mg by mouth daily as needed (gout flare up).     . digoxin (LANOXIN) 0.125 MG tablet Take 1 tablet (125 mcg total) by mouth daily. 90 tablet 1  . ELIQUIS 5 MG TABS tablet TAKE 1 TABLET BY MOUTH TWICE A DAY (Patient taking differently: Take 2.5 mg by mouth 2 (two) times daily. ) 60 tablet 3  . empagliflozin (JARDIANCE) 10 MG TABS tablet Take 10 mg by mouth daily. 30 tablet 5  . famotidine (PEPCID) 10 MG tablet Take 10 mg by mouth daily before supper. 2 HRS BEFORE SUPPER.    Marland Kitchen FLUoxetine (PROZAC) 20 MG capsule Take 1 capsule (20 mg total) by mouth daily. 90 capsule 1  . furosemide (LASIX) 80 MG tablet Take 80 mg by mouth daily.    Marland Kitchen ibuprofen (ADVIL,MOTRIN) 200 MG tablet Take 200-400 mg by mouth every 6 (six) hours as needed for moderate pain.    Marland Kitchen insulin glargine (LANTUS) 100 UNIT/ML injection Inject 10-15 Units into the skin at bedtime as needed (after eating a large meal). (2100)    . loratadine (CLARITIN) 10 MG tablet Take 10 mg by mouth daily as needed for allergies.     . metolazone (ZAROXOLYN) 5 MG tablet Take 1 tablet (5 mg total) by mouth as needed. 10 tablet 2  . Multiple Vitamin (MULTIVITAMIN) tablet Take 1 tablet by mouth daily after breakfast.     . Omega-3 Fatty Acids (FISH OIL) 1000 MG CAPS Take 1,000 mg by mouth daily after breakfast.    . potassium chloride SA (K-DUR,KLOR-CON) 20 MEQ tablet Take 2 tablets (40 mEq total) by mouth every morning AND 1 tablet  (20 mEq total) every evening. ADDITONAL 40 MEQ WITH EVERY METOLAZONE DOSE. 120 tablet 3  . sacubitril-valsartan (ENTRESTO) 24-26 MG Take 1 tablet by mouth 2 (two) times daily. 60 tablet 6  . spironolactone (ALDACTONE) 25 MG tablet Take 25 mg by mouth every evening.      No current facility-administered medications for this encounter.     Allergies:   Ampicillin; Crestor [rosuvastatin calcium]; Orange fruit [citrus]; and Propoxyphene n-acetaminophen   Social History:  The patient  reports that he has never smoked. He has quit using smokeless tobacco.  His smokeless tobacco use included snuff and chew. He reports current alcohol use of about 7.0 standard drinks of alcohol per week. He reports that he does not  use drugs.   Family History:  The patient's family history includes Aneurysm in his brother; Heart attack (age of onset: 49) in his father; Heart attack (age of onset: 38) in his maternal grandfather; Heart attack (age of onset: 42) in his paternal grandfather; Heart attack (age of onset: 49) in his maternal grandmother; Heart failure (age of onset: 44) in his father.   ROS:  Please see the history of present illness.   All other systems are personally reviewed and negative.   Exam:  Tele Health Call; Exam is subjective General:  Speaks in full sentences. Lungs: Mild dyspnea  with conversation. Using 3 liters oxygen  Abdomen: Distended per patient report Extremities: Pt denies edema. Neuro: Alert & oriented x 3.   Recent Labs: 02/02/2019: B Natriuretic Peptide 3,871.3 02/04/2019: Magnesium 2.2 02/10/2019: Hemoglobin 14.0; Platelets 246 03/16/2019: BUN 35; Creatinine, Ser 1.63; Potassium 4.6; Sodium 135  Personally reviewed   Wt Readings from Last 3 Encounters:  03/17/19 73 kg (161 lb)  02/16/19 71.1 kg (156 lb 12.8 oz)  02/10/19 70.6 kg (155 lb 9.6 oz)      ASSESSMENT AND PLAN: 1.Chronic systolic CHF: Ischemic cardiomyopathy s/p St Jude CRT-D, EF 20%(04/2016) - Echo 11/18 EF stable  25-30% - Echo 02/04/19 EF 20% severe RV dysfunction - CPX 11/18 Peak VO2: 18.5 (76% predicted peak VO2) VE/VCO2 slope: 37 - RHC 2/20 with elevated filling pressures, moderate mixed pulmonary HTN, and moderately depressed CO - Myeloma panel normal 02/04/19 - Echo 02/04/19 EF 20-25%, RV severely reduced. Reviewed echo results today. -NYHAIII-IV. Corvue impendance trending up. Volume overloaded despite 3 days of metolazone.  I am going to re-challenge torsemide. For now we will stop lasix and start torsemide 40 mg twice a day and continue metolazone twice a week.  -[facility]cardio-pulmonary program.  I suspect we will need to start IV lasix at home next week. Check BMET on Monday.  -Continue Entresto24/26 mg BID. Did not tolerate higher dose with hypotension and presyncope. -Continue spironolactone 25mg  daily -Continue digoxin0.125 mg daily. Recent dig level was 0.5 fon 02/10/19.  - Continue Jardiance  - Hold coreg marginal outputand soft BP.  2. CAD: -With Eliquis so no longer taking aspirin.  - LHC 2/27/20showed severe 3V CAD with all grafts patent. -No s/s ischemia.  3. Paroxysmal atrial fibrillation:  - Continue eliquis 5 mg BID.No bleeding - Can drop dose to 2.5 bid when he turns 79   COVID screen The patient does not have any symptoms that suggest any further testing/ screening at this time.  Social distancing reinforced today.  Recommended follow-up: Next week. See above.  Relevant cardiac medications were reviewed at length with the patient today.   The patient does not have concerns regarding their medications at this time.   The following changes were made today:  As above  Today, I have spent  25  minutes with the patient with telehealth technology discussing the above issues .  I personally contacted [facility] regarding this complex patient.   Jeanmarie Hubert, NP  03/17/2019 9:08 AM  [place] [address] [phone] (office) [phone] (fax)

## 2019-03-17 NOTE — Patient Instructions (Addendum)
Follow up next week with Amy for virtual visit.   Stop lasix.   Start torsemide 40 mg twice a day. Please take the afternoon dose today.   We will refer [facility] to  [facility] for cardio-pulmonary program.

## 2019-03-19 DIAGNOSIS — Z87891 Personal history of nicotine dependence: Secondary | ICD-10-CM | POA: Diagnosis not present

## 2019-03-19 DIAGNOSIS — I447 Left bundle-branch block, unspecified: Secondary | ICD-10-CM | POA: Diagnosis not present

## 2019-03-19 DIAGNOSIS — I255 Ischemic cardiomyopathy: Secondary | ICD-10-CM | POA: Diagnosis not present

## 2019-03-19 DIAGNOSIS — Z9181 History of falling: Secondary | ICD-10-CM | POA: Diagnosis not present

## 2019-03-19 DIAGNOSIS — I472 Ventricular tachycardia: Secondary | ICD-10-CM | POA: Diagnosis not present

## 2019-03-19 DIAGNOSIS — I480 Paroxysmal atrial fibrillation: Secondary | ICD-10-CM | POA: Diagnosis not present

## 2019-03-19 DIAGNOSIS — I5022 Chronic systolic (congestive) heart failure: Secondary | ICD-10-CM | POA: Diagnosis not present

## 2019-03-19 DIAGNOSIS — Z95810 Presence of automatic (implantable) cardiac defibrillator: Secondary | ICD-10-CM | POA: Diagnosis not present

## 2019-03-19 DIAGNOSIS — M1990 Unspecified osteoarthritis, unspecified site: Secondary | ICD-10-CM | POA: Diagnosis not present

## 2019-03-19 DIAGNOSIS — I252 Old myocardial infarction: Secondary | ICD-10-CM | POA: Diagnosis not present

## 2019-03-19 DIAGNOSIS — I272 Pulmonary hypertension, unspecified: Secondary | ICD-10-CM | POA: Diagnosis not present

## 2019-03-19 DIAGNOSIS — Z9981 Dependence on supplemental oxygen: Secondary | ICD-10-CM | POA: Diagnosis not present

## 2019-03-19 DIAGNOSIS — Z951 Presence of aortocoronary bypass graft: Secondary | ICD-10-CM | POA: Diagnosis not present

## 2019-03-19 DIAGNOSIS — I2510 Atherosclerotic heart disease of native coronary artery without angina pectoris: Secondary | ICD-10-CM | POA: Diagnosis not present

## 2019-03-19 DIAGNOSIS — E785 Hyperlipidemia, unspecified: Secondary | ICD-10-CM | POA: Diagnosis not present

## 2019-03-19 DIAGNOSIS — Z7901 Long term (current) use of anticoagulants: Secondary | ICD-10-CM | POA: Diagnosis not present

## 2019-03-19 DIAGNOSIS — E119 Type 2 diabetes mellitus without complications: Secondary | ICD-10-CM | POA: Diagnosis not present

## 2019-03-19 DIAGNOSIS — Z794 Long term (current) use of insulin: Secondary | ICD-10-CM | POA: Diagnosis not present

## 2019-03-21 ENCOUNTER — Ambulatory Visit (HOSPITAL_COMMUNITY)
Admission: RE | Admit: 2019-03-21 | Discharge: 2019-03-21 | Disposition: A | Discharge status: Home / Self Care | Payer: Medicare Other | Source: Ambulatory Visit | Attending: Internal Medicine | Admitting: Internal Medicine

## 2019-03-21 ENCOUNTER — Encounter (HOSPITAL_COMMUNITY): Payer: Self-pay

## 2019-03-21 ENCOUNTER — Other Ambulatory Visit: Payer: Self-pay

## 2019-03-21 ENCOUNTER — Telehealth: Payer: Self-pay | Admitting: Internal Medicine

## 2019-03-21 ENCOUNTER — Ambulatory Visit (HOSPITAL_COMMUNITY)
Admission: RE | Admit: 2019-03-21 | Discharge: 2019-03-21 | Disposition: A | Discharge status: Home / Self Care | Payer: Medicare Other | Source: Ambulatory Visit | Attending: Cardiology | Admitting: Cardiology

## 2019-03-21 VITALS — BP 100/62 | Wt 156.0 lb

## 2019-03-21 DIAGNOSIS — M109 Gout, unspecified: Secondary | ICD-10-CM | POA: Insufficient documentation

## 2019-03-21 DIAGNOSIS — E785 Hyperlipidemia, unspecified: Secondary | ICD-10-CM | POA: Insufficient documentation

## 2019-03-21 DIAGNOSIS — Z79899 Other long term (current) drug therapy: Secondary | ICD-10-CM | POA: Diagnosis not present

## 2019-03-21 DIAGNOSIS — I255 Ischemic cardiomyopathy: Secondary | ICD-10-CM | POA: Insufficient documentation

## 2019-03-21 DIAGNOSIS — Z95810 Presence of automatic (implantable) cardiac defibrillator: Secondary | ICD-10-CM | POA: Diagnosis not present

## 2019-03-21 DIAGNOSIS — I252 Old myocardial infarction: Secondary | ICD-10-CM | POA: Insufficient documentation

## 2019-03-21 DIAGNOSIS — Z7901 Long term (current) use of anticoagulants: Secondary | ICD-10-CM | POA: Insufficient documentation

## 2019-03-21 DIAGNOSIS — Z794 Long term (current) use of insulin: Secondary | ICD-10-CM | POA: Insufficient documentation

## 2019-03-21 DIAGNOSIS — I5022 Chronic systolic (congestive) heart failure: Secondary | ICD-10-CM

## 2019-03-21 DIAGNOSIS — E119 Type 2 diabetes mellitus without complications: Secondary | ICD-10-CM | POA: Diagnosis not present

## 2019-03-21 DIAGNOSIS — Z951 Presence of aortocoronary bypass graft: Secondary | ICD-10-CM | POA: Insufficient documentation

## 2019-03-21 DIAGNOSIS — I110 Hypertensive heart disease with heart failure: Secondary | ICD-10-CM | POA: Insufficient documentation

## 2019-03-21 DIAGNOSIS — Z8249 Family history of ischemic heart disease and other diseases of the circulatory system: Secondary | ICD-10-CM | POA: Insufficient documentation

## 2019-03-21 DIAGNOSIS — I2510 Atherosclerotic heart disease of native coronary artery without angina pectoris: Secondary | ICD-10-CM | POA: Insufficient documentation

## 2019-03-21 DIAGNOSIS — I272 Pulmonary hypertension, unspecified: Secondary | ICD-10-CM | POA: Insufficient documentation

## 2019-03-21 DIAGNOSIS — I480 Paroxysmal atrial fibrillation: Secondary | ICD-10-CM | POA: Diagnosis not present

## 2019-03-21 LAB — BASIC METABOLIC PANEL
Anion gap: 13 (ref 5–15)
BUN: 45 mg/dL — ABNORMAL HIGH (ref 8–23)
CO2: 29 mmol/L (ref 22–32)
Calcium: 9.1 mg/dL (ref 8.9–10.3)
Chloride: 92 mmol/L — ABNORMAL LOW (ref 98–111)
Creatinine, Ser: 1.79 mg/dL — ABNORMAL HIGH (ref 0.61–1.24)
GFR calc Af Amer: 41 mL/min — ABNORMAL LOW (ref >60)
GFR calc non Af Amer: 35 mL/min — ABNORMAL LOW (ref >60)
Glucose, Bld: 137 mg/dL — ABNORMAL HIGH (ref 70–99)
Potassium: 4 mmol/L (ref 3.5–5.1)
Sodium: 134 mmol/L — ABNORMAL LOW (ref 135–145)

## 2019-03-21 NOTE — Addendum Note (Signed)
Encounter addended by: Valeda Malm, RN on: 03/21/2019 10:08 AM  Actions taken: Clinical Note Signed

## 2019-03-21 NOTE — Progress Notes (Addendum)
Spoke with patient, AVS reviewed. Pt verbalized understanding. Appt made to f/u in 2 weeks for TELEHEALTH visit with NP. AVS mailed

## 2019-03-21 NOTE — Patient Instructions (Addendum)
Continue torsemide 40 mg twice a day .  Continue metolazone 2.5 mg twice a week on Sunday and Wednesday.  Labs today We will only contact you if something comes back abnormal or we need to make some changes. Otherwise no news is good news!  Follow up in 2 weeks for telehealth visit with Amy on:  04/04/19, 11am.

## 2019-03-21 NOTE — Telephone Encounter (Signed)
Copied from [place] [id number]. Topic: Quick Communication - Home Health Verbal Orders >> Mar 21, 2019  2:26 PM R[place]ford Nail, [place] wrote: Caller/Agency: Pam - [facility] Number: [phone] (ask to speak to [facility]) Requesting OT/PT/Skilled Nursing/Social Work/Speech Therapy: nursing Frequency:  2x a week for 4 week  Discharged from hospital on 03/18/2019 for fluid retention and congestive heart failure. Would like to follow for aftercare related to congestive heart failure, diabetes, and med management.

## 2019-03-21 NOTE — Addendum Note (Signed)
Encounter addended by: Valeda Malm, RN on: 03/21/2019 10:06 AM  Actions taken: Clinical Note Signed

## 2019-03-21 NOTE — Addendum Note (Signed)
Encounter addended by: Valeda Malm, RN on: 03/21/2019 12:49 PM  Actions taken: Clinical Note Signed

## 2019-03-21 NOTE — Progress Notes (Signed)
Heart Failure TeleHealth Note  Due to national recommendations of social distancing due to [place] 19, Audio/video telehealth visit is felt to be most appropriate for this patient at this time.  See MyChart message from today for patient consent regarding telehealth for [facility].  Date:  03/21/2019   ID:  Mason Arnold, DOB Feb 08, 1941, MRN [id number]  Location: Home  Provider location: [place] [facility] Type of Visit: Established patient   PCP:  Binnie Rail, MD  Cardiologist:  No primary care provider on file. Primary HF: Dr Haroldine Laws  Chief Complaint: Heart Failure   History of Present Illness: Mason Arnold is a 78 y.o. male with a history of  CAD s/p CABG 2000, ischemic cardiomyopathy (20%) with St Jude CRT-D, DM2, paroxysmal atrial fibrillation, and recurrent syncope of uncertain etiology.   Admitted 2/27-02/06/19 after cath with volume overload and hypoxia. Diuresed with IV lasix and transitioned to lasix 80 mg daily + metolazone 2.5 mg 2x/week. Entresto was decreased due to low BPs and dizziness. BB was stopped with marginal output and soft BP. He was set up for new home O2. DC weight 147.7 lbs.  Last week he was instructed take metolazone 5 mg for 3 days. Due to poor diuretic response he was instructed to stop lasix and start torsemide 40 mg twice a day. He was also referred to [facility]  He presents via audio/video conferencing for a telehealth visit today.  Overall feeling much better. Says he had a better response with torsemide.  No longer short of breath at rest. Mild SOB moving in the house. Remains on 3 liters oxygen.  Denies PND/Orthopnea. Denies abdominal distention. Denies chest pain. Appetite ok. No fever or chills. Weight at home has gone down from 161-->156 pounds.  Taking all medications.  He denies symptoms worrisome for COVID 19.   Past Medical History:  Diagnosis Date   AICD (automatic cardioverter/defibrillator) present    Anginal pain  ([place])    Congestive heart failure (HCC)    Diabetes mellitus type II    IDDM , VAH   Dyspnea    Gout    Hyperlipidemia    Hypertension    Ischemic heart disease    Left bundle branch block    Myocardial infarction ([place]) 2000; 2002; 2004; ~ 2006   Presence of permanent cardiac pacemaker    Ventricular tachycardia [facility])    Past Surgical History:  Procedure Laterality Date   A-V CARDIAC PACEMAKER INSERTION  2002; 2010   BIV ICD GENERATOR CHANGEOUT N/A 06/15/2017   Procedure: BiV ICD Generator Changeout;  Surgeon: Deboraha Sprang, MD;  Location: [place] CV LAB;  Service: Cardiovascular;  Laterality: N/A;   BIV ICD GENERTAOR CHANGE OUT N/A 12/16/2012   Procedure: BIV ICD GENERTAOR CHANGE OUT;  Surgeon: Deboraha Sprang, MD;  Location: [facility] CATH LAB;  Service: Cardiovascular;  Laterality: N/A;   CARDIAC CATHETERIZATION     "3 or 4; never had interventions" (12/16/2012)   CARDIAC CATHETERIZATION N/A 04/11/2016   Procedure: Right Heart Cath;  Surgeon: Jolaine Artist, MD;  Location: [place] CV LAB;  Service: Cardiovascular;  Laterality: N/A;   CORONARY ARTERY BYPASS GRAFT  2000   CABG X4   INSERT / REPLACE / REMOVE PACEMAKER  12/16/2012   LV lead placement; ICD assessment    LEFT AND RIGHT HEART CATHETERIZATION WITH CORONARY/GRAFT ANGIOGRAM N/A 04/04/2014   Procedure: LEFT AND RIGHT HEART CATHETERIZATION WITH Beatrix Fetters;  Surgeon: Jolaine Artist, MD;  Location: [place] CATH LAB;  Service: Cardiovascular;  Laterality: N/A;   RIGHT/LEFT HEART CATH AND CORONARY/GRAFT ANGIOGRAPHY N/A 02/03/2019   Procedure: RIGHT/LEFT HEART CATH AND CORONARY/GRAFT ANGIOGRAPHY;  Surgeon: Jolaine Artist, MD;  Location: [place] CV LAB;  Service: Cardiovascular;  Laterality: N/A;   TONSILLECTOMY AND ADENOIDECTOMY  1954   URACHAL CYST EXCISION  ~ [place]  ?1967     Current Outpatient Medications  Medication Sig Dispense Refill   albuterol (PROVENTIL  HFA;VENTOLIN HFA) 108 (90 Base) MCG/ACT inhaler Inhale 2 puffs into the lungs every 4 (four) hours as needed for wheezing. 1 Inhaler 2   allopurinol (ZYLOPRIM) 100 MG tablet Take 1 tablet (100 mg total) by mouth daily at 8 pm. (1900) (Patient taking differently: Take 100 mg by mouth every evening. ) 30 tablet 2   calcium carbonate (OS-CAL) 600 MG TABS Take 600 mg by mouth daily after breakfast.      Carboxymethylcellulose Sodium (THERATEARS) 0.25 % SOLN Place 1 drop into both eyes 3 (three) times daily as needed (for dry eyes).      colchicine 0.6 MG tablet Take 0.6 mg by mouth daily as needed (gout flare up).      digoxin (LANOXIN) 0.125 MG tablet Take 1 tablet (125 mcg total) by mouth daily. 90 tablet 1   ELIQUIS 5 MG TABS tablet TAKE 1 TABLET BY MOUTH TWICE A DAY (Patient taking differently: Take 2.5 mg by mouth 2 (two) times daily. ) 60 tablet 3   empagliflozin (JARDIANCE) 10 MG TABS tablet Take 10 mg by mouth daily. 30 tablet 5   famotidine (PEPCID) 10 MG tablet Take 10 mg by mouth daily before supper. 2 HRS BEFORE SUPPER.     FLUoxetine (PROZAC) 20 MG capsule Take 1 capsule (20 mg total) by mouth daily. 90 capsule 1   ibuprofen (ADVIL,MOTRIN) 200 MG tablet Take 200-400 mg by mouth every 6 (six) hours as needed for moderate pain.     insulin glargine (LANTUS) 100 UNIT/ML injection Inject 10-15 Units into the skin at bedtime as needed (after eating a large meal). (2100)     loratadine (CLARITIN) 10 MG tablet Take 10 mg by mouth daily as needed for allergies.      metolazone (ZAROXOLYN) 5 MG tablet Take 1 tablet (5 mg total) by mouth 2 (two) times a week. 24 tablet 3   Multiple Vitamin (MULTIVITAMIN) tablet Take 1 tablet by mouth daily after breakfast.      Omega-3 Fatty Acids (FISH OIL) 1000 MG CAPS Take 1,000 mg by mouth daily after breakfast.     potassium chloride SA (K-DUR,KLOR-CON) 20 MEQ tablet Take 2 tablets (40 mEq total) by mouth every morning AND 1 tablet (20 mEq total)  every evening. ADDITONAL 40 MEQ WITH EVERY METOLAZONE DOSE. 120 tablet 3   sacubitril-valsartan (ENTRESTO) 24-26 MG Take 1 tablet by mouth 2 (two) times daily. 60 tablet 6   spironolactone (ALDACTONE) 25 MG tablet Take 25 mg by mouth every evening.      torsemide (DEMADEX) 20 MG tablet Take 2 tablets (40 mg total) by mouth 2 (two) times daily. 180 tablet 0   No current facility-administered medications for this encounter.     Allergies:   Ampicillin; Crestor [rosuvastatin calcium]; Orange fruit [citrus]; and Propoxyphene n-acetaminophen   Social History:  The patient  reports that he has never smoked. He has quit using smokeless tobacco.  His smokeless tobacco use included snuff and chew. He reports current alcohol use of about  7.0 standard drinks of alcohol per week. He reports that he does not use drugs.   Family History:  The patient's family history includes Aneurysm in his brother; Heart attack (age of onset: 59) in his father; Heart attack (age of onset: 63) in his maternal grandfather; Heart attack (age of onset: 40) in his paternal grandfather; Heart attack (age of onset: 41) in his maternal grandmother; Heart failure (age of onset: 69) in his father.   ROS:  Please see the history of present illness.   All other systems are personally reviewed and negative.   Exam: Tele Health Call; Exam is subjective  General:  Speaks in full sentences. No resp difficulty. Lungs: Normal respiratory effort with conversation. On 3 liters oxygen  Abdomen: Non-distended per patient report Extremities: Pt denies edema. Neuro: Alert & oriented x 3.   Recent Labs: 02/02/2019: B Natriuretic Peptide 3,871.3 02/04/2019: Magnesium 2.2 02/10/2019: Hemoglobin 14.0; Platelets 246 03/16/2019: BUN 35; Creatinine, Ser 1.63; Potassium 4.6; Sodium 135  Personally reviewed   Wt Readings from Last 3 Encounters:  03/21/19 70.8 kg (156 lb)  03/17/19 73 kg (161 lb)  02/16/19 71.1 kg (156 lb 12.8 oz)       ASSESSMENT AND PLAN:  1.Chronic systolic CHF: Ischemic cardiomyopathy s/p St Jude CRT-D, EF 20%(04/2016) - Echo 11/18 EF stable 25-30% - Echo 02/04/19 EF 20% severe RV dysfunction - CPX 11/18 Peak VO2: 18.5 (76% predicted peak VO2) VE/VCO2 slope: 37 - RHC 2/20 with elevated filling pressures, moderate mixed pulmonary HTN, and moderately depressed CO - Myeloma panel normal 02/04/19 - Echo 02/04/19 EF 20-25%, RV severely reduced. Reviewed echo results today. -NYHA IIIb which is a return to his baseline. Volume status has improved.  -Plan to continue torsemide 40 mg twice a day with 2.5 mg metolazone twice a week. Check BMET today.   -Continue Entresto24/26 mg BID. Did not tolerate higher dose with hypotension and presyncope. -Continue spironolactone 25mg  daily -Continue digoxin0.125 mg daily. Recent dig level was 0.5 fon 02/10/19.  - Continue Jardiance - Hold coreg marginal outputand soft BP.  2. CAD: -With Eliquis so no longer taking aspirin.  - LHC 2/27/20showed severe 3V CAD with all grafts patent. -no s/s ischemia.  3. Paroxysmal atrial fibrillation:  - Continue eliquis 5 mg BID.No bleeding - Can drop dose to 2.5 bid when he turns 79   COVID screen The patient does not have any symptoms that suggest any further testing/ screening at this time.  Social distancing reinforced today.  Relevant cardiac medications were reviewed at length with the patient today.   The patient does not have concerns regarding their medications at this time.   The following changes were made today: none. Check BMET today.  Recommended follow-up:  2 weeks for tele health visit.   Today, I have spent 15  minutes with the patient with telehealth technology discussing the above issues .    Jeanmarie Hubert, NP  03/21/2019 9:36 AM  [place] [address] [phone] (office) [phone]  (fax)

## 2019-03-22 NOTE — Telephone Encounter (Signed)
He was in the hospital 2/27-3/1 and I saw him 3/11 -- I do not see an admission after that.  He had a couple of virtual visits with cardio.  If he needs a f/u ok - should be able to be virtual, but confirm with him he is the one requesting the f/u.

## 2019-03-22 NOTE — Telephone Encounter (Signed)
Can the follow up be a virtual?

## 2019-03-22 NOTE — Telephone Encounter (Signed)
Spoke with wife to clarify note below. It was a mistake wife clarified that he was last hospitalized in feb per our records and everything was fine. No questions or concerns at this time. Will call us if anything is needed.

## 2019-03-31 ENCOUNTER — Other Ambulatory Visit: Payer: Self-pay

## 2019-03-31 ENCOUNTER — Ambulatory Visit (INDEPENDENT_AMBULATORY_CARE_PROVIDER_SITE_OTHER): Payer: Medicare Other | Admitting: *Deleted

## 2019-03-31 DIAGNOSIS — I255 Ischemic cardiomyopathy: Secondary | ICD-10-CM

## 2019-03-31 LAB — CUP PACEART REMOTE DEVICE CHECK
Battery Remaining Longevity: 52 mo
Battery Remaining Percentage: 70 %
Battery Voltage: 2.96 V
Brady Statistic AP VP Percent: 75 %
Brady Statistic AP VS Percent: 1 %
Brady Statistic AS VP Percent: 23 %
Brady Statistic AS VS Percent: 1 %
Brady Statistic RA Percent Paced: 73 %
Brady Statistic RV Percent Paced: 97 %
Date Time Interrogation Session: 20200423060016
HighPow Impedance: 35 Ohm
HighPow Impedance: 35 Ohm
Implantable Lead Implant Date: 20020513
Implantable Lead Implant Date: 20020513
Implantable Lead Implant Date: 20140109
Implantable Lead Location: 753858
Implantable Lead Location: 753859
Implantable Lead Location: 753860
Implantable Lead Model: 148
Implantable Lead Model: 5076
Implantable Lead Serial Number: 117908
Implantable Pulse Generator Implant Date: 20180709
Lead Channel Impedance Value: 390 Ohm
Lead Channel Impedance Value: 450 Ohm
Lead Channel Impedance Value: 560 Ohm
Lead Channel Pacing Threshold Amplitude: 1.25 V
Lead Channel Pacing Threshold Pulse Width: 0.6 ms
Lead Channel Sensing Intrinsic Amplitude: 11.3 mV
Lead Channel Sensing Intrinsic Amplitude: 3.2 mV
Lead Channel Setting Pacing Amplitude: 2.5 V
Lead Channel Setting Pacing Amplitude: 2.5 V
Lead Channel Setting Pacing Amplitude: 2.5 V
Lead Channel Setting Pacing Pulse Width: 0.6 ms
Lead Channel Setting Pacing Pulse Width: 0.8 ms
Lead Channel Setting Sensing Sensitivity: 0.5 mV
Pulse Gen Serial Number: 7398129

## 2019-04-04 ENCOUNTER — Encounter (HOSPITAL_COMMUNITY): Payer: Self-pay

## 2019-04-04 ENCOUNTER — Other Ambulatory Visit: Payer: Self-pay

## 2019-04-04 ENCOUNTER — Ambulatory Visit (HOSPITAL_COMMUNITY)
Admission: RE | Admit: 2019-04-04 | Discharge: 2019-04-04 | Disposition: A | Discharge status: Home / Self Care | Payer: Medicare Other | Source: Ambulatory Visit | Attending: Internal Medicine | Admitting: Internal Medicine

## 2019-04-04 VITALS — BP 106/66 | HR 71 | Wt 157.0 lb

## 2019-04-04 DIAGNOSIS — I255 Ischemic cardiomyopathy: Secondary | ICD-10-CM

## 2019-04-04 DIAGNOSIS — I5022 Chronic systolic (congestive) heart failure: Secondary | ICD-10-CM

## 2019-04-04 DIAGNOSIS — I480 Paroxysmal atrial fibrillation: Secondary | ICD-10-CM

## 2019-04-04 DIAGNOSIS — I2510 Atherosclerotic heart disease of native coronary artery without angina pectoris: Secondary | ICD-10-CM

## 2019-04-04 NOTE — Patient Instructions (Signed)
Follow up in 3 months with Dr Haroldine Laws. We will check labs during that visit.

## 2019-04-04 NOTE — Progress Notes (Signed)
Heart Failure TeleHealth Note  Due to national recommendations of social distancing due to [place] 19, Audio/video telehealth visit is felt to be most appropriate for this patient at this time.  See MyChart message from today for patient consent regarding telehealth for [facility].  Date:  04/04/2019   ID:  [name], DOB [birth year], MRN [id number]  Location: Home  Provider location: [place] [facility] Type of Visit: Established patient   PCP:  Binnie Rail, MD  Cardiologist:  No primary care provider on file. Primary HF: Dr Haroldine Laws  Chief Complaint: Heart Failure   History of Present Illness: Mason Arnold is a 78 y.o. male with a history of  CAD s/p CABG 2000, ischemic cardiomyopathy (20%) with St Jude CRT-D, DM2, paroxysmal atrial fibrillation, and recurrent syncope of uncertain etiology.   Admitted 2/27-02/06/19 after cath with volume overload and hypoxia. Diuresed with IV lasix and transitioned to lasix 80 mg daily + metolazone 2.5 mg 2x/week. Entresto was decreased due to low BPs and dizziness. BB was stopped with marginal output and soft BP. He was set up for new home O2. DC weight 147.7 lbs.  A few weeks ago was switched to torsemide and has had improved diuresis.  He presents via [employer], civil (consulting) for a telehealth visit today. Overall feeling fine. Mild shortness of breath with exertion. Wearing oxygen most of the time. Denies PND/Orthopnea. Denies chest pain. No bleeding issues.  Appetite ok. No fever or chills. Weight at home 156-157 pounds. Taking all medications.   He denies symptoms worrisome for COVID 19.   Past Medical History:  Diagnosis Date   AICD (automatic cardioverter/defibrillator) present    Anginal pain ([place])    Congestive heart failure (HCC)    Diabetes mellitus type II    IDDM , VAH   Dyspnea    Gout    Hyperlipidemia    Hypertension    Ischemic heart disease    Left bundle branch block     Myocardial infarction ([place]) 2000; 2002; 2004; ~ 2006   Presence of permanent cardiac pacemaker    Ventricular tachycardia [facility])    Past Surgical History:  Procedure Laterality Date   A-V CARDIAC PACEMAKER INSERTION  2002; 2010   BIV ICD GENERATOR CHANGEOUT N/A 06/15/2017   Procedure: BiV ICD Generator Changeout;  Surgeon: Deboraha Sprang, MD;  Location: [place] CV LAB;  Service: Cardiovascular;  Laterality: N/A;   BIV ICD GENERTAOR CHANGE OUT N/A 12/16/2012   Procedure: BIV ICD GENERTAOR CHANGE OUT;  Surgeon: Deboraha Sprang, MD;  Location: [facility] CATH LAB;  Service: Cardiovascular;  Laterality: N/A;   CARDIAC CATHETERIZATION     "3 or 4; never had interventions" (12/16/2012)   CARDIAC CATHETERIZATION N/A 04/11/2016   Procedure: Right Heart Cath;  Surgeon: Jolaine Artist, MD;  Location: [place] CV LAB;  Service: Cardiovascular;  Laterality: N/A;   CORONARY ARTERY BYPASS GRAFT  2000   CABG X4   INSERT / REPLACE / REMOVE PACEMAKER  12/16/2012   LV lead placement; ICD assessment    LEFT AND RIGHT HEART CATHETERIZATION WITH CORONARY/GRAFT ANGIOGRAM N/A 04/04/2014   Procedure: LEFT AND RIGHT HEART CATHETERIZATION WITH Beatrix Fetters;  Surgeon: Jolaine Artist, MD;  Location: [facility] CATH LAB;  Service: Cardiovascular;  Laterality: N/A;   RIGHT/LEFT HEART CATH AND CORONARY/GRAFT ANGIOGRAPHY N/A 02/03/2019   Procedure: RIGHT/LEFT HEART CATH AND CORONARY/GRAFT ANGIOGRAPHY;  Surgeon: Jolaine Artist, MD;  Location: [place] CV LAB;  Service: Cardiovascular;  Laterality: N/A;   TONSILLECTOMY AND ADENOIDECTOMY  1954   URACHAL CYST EXCISION  ~ [place]  ?1967     Current Outpatient Medications  Medication Sig Dispense Refill   albuterol (PROVENTIL HFA;VENTOLIN HFA) 108 (90 Base) MCG/ACT inhaler Inhale 2 puffs into the lungs every 4 (four) hours as needed for wheezing. 1 Inhaler 2   allopurinol (ZYLOPRIM) 100 MG tablet Take 1 tablet (100 mg total) by mouth daily  at 8 pm. (1900) (Patient taking differently: Take 100 mg by mouth every evening. ) 30 tablet 2   calcium carbonate (OS-CAL) 600 MG TABS Take 600 mg by mouth daily after breakfast.      Carboxymethylcellulose Sodium (THERATEARS) 0.25 % SOLN Place 1 drop into both eyes 3 (three) times daily as needed (for dry eyes).      colchicine 0.6 MG tablet Take 0.6 mg by mouth daily as needed (gout flare up).      digoxin (LANOXIN) 0.125 MG tablet Take 1 tablet (125 mcg total) by mouth daily. 90 tablet 1   ELIQUIS 5 MG TABS tablet TAKE 1 TABLET BY MOUTH TWICE A DAY (Patient taking differently: Take 2.5 mg by mouth 2 (two) times daily. ) 60 tablet 3   empagliflozin (JARDIANCE) 10 MG TABS tablet Take 10 mg by mouth daily. 30 tablet 5   famotidine (PEPCID) 10 MG tablet Take 10 mg by mouth daily before supper. 2 HRS BEFORE SUPPER.     FLUoxetine (PROZAC) 20 MG capsule Take 1 capsule (20 mg total) by mouth daily. 90 capsule 1   ibuprofen (ADVIL,MOTRIN) 200 MG tablet Take 200-400 mg by mouth every 6 (six) hours as needed for moderate pain.     insulin glargine (LANTUS) 100 UNIT/ML injection Inject 10-15 Units into the skin at bedtime as needed (after eating a large meal). (2100)     loratadine (CLARITIN) 10 MG tablet Take 10 mg by mouth daily as needed for allergies.      metolazone (ZAROXOLYN) 5 MG tablet Take 1 tablet (5 mg total) by mouth 2 (two) times a week. (Patient taking differently: Take 5 mg by mouth 2 (two) times a week. Sun and Wed) 24 tablet 3   Multiple Vitamin (MULTIVITAMIN) tablet Take 1 tablet by mouth daily after breakfast.      Omega-3 Fatty Acids (FISH OIL) 1000 MG CAPS Take 1,000 mg by mouth daily after breakfast.     potassium chloride SA (K-DUR,KLOR-CON) 20 MEQ tablet Take 2 tablets (40 mEq total) by mouth every morning AND 1 tablet (20 mEq total) every evening. ADDITONAL 40 MEQ WITH EVERY METOLAZONE DOSE. 120 tablet 3   sacubitril-valsartan (ENTRESTO) 24-26 MG Take 1 tablet by  mouth 2 (two) times daily. 60 tablet 6   spironolactone (ALDACTONE) 25 MG tablet Take 25 mg by mouth every evening.      torsemide (DEMADEX) 20 MG tablet Take 2 tablets (40 mg total) by mouth 2 (two) times daily. 180 tablet 0   No current facility-administered medications for this encounter.     Allergies:   Ampicillin; Crestor [rosuvastatin calcium]; Orange fruit [citrus]; and Propoxyphene n-acetaminophen   Social History:  The patient  reports that he has never smoked. He has quit using smokeless tobacco.  His smokeless tobacco use included snuff and chew. He reports current alcohol use of about 7.0 standard drinks of alcohol per week. He reports that he does not use drugs.   Family History:  The patient's family history includes Aneurysm in his brother;  Heart attack (age of onset: 71) in his father; Heart attack (age of onset: 33) in his maternal grandfather; Heart attack (age of onset: 31) in his paternal grandfather; Heart attack (age of onset: 33) in his maternal grandmother; Heart failure (age of onset: 66) in his father.   ROS:  Please see the history of present illness.   All other systems are personally reviewed and negative.   Exam: Tele Health. Exam is subjective Speaks in full sentences.   Lungs: Normal respiratory effort with conversation on 3 liters oxygen Abdomen: non distended per patient Extremities: No edema  Neuro: Alert and oriented x3.   Recent Labs: 02/02/2019: B Natriuretic Peptide 3,871.3 02/04/2019: Magnesium 2.2 02/10/2019: Hemoglobin 14.0; Platelets 246 03/21/2019: BUN 45; Creatinine, Ser 1.79; Potassium 4.0; Sodium 134  Personally reviewed   Wt Readings from Last 3 Encounters:  04/04/19 71.2 kg (157 lb)  03/21/19 70.8 kg (156 lb)  03/17/19 73 kg (161 lb)    Vitals:   04/04/19 1010  BP: 106/66  Pulse: 71    ASSESSMENT AND PLAN:  1.Chronic systolic CHF: Ischemic cardiomyopathy s/p St Jude CRT-D, EF 20%(04/2016) - Echo 11/18 EF stable 25-30% - Echo  02/04/19 EF 20% severe RV dysfunction - CPX 11/18 Peak VO2: 18.5 (76% predicted peak VO2) VE/VCO2 slope: 37 - RHC 2/20 with elevated filling pressures, moderate mixed pulmonary HTN, and moderately depressed CO - Myeloma panel normal 02/04/19 - Echo 02/04/19 EF 20-25%, RV severely reduced. Reviewed echo results today. -NYHA IIIb. Volume status stable.Continue torsemide 40 mg twice a day with 2.5 mg metolazone twice a week. Recent BMET was stable 03/21/19.    -Continue Entresto24/26 mg BID. Did not tolerate higher dose with hypotension and presyncope. -Continue spironolactone 25mg  daily -Continue digoxin0.125 mg daily. Recent dig level was 0.5 fon 02/10/19.  - Continue Jardiance - Hold coreg marginal outputand soft BP.  2. CAD: -With Eliquis so no longer taking aspirin.  - LHC 2/27/20showed severe 3V CAD with all grafts patent. -no s/s ischemia.  3. Paroxysmal atrial fibrillation:  - Continue eliquis 5 mg BID.No bleeding issues.  - Can drop dose to 2.5 bid when he turns 79   COVID screen The patient does not have any symptoms that suggest any further testing/ screening at this time.  Social distancing reinforced today.  Relevant cardiac medications were reviewed at length with the patient today.   The patient does not have concerns regarding their medications at this time.   The following changes were made today: None  Recommended follow-up: 3 months   Today, I have spent 11 minutes with the patient with telehealth technology discussing the above issues .    Jeanmarie Hubert, NP  04/04/2019 10:11 AM  [place] [address] [phone] (office) [phone] (fax)

## 2019-04-06 DIAGNOSIS — I5023 Acute on chronic systolic (congestive) heart failure: Secondary | ICD-10-CM | POA: Diagnosis not present

## 2019-04-08 NOTE — Progress Notes (Signed)
Remote ICD transmission.   

## 2019-04-12 ENCOUNTER — Ambulatory Visit (INDEPENDENT_AMBULATORY_CARE_PROVIDER_SITE_OTHER): Payer: Medicare Other

## 2019-04-12 ENCOUNTER — Other Ambulatory Visit: Payer: Self-pay

## 2019-04-12 DIAGNOSIS — Z95810 Presence of automatic (implantable) cardiac defibrillator: Secondary | ICD-10-CM | POA: Diagnosis not present

## 2019-04-12 DIAGNOSIS — I5022 Chronic systolic (congestive) heart failure: Secondary | ICD-10-CM | POA: Diagnosis not present

## 2019-04-15 NOTE — Progress Notes (Signed)
EPIC Encounter for ICM Monitoring  Patient Name: Mason Arnold is a 78 y.o. male Date: 04/15/2019 Primary Care Physican: Binnie Rail, MD Primary Cardiologist:[facility] [place] Electrophysiologist: Caryl Comes Nephrologist: [facility] Bi-V Pacing: 98% 3/01/2020Weight:147lbs discharge weight 3/1 02/23/2019 Weight:155lbs 03/17/2019 Weight: 161 lbs (per HF clinic notes)  AT/AF Burden:2.5%   Transmission reviewed.   Thoracic impedance trending along baseline normal since 03/16/2019.  Prescribed:Furosemide20 mg take 2 tablets (40 mg total) twice a day.Metolazone 2.5 mg 1 tablet on Wednesday and Sunday. Potassium 20 mEq take 2 tablets (40 mEq total) every morning and 1 tablet (20 mEq total) every evening. Take 40 mEq extra with Metolazone.  Labs: 02/10/2019 Creatinine1.06, BUN27, Potassium3.2, Sodium131, GFR>60 02/06/2019 Creatinine1.36, BUN32, Potassium3.2, [id number], [id number]  02/05/2019 Creatinine1.25, BUN26, Potassium3.2, Sodium135, GFR54->60  02/04/2019 Creatinine1.40, BUN30, Potassium3.2, [id number], [id number]  02/03/2019 Creatinine1.32, BUN32, Potassium3.2, Sodium133, [id number] 01/26/2019 Creatinine1.35, BUN27, Potassium4.8, Sodium133, GFR50-57 12/26/2019Creatinine 1.77, BUN30, Potassium4.9, Sodium135, FGB02-11 A complete set of results can be found in Results Review.  Recommendations:No changes  Follow-up plan: ICM clinic phone appointment on5/04/2019.  Copy of ICM check sent to [place].  AT/AF    3 month ICM trend: 04/12/2019    1 Year ICM trend:       Rosalene Billings, RN 04/15/2019 5:03 PM

## 2019-04-19 ENCOUNTER — Encounter (HOSPITAL_COMMUNITY): Payer: Self-pay

## 2019-04-22 ENCOUNTER — Other Ambulatory Visit: Payer: Self-pay

## 2019-04-22 ENCOUNTER — Encounter (HOSPITAL_COMMUNITY): Payer: Self-pay

## 2019-04-22 ENCOUNTER — Ambulatory Visit (HOSPITAL_COMMUNITY)
Admission: RE | Admit: 2019-04-22 | Discharge: 2019-04-22 | Disposition: A | Discharge status: Home / Self Care | Payer: Medicare Other | Source: Ambulatory Visit | Attending: Cardiology | Admitting: Cardiology

## 2019-04-22 ENCOUNTER — Telehealth (HOSPITAL_COMMUNITY): Payer: Self-pay | Admitting: *Deleted

## 2019-04-22 ENCOUNTER — Telehealth (HOSPITAL_COMMUNITY): Payer: Self-pay

## 2019-04-22 DIAGNOSIS — Z515 Encounter for palliative care: Secondary | ICD-10-CM | POA: Diagnosis not present

## 2019-04-22 DIAGNOSIS — I509 Heart failure, unspecified: Secondary | ICD-10-CM | POA: Diagnosis not present

## 2019-04-22 DIAGNOSIS — Z951 Presence of aortocoronary bypass graft: Secondary | ICD-10-CM | POA: Diagnosis not present

## 2019-04-22 DIAGNOSIS — Z794 Long term (current) use of insulin: Secondary | ICD-10-CM | POA: Diagnosis not present

## 2019-04-22 DIAGNOSIS — N184 Chronic kidney disease, stage 4 (severe): Secondary | ICD-10-CM | POA: Diagnosis not present

## 2019-04-22 DIAGNOSIS — R0602 Shortness of breath: Secondary | ICD-10-CM | POA: Diagnosis not present

## 2019-04-22 DIAGNOSIS — E1122 Type 2 diabetes mellitus with diabetic chronic kidney disease: Secondary | ICD-10-CM | POA: Diagnosis not present

## 2019-04-22 DIAGNOSIS — I480 Paroxysmal atrial fibrillation: Secondary | ICD-10-CM | POA: Diagnosis not present

## 2019-04-22 DIAGNOSIS — Z7901 Long term (current) use of anticoagulants: Secondary | ICD-10-CM | POA: Diagnosis not present

## 2019-04-22 DIAGNOSIS — E785 Hyperlipidemia, unspecified: Secondary | ICD-10-CM | POA: Diagnosis not present

## 2019-04-22 DIAGNOSIS — I272 Pulmonary hypertension, unspecified: Secondary | ICD-10-CM | POA: Diagnosis not present

## 2019-04-22 DIAGNOSIS — I472 Ventricular tachycardia: Secondary | ICD-10-CM | POA: Diagnosis not present

## 2019-04-22 DIAGNOSIS — I5022 Chronic systolic (congestive) heart failure: Secondary | ICD-10-CM | POA: Insufficient documentation

## 2019-04-22 DIAGNOSIS — I083 Combined rheumatic disorders of mitral, aortic and tricuspid valves: Secondary | ICD-10-CM | POA: Diagnosis not present

## 2019-04-22 DIAGNOSIS — Z8249 Family history of ischemic heart disease and other diseases of the circulatory system: Secondary | ICD-10-CM | POA: Diagnosis not present

## 2019-04-22 DIAGNOSIS — I2510 Atherosclerotic heart disease of native coronary artery without angina pectoris: Secondary | ICD-10-CM | POA: Diagnosis not present

## 2019-04-22 DIAGNOSIS — I255 Ischemic cardiomyopathy: Secondary | ICD-10-CM | POA: Diagnosis not present

## 2019-04-22 DIAGNOSIS — I130 Hypertensive heart and chronic kidney disease with heart failure and stage 1 through stage 4 chronic kidney disease, or unspecified chronic kidney disease: Secondary | ICD-10-CM | POA: Diagnosis not present

## 2019-04-22 DIAGNOSIS — Z95810 Presence of automatic (implantable) cardiac defibrillator: Secondary | ICD-10-CM | POA: Diagnosis not present

## 2019-04-22 DIAGNOSIS — I5023 Acute on chronic systolic (congestive) heart failure: Secondary | ICD-10-CM | POA: Diagnosis not present

## 2019-04-22 DIAGNOSIS — Z79899 Other long term (current) drug therapy: Secondary | ICD-10-CM | POA: Diagnosis not present

## 2019-04-22 DIAGNOSIS — Z1159 Encounter for screening for other viral diseases: Secondary | ICD-10-CM | POA: Diagnosis not present

## 2019-04-22 DIAGNOSIS — I5084 End stage heart failure: Secondary | ICD-10-CM | POA: Diagnosis not present

## 2019-04-22 DIAGNOSIS — N179 Acute kidney failure, unspecified: Secondary | ICD-10-CM | POA: Diagnosis not present

## 2019-04-22 DIAGNOSIS — E876 Hypokalemia: Secondary | ICD-10-CM | POA: Diagnosis not present

## 2019-04-22 DIAGNOSIS — I110 Hypertensive heart disease with heart failure: Secondary | ICD-10-CM | POA: Diagnosis not present

## 2019-04-22 DIAGNOSIS — I252 Old myocardial infarction: Secondary | ICD-10-CM | POA: Diagnosis not present

## 2019-04-22 LAB — BASIC METABOLIC PANEL
Anion gap: 16 — ABNORMAL HIGH (ref 5–15)
BUN: 71 mg/dL — ABNORMAL HIGH (ref 8–23)
CO2: 27 mmol/L (ref 22–32)
Calcium: 9.6 mg/dL (ref 8.9–10.3)
Chloride: 87 mmol/L — ABNORMAL LOW (ref 98–111)
Creatinine, Ser: 2.55 mg/dL — ABNORMAL HIGH (ref 0.61–1.24)
GFR calc Af Amer: 27 mL/min — ABNORMAL LOW (ref >60)
GFR calc non Af Amer: 23 mL/min — ABNORMAL LOW (ref >60)
Glucose, Bld: 151 mg/dL — ABNORMAL HIGH (ref 70–99)
Potassium: 4 mmol/L (ref 3.5–5.1)
Sodium: 130 mmol/L — ABNORMAL LOW (ref 135–145)

## 2019-04-22 LAB — BRAIN NATRIURETIC PEPTIDE: B Natriuretic Peptide: >4500 pg/mL — ABNORMAL HIGH (ref 0.0–100.0)

## 2019-04-22 LAB — CBC
HCT: 41.6 % (ref 39.0–52.0)
Hemoglobin: 14 g/dL (ref 13.0–17.0)
MCH: 31.4 pg (ref 26.0–34.0)
MCHC: 33.7 g/dL (ref 30.0–36.0)
MCV: 93.3 fL (ref 80.0–100.0)
Platelets: 168 10*3/uL (ref 150–400)
RBC: 4.46 MIL/uL (ref 4.22–5.81)
RDW: 15.9 % — ABNORMAL HIGH (ref 11.5–15.5)
WBC: 10.1 10*3/uL (ref 4.0–10.5)
nRBC: 0 % (ref 0.0–0.2)

## 2019-04-22 MED ORDER — FUROSEMIDE 10 MG/ML PO SOLN: 80.0000 mg | Freq: Every day | ORAL | 0 refills | Status: DC

## 2019-04-22 NOTE — Telephone Encounter (Signed)
Patient BP 115/78 and heart rate 73. Patient is sending remote transmission. Pt aware to take 40mg  extra torsemide for 3 days.  Pts wife also reports pt has experienced significant bleeding from nose and rectum for the past 5 days.   Message routed to Amy     Does he have a way to check BP and HR? Can you see if he can send in ICD transmission? It doesn't sound like metolazone has worked so well. Have him take additional 40 mg torsemide x 3 days and see if that helps. If no improvement, may need to be seen. Can we set him up for in clinic visit next week?  ----- Message -----  From: Harvie Junior, CMA  Sent: 04/22/2019  9:05 AM EDT  To: Conrad Oakley, NP  Subject: FW: Visit Follow-Up Question             Please advise  ----- Message -----  From: [name]  Sent: 04/22/2019  7:58 AM EDT  To: [facility] Clinical Pool  Subject: Visit Follow-Up Question               Weight wed May 6 155, then in order: 160,161,161,162,158,162,161.2 and today, 162.9. We doubled M pill last week, 6th-the 10th and 13th, 14th per your instructions. Reduced liquids as well. Getting weak, difficult breathing, dizzy and feeling really rough. Productive cough but very strained. No fever but this coming from: I am doing fine/good guy! Yesterday he did state, may need to report to Dr B. Want him to take another M pill this AM? He is so bloated thank you for any feedback

## 2019-04-22 NOTE — Progress Notes (Signed)
Pt in for labs per Lillia Mountain, NP

## 2019-04-22 NOTE — Telephone Encounter (Signed)
He needs CBC, BMET, and BNP today. Anemia may be causing most of his symptoms.

## 2019-04-22 NOTE — Telephone Encounter (Signed)
Orders placed per [facility] np-c. For [facility] to pick up for pt today.

## 2019-04-22 NOTE — Telephone Encounter (Signed)
Pt will come in for labs today. Will schedule telehealth visit or in office visit next week depending on lab results. Per Caryl Pina

## 2019-04-22 NOTE — Patient Instructions (Signed)
Take an extra 40 mg (2 tabs) of Torsemide TODAY AND TOMORROW  If not improving please call our office and speak with on-call doctor over the weekend, if symptoms significantly worsen can report to ER

## 2019-04-23 ENCOUNTER — Encounter (HOSPITAL_COMMUNITY): Payer: Self-pay

## 2019-04-24 ENCOUNTER — Telehealth: Payer: Self-pay | Admitting: Cardiology

## 2019-04-24 ENCOUNTER — Emergency Department (HOSPITAL_COMMUNITY): Payer: Medicare Other

## 2019-04-24 ENCOUNTER — Other Ambulatory Visit: Payer: Self-pay

## 2019-04-24 ENCOUNTER — Encounter (HOSPITAL_COMMUNITY): Payer: Self-pay

## 2019-04-24 ENCOUNTER — Inpatient Hospital Stay (HOSPITAL_COMMUNITY)
Admission: EM | Admit: 2019-04-24 | Discharge: 2019-05-06 | DRG: 291 | Disposition: A | Discharge status: Hospice | Payer: Medicare Other | Attending: Internal Medicine | Admitting: Internal Medicine

## 2019-04-24 DIAGNOSIS — I472 Ventricular tachycardia: Secondary | ICD-10-CM | POA: Diagnosis present

## 2019-04-24 DIAGNOSIS — E1122 Type 2 diabetes mellitus with diabetic chronic kidney disease: Secondary | ICD-10-CM | POA: Diagnosis present

## 2019-04-24 DIAGNOSIS — I272 Pulmonary hypertension, unspecified: Secondary | ICD-10-CM | POA: Diagnosis present

## 2019-04-24 DIAGNOSIS — N184 Chronic kidney disease, stage 4 (severe): Secondary | ICD-10-CM | POA: Diagnosis present

## 2019-04-24 DIAGNOSIS — Z95810 Presence of automatic (implantable) cardiac defibrillator: Secondary | ICD-10-CM | POA: Diagnosis not present

## 2019-04-24 DIAGNOSIS — Z515 Encounter for palliative care: Secondary | ICD-10-CM | POA: Diagnosis present

## 2019-04-24 DIAGNOSIS — N183 Chronic kidney disease, stage 3 (moderate): Secondary | ICD-10-CM

## 2019-04-24 DIAGNOSIS — Z7901 Long term (current) use of anticoagulants: Secondary | ICD-10-CM | POA: Diagnosis not present

## 2019-04-24 DIAGNOSIS — I255 Ischemic cardiomyopathy: Secondary | ICD-10-CM | POA: Diagnosis present

## 2019-04-24 DIAGNOSIS — I509 Heart failure, unspecified: Secondary | ICD-10-CM | POA: Diagnosis not present

## 2019-04-24 DIAGNOSIS — R0602 Shortness of breath: Secondary | ICD-10-CM | POA: Diagnosis present

## 2019-04-24 DIAGNOSIS — N179 Acute kidney failure, unspecified: Secondary | ICD-10-CM | POA: Diagnosis not present

## 2019-04-24 DIAGNOSIS — I5023 Acute on chronic systolic (congestive) heart failure: Secondary | ICD-10-CM | POA: Diagnosis present

## 2019-04-24 DIAGNOSIS — Z8249 Family history of ischemic heart disease and other diseases of the circulatory system: Secondary | ICD-10-CM | POA: Diagnosis not present

## 2019-04-24 DIAGNOSIS — I4891 Unspecified atrial fibrillation: Secondary | ICD-10-CM | POA: Diagnosis not present

## 2019-04-24 DIAGNOSIS — Z951 Presence of aortocoronary bypass graft: Secondary | ICD-10-CM

## 2019-04-24 DIAGNOSIS — E876 Hypokalemia: Secondary | ICD-10-CM | POA: Diagnosis present

## 2019-04-24 DIAGNOSIS — I480 Paroxysmal atrial fibrillation: Secondary | ICD-10-CM | POA: Diagnosis present

## 2019-04-24 DIAGNOSIS — I083 Combined rheumatic disorders of mitral, aortic and tricuspid valves: Secondary | ICD-10-CM | POA: Diagnosis present

## 2019-04-24 DIAGNOSIS — Z79899 Other long term (current) drug therapy: Secondary | ICD-10-CM

## 2019-04-24 DIAGNOSIS — I5084 End stage heart failure: Secondary | ICD-10-CM | POA: Diagnosis present

## 2019-04-24 DIAGNOSIS — Z1159 Encounter for screening for other viral diseases: Secondary | ICD-10-CM

## 2019-04-24 DIAGNOSIS — I252 Old myocardial infarction: Secondary | ICD-10-CM | POA: Diagnosis not present

## 2019-04-24 DIAGNOSIS — R531 Weakness: Secondary | ICD-10-CM

## 2019-04-24 DIAGNOSIS — I130 Hypertensive heart and chronic kidney disease with heart failure and stage 1 through stage 4 chronic kidney disease, or unspecified chronic kidney disease: Principal | ICD-10-CM | POA: Diagnosis present

## 2019-04-24 DIAGNOSIS — Z794 Long term (current) use of insulin: Secondary | ICD-10-CM | POA: Diagnosis not present

## 2019-04-24 DIAGNOSIS — Z7189 Other specified counseling: Secondary | ICD-10-CM

## 2019-04-24 DIAGNOSIS — I110 Hypertensive heart disease with heart failure: Secondary | ICD-10-CM | POA: Diagnosis not present

## 2019-04-24 DIAGNOSIS — E785 Hyperlipidemia, unspecified: Secondary | ICD-10-CM | POA: Diagnosis present

## 2019-04-24 DIAGNOSIS — I2510 Atherosclerotic heart disease of native coronary artery without angina pectoris: Secondary | ICD-10-CM | POA: Diagnosis not present

## 2019-04-24 DIAGNOSIS — I340 Nonrheumatic mitral (valve) insufficiency: Secondary | ICD-10-CM | POA: Diagnosis not present

## 2019-04-24 HISTORY — DX: Atherosclerotic heart disease of native coronary artery without angina pectoris: I25.10

## 2019-04-24 HISTORY — DX: Presence of automatic (implantable) cardiac defibrillator: Z95.810

## 2019-04-24 HISTORY — DX: Acute on chronic combined systolic (congestive) and diastolic (congestive) heart failure: I50.43

## 2019-04-24 LAB — TROPONIN I: Troponin I: 0.06 ng/mL (ref <0.03)

## 2019-04-24 LAB — CBC
HCT: 41.1 % (ref 39.0–52.0)
Hemoglobin: 14.1 g/dL (ref 13.0–17.0)
MCH: 31.9 pg (ref 26.0–34.0)
MCHC: 34.3 g/dL (ref 30.0–36.0)
MCV: 93 fL (ref 80.0–100.0)
Platelets: 167 10*3/uL (ref 150–400)
RBC: 4.42 MIL/uL (ref 4.22–5.81)
RDW: 16 % — ABNORMAL HIGH (ref 11.5–15.5)
WBC: 9.6 10*3/uL (ref 4.0–10.5)
nRBC: 0 % (ref 0.0–0.2)

## 2019-04-24 LAB — BASIC METABOLIC PANEL
Anion gap: 18 — ABNORMAL HIGH (ref 5–15)
BUN: 75 mg/dL — ABNORMAL HIGH (ref 8–23)
CO2: 27 mmol/L (ref 22–32)
Calcium: 9.5 mg/dL (ref 8.9–10.3)
Chloride: 86 mmol/L — ABNORMAL LOW (ref 98–111)
Creatinine, Ser: 2.32 mg/dL — ABNORMAL HIGH (ref 0.61–1.24)
GFR calc Af Amer: 30 mL/min — ABNORMAL LOW (ref >60)
GFR calc non Af Amer: 26 mL/min — ABNORMAL LOW (ref >60)
Glucose, Bld: 144 mg/dL — ABNORMAL HIGH (ref 70–99)
Potassium: 3.9 mmol/L (ref 3.5–5.1)
Sodium: 131 mmol/L — ABNORMAL LOW (ref 135–145)

## 2019-04-24 LAB — DIGOXIN LEVEL: Digoxin Level: 0.9 ng/mL (ref 0.8–2.0)

## 2019-04-24 LAB — GLUCOSE, CAPILLARY
Glucose-Capillary: 87 mg/dL (ref 70–99)
Glucose-Capillary: 186 mg/dL — ABNORMAL HIGH (ref 70–99)

## 2019-04-24 LAB — MAGNESIUM: Magnesium: 2.9 mg/dL — ABNORMAL HIGH (ref 1.7–2.4)

## 2019-04-24 LAB — SARS CORONAVIRUS 2 BY RT PCR (HOSPITAL ORDER, PERFORMED IN ~~LOC~~ HOSPITAL LAB): SARS Coronavirus 2: NEGATIVE

## 2019-04-24 LAB — BRAIN NATRIURETIC PEPTIDE: B Natriuretic Peptide: 3264.2 pg/mL — ABNORMAL HIGH (ref 0.0–100.0)

## 2019-04-24 MED ORDER — SPIRONOLACTONE 25 MG PO TABS
25.0000 mg | ORAL_TABLET | Freq: Every evening | ORAL | Status: DC
  Administered 2019-04-24 – 2019-05-05 (×12): 25 mg via ORAL
  Filled 2019-04-24 (×12): qty 1

## 2019-04-24 MED ORDER — SODIUM CHLORIDE 0.9% FLUSH
3.0000 mL | Freq: Once | INTRAVENOUS | Status: AC
  Administered 2019-04-24: 3 mL via INTRAVENOUS

## 2019-04-24 MED ORDER — FUROSEMIDE 10 MG/ML IJ SOLN
120.0000 mg | Freq: Two times a day (BID) | INTRAVENOUS | Status: DC
  Administered 2019-04-24 – 2019-04-28 (×8): 120 mg via INTRAVENOUS
  Filled 2019-04-24 (×2): qty 10
  Filled 2019-04-24 (×3): qty 12
  Filled 2019-04-24 (×4): qty 10
  Filled 2019-04-24: qty 12
  Filled 2019-04-24 (×2): qty 10

## 2019-04-24 MED ORDER — DIGOXIN 125 MCG PO TABS
125.0000 ug | ORAL_TABLET | Freq: Every day | ORAL | Status: DC
  Administered 2019-04-25 – 2019-05-06 (×12): 125 ug via ORAL
  Filled 2019-04-24 (×12): qty 1

## 2019-04-24 MED ORDER — HYPROMELLOSE (GONIOSCOPIC) 2.5 % OP SOLN
1.0000 [drp] | Freq: Three times a day (TID) | OPHTHALMIC | Status: DC | PRN
  Filled 2019-04-24: qty 15

## 2019-04-24 MED ORDER — LORATADINE 10 MG PO TABS
10.0000 mg | ORAL_TABLET | Freq: Every day | ORAL | Status: DC | PRN
  Administered 2019-05-01: 11:00:00 10 mg via ORAL
  Filled 2019-04-24: qty 1

## 2019-04-24 MED ORDER — FAMOTIDINE 20 MG PO TABS
10.0000 mg | ORAL_TABLET | Freq: Every day | ORAL | Status: DC
  Administered 2019-04-24 – 2019-05-05 (×12): 10 mg via ORAL
  Filled 2019-04-24 (×12): qty 1

## 2019-04-24 MED ORDER — ALLOPURINOL 100 MG PO TABS
100.0000 mg | ORAL_TABLET | Freq: Every evening | ORAL | Status: DC
  Administered 2019-04-24 – 2019-05-05 (×12): 100 mg via ORAL
  Filled 2019-04-24 (×12): qty 1

## 2019-04-24 MED ORDER — FLUOXETINE HCL 20 MG PO CAPS
20.0000 mg | ORAL_CAPSULE | Freq: Every day | ORAL | Status: DC
  Administered 2019-04-25 – 2019-05-06 (×12): 20 mg via ORAL
  Filled 2019-04-24 (×12): qty 1

## 2019-04-24 MED ORDER — METOLAZONE 5 MG PO TABS
5.0000 mg | ORAL_TABLET | Freq: Once | ORAL | Status: AC
  Administered 2019-04-24: 5 mg via ORAL
  Filled 2019-04-24: qty 1

## 2019-04-24 MED ORDER — INSULIN ASPART 100 UNIT/ML ~~LOC~~ SOLN
0.0000 [IU] | Freq: Three times a day (TID) | SUBCUTANEOUS | Status: DC
  Administered 2019-04-25: 1 [IU] via SUBCUTANEOUS
  Administered 2019-04-25 – 2019-04-27 (×5): 2 [IU] via SUBCUTANEOUS
  Administered 2019-04-28: 1 [IU] via SUBCUTANEOUS
  Administered 2019-04-28 – 2019-04-29 (×3): 2 [IU] via SUBCUTANEOUS
  Administered 2019-04-30: 3 [IU] via SUBCUTANEOUS
  Administered 2019-04-30: 1 [IU] via SUBCUTANEOUS
  Administered 2019-05-01: 3 [IU] via SUBCUTANEOUS
  Administered 2019-05-01: 2 [IU] via SUBCUTANEOUS
  Administered 2019-05-02: 3 [IU] via SUBCUTANEOUS
  Administered 2019-05-02: 2 [IU] via SUBCUTANEOUS
  Administered 2019-05-03: 1 [IU] via SUBCUTANEOUS
  Administered 2019-05-04 – 2019-05-05 (×2): 2 [IU] via SUBCUTANEOUS
  Administered 2019-05-05: 08:00:00 1 [IU] via SUBCUTANEOUS
  Administered 2019-05-05: 3 [IU] via SUBCUTANEOUS
  Administered 2019-05-06: 09:00:00 1 [IU] via SUBCUTANEOUS

## 2019-04-24 MED ORDER — INSULIN GLARGINE 100 UNIT/ML ~~LOC~~ SOLN
5.0000 [IU] | Freq: Every day | SUBCUTANEOUS | Status: DC
  Administered 2019-04-24 – 2019-05-05 (×12): 5 [IU] via SUBCUTANEOUS
  Filled 2019-04-24 (×14): qty 0.05

## 2019-04-24 MED ORDER — APIXABAN 5 MG PO TABS
5.0000 mg | ORAL_TABLET | Freq: Two times a day (BID) | ORAL | Status: DC
  Administered 2019-04-24 – 2019-05-06 (×24): 5 mg via ORAL
  Filled 2019-04-24 (×24): qty 1

## 2019-04-24 MED ORDER — POTASSIUM CHLORIDE CRYS ER 20 MEQ PO TBCR
40.0000 meq | EXTENDED_RELEASE_TABLET | Freq: Two times a day (BID) | ORAL | Status: DC
  Administered 2019-04-24 – 2019-04-27 (×8): 40 meq via ORAL
  Filled 2019-04-24 (×8): qty 2

## 2019-04-24 MED ORDER — POLYVINYL ALCOHOL 1.4 % OP SOLN
1.0000 [drp] | Freq: Three times a day (TID) | OPHTHALMIC | Status: DC | PRN
  Filled 2019-04-24: qty 15

## 2019-04-24 MED ORDER — NITROGLYCERIN 0.4 MG SL SUBL: 0.4000 mg | SUBLINGUAL_TABLET | SUBLINGUAL | Status: DC | PRN

## 2019-04-24 MED ORDER — ONDANSETRON HCL 4 MG/2ML IJ SOLN: 4.0000 mg | Freq: Four times a day (QID) | INTRAMUSCULAR | Status: DC | PRN

## 2019-04-24 MED ORDER — ALBUTEROL SULFATE (2.5 MG/3ML) 0.083% IN NEBU
3.0000 mL | INHALATION_SOLUTION | RESPIRATORY_TRACT | Status: DC | PRN
  Administered 2019-04-30: 13:00:00 3 mL via RESPIRATORY_TRACT
  Filled 2019-04-24: qty 3

## 2019-04-24 NOTE — ED Notes (Signed)
Pt wife contacted for update and pt speaks on phone with wife.

## 2019-04-24 NOTE — H&P (Signed)
Cardiology Admission History and Physical:   Patient ID: Mason Arnold MRN: [id number]; DOB: 1941-03-26   Admission date: 04/24/2019  Primary Care Provider: Binnie Rail, MD Primary Cardiologist: Glori Bickers, MD Primary Electrophysiologist:  None   Chief Complaint:  Shortness of Breath and Weight Gain  Patient Profile:   Mason Arnold is a 78 y.o. male with a history of CAD s/p CABG in 2000, ischemic cardiomyopathy with EF of 20% with St. Jude CRT-D, paroxysmal atrial fibrillation on Eliquis, hypertension, hyperlipidemia, and type 2 diabetes mellitus on Insulin who presented to the ED at the advice of Dr. Clayborne Dana office for 10 lb weight gain in 1 week despite increase in diuresis and worsening serum creatinine.  History of Present Illness:   Mason Arnold is a 78 year old male with a history of CAD s/p CABG in 2000, ischemic cardiomyopathy with EF of 20% with St. Jude CRT-D, paroxysmal atrial fibrillation on Eliquis, hypertension, hyperlipidemia, and type 2 diabetes mellitus who is followed by Dr. Haroldine Laws. Patient was admitted 02/03/19 to 02/06/2019 after cardiac catheterization with volume overload and hypoxia (see below). He was diuresed with IV lasix and transitioned to Lasix 80 mg daily + metolazone 2.5 mg 2x/week. Entresto was decreased due to low BPs and dizziness. BB was stopped with marginal output and soft BP. He was set up for new home O2. Discharge weight was 147.7 lbs. Patient last seen by Dr. Haroldine Laws for a telehealth visit on 04/04/2019 at which time he reported mild dyspnea on exertion and states he was wearing his O2 most of the time but denied any PND or orthopnea. Weight at home was between 156 and 157 lbs. He reported being compliant with all his medications.   Per MyChart messages starting 04/19/2019, patient has been have increased shortness of breath requiring more home O2 and weight gain. Patient was initially advised to increase his Metolazone to 5mg  daily  for 2 days and decrease his fluid intake with no significant improvement. Per MyChart messages on 04/22/2019, patient continued to have difficulty breathing and had developed a productive cough, dizziness, and generalized weakness. He was then advised to take an additional 40mg  of Torsemide for 3 days. Labs from home health RN on 04/22/2019 showed BNP over >4,500 and worsening serum creatinine (2.55).   Patient called our office today and reported 6lb weight gain in 4 days and 10lb weight gain over the last week. Patient was advised to come to the ED to be admitted.   In ED, mildly dyspneic at rest with talking. No CP. Sats ok. CXR with pulmonary vascular congestion but no frank edema.  Says ora diuretics stopped working for him about 1 week ago   Mason Arnold 02/03/19   RA = 17 RV = 63/17 PA = 70/21 (42) PCW = 27 Fick cardiac output/index = 4.0/2.2 PVR = 3.7 WU FA sat = 97% PA sat = 67%, 68%  Past Medical History:  Diagnosis Date   AICD (automatic cardioverter/defibrillator) present    Anginal pain (HCC)    Congestive heart failure ([place])    Diabetes mellitus type II    IDDM , VAH   Dyspnea    Gout    Hyperlipidemia    Hypertension    Ischemic heart disease    Left bundle branch block    Myocardial infarction ([place]) 2000; 2002; 2004; ~ 2006   Presence of permanent cardiac pacemaker    Ventricular tachycardia [facility])     Past Surgical History:  Procedure Laterality Date  A-V CARDIAC PACEMAKER INSERTION  2002; 2010   BIV ICD GENERATOR CHANGEOUT N/A 06/15/2017   Procedure: BiV ICD Generator Changeout;  Surgeon: Deboraha Sprang, MD;  Location: [place] CV LAB;  Service: Cardiovascular;  Laterality: N/A;   BIV ICD GENERTAOR CHANGE OUT N/A 12/16/2012   Procedure: BIV ICD GENERTAOR CHANGE OUT;  Surgeon: Deboraha Sprang, MD;  Location[facility] CATH LAB;  Service: Cardiovascular;  Laterality: N/A;   CARDIAC CATHETERIZATION     "3 or 4; never had interventions" (12/16/2012)   CARDIAC  CATHETERIZATION N/A 04/11/2016   Procedure: Right Heart Cath;  Surgeon: Jolaine Artist, MD;  Location: [place] CV LAB;  Service: Cardiovascular;  Laterality: N/A;   CORONARY ARTERY BYPASS GRAFT  2000   CABG X4   INSERT / REPLACE / REMOVE PACEMAKER  12/16/2012   LV lead placement; ICD assessment    LEFT AND RIGHT HEART CATHETERIZATION WITH CORONARY/GRAFT ANGIOGRAM N/A 04/04/2014   Procedure: LEFT AND RIGHT HEART CATHETERIZATION WITH Beatrix Fetters;  Surgeon: Jolaine Artist, MD;  Location: [facility] CATH LAB;  Service: Cardiovascular;  Laterality: N/A;   RIGHT/LEFT HEART CATH AND CORONARY/GRAFT ANGIOGRAPHY N/A 02/03/2019   Procedure: RIGHT/LEFT HEART CATH AND CORONARY/GRAFT ANGIOGRAPHY;  Surgeon: Jolaine Artist, MD;  Location: [place] CV LAB;  Service: Cardiovascular;  Laterality: N/A;   TONSILLECTOMY AND ADENOIDECTOMY  1954   URACHAL CYST EXCISION  ~ [place]  ?1967     Medications Prior to Admission: Prior to Admission medications   Medication Sig Start Date End Date Taking? Authorizing Provider  albuterol (PROVENTIL HFA;VENTOLIN HFA) 108 (90 Base) MCG/ACT inhaler Inhale 2 puffs into the lungs every 4 (four) hours as needed for wheezing. 12/02/18  Yes Marrian Salvage, FNP  allopurinol (ZYLOPRIM) 100 MG tablet Take 1 tablet (100 mg total) by mouth daily at 8 pm. (1900) Patient taking differently: Take 100 mg by mouth every evening.  09/28/17  Yes Burns, Claudina Lick, MD  calcium carbonate (OS-CAL) 600 MG TABS Take 600 mg by mouth daily after breakfast.    Yes [provider]  Carboxymethylcellulose Sodium (THERATEARS) 0.25 % SOLN Place 1 drop into both eyes 3 (three) times daily as needed (for dry eyes).    Yes [provider]  colchicine 0.6 MG tablet Take 0.6 mg by mouth daily as needed (gout flare up).    Yes [provider]  digoxin (LANOXIN) 0.125 MG tablet Take 1 tablet (125 mcg total) by mouth daily. 02/06/19  Yes Bhagat,  Bhavinkumar, PA  ELIQUIS 5 MG TABS tablet TAKE 1 TABLET BY MOUTH TWICE A DAY Patient taking differently: Take 5 mg by mouth 2 (two) times daily.  06/13/16  Yes Clegg, Amy D, NP  empagliflozin (JARDIANCE) 10 MG TABS tablet Take 10 mg by mouth daily. Patient taking differently: Take 10 mg by mouth at bedtime.  02/20/19  Yes Burns, Claudina Lick, MD  famotidine (PEPCID) 10 MG tablet Take 10 mg by mouth daily before supper. 2 HRS BEFORE SUPPER.   Yes [provider]  FLUoxetine (PROZAC) 20 MG capsule Take 1 capsule (20 mg total) by mouth daily. 02/16/19  Yes Burns, Claudina Lick, MD  insulin glargine (LANTUS) 100 UNIT/ML injection Inject 10-15 Units into the skin at bedtime as needed (after eating a large meal). (2100)   Yes [provider]  loratadine (CLARITIN) 10 MG tablet Take 10 mg by mouth daily as needed for allergies.    Yes [provider]  metolazone (ZAROXOLYN)  5 MG tablet Take 1 tablet (5 mg total) by mouth 2 (two) times a week. Patient taking differently: Take 5 mg by mouth 2 (two) times a week. Nancy Fetter and Wed 03/17/19  Yes Natacha Jepsen, Shaune Pascal, MD  Multiple Vitamin (MULTIVITAMIN) tablet Take 1 tablet by mouth daily after breakfast.    Yes [provider]  Omega-3 Fatty Acids (FISH OIL) 1000 MG CAPS Take 1,000 mg by mouth daily after breakfast.   Yes [provider]  potassium chloride SA (K-DUR,KLOR-CON) 20 MEQ tablet Take 2 tablets (40 mEq total) by mouth every morning AND 1 tablet (20 mEq total) every evening. ADDITONAL 40 MEQ WITH EVERY METOLAZONE DOSE. 03/14/19  Yes Alanea Woolridge, Shaune Pascal, MD  sacubitril-valsartan (ENTRESTO) 24-26 MG Take 1 tablet by mouth 2 (two) times daily. 02/06/19  Yes Bhagat, Bhavinkumar, PA  spironolactone (ALDACTONE) 25 MG tablet Take 25 mg by mouth every evening.    Yes [provider]  torsemide (DEMADEX) 20 MG tablet Take 2 tablets (40 mg total) by mouth 2 (two) times daily. 03/17/19  Yes Shelbey Spindler, Shaune Pascal, MD  furosemide (LASIX) 10  MG/ML solution Take 8 mLs (80 mg total) by mouth daily. Patient not taking: Reported on 04/24/2019 04/22/19   Georgiana Shore, NP     Allergies:    Allergies  Allergen Reactions   Ampicillin Rash    Did it involve swelling of the face/tongue/throat, SOB, or low BP? No Did it involve sudden or severe rash/hives, skin peeling, or any reaction on the inside of your mouth or nose? Yes Did you need to seek medical attention at a hospital or doctor's office? Yes When did it last happen?25+ years If all above answers are NO, may proceed with cephalosporin use.     Crestor [Rosuvastatin Calcium] Rash and Other (See Comments)    Rash as nodules ("like strawberries")   Orange Fruit [Citrus] Swelling and Other (See Comments)    Lips swelling, severe headache (including lemons and other citrus fruits)   Propoxyphene N-Acetaminophen Other (See Comments)    hallucinations  [darvicet]    Social History:   Social History   Socioeconomic History   Marital status: Married    Spouse name: Not on file   Number of children: Not on file   Years of education: Not on file   Highest education level: Not on file  Occupational History   Not on file  Social Needs   Financial resource strain: Not on file   Food insecurity:    Worry: Not on file    Inability: Not on file   Transportation needs:    Medical: Not on file    Non-medical: Not on file  Tobacco Use   Smoking status: Never Smoker   Smokeless tobacco: Former Systems developer    Types: Snuff, Chew   Tobacco comment: 12/16/2012 "stopped chew/snuff in ~ 1992 after 15 yr"  Substance and Sexual Activity   Alcohol use: Yes    Alcohol/week: 7.0 standard drinks    Types: 7 Shots of liquor per week    Comment: one shot a day   Drug use: No    Comment: none   Sexual activity: Yes  Lifestyle   Physical activity:    Days per week: Not on file    Minutes per session: Not on file   Stress: Not on file  Relationships   Social  connections:    Talks on phone: Not on file    Gets together: Not on file  Attends religious service: Not on file    Active member of club or organization: Not on file    Attends meetings of clubs or organizations: Not on file    Relationship status: Not on file   Intimate partner violence:    Fear of current or ex partner: Not on file    Emotionally abused: Not on file    Physically abused: Not on file    Forced sexual activity: Not on file  Other Topics Concern   Not on file  Social History Narrative   Lives with wife in [place].     Family History:   The patient's family history includes Aneurysm in his brother; Heart attack (age of onset: 31) in his father; Heart attack (age of onset: 13) in his maternal grandfather; Heart attack (age of onset: 62) in his paternal grandfather; Heart attack (age of onset: 53) in his maternal grandmother; Heart failure (age of onset: 45) in his father. There is no history of Diabetes, Stroke, or Cancer.    Review of Systems: [y] = yes, [ ]  = no    General: Weight gain [y]; Weight loss [ ] ; Anorexia [ ] ; Fatigue [y]; Fever [ ] ; Chills [ ] ; Weakness Blue.Reese ]   Cardiac: Chest pain/pressure [ ] ; Resting SOB [ y]; Exertional SOB [y]; Orthopnea [ ] ; Pedal Edema [y]; Palpitations [ ] ; Syncope [ ] ; Presyncope [ ] ; Paroxysmal nocturnal dyspnea[ ]    Pulmonary: Cough [ ] ; Wheezing[ ] ; Hemoptysis[ ] ; Sputum [ ] ; Snoring [ ]    GI: Vomiting[ ] ; Dysphagia[ ] ; Melena[ ] ; Hematochezia [ ] ; Heartburn[ ] ; Abdominal pain [ ] ; Constipation [ ] ; Diarrhea [ ] ; BRBPR [ ]    GU: Hematuria[ ] ; Dysuria [ ] ; Nocturia[ ]   Vascular: Pain in legs with walking [ ] ; Pain in feet with lying flat [ ] ; Non-healing sores [ ] ; Stroke [ ] ; TIA [ ] ; Slurred speech [ ] ;   Neuro: Headaches[ ] ; Vertigo[ ] ; Seizures[ ] ; Paresthesias[ ] ;Blurred vision [ ] ; Diplopia [ ] ; Vision changes [ ]    Ortho/Skin: Arthritis [y]; Joint pain [y]; Muscle pain [ ] ; Joint swelling [ ] ; Back  Pain [ ] ; Rash [ ]    Psych: Depression[ ] ; Anxiety[y ]   Heme: Bleeding problems [ ] ; Clotting disorders [ ] ; Anemia [ ]    Endocrine: Diabetes [ y]; Thyroid dysfunction[ ]    Physical Exam/Data:   Vitals:   04/24/19 1121 04/24/19 1205  BP: 102/65 91/60  Pulse: 65 65  Resp: 20 14  Temp: 97.7 F (36.5 C)   TempSrc: Oral   SpO2: 98% 96%   No intake or output data in the 24 hours ending 04/24/19 1212 Last 3 Weights 04/04/2019 03/21/2019 03/17/2019  Weight (lbs) 157 lb 156 lb 161 lb  Weight (kg) 71.215 kg 70.761 kg 73.029 kg     There is no height or weight on file to calculate BMI.   General:  Sitting up in bed. Mildly breathless with talking HEENT: normal Neck: supple. JVP to jawCarotids 2+ bilat; no bruits. No lymphadenopathy or thryomegaly appreciated. Cor: PMI nondisplaced. Regular rate & rhythm. No rubs, gallops or murmurs. Lungs: clear but decreased at bases Abdomen: obese soft, nontender, + distended. No hepatosplenomegaly. No bruits or masses. Good bowel sounds. Extremities: no cyanosis, clubbing, rash, 1-2+ edema wamr Neuro: alert & orientedx3, cranial nerves grossly intact. moves all 4 extremities w/o difficulty. Affect pleasant   EKG: AV pacing 22 Personally reviewed    Laboratory Data:  Chemistry Recent Labs  Lab 04/22/19 1330  NA 130*  K 4.0  CL 87*  CO2 27  GLUCOSE 151*  BUN 71*  CREATININE 2.55*  CALCIUM 9.6  GFRNONAA 23*  GFRAA 27*  ANIONGAP 16*    No results for input(s): PROT, ALBUMIN, AST, ALT, ALKPHOS, BILITOT in the last 168 hours. Hematology Recent Labs  Lab 04/22/19 1330 04/24/19 1129  WBC 10.1 9.6  RBC 4.46 4.42  HGB 14.0 14.1  HCT 41.6 41.1  MCV 93.3 93.0  MCH 31.4 31.9  MCHC 33.7 34.3  RDW 15.9* 16.0*  PLT 168 167   Cardiac EnzymesNo results for input(s): TROPONINI in the last 168 hours. No results for input(s): TROPIPOC in the last 168 hours.  BNP Recent Labs  Lab 04/22/19 1330  BNP >4,500.0*    DDimer No results  for input(s): DDIMER in the last 168 hours.  Radiology/Studies:  No results found.  Assessment and Plan:   1. Acute on chronic systolic CHF: Ischemic cardiomyopathy s/p St Jude CRT-D, EF 20%(04/2016) - Echo 11/18 EF stable 25-30% - CPX 11/18 Peak VO2: 18.5 (76% predicted peak VO2) VE/VCO2 slope: 37 - RHC 02/03/19 with elevated volume PCWP 27 with CI 2.2 - Has recurrent volume overload with diuretic resistance. Will start lasix 120IV bid and metolazone 5 daily - Watch renal function and fluid intake closely  - Hold Entresto for now with SBP in low 90s. Off b-blocker with low output previously - Continue digoxin. Check level 2. CAD: - No s/s ischemia - LHC 2/20 showed severe 3V CAD with all grafts patent.  - Off ASA with Eliquis. Continue statin 3. Paroxysmal atrial fibrillation:  - Continue eliquis 5 mg bid.  Drop to 2.5 bid when he turns 79 4. H/o Recurrent syncope  - None recently 5. ICD  - Follows with Dr. Caryl Comes. No change. 6. Acute on CKD, III-IV - Creatinine up to 2.3. Likely cardiorenal in nature - Baseline 1.3 - 1.6. Hopefully will improve with diuresis  Monitor daily BMET. Let BP elevate 7. DM2 - followed by PCP - Hold Jardiance with GFR < 30  - Cover with SSI 9. Hypokalemia - K 3.9. Supp K.   Glori Bickers, MD  1:51 PM    For questions or updates, please contact [place] Please consult [email or web address] for contact info under        Signed, Darreld Mclean, PA-C  04/24/2019 12:12 PM

## 2019-04-24 NOTE — ED Triage Notes (Signed)
Pt sent here from Dr. Clayborne Dana office for admission due to 6lb weight gain in 4 days and increased kidney function labs. Pt seems SOB during triage, wheezing noted. Pt alert and oriented however wife reports he has been forgetting things recently. No hx of dementia.   Pts wife Ivin Booty cell [phone]

## 2019-04-24 NOTE — ED Notes (Signed)
Dr. Rogene Houston aware of troponin .

## 2019-04-24 NOTE — ED Provider Notes (Addendum)
[facility] EMERGENCY DEPARTMENT Provider Note   CSN: [id number] Arrival date & time: 04/24/19  1114    History   Chief Complaint Chief Complaint  Patient presents with  . Congestive Heart Failure    HPI Mason Arnold is a 78 y.o. male.     Patient with known history of severe systolic heart failure.  Patient also has pacemaker defibrillator.  Patient's been having weight gain over the next several days.  Patient's wife is been in close contact with cardiology.  They got concerned that things were getting worse is normally on 2 L of oxygen but had to be increased to 3.  He was sent in essentially to be admitted to the heart failure service and to be diuresed.  Dr. Haroldine Laws was made aware.     Past Medical History:  Diagnosis Date  . AICD (automatic cardioverter/defibrillator) present   . Anginal pain ([place])   . Congestive heart failure ([place])   . Diabetes mellitus type II    IDDM , VAH  . Dyspnea   . Gout   . Hyperlipidemia   . Hypertension   . Ischemic heart disease   . Left bundle branch block   . Myocardial infarction ([place]) 2000; 2002; 2004; ~ 2006  . Presence of permanent cardiac pacemaker   . Ventricular tachycardia [facility])     Patient Active Problem List   Diagnosis Date Noted  . Hypoxia, secondary to heart failure 02/16/2019  . Acute on chronic systolic heart failure ([place]) 02/13/2019  . Systolic CHF ([place]) 82/50/5397  . Syncope 06/29/2013  . Ventricular tachycardia ([place]) 06/29/2013  . Depression 11/30/2012  . 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 Atrial fibrillation ([place]) 02/03/2011  . Chronic systolic heart failure ([place]) 10/01/2010  . Coronary atherosclerosis 06/20/2010  . Cardiomyopathy, ischemic 06/12/2009  . HYPERLIPIDEMIA 02/11/2008  . ORTHOSTATIC HYPOTENSION 02/11/2008  . Diabetes ([place]) 10/18/2007  . GOUT 03/26/2007    Past Surgical History:  Procedure Laterality Date  . A-V  CARDIAC PACEMAKER INSERTION  2002; 2010  . BIV ICD GENERATOR CHANGEOUT N/A 06/15/2017   Procedure: BiV ICD Generator Changeout;  Surgeon: Deboraha Sprang, MD;  Location: [facility] CV LAB;  Service: Cardiovascular;  Laterality: N/A;  . BIV ICD GENERTAOR CHANGE OUT N/A 12/16/2012   Procedure: BIV ICD GENERTAOR CHANGE OUT;  Surgeon: Deboraha Sprang, MD;  Location: [facility] CATH LAB;  Service: Cardiovascular;  Laterality: N/A;  . CARDIAC CATHETERIZATION     "3 or 4; never had interventions" (12/16/2012)  . CARDIAC CATHETERIZATION N/A 04/11/2016   Procedure: Right Heart Cath;  Surgeon: Jolaine Artist, MD;  Location: [facility] CV LAB;  Service: Cardiovascular;  Laterality: N/A;  . CORONARY ARTERY BYPASS GRAFT  2000   CABG X4  . INSERT / REPLACE / REMOVE PACEMAKER  12/16/2012   LV lead placement; ICD assessment   . LEFT AND RIGHT HEART CATHETERIZATION WITH CORONARY/GRAFT ANGIOGRAM N/A 04/04/2014   Procedure: LEFT AND RIGHT HEART CATHETERIZATION WITH Beatrix Fetters;  Surgeon: Jolaine Artist, MD;  Location: [facility] CATH LAB;  Service: Cardiovascular;  Laterality: N/A;  . RIGHT/LEFT HEART CATH AND CORONARY/GRAFT ANGIOGRAPHY N/A 02/03/2019   Procedure: RIGHT/LEFT HEART CATH AND CORONARY/GRAFT ANGIOGRAPHY;  Surgeon: Jolaine Artist, MD;  Location: [facility] CV LAB;  Service: Cardiovascular;  Laterality: N/A;  . [place]  . URACHAL CYST EXCISION  ~ 1995  . VASECTOMY  ?[place]  Medications    Prior to Admission medications   Medication Sig Start Date End Date Taking? Authorizing Provider  albuterol (PROVENTIL HFA;VENTOLIN HFA) 108 (90 Base) MCG/ACT inhaler Inhale 2 puffs into the lungs every 4 (four) hours as needed for wheezing. 12/02/18  Yes Marrian Salvage, FNP  allopurinol (ZYLOPRIM) 100 MG tablet Take 1 tablet (100 mg total) by mouth daily at 8 pm. (1900) Patient taking differently: Take 100 mg by mouth every evening.  09/28/17  Yes Burns, Claudina Lick, MD  calcium carbonate (OS-CAL) 600 MG TABS Take 600 mg by mouth daily after breakfast.    Yes [provider]  Carboxymethylcellulose Sodium (THERATEARS) 0.25 % SOLN Place 1 drop into both eyes 3 (three) times daily as needed (for dry eyes).    Yes [provider]  colchicine 0.6 MG tablet Take 0.6 mg by mouth daily as needed (gout flare up).    Yes [provider]  digoxin (LANOXIN) 0.125 MG tablet Take 1 tablet (125 mcg total) by mouth daily. 02/06/19  Yes Bhagat, Bhavinkumar, PA  ELIQUIS 5 MG TABS tablet TAKE 1 TABLET BY MOUTH TWICE A DAY Patient taking differently: Take 5 mg by mouth 2 (two) times daily.  06/13/16  Yes Clegg, Amy D, NP  empagliflozin (JARDIANCE) 10 MG TABS tablet Take 10 mg by mouth daily. Patient taking differently: Take 10 mg by mouth at bedtime.  02/20/19  Yes Burns, Claudina Lick, MD  famotidine (PEPCID) 10 MG tablet Take 10 mg by mouth daily before supper. 2 HRS BEFORE SUPPER.   Yes [provider]  FLUoxetine (PROZAC) 20 MG capsule Take 1 capsule (20 mg total) by mouth daily. 02/16/19  Yes Burns, Claudina Lick, MD  insulin glargine (LANTUS) 100 UNIT/ML injection Inject 10-15 Units into the skin at bedtime as needed (after eating a large meal). (2100)   Yes [provider]  loratadine (CLARITIN) 10 MG tablet Take 10 mg by mouth daily as needed for allergies.    Yes [provider]  metolazone (ZAROXOLYN) 5 MG tablet Take 1 tablet (5 mg total) by mouth 2 (two) times a week. Patient taking differently: Take 5 mg by mouth 2 (two) times a week. Nancy Fetter and Wed 03/17/19  Yes Bensimhon, Shaune Pascal, MD  Multiple Vitamin (MULTIVITAMIN) tablet Take 1 tablet by mouth daily after breakfast.    Yes [provider]  Omega-3 Fatty Acids (FISH OIL) 1000 MG CAPS Take 1,000 mg by mouth daily after breakfast.   Yes [provider]  potassium chloride SA (K-DUR,KLOR-CON) 20 MEQ tablet Take 2 tablets (40 mEq total) by mouth every morning AND 1  tablet (20 mEq total) every evening. ADDITONAL 40 MEQ WITH EVERY METOLAZONE DOSE. 03/14/19  Yes Bensimhon, Shaune Pascal, MD  sacubitril-valsartan (ENTRESTO) 24-26 MG Take 1 tablet by mouth 2 (two) times daily. 02/06/19  Yes Bhagat, Bhavinkumar, PA  spironolactone (ALDACTONE) 25 MG tablet Take 25 mg by mouth every evening.    Yes [provider]  torsemide (DEMADEX) 20 MG tablet Take 2 tablets (40 mg total) by mouth 2 (two) times daily. 03/17/19  Yes Bensimhon, Shaune Pascal, MD  furosemide (LASIX) 10 MG/ML solution Take 8 mLs (80 mg total) by mouth daily. Patient not taking: Reported on 04/24/2019 04/22/19   Georgiana Shore, NP    Family History Family History  Problem Relation Age of Onset  . Heart attack Maternal Grandfather 78  . Heart attack Maternal Grandmother 82  . Heart attack Paternal Grandfather 68  .  Heart attack Father 21  . Heart failure Father 37  . Aneurysm Brother   . Diabetes Neg Hx   . Stroke Neg Hx   . Cancer Neg Hx     Social History Social History   Tobacco Use  . Smoking status: Never Smoker  . Smokeless tobacco: Former Systems developer    Types: Snuff, Chew  . Tobacco comment: 12/16/2012 "stopped chew/snuff in ~ 1992 after 15 yr"  Substance Use Topics  . Alcohol use: Yes    Alcohol/week: 7.0 standard drinks    Types: 7 Shots of liquor per week    Comment: one shot a day  . Drug use: No    Comment: none     Allergies   Ampicillin; Crestor [rosuvastatin calcium]; Orange fruit [citrus]; and Propoxyphene n-acetaminophen   Review of Systems Review of Systems  Constitutional: Negative for chills and fever.  HENT: Negative for congestion, rhinorrhea and sore throat.   Eyes: Negative for visual disturbance.  Respiratory: Positive for shortness of breath. Negative for cough.   Cardiovascular: Positive for leg swelling. Negative for chest pain.  Gastrointestinal: Negative for abdominal pain, diarrhea, nausea and vomiting.  Genitourinary: Negative for dysuria.   Musculoskeletal: Negative for back pain and neck pain.  Skin: Negative for rash.  Neurological: Negative for dizziness, light-headedness and headaches.  Hematological: Does not bruise/bleed easily.  Psychiatric/Behavioral: Negative for confusion.     Physical Exam Updated Vital Signs BP 100/74   Pulse 70   Temp 97.7 F (36.5 C) (Oral)   Resp (!) 22   SpO2 99%   Physical Exam Vitals signs and nursing note reviewed.  Constitutional:      General: He is not in acute distress.    Appearance: Normal appearance. He is well-developed.  HENT:     Head: Normocephalic and atraumatic.  Eyes:     Extraocular Movements: Extraocular movements intact.     Conjunctiva/sclera: Conjunctivae normal.     Pupils: Pupils are equal, round, and reactive to light.  Neck:     Musculoskeletal: Normal range of motion and neck supple.  Cardiovascular:     Rate and Rhythm: Normal rate and regular rhythm.     Heart sounds: No murmur.  Pulmonary:     Effort: Pulmonary effort is normal. No respiratory distress.     Breath sounds: Normal breath sounds. No rales.  Abdominal:     Palpations: Abdomen is soft.     Tenderness: There is no abdominal tenderness.  Musculoskeletal:        General: Swelling present.  Skin:    General: Skin is warm and dry.  Neurological:     General: No focal deficit present.     Mental Status: He is alert and oriented to person, place, and time.      ED Treatments / Results  Labs (all labs ordered are listed, but only abnormal results are displayed) Labs Reviewed  BASIC METABOLIC PANEL - Abnormal; Notable for the following components:      Result Value   Sodium 131 (*)    Chloride 86 (*)    Glucose, Bld 144 (*)    BUN 75 (*)    Creatinine, Ser 2.32 (*)    GFR calc non Af Amer 26 (*)    GFR calc Af Amer 30 (*)    Anion gap 18 (*)    All other components within normal limits  CBC - Abnormal; Notable for the following components:   RDW 16.0 (*)    All  other  components within normal limits  TROPONIN I - Abnormal; Notable for the following components:   Troponin I 0.06 (*)    All other components within normal limits  SARS CORONAVIRUS 2 (HOSPITAL ORDER, [facility])    EKG EKG Interpretation  Date/Time:  Sunday Apr 24 2019 11:17:45 EDT Ventricular Rate:  70 PR Interval:    QRS Duration: 190 QT Interval:  494 QTC Calculation: 533 R Axis:   -176 Text Interpretation:  AV sequential or dual chamber electronic pacemaker Confirmed by Fredia Sorrow [id number]) on 04/24/2019 1:27:18 PM   Radiology Dg Chest 2 View  Result Date: 04/24/2019 CLINICAL DATA:  Heart failure. EXAM: CHEST - 2 VIEW COMPARISON:  02/05/2019 FINDINGS: Left chest wall ICD is noted with leads in the right atrial appendage and right ventricle. The heart size appears mildly enlarged. Aortic atherosclerosis. Previous median sternotomy and CABG procedure. No pleural effusion identified. Pulmonary vascular congestion without edema. No airspace consolidation. IMPRESSION: 1. Cardiac enlargement and pulmonary vascular congestion. 2.  Aortic Atherosclerosis (ICD10-I70.0). Electronically Signed   By: Kerby Moors M.D.   On: 04/24/2019 12:46    Procedures Procedures (including critical care time)  Medications Ordered in ED Medications  sodium chloride flush (NS) 0.9 % injection 3 mL (3 mLs Intravenous Given 04/24/19 1210)     Initial Impression / Assessment and Plan / ED Course  I have reviewed the triage vital signs and the nursing notes.  Pertinent labs & imaging results that were available during my care of the patient were reviewed by me and considered in my medical decision making (see chart for details).        Patient with progressive weight gain.  Patient's wife contacted [facility] on call for cardiology.  They got concerned that he is gaining fluid and needs to be brought in to be diuresed.  This is a common scenario for patient.  [facility] notified.  Bensimhon seen patient and has put in the admitting orders.  Patient's BUN and creatinine up some today at 75 and 2.32.  His troponin slightly elevated at 0.06 but this is probably a chronic issue.  Chest x-ray shows some cardiac enlargement and pulmonary vascular congestion.  But no florid pulmonary edema.  Patient has a pacemaker defibrillator.  Final Clinical Impressions(s) / ED Diagnoses   Final diagnoses:  Acute on chronic systolic congestive heart failure [facility])    ED Discharge Orders    None       Fredia Sorrow, MD 04/24/19 1310    Fredia Sorrow, MD 04/24/19 1327

## 2019-04-24 NOTE — ED Notes (Signed)
ED TO INPATIENT HANDOFF REPORT  ED Nurse Name and Phone #: [facility] [phone]   S Name/Age/Gender Mason Arnold 78 y.o. male Room/Bed: 025C/025C  Code Status   Code Status: Prior  Home/SNF/Other Home Patient oriented to: self, place, time and situation Is this baseline? Yes   Triage Complete: Triage complete  Chief Complaint heart failure  Triage Note Pt sent here from Dr. Clayborne Dana office for admission due to 6lb weight gain in 4 days and increased kidney function labs. Pt seems SOB during triage, wheezing noted. Pt alert and oriented however wife reports he has been forgetting things recently. No hx of dementia.   Pts wife Ivin Booty cell [phone]   Allergies Allergies  Allergen Reactions  . Ampicillin Rash    Did it involve swelling of the face/tongue/throat, SOB, or low BP? No Did it involve sudden or severe rash/hives, skin peeling, or any reaction on the inside of your mouth or nose? Yes Did you need to seek medical attention at a hospital or doctor's office? Yes When did it last happen?25+ years If all above answers are "NO", may proceed with cephalosporin use.    Alveta Heimlich [Rosuvastatin Calcium] Rash and Other (See Comments)    Rash as nodules ("like strawberries")  . Orange Fruit [Citrus] Swelling and Other (See Comments)    Lips swelling, severe headache (including lemons and other citrus fruits)  . Propoxyphene N-Acetaminophen Other (See Comments)    hallucinations  [darvicet]    Level of Care/Admitting Diagnosis ED Disposition    ED Disposition Condition Comment   Admit  Hospital Area: [facility] [[id number]]  Level of Care: Telemetry Cardiac [103]  Covid Evaluation: Screening Protocol (No Symptoms)  Diagnosis: Acute on chronic systolic heart failure (HCC) [428.23.ICD-9-CM]  Admitting Physician: Jolaine Artist [[id number]]  Attending Physician: Jolaine Artist [[id number]]  Estimated length of stay: past midnight tomorrow   Certification:: I certify this patient will need inpatient services for at least 2 midnights  PT Class (Do Not Modify): Inpatient [101]  PT Acc Code (Do Not Modify): Private [1]       B Medical/Surgery History Past Medical History:  Diagnosis Date  . AICD (automatic cardioverter/defibrillator) present   . Anginal pain ([place])   . Congestive heart failure ([place])   . Diabetes mellitus type II    IDDM , VAH  . Dyspnea   . Gout   . Hyperlipidemia   . Hypertension   . Ischemic heart disease   . Left bundle branch block   . Myocardial infarction ([place]) 2000; 2002; 2004; ~ 2006  . Presence of permanent cardiac pacemaker   . Ventricular tachycardia [facility])    Past Surgical History:  Procedure Laterality Date  . A-V CARDIAC PACEMAKER INSERTION  2002; 2010  . BIV ICD GENERATOR CHANGEOUT N/A 06/15/2017   Procedure: BiV ICD Generator Changeout;  Surgeon: Deboraha Sprang, MD;  Location: [facility] CV LAB;  Service: Cardiovascular;  Laterality: N/A;  . BIV ICD GENERTAOR CHANGE OUT N/A 12/16/2012   Procedure: BIV ICD GENERTAOR CHANGE OUT;  Surgeon: Deboraha Sprang, MD;  Location: [facility] CATH LAB;  Service: Cardiovascular;  Laterality: N/A;  . CARDIAC CATHETERIZATION     "3 or 4; never had interventions" (12/16/2012)  . CARDIAC CATHETERIZATION N/A 04/11/2016   Procedure: Right Heart Cath;  Surgeon: Jolaine Artist, MD;  Location: [facility] CV LAB;  Service: Cardiovascular;  Laterality: N/A;  . CORONARY ARTERY BYPASS GRAFT  2000   CABG X4  . INSERT /  REPLACE / REMOVE PACEMAKER  12/16/2012   LV lead placement; ICD assessment   . LEFT AND RIGHT HEART CATHETERIZATION WITH CORONARY/GRAFT ANGIOGRAM N/A 04/04/2014   Procedure: LEFT AND RIGHT HEART CATHETERIZATION WITH Beatrix Fetters;  Surgeon: Jolaine Artist, MD;  Location: [facility] CATH LAB;  Service: Cardiovascular;  Laterality: N/A;  . RIGHT/LEFT HEART CATH AND CORONARY/GRAFT ANGIOGRAPHY N/A 02/03/2019   Procedure: RIGHT/LEFT HEART CATH AND  CORONARY/GRAFT ANGIOGRAPHY;  Surgeon: Jolaine Artist, MD;  Location: [facility] CV LAB;  Service: Cardiovascular;  Laterality: N/A;  . [place]  . URACHAL CYST EXCISION  ~ 1995  . VASECTOMY  ?1967     A IV Location/Drains/Wounds Patient Lines/Drains/Airways Status   Active Line/Drains/Airways    Name:   Placement date:   Placement time:   Site:   Days:   Peripheral IV 04/24/19 Right Antecubital   04/24/19    1208    Antecubital   less than 1          Intake/Output Last 24 hours No intake or output data in the 24 hours ending 04/24/19 1308  Labs/Imaging Results for orders placed or performed during the hospital encounter of 04/24/19 (from the past 48 hour(s))  Basic metabolic panel     Status: Abnormal   Collection Time: 04/24/19 11:29 AM  Result Value Ref Range   Sodium 131 (L) 135 - 145 mmol/L   Potassium 3.9 3.5 - 5.1 mmol/L   Chloride 86 (L) 98 - 111 mmol/L   CO2 27 22 - 32 mmol/L   Glucose, Bld 144 (H) 70 - 99 mg/dL   BUN 75 (H) 8 - 23 mg/dL   Creatinine, Ser 2.32 (H) 0.61 - 1.24 mg/dL   Calcium 9.5 8.9 - 10.3 mg/dL   GFR calc non Af Amer 26 (L) >60 mL/min   GFR calc Af Amer 30 (L) >60 mL/min   Anion gap 18 (H) 5 - 15    Comment: Performed at [facility], [address], [place], [place] [address]  CBC     Status: Abnormal   Collection Time: 04/24/19 11:29 AM  Result Value Ref Range   WBC 9.6 4.0 - 10.5 K/uL   RBC 4.42 4.22 - 5.81 MIL/uL   Hemoglobin 14.1 13.0 - 17.0 g/dL   HCT 41.1 39.0 - 52.0 %   MCV 93.0 80.0 - 100.0 fL   MCH 31.9 26.0 - 34.0 pg   MCHC 34.3 30.0 - 36.0 g/dL   RDW 16.0 (H) 11.5 - 15.5 %   Platelets 167 150 - 400 K/uL   nRBC 0.0 0.0 - 0.2 %    Comment: Performed at [facility], [place] [address], [place], [place] [address]  Troponin I - ONCE - STAT     Status: Abnormal   Collection Time: 04/24/19 11:29 AM  Result Value Ref Range   Troponin I 0.06 (HH) <0.03 ng/mL    Comment: CRITICAL RESULT  CALLED TO, READ BACK BY AND VERIFIED WITH: Thurmond Butts AT 1216 04/24/2019 BY L BENFIELD Performed at [facility], [place] [address] [place], [place] [address]    Dg Chest 2 View  Result Date: 04/24/2019 CLINICAL DATA:  Heart failure. EXAM: CHEST - 2 VIEW COMPARISON:  02/05/2019 FINDINGS: Left chest wall ICD is noted with leads in the right atrial appendage and right ventricle. The heart size appears mildly enlarged. Aortic atherosclerosis. Previous median sternotomy and CABG procedure. No pleural effusion identified. Pulmonary vascular congestion  without edema. No airspace consolidation. IMPRESSION: 1. Cardiac enlargement and pulmonary vascular congestion. 2.  Aortic Atherosclerosis (ICD10-I70.0). Electronically Signed   By: Kerby Moors M.D.   On: 04/24/2019 12:46    Pending Labs [employer] (From admission, onward)    Start     Ordered   Signed and Held  Digoxin level  Once,   R     Signed and Held   Signed and Held  Magnesium  Once,   R     Signed and Held   Signed and Held  Brain natriuretic peptide  Once,   R     Signed and Held   Signed and Held  Basic metabolic panel  Tomorrow morning,   R     Signed and Held          Vitals/Pain Today's Vitals   04/24/19 1205 04/24/19 1230 04/24/19 1235 04/24/19 1300  BP: 91/60 100/70  100/74  Pulse: 65 70  70  Resp: 14 (!) 21  (!) 22  Temp:      TempSrc:      SpO2: 96% 99%  99%  PainSc:   0-No pain     Isolation Precautions No active isolations  Medications Medications  sodium chloride flush (NS) 0.9 % injection 3 mL (3 mLs Intravenous Given 04/24/19 1210)    Mobility walks with device Moderate fall risk   Focused Assessments Cardiac Assessment Handoff:    Lab Results  Component Value Date   CKTOTAL 165 09/21/2007   CKMB 2.4 09/21/2007   TROPONINI 0.06 ([place]) 04/24/2019   No results found for: DDIMER Does the Patient currently have chest pain? No     R Recommendations: See Admitting Provider  Note  Report given to:   Additional Notes:

## 2019-04-24 NOTE — Telephone Encounter (Signed)
Patient called stating that he has gained 6lbs in 4 days and 10lbs in 1 week.  His weight today is 166lbs.  His wife says that he has been sleeping a lot.  He has no swelling in his feet.  His SOB has improved on the higher dose of Torsemide 40mg  BID on Friday and Saturday has continued to gain weight despite. Labs on Friday showed a bump in creatinine from 1.79 to 2.55 and BNP > 4500.  He is not anemic. I have instructed him to go to the ER at [facility] now for admission to advanced HF service for IV diuretics as well as likely inotropic therapy.  Dr. Haroldine Laws is aware.

## 2019-04-24 NOTE — ED Notes (Signed)
Dr. Payton Emerald at bedside.

## 2019-04-24 NOTE — Progress Notes (Signed)
Patient arrived to room 20 via stretcher from ED. Patient is alert and oriented, VSS, NAD. Patient placed on 3L o2 [place] which he is on at home. Bed is in the lowest and locked position with bed rails up times 3. Belongings and call bell within reach. Bed alarm and fall bracelet applied. Wife contacted and updated to patient location and patient's room number. Admission and assessment completed with patient.

## 2019-04-25 ENCOUNTER — Inpatient Hospital Stay: Payer: Self-pay

## 2019-04-25 LAB — BASIC METABOLIC PANEL
Anion gap: 15 (ref 5–15)
Anion gap: 13 (ref 5–15)
BUN: 79 mg/dL — ABNORMAL HIGH (ref 8–23)
BUN: 80 mg/dL — ABNORMAL HIGH (ref 8–23)
CO2: 28 mmol/L (ref 22–32)
CO2: 30 mmol/L (ref 22–32)
Calcium: 9.6 mg/dL (ref 8.9–10.3)
Calcium: 9.4 mg/dL (ref 8.9–10.3)
Chloride: 88 mmol/L — ABNORMAL LOW (ref 98–111)
Chloride: 86 mmol/L — ABNORMAL LOW (ref 98–111)
Creatinine, Ser: 2.43 mg/dL — ABNORMAL HIGH (ref 0.61–1.24)
Creatinine, Ser: 2.52 mg/dL — ABNORMAL HIGH (ref 0.61–1.24)
GFR calc Af Amer: 28 mL/min — ABNORMAL LOW (ref >60)
GFR calc Af Amer: 27 mL/min — ABNORMAL LOW (ref >60)
GFR calc non Af Amer: 24 mL/min — ABNORMAL LOW (ref >60)
GFR calc non Af Amer: 23 mL/min — ABNORMAL LOW (ref >60)
Glucose, Bld: 135 mg/dL — ABNORMAL HIGH (ref 70–99)
Glucose, Bld: 193 mg/dL — ABNORMAL HIGH (ref 70–99)
Potassium: 4 mmol/L (ref 3.5–5.1)
Potassium: 4.2 mmol/L (ref 3.5–5.1)
Sodium: 131 mmol/L — ABNORMAL LOW (ref 135–145)
Sodium: 129 mmol/L — ABNORMAL LOW (ref 135–145)

## 2019-04-25 LAB — COOXEMETRY PANEL
Carboxyhemoglobin: 1.5 % (ref 0.5–1.5)
Methemoglobin: 1.7 % — ABNORMAL HIGH (ref 0.0–1.5)
O2 Saturation: 65.1 %
Total hemoglobin: 13.5 g/dL (ref 12.0–16.0)

## 2019-04-25 LAB — GLUCOSE, CAPILLARY
Glucose-Capillary: 142 mg/dL — ABNORMAL HIGH (ref 70–99)
Glucose-Capillary: 159 mg/dL — ABNORMAL HIGH (ref 70–99)
Glucose-Capillary: 95 mg/dL (ref 70–99)
Glucose-Capillary: 142 mg/dL — ABNORMAL HIGH (ref 70–99)

## 2019-04-25 MED ORDER — SIMVASTATIN 20 MG PO TABS
20.0000 mg | ORAL_TABLET | Freq: Every day | ORAL | Status: DC
  Administered 2019-04-25 – 2019-05-05 (×11): 20 mg via ORAL
  Filled 2019-04-25 (×11): qty 1

## 2019-04-25 MED ORDER — SODIUM CHLORIDE 0.9% FLUSH
10.0000 mL | Freq: Two times a day (BID) | INTRAVENOUS | Status: DC
  Administered 2019-04-25: 40 mL
  Administered 2019-04-26 – 2019-04-27 (×3): 10 mL
  Administered 2019-04-28 – 2019-04-30 (×3): 20 mL
  Administered 2019-04-30: 10 mL
  Administered 2019-05-01: 23:00:00 20 mL
  Administered 2019-05-01 – 2019-05-05 (×8): 10 mL

## 2019-04-25 MED ORDER — SODIUM CHLORIDE 0.9% FLUSH: 10.0000 mL | INTRAVENOUS | Status: DC | PRN

## 2019-04-25 NOTE — Plan of Care (Signed)
  Problem: Cardiac: Goal: Ability to achieve and maintain adequate cardiopulmonary perfusion will improve Outcome: Progressing   Problem: Activity: Goal: Capacity to carry out activities will improve Outcome: Progressing   Problem: Education: Goal: Ability to demonstrate management of disease process will improve Outcome: Progressing   Problem: Education: Goal: Ability to verbalize understanding of medication therapies will improve Outcome: Progressing

## 2019-04-25 NOTE — Progress Notes (Signed)
Patient ID: Mason Arnold, male   DOB: 1941/05/18, 78 y.o.   MRN: [id number]     Advanced Heart Failure Rounding Note  PCP-Cardiologist: No primary care provider on file.   Subjective:    He had some diuresis overnight and has had some this morning as well.  Creatinine 2.3 => 2.5.  No dyspnea at rest. SBP 90s.    Objective:   Weight Range: 75.7 kg Body mass index is 27.76 kg/m.   Vital Signs:   Temp:  [97.5 F (36.4 C)-97.8 F (36.6 C)] 97.6 F (36.4 C) (05/18 0513) Pulse Rate:  [64-70] 64 (05/18 0841) Resp:  [14-22] 18 (05/18 0841) BP: (86-114)/(50-74) 98/64 (05/18 0841) SpO2:  [96 %-100 %] 100 % (05/18 0513) Weight:  [74.5 kg-75.7 kg] 75.7 kg (05/18 0513) Last BM Date: 04/24/19  Weight change: Filed Weights   04/24/19 1435 04/25/19 0513  Weight: 74.5 kg 75.7 kg    Intake/Output:   Intake/Output Summary (Last 24 hours) at 04/25/2019 1123 Last data filed at 04/25/2019 1118 Gross per 24 hour  Intake 1200 ml  Output 1950 ml  Net -750 ml      Physical Exam    General:  Well appearing. No resp difficulty HEENT: Normal Neck: Supple. JVP 14+ cm. Carotids 2+ bilat; no bruits. No lymphadenopathy or thyromegaly appreciated. Cor: PMI lateral. Regular rate & rhythm with S3, 2/6 HSM apex. Lungs: Clear Abdomen: Soft, nontender, nondistended. No hepatosplenomegaly. No bruits or masses. Good bowel sounds. Extremities: No cyanosis, clubbing, rash, edema Neuro: Alert & orientedx3, cranial nerves grossly intact. moves all 4 extremities w/o difficulty. Affect pleasant   Telemetry   NSR (personally reviewed)  Labs    CBC Recent Labs    04/22/19 1330 04/24/19 1129  WBC 10.1 9.6  HGB 14.0 14.1  HCT 41.6 41.1  MCV 93.3 93.0  PLT 168 979   Basic Metabolic Panel Recent Labs    04/24/19 1449 04/25/19 0755 04/25/19 0930  NA  --  131* 129*  K  --  4.0 4.2  CL  --  88* 86*  CO2  --  28 30  GLUCOSE  --  135* 193*  BUN  --  79* 80*  CREATININE  --  2.43* 2.52*   CALCIUM  --  9.6 9.4  MG 2.9*  --   --    Liver Function Tests No results for input(s): AST, ALT, ALKPHOS, BILITOT, PROT, ALBUMIN in the last 72 hours. No results for input(s): LIPASE, AMYLASE in the last 72 hours. Cardiac Enzymes Recent Labs    04/24/19 1129  TROPONINI 0.06*    BNP: BNP (last 3 results) Recent Labs    02/02/19 1500 04/22/19 1330 04/24/19 1449  BNP 3,871.3* >4,500.0* 3,264.2*    ProBNP (last 3 results) No results for input(s): PROBNP in the last 8760 hours.   D-Dimer No results for input(s): DDIMER in the last 72 hours. Hemoglobin A1C No results for input(s): HGBA1C in the last 72 hours. Fasting Lipid Panel No results for input(s): CHOL, HDL, LDLCALC, TRIG, CHOLHDL, LDLDIRECT in the last 72 hours. Thyroid Function Tests No results for input(s): TSH, T4TOTAL, T3FREE, THYROIDAB in the last 72 hours.  Invalid input(s): FREET3  Other results:   Imaging    Dg Chest 2 View  Result Date: 04/24/2019 CLINICAL DATA:  Heart failure. EXAM: CHEST - 2 VIEW COMPARISON:  02/05/2019 FINDINGS: Left chest wall ICD is noted with leads in the right atrial appendage and right ventricle. The heart size appears mildly  enlarged. Aortic atherosclerosis. Previous median sternotomy and CABG procedure. No pleural effusion identified. Pulmonary vascular congestion without edema. No airspace consolidation. IMPRESSION: 1. Cardiac enlargement and pulmonary vascular congestion. 2.  Aortic Atherosclerosis (ICD10-I70.0). Electronically Signed   By: Kerby Moors M.D.   On: 04/24/2019 12:46   [place] Ekg Site Rite  Result Date: 04/25/2019 If Site Rite image not attached, placement could not be confirmed due to current cardiac rhythm.     Medications:     Scheduled Medications: . allopurinol  100 mg Oral QPM  . apixaban  5 mg Oral BID  . digoxin  125 mcg Oral Daily  . famotidine  10 mg Oral QAC supper  . FLUoxetine  20 mg Oral Daily  . insulin aspart  0-9 Units Subcutaneous  TID WC  . insulin glargine  5 Units Subcutaneous QHS  . potassium chloride  40 mEq Oral BID  . spironolactone  25 mg Oral QPM     Infusions: . furosemide 120 mg (04/25/19 0848)     PRN Medications:  albuterol, loratadine, nitroGLYCERIN, ondansetron (ZOFRAN) IV, polyvinyl alcohol   Assessment/Plan   1. Acute on chronic systolic CHF: Echo (7/84) with EF 20-25%, severely decreased RV systolic function, severe MR. St Jude CRT-D.  Ischemic cardiomyopathy with significant co-existing RV failure.  This is now complicated as well by cardiorenal syndrome.  Creatinine up to 2.5 today, was in the 1.7 range in the past. On exam, he remains significantly volume overloaded. SBP remains soft in 90s.  - I will place a PICC line to follow co-ox and CVP, suspect he will need milrinone gtt.  - Continue Lasix 120 mg IV bid for now, has had reasonable UOP so far, but creatinine is up to 2.5.  - Would hold off on metolazone pending PICC placement and co-ox evaluation.  - Can continue spironolactone for now but watch renal function and BP closely.  - Continue digoxin 0.125, level 0.9. Will need to decrease dose if creatinine rises further.  - Concerning situation.  HF worsening but think poor LVAD candidate with age, renal dysfunction, RV dysfunction.  Mitraclip may be a consideration.  2. Mitral regurgitation: Severe on last echo.  Prominent murmur on exam.  Would consider Mitraclip.  3. CAD: s/p CABG. Cath in 2/20 with patent grafts.   - No ASA with Eliquis use.  - Not on statin, will discuss atorvastatin with him (apparently had rash on Crestor).  4. Atrial fibrillation: Paroxysmal, he is in NSR.   - Continue Eliquis.  5. 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 As above, likely cardiorenal. Needs diuresis.  - Place PICC, will start milrinone if co-ox low.  6. Type 2 DM: Jardiance on hold with low GFR.   - Continue SSI.  7. H/o recurrent syncope: none recently.   Length  of Stay: 1  Loralie Champagne, MD  04/25/2019, 11:23 AM  Advanced Heart Failure Team Pager [phone] (M-F; 7a - 4p)  Please contact [facility] for night-coverage after hours (4p -7a ) and weekends on [email or web address]

## 2019-04-25 NOTE — Evaluation (Signed)
Physical Therapy Evaluation Patient Details Name: Mason Arnold MRN: [id number] DOB: 21-Nov-1941 Today's Date: 04/25/2019   History of Present Illness  78 y.o. male admitted with edema, volume overload and CHf exacerbation. PMHx: CAD s/p CABG in 2000, ischemic cardiomyopathy with EF of 20% with St. Jude CRT-D, paroxysmal atrial fibrillation on Eliquis, gout, hypertension, hyperlipidemia, and DM  Clinical Impression  Pt very pleasant and reports increased instability for the last few days to the point he was running into objects and fell into the stereo. Pt started using RW on his own due to this change in balance. Pt with good stability with use of RW other than hitting a few obstacles with the wheels. Pt would benefit from acute therapy to maximize balance for independence with gait but do not anticipate needs outside the acute setting. Pt educated for walking program and HEp for home use. Pt reports walking and being active at the gym prior to Covid 19 and since that time has been increasingly sedentary.   SpO2 on 3L at rest 98%, 96% on 2L with gait HR 65-70 during activity    Follow Up Recommendations No PT follow up    Equipment Recommendations  None recommended by PT    Recommendations for Other Services       Precautions / Restrictions Precautions Precautions: Fall Restrictions Weight Bearing Restrictions: No      Mobility  Bed Mobility Overal bed mobility: Modified Independent                Transfers Overall transfer level: Modified independent                  Ambulation/Gait Ambulation/Gait assistance: Supervision Gait Distance (Feet): 400 Feet Assistive device: Rolling walker (2 wheeled) Gait Pattern/deviations: Step-through pattern   Gait velocity interpretation: >4.37 ft/sec, indicative of normal walking speed General Gait Details: pt with good technique with RW, speed and stability. Pt required VC x 3 due to not clearing objects with RW but was  able to correct  Stairs            Wheelchair Mobility    Modified Rankin (Stroke Patients Only)       Balance Overall balance assessment: Mild deficits observed, not formally tested;History of Falls                                           Pertinent Vitals/Pain Pain Assessment: No/denies pain    Home Living Family/patient expects to be discharged to:: Private residence Living Arrangements: Spouse/significant other Available Help at Discharge: Family;Available 24 hours/day Type of Home: House Home Access: Stairs to enter   [employer] of Steps: 5 Home Layout: One level Home Equipment: Walker - 2 wheels;Cane - single point;Grab bars - tub/shower      Prior Function Level of Independence: Independent         Comments: pt just started using RW a few days ago due to instability and falls at home     Hand Dominance        Extremity/Trunk Assessment   Upper Extremity Assessment Upper Extremity Assessment: Overall WFL for tasks assessed    Lower Extremity Assessment Lower Extremity Assessment: Overall WFL for tasks assessed    Cervical / Trunk Assessment Cervical / Trunk Assessment: Normal(pt with noted edema in trunk)  Communication   Communication: No difficulties  Cognition Arousal/Alertness: Awake/alert Behavior During Therapy: [facility]  for tasks assessed/performed Overall Cognitive Status: Within Functional Limits for tasks assessed                                        General Comments      Exercises General Exercises - Lower Extremity Long Arc Quad: AROM;15 reps;Seated;Both Hip Flexion/Marching: AROM;15 reps;Seated;Both   Assessment/Plan    PT Assessment Patient needs continued PT services  PT Problem List Decreased strength;Decreased balance;Decreased cognition;Decreased activity tolerance;Decreased safety awareness;Decreased knowledge of use of DME       PT Treatment Interventions DME  instruction;Functional mobility training;Balance training;Patient/family education;Gait training;Therapeutic activities;Stair training;Therapeutic exercise    PT Goals (Current goals can be found in the Care Plan section)  Acute Rehab PT Goals Patient Stated Goal: return home and be more active PT Goal Formulation: With patient Time For Goal Achievement: 05/09/19 Potential to Achieve Goals: Good    Frequency Min 3X/week   Barriers to discharge        Co-evaluation               AM-PAC PT "6 Clicks" Mobility  Outcome Measure Help needed turning from your back to your side while in a flat bed without using bedrails?: None Help needed moving from lying on your back to sitting on the side of a flat bed without using bedrails?: None Help needed moving to and from a bed to a chair (including a wheelchair)?: None Help needed standing up from a chair using your arms (e.g., wheelchair or bedside chair)?: None Help needed to walk in hospital room?: A Little Help needed climbing 3-5 steps with a railing? : A Little 6 Click Score: 22    End of Session   Activity Tolerance: Patient tolerated treatment well Patient left: in chair;with call bell/phone within reach;with chair alarm set Nurse Communication: Mobility status PT Visit Diagnosis: Other abnormalities of gait and mobility (R26.89)    Time: 1240-1301 PT Time Calculation (min) (ACUTE ONLY): 21 min   Charges:   PT Evaluation $PT Eval Moderate Complexity: 1 Mod          [place], PT Acute Rehabilitation Services Pager: [phone] Office: [place] 04/25/2019, 1:41 PM

## 2019-04-25 NOTE — Progress Notes (Signed)
Peripherally Inserted Central Catheter/Midline Placement  The IV Nurse has discussed with the patient and/or persons authorized to consent for the patient, the purpose of this procedure and the potential benefits and risks involved with this procedure.  The benefits include less needle sticks, lab draws from the catheter, and the patient may be discharged home with the catheter. Risks include, but not limited to, infection, bleeding, blood clot (thrombus formation), and puncture of an artery; nerve damage and irregular heartbeat and possibility to perform a PICC exchange if needed/ordered by physician.  Alternatives to this procedure were also discussed.  Bard Power PICC patient education guide, fact sheet on infection prevention and patient information card has been provided to patient /or left at bedside.    PICC/Midline Placement Documentation  PICC Double Lumen 69/45/03 PICC Right Basilic 39 cm (Active)  Indication for Insertion or Continuance of Line Vasoactive infusions 04/25/2019  5:00 PM  Site Assessment Clean;Dry;Intact 04/25/2019  5:00 PM  Lumen #1 Status Flushed;Blood return noted 04/25/2019  5:00 PM  Lumen #2 Status Flushed;Blood return noted 04/25/2019  5:00 PM  Dressing Type Transparent 04/25/2019  5:00 PM  Dressing Status Clean;Dry;Intact;Antimicrobial disc in place 04/25/2019  5:00 PM  Dressing Change Due 05/02/19 04/25/2019  5:00 PM       Jule Economy Horton 04/25/2019, 5:44 PM

## 2019-04-25 NOTE — TOC Initial Note (Signed)
Transition of Care [facility]) - Initial/Assessment Note    Patient Details  Name: Mason Arnold MRN: [id number] Date of Birth: 1940-12-31  Transition of Care [facility]) CM/SW Contact:    Sherrilyn Rist Phone Number: [phone] 04/25/2019, 10:51 AM  Clinical Narrative:                 Patient lives at home with spouse; goes to the [place] for medical care/ Dr Sherral Hammers; he use their pharmacy; back up pharmacy to the [place] is [facility] on [address]; has home oxygen through [employer]; CM talked to patient about [facility] services; patient stated that he had [facility] but does not want any HHC at this time. DME - patient stated " I have everything I need at home." CM will continue to follow for progression of care.  Expected Discharge Plan: Home/Self Care Barriers to Discharge: No Barriers Identified   Patient Goals and CMS Choice Patient states their goals for this hospitalization and ongoing recovery are:: to stay at home CMS Medicare.gov Compare Post Acute Care list provided to:: Patient Choice offered to / list presented to : Patient  Expected Discharge Plan and Services Expected Discharge Plan: Home/Self Care In-house Referral: [facility] program for CHF/ telephonic) Discharge Planning Services: CM Consult Post Acute Care Choice: NA Living arrangements for the past 2 months: Single Family Home                 DME Arranged: N/A DME Agency: NA       HH Arranged: Refused [place] Agency: NA        Prior Living Arrangements/Services Living arrangements for the past 2 months: Single Family Home Lives with:: Spouse          Need for Family Participation in Patient Care: No (Comment) Care giver support system in place?: Yes (comment)   Criminal Activity/Legal Involvement Pertinent to Current Situation/Hospitalization: No - Comment as needed  Activities of Daily Living Home Assistive Devices/Equipment: Grab bars in shower, Shower chair without back, Eyeglasses,  Oxygen, Raised toilet seat with rails, Walker (specify type)(front wheel walker) ADL Screening (condition at time of admission) Patient's cognitive ability adequate to safely complete daily activities?: Yes Is the patient deaf or have difficulty hearing?: No Does the patient have difficulty seeing, even when wearing glasses/contacts?: No Does the patient have difficulty concentrating, remembering, or making decisions?: No Patient able to express need for assistance with ADLs?: Yes Does the patient have difficulty dressing or bathing?: No Independently performs ADLs?: Yes (appropriate for developmental age) Does the patient have difficulty walking or climbing stairs?: No Weakness of Legs: None Weakness of Arms/Hands: None  Permission Sought/Granted Permission sought to share information with : Case Manager Permission granted to share information with : Yes, Verbal Permission Granted              Emotional Assessment Appearance:: Developmentally appropriate Attitude/Demeanor/Rapport: Gracious Affect (typically observed): Accepting Orientation: : Oriented to Self, Oriented to  Time, Oriented to Place, Oriented to Situation Alcohol / Substance Use: Not Applicable Psych Involvement: No (comment)  Admission diagnosis:  Acute on chronic systolic congestive heart failure ([place]) [I50.23] Patient Active Problem List   Diagnosis Date Noted  . Hypoxia, secondary to heart failure 02/16/2019  . Acute on chronic systolic heart failure ([place]) 02/13/2019  . Systolic CHF ([place]) 73/41/9379  . Syncope 06/29/2013  . Ventricular tachycardia ([place]) 06/29/2013  . Depression 11/30/2012  . 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 Atrial fibrillation (  [place]) 02/03/2011  . Chronic systolic heart failure ([place]) 10/01/2010  . Coronary atherosclerosis 06/20/2010  . Cardiomyopathy, ischemic 06/12/2009  . HYPERLIPIDEMIA 02/11/2008  . ORTHOSTATIC HYPOTENSION  02/11/2008  . Diabetes ([place]) 10/18/2007  . GOUT 03/26/2007   PCP:  Binnie Rail, MD Pharmacy:   [facility] # [address] [place] [address] [place] [address] Phone: [phone] Fax: [phone]  [place], [place] [address] Phone: [phone] Fax: [phone]     Social Determinants of Health (SDOH) Interventions    Readmission Risk Interventions No flowsheet data found.

## 2019-04-26 LAB — BASIC METABOLIC PANEL
Anion gap: 15 (ref 5–15)
Anion gap: 13 (ref 5–15)
BUN: 74 mg/dL — ABNORMAL HIGH (ref 8–23)
BUN: 67 mg/dL — ABNORMAL HIGH (ref 8–23)
CO2: 31 mmol/L (ref 22–32)
CO2: 31 mmol/L (ref 22–32)
Calcium: 9 mg/dL (ref 8.9–10.3)
Calcium: 9.4 mg/dL (ref 8.9–10.3)
Chloride: 84 mmol/L — ABNORMAL LOW (ref 98–111)
Chloride: 86 mmol/L — ABNORMAL LOW (ref 98–111)
Creatinine, Ser: 1.92 mg/dL — ABNORMAL HIGH (ref 0.61–1.24)
Creatinine, Ser: 1.91 mg/dL — ABNORMAL HIGH (ref 0.61–1.24)
GFR calc Af Amer: 38 mL/min — ABNORMAL LOW (ref >60)
GFR calc Af Amer: 38 mL/min — ABNORMAL LOW (ref >60)
GFR calc non Af Amer: 32 mL/min — ABNORMAL LOW (ref >60)
GFR calc non Af Amer: 33 mL/min — ABNORMAL LOW (ref >60)
Glucose, Bld: 118 mg/dL — ABNORMAL HIGH (ref 70–99)
Glucose, Bld: 196 mg/dL — ABNORMAL HIGH (ref 70–99)
Potassium: 3.1 mmol/L — ABNORMAL LOW (ref 3.5–5.1)
Potassium: 3.9 mmol/L (ref 3.5–5.1)
Sodium: 130 mmol/L — ABNORMAL LOW (ref 135–145)
Sodium: 130 mmol/L — ABNORMAL LOW (ref 135–145)

## 2019-04-26 LAB — GLUCOSE, CAPILLARY
Glucose-Capillary: 117 mg/dL — ABNORMAL HIGH (ref 70–99)
Glucose-Capillary: 176 mg/dL — ABNORMAL HIGH (ref 70–99)
Glucose-Capillary: 159 mg/dL — ABNORMAL HIGH (ref 70–99)

## 2019-04-26 LAB — COOXEMETRY PANEL
Carboxyhemoglobin: 1.9 % — ABNORMAL HIGH (ref 0.5–1.5)
Methemoglobin: 1 % (ref 0.0–1.5)
O2 Saturation: 69.1 %
Total hemoglobin: 13.6 g/dL (ref 12.0–16.0)

## 2019-04-26 LAB — MAGNESIUM: Magnesium: 2.2 mg/dL (ref 1.7–2.4)

## 2019-04-26 MED ORDER — POTASSIUM CHLORIDE CRYS ER 20 MEQ PO TBCR
40.0000 meq | EXTENDED_RELEASE_TABLET | Freq: Once | ORAL | Status: AC
  Administered 2019-04-26: 08:00:00 40 meq via ORAL
  Filled 2019-04-26: qty 2

## 2019-04-26 MED ORDER — METOLAZONE 5 MG PO TABS
2.5000 mg | ORAL_TABLET | Freq: Once | ORAL | Status: AC
  Administered 2019-04-26: 2.5 mg via ORAL
  Filled 2019-04-26: qty 1

## 2019-04-26 NOTE — Progress Notes (Signed)
Patient ID: Mason Arnold, male   DOB: 10-May-1941, 78 y.o.   MRN: [id number]     Advanced Heart Failure Rounding Note  PCP-Cardiologist: No primary care provider on file.   Subjective:    He diuresed well yesterday, weight down 4 lbs.  PICC placed, co-ox 69% this morning.  Creatinine 2.3 => 2.5 => 1.92.  Short of breath overnight, better this morning. CVP remains 19-20.  SBP 100s.    Objective:   Weight Range: 73.5 kg Body mass index is 26.96 kg/m.   Vital Signs:   Temp:  [97.5 F (36.4 C)-98.2 F (36.8 C)] 97.5 F (36.4 C) (05/19 0751) Pulse Rate:  [64-71] 69 (05/19 0754) Resp:  [18-22] 19 (05/19 0751) BP: (97-110)/(64-77) 104/72 (05/19 0751) SpO2:  [95 %-100 %] 100 % (05/19 0751) Weight:  [73.5 kg] 73.5 kg (05/19 0340) Last BM Date: 04/25/19  Weight change: Filed Weights   04/24/19 1435 04/25/19 0513 04/26/19 0340  Weight: 74.5 kg 75.7 kg 73.5 kg    Intake/Output:   Intake/Output Summary (Last 24 hours) at 04/26/2019 0838 Last data filed at 04/26/2019 0700 Gross per 24 hour  Intake 910 ml  Output 2925 ml  Net -2015 ml      Physical Exam    General: NAD Neck: JVP 16 cm, no thyromegaly or thyroid nodule.  Lungs: Clear to auscultation bilaterally with normal respiratory effort. CV: Nondisplaced PMI.  Heart regular S1/S2, +S3, 2/6 HSM apex.  No peripheral edema.   Abdomen: Soft, nontender, no hepatosplenomegaly, no distention.  Skin: Intact without lesions or rashes.  Neurologic: Alert and oriented x 3.  Psych: Normal affect. Extremities: No clubbing or cyanosis.  HEENT: Normal.    Telemetry   NSR with BiV pacing (personally reviewed)  Labs    CBC Recent Labs    04/24/19 1129  WBC 9.6  HGB 14.1  HCT 41.1  MCV 93.0  PLT 226   Basic Metabolic Panel Recent Labs    04/24/19 1449  04/25/19 0930 04/26/19 0448  NA  --    < > 129* 130*  K  --    < > 4.2 3.1*  CL  --    < > 86* 84*  CO2  --    < > 30 31  GLUCOSE  --    < > 193* 118*  BUN  --     < > 80* 74*  CREATININE  --    < > 2.52* 1.92*  CALCIUM  --    < > 9.4 9.0  MG 2.9*  --   --  2.2   < > = values in this interval not displayed.   Liver Function Tests No results for input(s): AST, ALT, ALKPHOS, BILITOT, PROT, ALBUMIN in the last 72 hours. No results for input(s): LIPASE, AMYLASE in the last 72 hours. Cardiac Enzymes Recent Labs    04/24/19 1129  TROPONINI 0.06*    BNP: BNP (last 3 results) Recent Labs    02/02/19 1500 04/22/19 1330 04/24/19 1449  BNP 3,871.3* >4,500.0* 3,264.2*    ProBNP (last 3 results) No results for input(s): PROBNP in the last 8760 hours.   D-Dimer No results for input(s): DDIMER in the last 72 hours. Hemoglobin A1C No results for input(s): HGBA1C in the last 72 hours. Fasting Lipid Panel No results for input(s): CHOL, HDL, LDLCALC, TRIG, CHOLHDL, LDLDIRECT in the last 72 hours. Thyroid Function Tests No results for input(s): TSH, T4TOTAL, T3FREE, THYROIDAB in the last 72 hours.  Invalid  input(s): FREET3  Other results:   Imaging    [place] Ekg Site Rite  Result Date: 04/25/2019 If Site Rite image not attached, placement could not be confirmed due to current cardiac rhythm.    Medications:     Scheduled Medications: . allopurinol  100 mg Oral QPM  . apixaban  5 mg Oral BID  . digoxin  125 mcg Oral Daily  . famotidine  10 mg Oral QAC supper  . FLUoxetine  20 mg Oral Daily  . insulin aspart  0-9 Units Subcutaneous TID WC  . insulin glargine  5 Units Subcutaneous QHS  . metolazone  2.5 mg Oral Once  . potassium chloride  40 mEq Oral BID  . simvastatin  20 mg Oral q1800  . sodium chloride flush  10-40 mL Intracatheter Q12H  . spironolactone  25 mg Oral QPM    Infusions: . furosemide 120 mg (04/25/19 1838)    PRN Medications: albuterol, loratadine, nitroGLYCERIN, ondansetron (ZOFRAN) IV, polyvinyl alcohol, sodium chloride flush   Assessment/Plan   1. Acute on chronic systolic CHF: Echo (0/05) with EF  20-25%, severely decreased RV systolic function, severe MR. St Jude CRT-D.  Ischemic cardiomyopathy with significant co-existing RV failure.  This is now complicated as well by cardiorenal syndrome.  Creatinine initially up to 2.5, was in the 1.7 range in the past. Today creatinine 1.92.  Co-ox adequate at 69%, CVP high 19-20.  On exam, he remains significantly volume overloaded. SBP 100s.   - Hold off on milrinone for now.  - Continue Lasix 120 mg IV bid and will give a dose of metolazone 2.5 x 1 again today.  - Can continue spironolactone for now but watch renal function and BP closely.  - Continue digoxin 0.125, level 0.9. Will need to decrease dose if creatinine rises further.  - Concerning situation.  HF worsening but think poor LVAD candidate with age, renal dysfunction, RV dysfunction.  Mitraclip may be a consideration.  2. Mitral regurgitation: Severe on last echo.  Prominent murmur on exam.  Would consider Mitraclip.  3. CAD: s/p CABG. Cath in 2/20 with patent grafts.   - No ASA with Eliquis use.  - Continue simvastatin.   4. Atrial fibrillation: Paroxysmal, he is in NSR.   - Continue Eliquis.  5. 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  As above, likely cardiorenal. Needs diuresis, hopefully creatinine will continue to improve with fall in renal venous pressure.  6. Type 2 DM: Jardiance on hold with low GFR.   - Continue SSI.  7. H/o recurrent syncope: none recently.   Length of Stay: 2  Loralie Champagne, MD  04/26/2019, 8:38 AM  Advanced Heart Failure Team Pager [phone] (M-F; 7a - 4p)  Please contact [facility] for night-coverage after hours (4p -7a ) and weekends on [email or web address]

## 2019-04-26 NOTE — Progress Notes (Signed)
Physical Therapy Treatment Patient Details Name: Mason Arnold MRN: [id number] DOB: 21-Aug-1941 Today's Date: 04/26/2019    History of Present Illness 78 y.o. male admitted with edema, volume overload and CHf exacerbation. PMHx: CAD s/p CABG in 2000, ischemic cardiomyopathy with EF of 20% with St. Jude CRT-D, paroxysmal atrial fibrillation on Eliquis, gout, hypertension, hyperlipidemia, and DM    PT Comments    Pt very pleasant sitting EOB finishing lunch on arrival. Pt reports walking earlier today without difficulty but noted wheezing and DOE 2/4 today without use of RW. Pt with SPO2 95-97% on 3L during activity with HR 77. Pt able to perform stairs but will continue to benefit from acute therapy to increase endurance and balance.     Follow Up Recommendations  No PT follow up     Equipment Recommendations  None recommended by PT    Recommendations for Other Services       Precautions / Restrictions Precautions Precautions: Fall    Mobility  Bed Mobility               General bed mobility comments: EOB on arrival  Transfers Overall transfer level: Modified independent                  Ambulation/Gait Ambulation/Gait assistance: Supervision Gait Distance (Feet): 250 Feet Assistive device: None Gait Pattern/deviations: Step-through pattern;Decreased stride length   Gait velocity interpretation: >2.62 ft/sec, indicative of community ambulatory General Gait Details: Performed gait without RW this session with noted instability but no overt LOB. Gait limited by not having shoes on and fatigue.    Stairs Stairs: Yes Stairs assistance: Supervision Stair Management: Alternating pattern;Forwards;One rail Left Number of Stairs: 3 General stair comments: pt able to ambulate 3 stairs with one rail, limited by IV length, good stability with rail    Wheelchair Mobility    Modified Rankin (Stroke Patients Only)       Balance Overall balance assessment:  Mild deficits observed, not formally tested;History of Falls                                          Cognition Arousal/Alertness: Awake/alert Behavior During Therapy: WFL for tasks assessed/performed Overall Cognitive Status: Within Functional Limits for tasks assessed                                        Exercises      General Comments        Pertinent Vitals/Pain Pain Assessment: No/denies pain    Home Living                      Prior Function            PT Goals (current goals can now be found in the care plan section) Progress towards PT goals: Progressing toward goals    Frequency           PT Plan Current plan remains appropriate    Co-evaluation              AM-PAC PT "6 Clicks" Mobility   Outcome Measure  Help needed turning from your back to your side while in a flat bed without using bedrails?: None Help needed moving from lying on your back to sitting on the side of  a flat bed without using bedrails?: None Help needed moving to and from a bed to a chair (including a wheelchair)?: None Help needed standing up from a chair using your arms (e.g., wheelchair or bedside chair)?: None Help needed to walk in hospital room?: A Little Help needed climbing 3-5 steps with a railing? : A Little 6 Click Score: 22    End of Session Equipment Utilized During Treatment: Gait belt;Oxygen Activity Tolerance: Patient tolerated treatment well Patient left: in chair;with call bell/phone within reach Nurse Communication: Mobility status PT Visit Diagnosis: Other abnormalities of gait and mobility (R26.89)     Time: 2417-5301 PT Time Calculation (min) (ACUTE ONLY): 22 min  Charges:  $Gait Training: 8-22 mins                     Smyth, PT Acute Rehabilitation Services Pager: [phone] Office: [place] 04/26/2019, 1:56 PM

## 2019-04-26 NOTE — Progress Notes (Signed)
Pt ambulated with RN down hall. HR remained in the 70s, O2 sat. remained >95% on 3lL. Assesed labored breathing while ambulating. No complaints voiced, n distress noted.

## 2019-04-27 ENCOUNTER — Encounter (HOSPITAL_COMMUNITY): Payer: Self-pay | Admitting: *Deleted

## 2019-04-27 DIAGNOSIS — I130 Hypertensive heart and chronic kidney disease with heart failure and stage 1 through stage 4 chronic kidney disease, or unspecified chronic kidney disease: Principal | ICD-10-CM

## 2019-04-27 LAB — BASIC METABOLIC PANEL
Anion gap: 13 (ref 5–15)
BUN: 69 mg/dL — ABNORMAL HIGH (ref 8–23)
CO2: 33 mmol/L — ABNORMAL HIGH (ref 22–32)
Calcium: 9 mg/dL (ref 8.9–10.3)
Chloride: 85 mmol/L — ABNORMAL LOW (ref 98–111)
Creatinine, Ser: 1.65 mg/dL — ABNORMAL HIGH (ref 0.61–1.24)
GFR calc Af Amer: 45 mL/min — ABNORMAL LOW (ref >60)
GFR calc non Af Amer: 39 mL/min — ABNORMAL LOW (ref >60)
Glucose, Bld: 129 mg/dL — ABNORMAL HIGH (ref 70–99)
Potassium: 3 mmol/L — ABNORMAL LOW (ref 3.5–5.1)
Sodium: 131 mmol/L — ABNORMAL LOW (ref 135–145)

## 2019-04-27 LAB — COOXEMETRY PANEL
Carboxyhemoglobin: 2.2 % — ABNORMAL HIGH (ref 0.5–1.5)
Carboxyhemoglobin: 1.7 % — ABNORMAL HIGH (ref 0.5–1.5)
Methemoglobin: 0.9 % (ref 0.0–1.5)
Methemoglobin: 1.5 % (ref 0.0–1.5)
O2 Saturation: 95.8 %
O2 Saturation: 66.4 %
Total hemoglobin: 13.6 g/dL (ref 12.0–16.0)
Total hemoglobin: 13.1 g/dL (ref 12.0–16.0)

## 2019-04-27 LAB — GLUCOSE, CAPILLARY
Glucose-Capillary: 224 mg/dL — ABNORMAL HIGH (ref 70–99)
Glucose-Capillary: 224 mg/dL — ABNORMAL HIGH (ref 70–99)
Glucose-Capillary: 116 mg/dL — ABNORMAL HIGH (ref 70–99)
Glucose-Capillary: 160 mg/dL — ABNORMAL HIGH (ref 70–99)
Glucose-Capillary: 187 mg/dL — ABNORMAL HIGH (ref 70–99)
Glucose-Capillary: 127 mg/dL — ABNORMAL HIGH (ref 70–99)

## 2019-04-27 MED ORDER — POTASSIUM CHLORIDE CRYS ER 20 MEQ PO TBCR
40.0000 meq | EXTENDED_RELEASE_TABLET | Freq: Once | ORAL | Status: AC
  Administered 2019-04-27: 40 meq via ORAL
  Filled 2019-04-27: qty 2

## 2019-04-27 MED ORDER — METOLAZONE 5 MG PO TABS
5.0000 mg | ORAL_TABLET | Freq: Once | ORAL | Status: AC
  Administered 2019-04-27: 5 mg via ORAL
  Filled 2019-04-27: qty 1

## 2019-04-27 NOTE — Progress Notes (Signed)
Patient ID: Mason Arnold, male   DOB: 11-Jul-1941, 78 y.o.   MRN: [id number]     Advanced Heart Failure Rounding Note  PCP-Cardiologist: No primary care provider on file.   Subjective:    He diuresed well again yesterday but CVP still high, >20. Co-ox 66%.  Creatinine 2.3 => 2.5 => 1.92 => 1.65. No dyspnea at rest.    Objective:   Weight Range: 73.4 kg Body mass index is 26.92 kg/m.   Vital Signs:   Temp:  [97.5 F (36.4 C)-98.2 F (36.8 C)] 98 F (36.7 C) (05/20 0700) Pulse Rate:  [60-78] 78 (05/20 0838) Resp:  [11-21] 11 (05/20 0600) BP: (87-105)/(62-75) 98/73 (05/20 0700) SpO2:  [90 %-99 %] 95 % (05/20 0600) Weight:  [73.4 kg] 73.4 kg (05/20 0600) Last BM Date: 04/26/19  Weight change: Filed Weights   04/25/19 0513 04/26/19 0340 04/27/19 0600  Weight: 75.7 kg 73.5 kg 73.4 kg    Intake/Output:   Intake/Output Summary (Last 24 hours) at 04/27/2019 1129 Last data filed at 04/27/2019 0800 Gross per 24 hour  Intake 1380 ml  Output 3295 ml  Net -1915 ml      Physical Exam    General: NAD Neck: JVP 16+ cm, no thyromegaly or thyroid nodule.  Lungs: Clear to auscultation bilaterally with normal respiratory effort. CV: Lateral PMI.  Heart regular S1/S2, no S3/S4, 2/6 HSM apex  No peripheral edema.   Abdomen: Soft, nontender, no hepatosplenomegaly, no distention.  Skin: Intact without lesions or rashes.  Neurologic: Alert and oriented x 3.  Psych: Normal affect. Extremities: No clubbing or cyanosis.  HEENT: Normal.    Telemetry   NSR with BiV pacing (personally reviewed)  Labs    CBC No results for input(s): WBC, NEUTROABS, HGB, HCT, MCV, PLT in the last 72 hours. Basic Metabolic Panel Recent Labs    04/24/19 1449  04/26/19 0448 04/26/19 1423 04/27/19 0500  NA  --    < > 130* 130* 131*  K  --    < > 3.1* 3.9 3.0*  CL  --    < > 84* 86* 85*  CO2  --    < > 31 31 33*  GLUCOSE  --    < > 118* 196* 129*  BUN  --    < > 74* 67* 69*  CREATININE  --     < > 1.92* 1.91* 1.65*  CALCIUM  --    < > 9.0 9.4 9.0  MG 2.9*  --  2.2  --   --    < > = values in this interval not displayed.   Liver Function Tests No results for input(s): AST, ALT, ALKPHOS, BILITOT, PROT, ALBUMIN in the last 72 hours. No results for input(s): LIPASE, AMYLASE in the last 72 hours. Cardiac Enzymes No results for input(s): CKTOTAL, CKMB, CKMBINDEX, TROPONINI in the last 72 hours.  BNP: BNP (last 3 results) Recent Labs    02/02/19 1500 04/22/19 1330 04/24/19 1449  BNP 3,871.3* >4,500.0* 3,264.2*    ProBNP (last 3 results) No results for input(s): PROBNP in the last 8760 hours.   D-Dimer No results for input(s): DDIMER in the last 72 hours. Hemoglobin A1C No results for input(s): HGBA1C in the last 72 hours. Fasting Lipid Panel No results for input(s): CHOL, HDL, LDLCALC, TRIG, CHOLHDL, LDLDIRECT in the last 72 hours. Thyroid Function Tests No results for input(s): TSH, T4TOTAL, T3FREE, THYROIDAB in the last 72 hours.  Invalid input(s): FREET3  Other results:  Imaging    No results found.   Medications:     Scheduled Medications: . allopurinol  100 mg Oral QPM  . apixaban  5 mg Oral BID  . digoxin  125 mcg Oral Daily  . famotidine  10 mg Oral QAC supper  . FLUoxetine  20 mg Oral Daily  . insulin aspart  0-9 Units Subcutaneous TID WC  . insulin glargine  5 Units Subcutaneous QHS  . potassium chloride  40 mEq Oral BID  . simvastatin  20 mg Oral q1800  . sodium chloride flush  10-40 mL Intracatheter Q12H  . spironolactone  25 mg Oral QPM    Infusions: . furosemide 120 mg (04/27/19 0928)    PRN Medications: albuterol, loratadine, nitroGLYCERIN, ondansetron (ZOFRAN) IV, polyvinyl alcohol, sodium chloride flush   Assessment/Plan   1. Acute on chronic systolic CHF: Echo (7/93) with EF 20-25%, severely decreased RV systolic function, severe MR. St Jude CRT-D.  Ischemic cardiomyopathy with significant co-existing RV failure.  This is  now complicated as well by cardiorenal syndrome.  Creatinine initially up to 2.5, was in the 1.7 range in the past. Today creatinine 1.65.  Co-ox adequate at 66%, CVP still >20 though he diuresed well again yesterday.  On exam, he remains significantly volume overloaded. SBP 90s..   - Hold off on milrinone for now with good co-ox and creatinine decreasing.  - Continue Lasix 120 mg IV bid and will give metolazone 5 mg bid today.  Replace K aggressively, discussed with pharmacy.  - Can continue spironolactone for now but watch renal function and BP closely.  - Continue digoxin 0.125, level 0.9.   - Concerning situation.  HF worsening but think poor LVAD candidate with age, renal dysfunction, RV dysfunction.  Mitraclip may be a consideration.  2. Mitral regurgitation: Severe on last echo.  Prominent murmur on exam.  Would consider Mitraclip.  3. CAD: s/p CABG. Cath in 2/20 with patent grafts.   - No ASA with Eliquis use.  - Continue simvastatin.   4. Atrial fibrillation: Paroxysmal, he is in NSR.   - Continue Eliquis.  5. 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  As above, likely cardiorenal. Needs diuresis, hopefully creatinine will continue to improve with fall in renal venous pressure.  6. Type 2 DM: Jardiance on hold with low GFR.   - Continue SSI.  7. H/o recurrent syncope: none recently.   Length of Stay: 3  Loralie Champagne, MD  04/27/2019, 11:29 AM  Advanced Heart Failure Team Pager [phone] (M-F; 7a - 4p)  Please contact [facility] for night-coverage after hours (4p -7a ) and weekends on [email or web address]

## 2019-04-28 ENCOUNTER — Encounter (HOSPITAL_COMMUNITY): Payer: Self-pay

## 2019-04-28 LAB — BASIC METABOLIC PANEL
Anion gap: 13 (ref 5–15)
BUN: 66 mg/dL — ABNORMAL HIGH (ref 8–23)
CO2: 32 mmol/L (ref 22–32)
Calcium: 9.3 mg/dL (ref 8.9–10.3)
Chloride: 87 mmol/L — ABNORMAL LOW (ref 98–111)
Creatinine, Ser: 1.56 mg/dL — ABNORMAL HIGH (ref 0.61–1.24)
GFR calc Af Amer: 48 mL/min — ABNORMAL LOW (ref >60)
GFR calc non Af Amer: 42 mL/min — ABNORMAL LOW (ref >60)
Glucose, Bld: 124 mg/dL — ABNORMAL HIGH (ref 70–99)
Potassium: 3.2 mmol/L — ABNORMAL LOW (ref 3.5–5.1)
Sodium: 132 mmol/L — ABNORMAL LOW (ref 135–145)

## 2019-04-28 LAB — COOXEMETRY PANEL
Carboxyhemoglobin: 2 % — ABNORMAL HIGH (ref 0.5–1.5)
Methemoglobin: 1 % (ref 0.0–1.5)
O2 Saturation: 72.3 %
Total hemoglobin: 13.2 g/dL (ref 12.0–16.0)

## 2019-04-28 LAB — GLUCOSE, CAPILLARY
Glucose-Capillary: 110 mg/dL — ABNORMAL HIGH (ref 70–99)
Glucose-Capillary: 183 mg/dL — ABNORMAL HIGH (ref 70–99)
Glucose-Capillary: 143 mg/dL — ABNORMAL HIGH (ref 70–99)
Glucose-Capillary: 130 mg/dL — ABNORMAL HIGH (ref 70–99)

## 2019-04-28 MED ORDER — POTASSIUM CHLORIDE CRYS ER 20 MEQ PO TBCR
40.0000 meq | EXTENDED_RELEASE_TABLET | ORAL | Status: AC
  Administered 2019-04-28 – 2019-04-29 (×5): 40 meq via ORAL
  Filled 2019-04-28 (×5): qty 2

## 2019-04-28 MED ORDER — POTASSIUM CHLORIDE CRYS ER 20 MEQ PO TBCR: 40.0000 meq | EXTENDED_RELEASE_TABLET | Freq: Two times a day (BID) | ORAL | Status: DC

## 2019-04-28 MED ORDER — FUROSEMIDE 10 MG/ML IJ SOLN
20.0000 mg/h | INTRAVENOUS | Status: DC
  Administered 2019-04-28 – 2019-04-29 (×3): 15 mg/h via INTRAVENOUS
  Administered 2019-04-30 – 2019-05-04 (×7): 20 mg/h via INTRAVENOUS
  Filled 2019-04-28: qty 21
  Filled 2019-04-28 (×3): qty 25
  Filled 2019-04-28: qty 21
  Filled 2019-04-28 (×2): qty 25
  Filled 2019-04-28 (×2): qty 20
  Filled 2019-04-28 (×2): qty 25
  Filled 2019-04-28: qty 20
  Filled 2019-04-28 (×2): qty 25

## 2019-04-28 MED ORDER — METOLAZONE 5 MG PO TABS
5.0000 mg | ORAL_TABLET | Freq: Two times a day (BID) | ORAL | Status: AC
  Administered 2019-04-28 (×2): 5 mg via ORAL
  Filled 2019-04-28 (×2): qty 1

## 2019-04-28 NOTE — Telephone Encounter (Signed)
Dr. Aundra Dubin has been rounding on patient. Will check with him

## 2019-04-28 NOTE — Progress Notes (Signed)
Patient ID: Mason Arnold, male   DOB: 01/31/1941, 78 y.o.   MRN: [id number]     Advanced Heart Failure Rounding Note  PCP-Cardiologist: No primary care provider on file.   Subjective:    He diuresed well again yesterday with weight down 2 lbs. CVP still high at 19 but starting to come down. Co-ox 72%.  Creatinine 2.3 => 2.5 => 1.92 => 1.65 => 1.56.  Was short of breath last night.    Objective:   Weight Range: 72.2 kg Body mass index is 26.48 kg/m.   Vital Signs:   Temp:  [97.5 F (36.4 C)-97.8 F (36.6 C)] 97.6 F (36.4 C) (05/20 2115) Pulse Rate:  [67-86] 70 (05/21 0856) Resp:  [15-18] 16 (05/20 2328) BP: (103-110)/(66-80) 110/70 (05/20 2328) SpO2:  [85 %-98 %] 97 % (05/20 2328) Weight:  [72.2 kg] 72.2 kg (05/21 0653) Last BM Date: 04/26/19  Weight change: Filed Weights   04/26/19 0340 04/27/19 0600 04/28/19 0653  Weight: 73.5 kg 73.4 kg 72.2 kg    Intake/Output:   Intake/Output Summary (Last 24 hours) at 04/28/2019 1032 Last data filed at 04/28/2019 0654 Gross per 24 hour  Intake 1610 ml  Output 3730 ml  Net -2120 ml      Physical Exam    General: NAD Neck: JVP 16 cm, no thyromegaly or thyroid nodule.  Lungs: Clear to auscultation bilaterally with normal respiratory effort. CV: Lateral PMI.  Heart regular S1/S2, no S3/S4, 2/6 HSM apex.  No peripheral edema.   Abdomen: Soft, nontender, no hepatosplenomegaly, no distention.  Skin: Intact without lesions or rashes.  Neurologic: Alert and oriented x 3.  Psych: Normal affect. Extremities: No clubbing or cyanosis.  HEENT: Normal.    Telemetry   NSR with BiV pacing (personally reviewed)  Labs    CBC No results for input(s): WBC, NEUTROABS, HGB, HCT, MCV, PLT in the last 72 hours. Basic Metabolic Panel Recent Labs    04/26/19 0448  04/27/19 0500 04/28/19 0331  NA 130*   < > 131* 132*  K 3.1*   < > 3.0* 3.2*  CL 84*   < > 85* 87*  CO2 31   < > 33* 32  GLUCOSE 118*   < > 129* 124*  BUN 74*   < >  69* 66*  CREATININE 1.92*   < > 1.65* 1.56*  CALCIUM 9.0   < > 9.0 9.3  MG 2.2  --   --   --    < > = values in this interval not displayed.   Liver Function Tests No results for input(s): AST, ALT, ALKPHOS, BILITOT, PROT, ALBUMIN in the last 72 hours. No results for input(s): LIPASE, AMYLASE in the last 72 hours. Cardiac Enzymes No results for input(s): CKTOTAL, CKMB, CKMBINDEX, TROPONINI in the last 72 hours.  BNP: BNP (last 3 results) Recent Labs    02/02/19 1500 04/22/19 1330 04/24/19 1449  BNP 3,871.3* >4,500.0* 3,264.2*    ProBNP (last 3 results) No results for input(s): PROBNP in the last 8760 hours.   D-Dimer No results for input(s): DDIMER in the last 72 hours. Hemoglobin A1C No results for input(s): HGBA1C in the last 72 hours. Fasting Lipid Panel No results for input(s): CHOL, HDL, LDLCALC, TRIG, CHOLHDL, LDLDIRECT in the last 72 hours. Thyroid Function Tests No results for input(s): TSH, T4TOTAL, T3FREE, THYROIDAB in the last 72 hours.  Invalid input(s): FREET3  Other results:   Imaging    No results found.   Medications:  Scheduled Medications: . allopurinol  100 mg Oral QPM  . apixaban  5 mg Oral BID  . digoxin  125 mcg Oral Daily  . famotidine  10 mg Oral QAC supper  . FLUoxetine  20 mg Oral Daily  . insulin aspart  0-9 Units Subcutaneous TID WC  . insulin glargine  5 Units Subcutaneous QHS  . metolazone  5 mg Oral BID  . potassium chloride  40 mEq Oral Q4H  . [START ON 04/29/2019] potassium chloride  40 mEq Oral BID  . simvastatin  20 mg Oral q1800  . sodium chloride flush  10-40 mL Intracatheter Q12H  . spironolactone  25 mg Oral QPM    Infusions: . furosemide (LASIX) infusion      PRN Medications: albuterol, loratadine, nitroGLYCERIN, ondansetron (ZOFRAN) IV, polyvinyl alcohol, sodium chloride flush   Assessment/Plan   1. Acute on chronic systolic CHF: Echo (2/70) with EF 20-25%, severely decreased RV systolic function,  severe MR. St Jude CRT-D.  Ischemic cardiomyopathy with significant co-existing RV failure.  This is now complicated as well by cardiorenal syndrome.  Creatinine initially up to 2.5, was in the 1.7 range in the past. Today creatinine 1.56.  Co-ox adequate at 72%, CVP still high at 19 but starting to come down.  On exam, he remains significantly volume overloaded. SBP 90s.   - Hold off on milrinone for now with good co-ox and creatinine decreasing.  - I will transition him to Lasix gtt at 15 mg/hr today (had 120 mg IV Lasix bolus).   Will also give him metolazone 5 mg bid.   - Can continue spironolactone for now but watch renal function and BP closely.  - Continue digoxin 0.125, level 0.9.   - Concerning situation.  HF worsening but think poor LVAD candidate with age, renal dysfunction, RV dysfunction.  Mitraclip may be a consideration.  2. Mitral regurgitation: Severe on last echo.  Prominent murmur on exam.  Would consider Mitraclip.  3. CAD: s/p CABG. Cath in 2/20 with patent grafts.   - No ASA with Eliquis use.  - Continue simvastatin.   4. Atrial fibrillation: Paroxysmal, he is in NSR.   - Continue Eliquis.  5. 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  As above, likely cardiorenal. Needs diuresis, hopefully creatinine will continue to improve with fall in renal venous pressure.  6. Type 2 DM: Jardiance on hold with low GFR.   - Continue SSI.  7. H/o recurrent syncope: none recently.   Length of Stay: [place], MD  04/28/2019, 10:32 AM  Advanced Heart Failure Team Pager [phone] (M-F; 7a - 4p)  Please contact [facility] for night-coverage after hours (4p -7a ) and weekends on [email or web address]

## 2019-04-28 NOTE — Progress Notes (Signed)
Physical Therapy Treatment Patient Details Name: Mason Arnold MRN: [id number] DOB: [birth year] Today's Date: 04/28/2019    History of Present Illness 78 y.o. male admitted with edema, volume overload and CHf exacerbation. PMHx: CAD s/p CABG in 2000, ischemic cardiomyopathy with EF of 20% with St. Jude CRT-D, paroxysmal atrial fibrillation on Eliquis, gout, hypertension, hyperlipidemia, and DM    PT Comments    Pt pleasant and very willing to mobilize today. Pt requested wearing shoes today but with noted increase in instability which pt then contributed to shoes, will wear only gripper socks next session.   Pt on 2L with SPO2 93% for 175' then dropped to 88%. On 3L maintaining 95-98% with gait  Hr 74-76   Follow Up Recommendations  No PT follow up     Equipment Recommendations  None recommended by PT    Recommendations for Other Services       Precautions / Restrictions Precautions Precautions: Fall    Mobility  Bed Mobility Overal bed mobility: Modified Independent             General bed mobility comments: HOB 30 degrees with use of rail   Transfers Overall transfer level: Needs assistance   Transfers: Sit to/from Stand Sit to Stand: Min guard         General transfer comment: guarding for safety as pt with increased instability this session   Ambulation/Gait Ambulation/Gait assistance: Min guard Gait Distance (Feet): 350 Feet Assistive device: Rolling walker (2 wheeled) Gait Pattern/deviations: Step-through pattern;Decreased stride length   Gait velocity interpretation: >2.62 ft/sec, indicative of community ambulatory General Gait Details: pt walked 8' in room without RW with initial partial LOB then significant complete LOB requiring mod assist to recover with entering bathroom. Rw used throughout remainder of gait with cues to avoid obstacles as pt catching wheels x 2 . Pt tolerated gait but noted increased unsteadiness with use of shoes today.     Stairs             Wheelchair Mobility    Modified Rankin (Stroke Patients Only)       Balance                                            Cognition Arousal/Alertness: Awake/alert Behavior During Therapy: WFL for tasks assessed/performed Overall Cognitive Status: Within Functional Limits for tasks assessed                                        Exercises General Exercises - Lower Extremity Long Arc Quad: AROM;15 reps;Seated;Both Hip Flexion/Marching: AROM;15 reps;Seated;Both    General Comments        Pertinent Vitals/Pain Pain Assessment: No/denies pain    Home Living                      Prior Function            PT Goals (current goals can now be found in the care plan section) Progress towards PT goals: Progressing toward goals    Frequency           PT Plan Current plan remains appropriate    Co-evaluation              AM-PAC PT "6 Clicks" Mobility   Outcome  Measure  Help needed turning from your back to your side while in a flat bed without using bedrails?: None Help needed moving from lying on your back to sitting on the side of a flat bed without using bedrails?: A Little Help needed moving to and from a bed to a chair (including a wheelchair)?: A Little Help needed standing up from a chair using your arms (e.g., wheelchair or bedside chair)?: A Little Help needed to walk in hospital room?: A Little Help needed climbing 3-5 steps with a railing? : A Little 6 Click Score: 19    End of Session Equipment Utilized During Treatment: Gait belt;Oxygen Activity Tolerance: Patient tolerated treatment well Patient left: in chair;with call bell/phone within reach Nurse Communication: Mobility status PT Visit Diagnosis: Other abnormalities of gait and mobility (R26.89)     Time: 8159-4707 PT Time Calculation (min) (ACUTE ONLY): 27 min  Charges:  $Gait Training: 8-22 mins $Therapeutic  Exercise: 8-22 mins                     Mason Arnold, PT Acute Rehabilitation Services Pager: [phone] Office: [place] 04/28/2019, 12:52 PM

## 2019-04-29 LAB — BASIC METABOLIC PANEL
Anion gap: 14 (ref 5–15)
BUN: 62 mg/dL — ABNORMAL HIGH (ref 8–23)
CO2: 35 mmol/L — ABNORMAL HIGH (ref 22–32)
Calcium: 9.3 mg/dL (ref 8.9–10.3)
Chloride: 84 mmol/L — ABNORMAL LOW (ref 98–111)
Creatinine, Ser: 1.59 mg/dL — ABNORMAL HIGH (ref 0.61–1.24)
GFR calc Af Amer: 47 mL/min — ABNORMAL LOW (ref >60)
GFR calc non Af Amer: 41 mL/min — ABNORMAL LOW (ref >60)
Glucose, Bld: 158 mg/dL — ABNORMAL HIGH (ref 70–99)
Potassium: 3.2 mmol/L — ABNORMAL LOW (ref 3.5–5.1)
Sodium: 133 mmol/L — ABNORMAL LOW (ref 135–145)

## 2019-04-29 LAB — COOXEMETRY PANEL
Carboxyhemoglobin: 2.2 % — ABNORMAL HIGH (ref 0.5–1.5)
Methemoglobin: 0.8 % (ref 0.0–1.5)
O2 Saturation: 67.8 %
Total hemoglobin: 14 g/dL (ref 12.0–16.0)

## 2019-04-29 LAB — GLUCOSE, CAPILLARY
Glucose-Capillary: 116 mg/dL — ABNORMAL HIGH (ref 70–99)
Glucose-Capillary: 188 mg/dL — ABNORMAL HIGH (ref 70–99)
Glucose-Capillary: 192 mg/dL — ABNORMAL HIGH (ref 70–99)
Glucose-Capillary: 115 mg/dL — ABNORMAL HIGH (ref 70–99)

## 2019-04-29 MED ORDER — POTASSIUM CHLORIDE CRYS ER 20 MEQ PO TBCR
40.0000 meq | EXTENDED_RELEASE_TABLET | Freq: Two times a day (BID) | ORAL | Status: DC
  Administered 2019-04-30 – 2019-05-06 (×13): 40 meq via ORAL
  Filled 2019-04-29 (×13): qty 2

## 2019-04-29 MED ORDER — POTASSIUM CHLORIDE CRYS ER 20 MEQ PO TBCR
40.0000 meq | EXTENDED_RELEASE_TABLET | ORAL | Status: AC
  Administered 2019-04-29 – 2019-04-30 (×6): 40 meq via ORAL
  Filled 2019-04-29 (×6): qty 2

## 2019-04-29 MED ORDER — METOLAZONE 5 MG PO TABS
5.0000 mg | ORAL_TABLET | Freq: Two times a day (BID) | ORAL | Status: AC
  Administered 2019-04-29 (×2): 5 mg via ORAL
  Filled 2019-04-29 (×2): qty 1

## 2019-04-29 NOTE — Care Management Important Message (Signed)
Important Message  Patient Details  Name: Mason Arnold MRN: [id number] Date of Birth: 1941-07-24   Medicare Important Message Given:  Yes    Orbie Pyo 04/29/2019, 12:38 PM

## 2019-04-29 NOTE — Progress Notes (Signed)
Patient ID: Mason Arnold, male   DOB: 1941/12/05, 78 y.o.   MRN: [id number]     Advanced Heart Failure Rounding Note  PCP-Cardiologist: No primary care provider on file.   Subjective:    He diuresed well again yesterday with weight down 5 lbs. CVP better at 14, co-ox 68%.  Creatinine 2.3 => 2.5 => 1.92 => 1.65 => 1.56 => 1.59.  Breathing getting better.    Objective:   Weight Range: 70.2 kg Body mass index is 25.75 kg/m.   Vital Signs:   Temp:  [97.3 F (36.3 C)-98.2 F (36.8 C)] 97.4 F (36.3 C) (05/22 0807) Pulse Rate:  [68-72] 68 (05/22 0807) Resp:  [15-23] 17 (05/22 0807) BP: (97-107)/(66-73) 101/68 (05/22 0807) SpO2:  [95 %-98 %] 98 % (05/22 0807) Weight:  [70.2 kg] 70.2 kg (05/22 0635) Last BM Date: 04/28/19  Weight change: Filed Weights   04/27/19 0600 04/28/19 0653 04/29/19 0635  Weight: 73.4 kg 72.2 kg 70.2 kg    Intake/Output:   Intake/Output Summary (Last 24 hours) at 04/29/2019 0816 Last data filed at 04/29/2019 0636 Gross per 24 hour  Intake 449.28 ml  Output 3200 ml  Net -2750.72 ml      Physical Exam    General: NAD Neck: JVP 12-14, no thyromegaly or thyroid nodule.  Lungs: Clear to auscultation bilaterally with normal respiratory effort. CV: Nondisplaced PMI.  Heart regular S1/S2, no S3/S4, 2/6 HSM apex/LLSB.  No peripheral edema.   Abdomen: Soft, nontender, no hepatosplenomegaly, no distention.  Skin: Intact without lesions or rashes.  Neurologic: Alert and oriented x 3.  Psych: Normal affect. Extremities: No clubbing or cyanosis.  HEENT: Normal.    Telemetry   NSR with BiV pacing (personally reviewed)  Labs    CBC No results for input(s): WBC, NEUTROABS, HGB, HCT, MCV, PLT in the last 72 hours. Basic Metabolic Panel Recent Labs    04/28/19 0331 04/29/19 0418  NA 132* 133*  K 3.2* 3.2*  CL 87* 84*  CO2 32 35*  GLUCOSE 124* 158*  BUN 66* 62*  CREATININE 1.56* 1.59*  CALCIUM 9.3 9.3   Liver Function Tests No results for  input(s): AST, ALT, ALKPHOS, BILITOT, PROT, ALBUMIN in the last 72 hours. No results for input(s): LIPASE, AMYLASE in the last 72 hours. Cardiac Enzymes No results for input(s): CKTOTAL, CKMB, CKMBINDEX, TROPONINI in the last 72 hours.  BNP: BNP (last 3 results) Recent Labs    02/02/19 1500 04/22/19 1330 04/24/19 1449  BNP 3,871.3* >4,500.0* 3,264.2*    ProBNP (last 3 results) No results for input(s): PROBNP in the last 8760 hours.   D-Dimer No results for input(s): DDIMER in the last 72 hours. Hemoglobin A1C No results for input(s): HGBA1C in the last 72 hours. Fasting Lipid Panel No results for input(s): CHOL, HDL, LDLCALC, TRIG, CHOLHDL, LDLDIRECT in the last 72 hours. Thyroid Function Tests No results for input(s): TSH, T4TOTAL, T3FREE, THYROIDAB in the last 72 hours.  Invalid input(s): FREET3  Other results:   Imaging    No results found.   Medications:     Scheduled Medications: . allopurinol  100 mg Oral QPM  . apixaban  5 mg Oral BID  . digoxin  125 mcg Oral Daily  . famotidine  10 mg Oral QAC supper  . FLUoxetine  20 mg Oral Daily  . insulin aspart  0-9 Units Subcutaneous TID WC  . insulin glargine  5 Units Subcutaneous QHS  . metolazone  5 mg Oral BID  .  potassium chloride  40 mEq Oral Q4H  . simvastatin  20 mg Oral q1800  . sodium chloride flush  10-40 mL Intracatheter Q12H  . spironolactone  25 mg Oral QPM    Infusions: . furosemide (LASIX) infusion 15 mg/hr (04/29/19 0111)    PRN Medications: albuterol, loratadine, nitroGLYCERIN, ondansetron (ZOFRAN) IV, polyvinyl alcohol, sodium chloride flush   Assessment/Plan   1. Acute on chronic systolic CHF: Echo (1/61) with EF 20-25%, severely decreased RV systolic function, severe MR. St Jude CRT-D.  Ischemic cardiomyopathy with significant co-existing RV failure.  This is now complicated as well by cardiorenal syndrome.  Creatinine initially up to 2.5, was in the 1.7 range in the past. Today  creatinine 1.59.  Co-ox adequate at 68%, CVP still high at 14 but coming down.  On exam, he remains volume overloaded. SBP 100s.   - Hold off on milrinone for now with good co-ox and creatinine stable.  - Continue Lasix gtt 15 mg/hr.   Will also give him metolazone 5 mg bid again today.   - Can continue spironolactone for now but watch renal function and BP closely.  - Continue digoxin 0.125, level 0.9.   - Concerning situation.  HF worsening but think poor LVAD candidate with age, renal dysfunction, RV dysfunction.  Mitraclip may be a consideration.  2. Mitral regurgitation: Severe on last echo.  Prominent murmur on exam.  Would consider Mitraclip.  3. CAD: s/p CABG. Cath in 2/20 with patent grafts.   - No ASA with Eliquis use.  - Continue simvastatin.   4. Atrial fibrillation: Paroxysmal, he is in NSR.   - Continue Eliquis.  5. AKI on CKD stage 3-4: Creatinine up to 2.5 initially, now down to 1.59.  As above, likely cardiorenal. Needs diuresis, hopefully creatinine will continue to improve with fall in renal venous pressure.  6. Type 2 DM: Jardiance on hold with low GFR.   - Continue SSI.  7. H/o recurrent syncope: none recently.   Length of Stay: [place], [place]  04/29/2019, 8:16 AM  Advanced Heart Failure Team Pager [phone] (M-F; 7a - 4p)  Please contact [facility] for night-coverage after hours (4p -7a ) and weekends on [email or web address]

## 2019-04-30 LAB — BASIC METABOLIC PANEL
Anion gap: 13 (ref 5–15)
BUN: 58 mg/dL — ABNORMAL HIGH (ref 8–23)
CO2: 34 mmol/L — ABNORMAL HIGH (ref 22–32)
Calcium: 9.2 mg/dL (ref 8.9–10.3)
Chloride: 84 mmol/L — ABNORMAL LOW (ref 98–111)
Creatinine, Ser: 1.45 mg/dL — ABNORMAL HIGH (ref 0.61–1.24)
GFR calc Af Amer: 53 mL/min — ABNORMAL LOW (ref >60)
GFR calc non Af Amer: 45 mL/min — ABNORMAL LOW (ref >60)
Glucose, Bld: 166 mg/dL — ABNORMAL HIGH (ref 70–99)
Potassium: 3.5 mmol/L (ref 3.5–5.1)
Sodium: 131 mmol/L — ABNORMAL LOW (ref 135–145)

## 2019-04-30 LAB — GLUCOSE, CAPILLARY
Glucose-Capillary: 118 mg/dL — ABNORMAL HIGH (ref 70–99)
Glucose-Capillary: 249 mg/dL — ABNORMAL HIGH (ref 70–99)
Glucose-Capillary: 145 mg/dL — ABNORMAL HIGH (ref 70–99)
Glucose-Capillary: 217 mg/dL — ABNORMAL HIGH (ref 70–99)

## 2019-04-30 LAB — COOXEMETRY PANEL
Carboxyhemoglobin: 1.9 % — ABNORMAL HIGH (ref 0.5–1.5)
Methemoglobin: 1.4 % (ref 0.0–1.5)
O2 Saturation: 71.7 %
Total hemoglobin: 13.4 g/dL (ref 12.0–16.0)

## 2019-04-30 MED ORDER — METOLAZONE 5 MG PO TABS
5.0000 mg | ORAL_TABLET | Freq: Two times a day (BID) | ORAL | Status: AC
  Administered 2019-04-30 (×2): 5 mg via ORAL
  Filled 2019-04-30 (×2): qty 1

## 2019-04-30 MED ORDER — ACETAMINOPHEN 325 MG PO TABS
650.0000 mg | ORAL_TABLET | ORAL | Status: DC | PRN
  Administered 2019-04-30 – 2019-05-04 (×3): 650 mg via ORAL
  Filled 2019-04-30 (×3): qty 2

## 2019-04-30 NOTE — Progress Notes (Signed)
Increased WOB while lying in bed. Increased Exp wheezing noted. Tachypneic at times. Albuterol PRN Neb given.

## 2019-04-30 NOTE — Discharge Instructions (Signed)

## 2019-04-30 NOTE — Progress Notes (Signed)
Physical Therapy Treatment Patient Details Name: Mason Arnold MRN: [id number] DOB: 03-Dec-1941 Today's Date: 04/30/2019    History of Present Illness 78 y.o. male admitted with edema, volume overload and CHf exacerbation. PMHx: CAD s/p CABG in 2000, ischemic cardiomyopathy with EF of 20% with St. Jude CRT-D, paroxysmal atrial fibrillation on Eliquis, gout, hypertension, hyperlipidemia, and DM    PT Comments    Pt in bed after apparent episode of incontinent bM which is not baseline for pt. Pt with improved ability with gait able to walk without RW this session with guarding and one LOB. Pt with fatigue end of gait limiting function but able to increase reps with HEP.  Pt maintained 2L throughout session with SpO2 97-100%, HR 65-73    Follow Up Recommendations  Home health PT     Equipment Recommendations  None recommended by PT    Recommendations for Other Services OT consult     Precautions / Restrictions Precautions Precautions: Fall    Mobility  Bed Mobility Overal bed mobility: Modified Independent             General bed mobility comments: HOB 20 degrees with use of rail   Transfers Overall transfer level: Needs assistance   Transfers: Sit to/from Stand Sit to Stand: Min guard         General transfer comment: guarding for lines and safety pt with instability with returning to bed  Ambulation/Gait Ambulation/Gait assistance: Min guard Gait Distance (Feet): 400 Feet Assistive device: None Gait Pattern/deviations: Step-through pattern;Decreased stride length   Gait velocity interpretation: 1.31 - 2.62 ft/sec, indicative of limited community ambulator General Gait Details: pt walked without RW this session with noted sway and partial scissoring at times with pt able to recover without assist. 1 significant LOB with min assist to recover at end of gait with fatigue. PT educated for continued need for RW with gait at this time   Stairs              Wheelchair Mobility    Modified Rankin (Stroke Patients Only)       Balance Overall balance assessment: Needs assistance   Sitting balance-Leahy Scale: Fair     Standing balance support: No upper extremity supported Standing balance-Leahy Scale: Good                              Cognition Arousal/Alertness: Awake/alert Behavior During Therapy: WFL for tasks assessed/performed Overall Cognitive Status: Within Functional Limits for tasks assessed                                        Exercises General Exercises - Lower Extremity Long Arc Quad: AROM;Seated;Both;20 reps Hip Flexion/Marching: AROM;Seated;Both;20 reps    General Comments        Pertinent Vitals/Pain Pain Assessment: No/denies pain    Home Living                      Prior Function            PT Goals (current goals can now be found in the care plan section) Progress towards PT goals: Progressing toward goals    Frequency           PT Plan Discharge plan needs to be updated    Co-evaluation  AM-PAC PT "6 Clicks" Mobility   Outcome Measure  Help needed turning from your back to your side while in a flat bed without using bedrails?: A Little Help needed moving from lying on your back to sitting on the side of a flat bed without using bedrails?: A Little Help needed moving to and from a bed to a chair (including a wheelchair)?: A Little Help needed standing up from a chair using your arms (e.g., wheelchair or bedside chair)?: A Little Help needed to walk in hospital room?: A Little Help needed climbing 3-5 steps with a railing? : A Little 6 Click Score: 18    End of Session Equipment Utilized During Treatment: Gait belt;Oxygen Activity Tolerance: Patient tolerated treatment well Patient left: in bed;with bed alarm set;with call bell/phone within reach Nurse Communication: Mobility status PT Visit Diagnosis: Other abnormalities  of gait and mobility (R26.89)     Time: 9396-8864 PT Time Calculation (min) (ACUTE ONLY): 23 min  Charges:  $Gait Training: 8-22 mins $Therapeutic Exercise: 8-22 mins                     Jasiah Buntin Pam Drown, PT Acute Rehabilitation Services Pager: [phone] Office: [place] 04/30/2019, 2:06 PM

## 2019-04-30 NOTE — Progress Notes (Signed)
States breathing is better after [employer] given. Decreased wheezing noted. Placed back on O2@3L  via [place].

## 2019-04-30 NOTE — Progress Notes (Signed)
Patient ID: Mason Arnold, male   DOB: 08/18/1941, 78 y.o.   MRN: [id number]     Advanced Heart Failure Rounding Note  PCP-Cardiologist: No primary care provider on file.   Subjective:    He appeared to have a good diuresis again yesterday by I/Os but CVP remains 18-19 today.  Co-ox 72%.  Creatinine 2.3 => 2.5 => 1.92 => 1.65 => 1.56 => 1.59 => 1.45.  Breathing is starting to get better.    Objective:   Weight Range: 69.6 kg Body mass index is 25.53 kg/m.   Vital Signs:   Temp:  [97.1 F (36.2 C)-98.1 F (36.7 C)] 97.1 F (36.2 C) (05/23 0425) Pulse Rate:  [64-77] 77 (05/23 0842) Resp:  [12-19] 18 (05/23 0842) BP: (99-111)/(61-72) 99/61 (05/23 0026) SpO2:  [96 %-100 %] 100 % (05/23 0842) Weight:  [69.6 kg] 69.6 kg (05/23 0641) Last BM Date: 04/28/19  Weight change: Filed Weights   04/28/19 0653 04/29/19 0635 04/30/19 0641  Weight: 72.2 kg 70.2 kg 69.6 kg    Intake/Output:   Intake/Output Summary (Last 24 hours) at 04/30/2019 0932 Last data filed at 04/30/2019 0640 Gross per 24 hour  Intake 1080 ml  Output 3100 ml  Net -2020 ml      Physical Exam    General: NAD Neck: JVP 14-16 cm, no thyromegaly or thyroid nodule.  Lungs: Clear to auscultation bilaterally with normal respiratory effort. CV: Lateral PMI.  Heart regular S1/S2, no S3/S4, 2/6 HSM apex.  No peripheral edema.   Abdomen: Soft, nontender, no hepatosplenomegaly, mild distention.  Skin: Intact without lesions or rashes.  Neurologic: Alert and oriented x 3.  Psych: Normal affect. Extremities: No clubbing or cyanosis.  HEENT: Normal.    Telemetry   NSR with BiV pacing (personally reviewed)  Labs    CBC No results for input(s): WBC, NEUTROABS, HGB, HCT, MCV, PLT in the last 72 hours. Basic Metabolic Panel Recent Labs    04/29/19 0418 04/30/19 0428  NA 133* 131*  K 3.2* 3.5  CL 84* 84*  CO2 35* 34*  GLUCOSE 158* 166*  BUN 62* 58*  CREATININE 1.59* 1.45*  CALCIUM 9.3 9.2   Liver  Function Tests No results for input(s): AST, ALT, ALKPHOS, BILITOT, PROT, ALBUMIN in the last 72 hours. No results for input(s): LIPASE, AMYLASE in the last 72 hours. Cardiac Enzymes No results for input(s): CKTOTAL, CKMB, CKMBINDEX, TROPONINI in the last 72 hours.  BNP: BNP (last 3 results) Recent Labs    02/02/19 1500 04/22/19 1330 04/24/19 1449  BNP 3,871.3* >4,500.0* 3,264.2*    ProBNP (last 3 results) No results for input(s): PROBNP in the last 8760 hours.   D-Dimer No results for input(s): DDIMER in the last 72 hours. Hemoglobin A1C No results for input(s): HGBA1C in the last 72 hours. Fasting Lipid Panel No results for input(s): CHOL, HDL, LDLCALC, TRIG, CHOLHDL, LDLDIRECT in the last 72 hours. Thyroid Function Tests No results for input(s): TSH, T4TOTAL, T3FREE, THYROIDAB in the last 72 hours.  Invalid input(s): FREET3  Other results:   Imaging    No results found.   Medications:     Scheduled Medications: . allopurinol  100 mg Oral QPM  . apixaban  5 mg Oral BID  . digoxin  125 mcg Oral Daily  . famotidine  10 mg Oral QAC supper  . FLUoxetine  20 mg Oral Daily  . insulin aspart  0-9 Units Subcutaneous TID WC  . insulin glargine  5 Units Subcutaneous QHS  .  metolazone  5 mg Oral BID  . potassium chloride  40 mEq Oral BID  . simvastatin  20 mg Oral q1800  . sodium chloride flush  10-40 mL Intracatheter Q12H  . spironolactone  25 mg Oral QPM    Infusions: . furosemide (LASIX) infusion 15 mg/hr (04/29/19 1915)    PRN Medications: albuterol, loratadine, nitroGLYCERIN, ondansetron (ZOFRAN) IV, polyvinyl alcohol, sodium chloride flush   Assessment/Plan   1. Acute on chronic systolic CHF: Echo (7/70) with EF 20-25%, severely decreased RV systolic function, severe MR. St Jude CRT-D.  Ischemic cardiomyopathy with significant co-existing RV failure.  This is now complicated as well by cardiorenal syndrome.  Creatinine initially up to 2.5, was in the  1.7 range in the past. Today creatinine 1.45.  Co-ox adequate at 72%, CVP still high at 18-19 despite several days of good diuresis.  On exam, he remains volume overloaded. SBP 90s.   - Hold off on milrinone for now with good co-ox and creatinine stable.  - I will continue to push diuresis today, increase Lasix gtt to 20 mg/hr and give metolazone 5 mg bid.  Replace K.    - Can continue spironolactone for now but watch renal function and BP closely.  - Continue digoxin 0.125, level 0.9.   - Concerning situation.  HF worsening but think poor LVAD candidate with age, renal dysfunction, RV dysfunction.  Mitraclip may be a consideration.  2. Mitral regurgitation: Severe on last echo.  Prominent murmur on exam.  Would consider Mitraclip.  3. CAD: s/p CABG. Cath in 2/20 with patent grafts.   - No ASA with Eliquis use.  - Continue simvastatin.   4. Atrial fibrillation: Paroxysmal, he is in NSR.   - Continue Eliquis.  5. AKI on CKD stage 3-4: Creatinine up to 2.5 initially, now down to 1.45.  As above, likely cardiorenal. Needs diuresis, hopefully creatinine will continue to improve with fall in renal venous pressure.  6. Type 2 DM: Jardiance on hold with low GFR.   - Continue SSI.  7. H/o recurrent syncope: none recently.   Length of Stay: 6  Loralie Champagne, MD  04/30/2019, 9:32 AM  Advanced Heart Failure Team Pager [phone] (M-F; 7a - 4p)  Please contact [facility] for night-coverage after hours (4p -7a ) and weekends on [email or web address]

## 2019-05-01 LAB — BASIC METABOLIC PANEL
Anion gap: 13 (ref 5–15)
Anion gap: 13 (ref 5–15)
BUN: 62 mg/dL — ABNORMAL HIGH (ref 8–23)
BUN: 60 mg/dL — ABNORMAL HIGH (ref 8–23)
CO2: 37 mmol/L — ABNORMAL HIGH (ref 22–32)
CO2: 36 mmol/L — ABNORMAL HIGH (ref 22–32)
Calcium: 9.1 mg/dL (ref 8.9–10.3)
Calcium: 9.3 mg/dL (ref 8.9–10.3)
Chloride: 81 mmol/L — ABNORMAL LOW (ref 98–111)
Chloride: 80 mmol/L — ABNORMAL LOW (ref 98–111)
Creatinine, Ser: 1.57 mg/dL — ABNORMAL HIGH (ref 0.61–1.24)
Creatinine, Ser: 1.63 mg/dL — ABNORMAL HIGH (ref 0.61–1.24)
GFR calc Af Amer: 48 mL/min — ABNORMAL LOW (ref >60)
GFR calc Af Amer: 46 mL/min — ABNORMAL LOW (ref >60)
GFR calc non Af Amer: 41 mL/min — ABNORMAL LOW (ref >60)
GFR calc non Af Amer: 39 mL/min — ABNORMAL LOW (ref >60)
Glucose, Bld: 191 mg/dL — ABNORMAL HIGH (ref 70–99)
Glucose, Bld: 268 mg/dL — ABNORMAL HIGH (ref 70–99)
Potassium: 2.6 mmol/L — CL (ref 3.5–5.1)
Potassium: 3.4 mmol/L — ABNORMAL LOW (ref 3.5–5.1)
Sodium: 131 mmol/L — ABNORMAL LOW (ref 135–145)
Sodium: 129 mmol/L — ABNORMAL LOW (ref 135–145)

## 2019-05-01 LAB — GLUCOSE, CAPILLARY
Glucose-Capillary: 111 mg/dL — ABNORMAL HIGH (ref 70–99)
Glucose-Capillary: 128 mg/dL — ABNORMAL HIGH (ref 70–99)
Glucose-Capillary: 171 mg/dL — ABNORMAL HIGH (ref 70–99)
Glucose-Capillary: 213 mg/dL — ABNORMAL HIGH (ref 70–99)
Glucose-Capillary: 89 mg/dL (ref 70–99)

## 2019-05-01 MED ORDER — METOLAZONE 5 MG PO TABS
5.0000 mg | ORAL_TABLET | Freq: Two times a day (BID) | ORAL | Status: DC
  Administered 2019-05-01 – 2019-05-02 (×3): 5 mg via ORAL
  Filled 2019-05-01 (×3): qty 1

## 2019-05-01 MED ORDER — POTASSIUM CHLORIDE CRYS ER 20 MEQ PO TBCR
40.0000 meq | EXTENDED_RELEASE_TABLET | Freq: Once | ORAL | Status: AC
  Administered 2019-05-01: 40 meq via ORAL
  Filled 2019-05-01: qty 2

## 2019-05-01 MED ORDER — MILRINONE LACTATE IN DEXTROSE 20-5 MG/100ML-% IV SOLN
0.2500 ug/kg/min | INTRAVENOUS | Status: DC
  Administered 2019-05-01 – 2019-05-06 (×6): 0.25 ug/kg/min via INTRAVENOUS
  Filled 2019-05-01 (×7): qty 100

## 2019-05-01 MED ORDER — POTASSIUM CHLORIDE CRYS ER 20 MEQ PO TBCR: 80.0000 meq | EXTENDED_RELEASE_TABLET | Freq: Once | ORAL | Status: DC

## 2019-05-01 MED ORDER — POTASSIUM CHLORIDE CRYS ER 20 MEQ PO TBCR
40.0000 meq | EXTENDED_RELEASE_TABLET | ORAL | Status: AC
  Administered 2019-05-01 (×2): 40 meq via ORAL
  Filled 2019-05-01 (×2): qty 2

## 2019-05-01 NOTE — Progress Notes (Signed)
CRITICAL VALUE ALERT  Critical Value: K 2.6  Date & Time Notied: 05/01/2019 @ 3958  Provider Notified: Dr. Argentina Donovan  Orders Received/Actions taken: Give 80 mEq Kcl x 1

## 2019-05-01 NOTE — Evaluation (Signed)
Occupational Therapy Evaluation Patient Details Name: Mason Arnold MRN: [id number] DOB: 06/26/1941 Today's Date: 05/01/2019    History of Present Illness 78 y.o. male admitted with edema, volume overload and CHf exacerbation. PMHx: CAD s/p CABG in 2000, ischemic cardiomyopathy with EF of 20% with St. Jude CRT-D, paroxysmal atrial fibrillation on Eliquis, gout, hypertension, hyperlipidemia, and DM   Clinical Impression   Pt PTA: living with spouse and independent with ADL, slowing down due to weakness and fatigue. Pt currently limited by SOB, frequent rest breaks and increased time for tasks. Pt performing ADL tasks with increased time; O2 on 2L >90% and HR remains <110 BPm with exertion. Pt performing ADLs at supervision level, but requires rest breaks and fatigues quickly. Pt using RW for mobility minguardA overall. Pt would benefit from continued OT skilled services for ADL, mobility and energy conservation. OT to follow acutely.     Follow Up Recommendations  Home health OT;Supervision - Intermittent    Equipment Recommendations  None recommended by OT    Recommendations for Other Services       Precautions / Restrictions Precautions Precautions: Fall Restrictions Weight Bearing Restrictions: No      Mobility Bed Mobility Overal bed mobility: Modified Independent             General bed mobility comments: HOB 20 degrees with use of rail   Transfers Overall transfer level: Needs assistance Equipment used: Rolling walker (2 wheeled) Transfers: Sit to/from Stand Sit to Stand: Min guard         General transfer comment: guarding for lines and safety; cues for proper hand placement    Balance Overall balance assessment: Needs assistance Sitting-balance support: Single extremity supported;Feet supported Sitting balance-Leahy Scale: Fair     Standing balance support: No upper extremity supported Standing balance-Leahy Scale: Good                              ADL either performed or assessed with clinical judgement   ADL Overall ADL's : At baseline                                       General ADL Comments: Pt requires increased time and frequent rest breaks- otherwise, pt was supervision level for toilet hygiene in standing and standing at sink for grooming tasks x6 mins for excessive teeth brushing.     Vision Baseline Vision/History: Wears glasses Wears Glasses: At all times Patient Visual Report: No change from baseline Vision Assessment?: No apparent visual deficits     Perception     Praxis      Pertinent Vitals/Pain Pain Assessment: No/denies pain     Hand Dominance     Extremity/Trunk Assessment Upper Extremity Assessment Upper Extremity Assessment: Generalized weakness   Lower Extremity Assessment Lower Extremity Assessment: Generalized weakness   Cervical / Trunk Assessment Cervical / Trunk Assessment: Normal   Communication Communication Communication: No difficulties   Cognition Arousal/Alertness: Awake/alert Behavior During Therapy: WFL for tasks assessed/performed Overall Cognitive Status: Within Functional Limits for tasks assessed                                     General Comments  Pt performing ADL tasks with increased time; O2 on 2L >90% and HR remaines <110 BPm  with exertion.    Exercises     Shoulder Instructions      Home Living Family/patient expects to be discharged to:: Private residence Living Arrangements: Spouse/significant other Available Help at Discharge: Family;Available 24 hours/day Type of Home: House Home Access: Stairs to enter [employer] of Steps: 5 Entrance Stairs-Rails: Can reach both Home Layout: One level     Bathroom Shower/Tub: Teacher, early years/pre: Standard     Home Equipment: Environmental consultant - 2 wheels;Cane - single point;Grab bars - tub/shower          Prior Functioning/Environment Level of  Independence: Independent        Comments: pt just started using RW a few days ago due to instability and falls at home        OT Problem List: Decreased strength;Decreased activity tolerance;Decreased safety awareness      OT Treatment/Interventions: Self-care/ADL training;Therapeutic exercise;Energy conservation;Neuromuscular education;Therapeutic activities;Patient/family education;Balance training    OT Goals(Current goals can be found in the care plan section) Acute Rehab OT Goals Patient Stated Goal: return home and be more active OT Goal Formulation: With patient Time For Goal Achievement: 05/15/19 Potential to Achieve Goals: Good ADL Goals Pt Will Perform Lower Body Dressing: with modified independence;sit to/from stand Additional ADL Goal #1: Pt will state 3 energy conservation techniques in order to carry over at home. Additional ADL Goal #2: Pt will stand x15 mins for ADL tasks with fair balance and modified independence  OT Frequency: Min 2X/week   Barriers to D/C:            Co-evaluation              AM-PAC OT "6 Clicks" Daily Activity     Outcome Measure Help from another person eating meals?: None Help from another person taking care of personal grooming?: A Little Help from another person toileting, which includes using toliet, bedpan, or urinal?: A Little Help from another person bathing (including washing, rinsing, drying)?: A Little Help from another person to put on and taking off regular upper body clothing?: None Help from another person to put on and taking off regular lower body clothing?: A Little 6 Click Score: 20   End of Session Equipment Utilized During Treatment: Gait belt;Rolling walker Nurse Communication: Mobility status  Activity Tolerance: Patient tolerated treatment well Patient left: in chair;with call bell/phone within reach(RN comfortable with no chair alarm daytime)  OT Visit Diagnosis: Unsteadiness on feet (R26.81);Muscle  weakness (generalized) (M62.81)                Time: 1327-1400 OT Time Calculation (min): 33 min Charges:  OT General Charges $OT Visit: 1 Visit OT Evaluation $OT Eval Moderate Complexity: 1 Mod OT Treatments $Self Care/Home Management : 8-22 mins  Ebony Hail Harold Hedge) Marsa Aris OTR/L Acute Rehabilitation Services Pager: [phone] Office: [place] 05/01/2019, 4:10 PM

## 2019-05-01 NOTE — Progress Notes (Signed)
Patient ID: Mason Arnold, male   DOB: October 02, 1941, 78 y.o.   MRN: [id number]     Advanced Heart Failure Rounding Note  PCP-Cardiologist: No primary care provider on file.   Subjective:    He did not diurese as well yesterday, CVP now > 20 again.  Creatinine a little higher.  Creatinine 2.3 => 2.5 => 1.92 => 1.65 => 1.56 => 1.59 => 1.45 => 1.59.  Still short of breath with exertion.     Objective:   Weight Range: 69.8 kg Body mass index is 25.61 kg/m.   Vital Signs:   Temp:  [97.7 F (36.5 C)-98.7 F (37.1 C)] 97.9 F (36.6 C) (05/24 0815) Pulse Rate:  [64-72] 72 (05/24 0815) Resp:  [14-24] 19 (05/24 0815) BP: (92-102)/(69-78) 92/71 (05/24 0815) SpO2:  [88 %-98 %] 88 % (05/24 0815) Weight:  [69.8 kg] 69.8 kg (05/24 0623) Last BM Date: 04/30/19  Weight change: Filed Weights   04/29/19 0635 04/30/19 0641 05/01/19 0623  Weight: 70.2 kg 69.6 kg 69.8 kg    Intake/Output:   Intake/Output Summary (Last 24 hours) at 05/01/2019 0939 Last data filed at 05/01/2019 4268 Gross per 24 hour  Intake 1320.49 ml  Output 2925 ml  Net -1604.51 ml      Physical Exam    General: NAD Neck: JVP 16 cm, no thyromegaly or thyroid nodule.  Lungs: Decreased BS at bases.  CV: Lateral PMI.  Heart regular S1/S2, no S3/S4, 3/6 HSM apex.  No peripheral edema.   Abdomen: Soft, nontender, no hepatosplenomegaly, no distention.  Skin: Intact without lesions or rashes.  Neurologic: Alert and oriented x 3.  Psych: Normal affect. Extremities: No clubbing or cyanosis.  HEENT: Normal.    Telemetry   NSR with BiV pacing in 60s (personally reviewed)  Labs    CBC No results for input(s): WBC, NEUTROABS, HGB, HCT, MCV, PLT in the last 72 hours. Basic Metabolic Panel Recent Labs    04/30/19 0428 05/01/19 0420  NA 131* 131*  K 3.5 2.6*  CL 84* 81*  CO2 34* 37*  GLUCOSE 166* 191*  BUN 58* 62*  CREATININE 1.45* 1.57*  CALCIUM 9.2 9.1   Liver Function Tests No results for input(s): AST,  ALT, ALKPHOS, BILITOT, PROT, ALBUMIN in the last 72 hours. No results for input(s): LIPASE, AMYLASE in the last 72 hours. Cardiac Enzymes No results for input(s): CKTOTAL, CKMB, CKMBINDEX, TROPONINI in the last 72 hours.  BNP: BNP (last 3 results) Recent Labs    02/02/19 1500 04/22/19 1330 04/24/19 1449  BNP 3,871.3* >4,500.0* 3,264.2*    ProBNP (last 3 results) No results for input(s): PROBNP in the last 8760 hours.   D-Dimer No results for input(s): DDIMER in the last 72 hours. Hemoglobin A1C No results for input(s): HGBA1C in the last 72 hours. Fasting Lipid Panel No results for input(s): CHOL, HDL, LDLCALC, TRIG, CHOLHDL, LDLDIRECT in the last 72 hours. Thyroid Function Tests No results for input(s): TSH, T4TOTAL, T3FREE, THYROIDAB in the last 72 hours.  Invalid input(s): FREET3  Other results:   Imaging    No results found.   Medications:     Scheduled Medications: . allopurinol  100 mg Oral QPM  . apixaban  5 mg Oral BID  . digoxin  125 mcg Oral Daily  . famotidine  10 mg Oral QAC supper  . FLUoxetine  20 mg Oral Daily  . insulin aspart  0-9 Units Subcutaneous TID WC  . insulin glargine  5 Units Subcutaneous QHS  .  metolazone  5 mg Oral BID  . potassium chloride  40 mEq Oral BID  . potassium chloride  40 mEq Oral Once  . potassium chloride  40 mEq Oral Once  . simvastatin  20 mg Oral q1800  . sodium chloride flush  10-40 mL Intracatheter Q12H  . spironolactone  25 mg Oral QPM    Infusions: . furosemide (LASIX) infusion 20 mg/hr (05/01/19 0111)  . milrinone      PRN Medications: acetaminophen, albuterol, loratadine, nitroGLYCERIN, ondansetron (ZOFRAN) IV, polyvinyl alcohol, sodium chloride flush   Assessment/Plan   1. Acute on chronic systolic CHF: Echo (7/84) with EF 20-25%, severely decreased RV systolic function, severe MR. St Jude CRT-D.  Ischemic cardiomyopathy with significant co-existing RV failure.  This is now complicated as well by  cardiorenal syndrome.  Creatinine initially up to 2.5, was in the 1.7 range in the past. Today creatinine is higher at 1.59 and he did not diurese as well, weight not down.  CVP >20 today.  On exam, he remains volume overloaded. SBP 90s.   - Start milrinone 0.25 mcg/kg/min today to aide diuresis.  - Continue Lasix gtt to 20 mg/hr and give metolazone 5 mg bid.  Replace K aggressively today, repeat BMET in afternoon.    - Can continue spironolactone for now but watch renal function and BP closely.  - Continue digoxin 0.125, level 0.9.   - Concerning situation.  HF worsening but think poor LVAD candidate with age, renal dysfunction, RV dysfunction.  Mitraclip may be a consideration.  Given resistant volume overload and few other options, think we need to work in that direction.  2. Mitral regurgitation: Severe on last echo.  Prominent murmur on exam.  Will plan for TEE on Tuesday and evaluation by structure heart team.  3. CAD: s/p CABG. Cath in 2/20 with patent grafts.   - No ASA with Eliquis use.  - Continue simvastatin.   4. Atrial fibrillation: Paroxysmal, he is in NSR.   - Continue Eliquis.  5. AKI on CKD stage 3-4: Creatinine up to 2.5 initially, today 1.59 which is mildly higher than yesterday.  As above, likely cardiorenal.  As above, he needs a lot more diuresis.  Will add milrinone to help facilitate.  6. Type 2 DM: Jardiance on hold with low GFR.   - Continue SSI.  7. H/o recurrent syncope: none recently.   Length of Stay: 7  Loralie Champagne, MD  05/01/2019, 9:39 AM  Advanced Heart Failure Team Pager [phone] (M-F; 7a - 4p)  Please contact [facility] for night-coverage after hours (4p -7a ) and weekends on [email or web address]

## 2019-05-02 ENCOUNTER — Encounter (HOSPITAL_COMMUNITY): Payer: Self-pay

## 2019-05-02 LAB — BASIC METABOLIC PANEL
Anion gap: 14 (ref 5–15)
Anion gap: 15 (ref 5–15)
BUN: 58 mg/dL — ABNORMAL HIGH (ref 8–23)
BUN: 60 mg/dL — ABNORMAL HIGH (ref 8–23)
CO2: 38 mmol/L — ABNORMAL HIGH (ref 22–32)
CO2: 37 mmol/L — ABNORMAL HIGH (ref 22–32)
Calcium: 9.3 mg/dL (ref 8.9–10.3)
Calcium: 9.4 mg/dL (ref 8.9–10.3)
Chloride: 78 mmol/L — ABNORMAL LOW (ref 98–111)
Chloride: 78 mmol/L — ABNORMAL LOW (ref 98–111)
Creatinine, Ser: 1.44 mg/dL — ABNORMAL HIGH (ref 0.61–1.24)
Creatinine, Ser: 1.51 mg/dL — ABNORMAL HIGH (ref 0.61–1.24)
GFR calc Af Amer: 53 mL/min — ABNORMAL LOW (ref >60)
GFR calc Af Amer: 50 mL/min — ABNORMAL LOW (ref >60)
GFR calc non Af Amer: 46 mL/min — ABNORMAL LOW (ref >60)
GFR calc non Af Amer: 43 mL/min — ABNORMAL LOW (ref >60)
Glucose, Bld: 184 mg/dL — ABNORMAL HIGH (ref 70–99)
Glucose, Bld: 229 mg/dL — ABNORMAL HIGH (ref 70–99)
Potassium: 2.8 mmol/L — ABNORMAL LOW (ref 3.5–5.1)
Potassium: 3.5 mmol/L (ref 3.5–5.1)
Sodium: 130 mmol/L — ABNORMAL LOW (ref 135–145)
Sodium: 130 mmol/L — ABNORMAL LOW (ref 135–145)

## 2019-05-02 LAB — COOXEMETRY PANEL
Carboxyhemoglobin: 2 % — ABNORMAL HIGH (ref 0.5–1.5)
Methemoglobin: 1.7 % — ABNORMAL HIGH (ref 0.0–1.5)
O2 Saturation: 69.5 %
Total hemoglobin: 13.1 g/dL (ref 12.0–16.0)

## 2019-05-02 LAB — GLUCOSE, CAPILLARY
Glucose-Capillary: 112 mg/dL — ABNORMAL HIGH (ref 70–99)
Glucose-Capillary: 253 mg/dL — ABNORMAL HIGH (ref 70–99)
Glucose-Capillary: 195 mg/dL — ABNORMAL HIGH (ref 70–99)
Glucose-Capillary: 100 mg/dL — ABNORMAL HIGH (ref 70–99)

## 2019-05-02 MED ORDER — POTASSIUM CHLORIDE CRYS ER 20 MEQ PO TBCR
40.0000 meq | EXTENDED_RELEASE_TABLET | Freq: Once | ORAL | Status: AC
  Administered 2019-05-02: 40 meq via ORAL
  Filled 2019-05-02: qty 2

## 2019-05-02 NOTE — Progress Notes (Signed)
Physical Therapy Treatment Patient Details Name: Mason Arnold MRN: [id number] DOB: [birth year] Today's Date: 05/02/2019    History of Present Illness 78 y.o. male admitted with edema, volume overload and CHf exacerbation. PMHx: CAD s/p CABG in 2000, ischemic cardiomyopathy with EF of 20% with St. Jude CRT-D, paroxysmal atrial fibrillation on Eliquis, gout, hypertension, hyperlipidemia, and DM    PT Comments    Pt pleasant and reports feeling better today. Pt with continued need for RW with progressing ability with HEP. Pt able to perform 5 sit to stands in 25 sec this session and maintain SpO2 93-97% on RA with 1/4 DOE. Will continue to follow.    Follow Up Recommendations  Home health PT     Equipment Recommendations  None recommended by PT    Recommendations for Other Services       Precautions / Restrictions Precautions Precautions: Fall    Mobility  Bed Mobility Overal bed mobility: Modified Independent             General bed mobility comments: HOB flat with rail   Transfers Overall transfer level: Needs assistance   Transfers: Sit to/from Stand Sit to Stand: Min guard         General transfer comment: guarding for lines and safety, initial posterior lean  Ambulation/Gait Ambulation/Gait assistance: Supervision Gait Distance (Feet): 400 Feet Assistive device: Rolling walker (2 wheeled)     Gait velocity interpretation: >2.62 ft/sec, indicative of community ambulatory General Gait Details: pt walked with RW this session per his request as he is ok with accepting this new normal and fatigues. Pt unable to increase gait distance but maintained SpO2 93-97% on 2L with HR 72-81   Stairs             Wheelchair Mobility    Modified Rankin (Stroke Patients Only)       Balance   Sitting-balance support: Feet supported;No upper extremity supported Sitting balance-Leahy Scale: Good     Standing balance support: During functional  activity;Bilateral upper extremity supported Standing balance-Leahy Scale: Fair                              Cognition Arousal/Alertness: Awake/alert Behavior During Therapy: WFL for tasks assessed/performed Overall Cognitive Status: Within Functional Limits for tasks assessed                                        Exercises General Exercises - Lower Extremity Long Arc Quad: AROM;Seated;Both;Other reps (comment)(25) Hip Flexion/Marching: AROM;Seated;Both;Other reps (comment)(25 reps) Other Exercises Other Exercises: sit to stand x 5 in 25 seconds with reliance on hands on armrest    General Comments        Pertinent Vitals/Pain Pain Assessment: No/denies pain    Home Living                      Prior Function            PT Goals (current goals can now be found in the care plan section) Progress towards PT goals: Progressing toward goals    Frequency           PT Plan Current plan remains appropriate    Co-evaluation              AM-PAC PT "6 Clicks" Mobility   Outcome Measure  Help needed  turning from your back to your side while in a flat bed without using bedrails?: A Little Help needed moving from lying on your back to sitting on the side of a flat bed without using bedrails?: A Little Help needed moving to and from a bed to a chair (including a wheelchair)?: A Little Help needed standing up from a chair using your arms (e.g., wheelchair or bedside chair)?: A Little Help needed to walk in hospital room?: A Little Help needed climbing 3-5 steps with a railing? : A Little 6 Click Score: 18    End of Session Equipment Utilized During Treatment: Gait belt;Oxygen Activity Tolerance: Patient tolerated treatment well Patient left: in chair;with call bell/phone within reach Nurse Communication: Mobility status PT Visit Diagnosis: Other abnormalities of gait and mobility (R26.89)     Time: 1041-1106 PT Time  Calculation (min) (ACUTE ONLY): 25 min  Charges:  $Gait Training: 8-22 mins $Therapeutic Exercise: 8-22 mins                     Dimas Scheck Pam Drown, PT Acute Rehabilitation Services Pager: [phone] Office: [place] 05/02/2019, 1:16 PM

## 2019-05-02 NOTE — Progress Notes (Addendum)
Patient ID: Mason Arnold, male   DOB: November 19, 1941, 78 y.o.   MRN: [id number]     Advanced Heart Failure Rounding Note  PCP-Cardiologist: No primary care provider on file.   Subjective:    Started on milrinone yesterday for RV support in setting of rising creatinine and persistently high CVP. Co-ox stable at 69%.   Urine output has picked up greatly. Over 3L out. Weight down almost 3 pounds. Breathing better.  No orthopnea or PND.    Objective:   Weight Range: 68.5 kg Body mass index is 25.13 kg/m.   Vital Signs:   Temp:  [97.8 F (36.6 C)-98.5 F (36.9 C)] 98.1 F (36.7 C) (05/25 0820) Pulse Rate:  [65-82] 73 (05/25 0820) Resp:  [15-31] 31 (05/25 0820) BP: (95-112)/(61-76) 105/63 (05/25 0820) SpO2:  [94 %-99 %] 94 % (05/25 0820) Weight:  [68.5 kg] 68.5 kg (05/25 0650) Last BM Date: 05/02/19  Weight change: Filed Weights   04/30/19 0641 05/01/19 0623 05/02/19 0650  Weight: 69.6 kg 69.8 kg 68.5 kg    Intake/Output:   Intake/Output Summary (Last 24 hours) at 05/02/2019 1147 Last data filed at 05/02/2019 1109 Gross per 24 hour  Intake 903.78 ml  Output 3075 ml  Net -2171.22 ml      Physical Exam    General:  Sitting up in bed . No resp difficulty HEENT: normal Neck: supple. JVP to jaw . Carotids 2+ bilat; no bruits. No lymphadenopathy or thryomegaly appreciated. Cor: PMI nondisplaced. Regular rate & rhythm. 2/6 MR Lungs: clear Abdomen: soft, nontender, nondistended. No hepatosplenomegaly. No bruits or masses. Good bowel sounds. Extremities: no cyanosis, clubbing, rash, edema Neuro: alert & orientedx3, cranial nerves grossly intact. moves all 4 extremities w/o difficulty. Affect pleasant    Telemetry   NSR with BiV pacing (personally reviewed)  Labs    CBC No results for input(s): WBC, NEUTROABS, HGB, HCT, MCV, PLT in the last 72 hours. Basic Metabolic Panel Recent Labs    05/01/19 1545 05/02/19 0507  NA 129* 130*  K 3.4* 2.8*  CL 80* 78*  CO2  36* 38*  GLUCOSE 268* 184*  BUN 60* 58*  CREATININE 1.63* 1.44*  CALCIUM 9.3 9.3   Liver Function Tests No results for input(s): AST, ALT, ALKPHOS, BILITOT, PROT, ALBUMIN in the last 72 hours. No results for input(s): LIPASE, AMYLASE in the last 72 hours. Cardiac Enzymes No results for input(s): CKTOTAL, CKMB, CKMBINDEX, TROPONINI in the last 72 hours.  BNP: BNP (last 3 results) Recent Labs    02/02/19 1500 04/22/19 1330 04/24/19 1449  BNP 3,871.3* >4,500.0* 3,264.2*    ProBNP (last 3 results) No results for input(s): PROBNP in the last 8760 hours.   D-Dimer No results for input(s): DDIMER in the last 72 hours. Hemoglobin A1C No results for input(s): HGBA1C in the last 72 hours. Fasting Lipid Panel No results for input(s): CHOL, HDL, LDLCALC, TRIG, CHOLHDL, LDLDIRECT in the last 72 hours. Thyroid Function Tests No results for input(s): TSH, T4TOTAL, T3FREE, THYROIDAB in the last 72 hours.  Invalid input(s): FREET3  Other results:   Imaging    No results found.   Medications:     Scheduled Medications: . allopurinol  100 mg Oral QPM  . apixaban  5 mg Oral BID  . digoxin  125 mcg Oral Daily  . famotidine  10 mg Oral QAC supper  . FLUoxetine  20 mg Oral Daily  . insulin aspart  0-9 Units Subcutaneous TID WC  . insulin glargine  5 Units Subcutaneous QHS  . metolazone  5 mg Oral BID  . potassium chloride  40 mEq Oral BID  . simvastatin  20 mg Oral q1800  . sodium chloride flush  10-40 mL Intracatheter Q12H  . spironolactone  25 mg Oral QPM    Infusions: . furosemide (LASIX) infusion 20 mg/hr (05/02/19 0445)  . milrinone 0.25 mcg/kg/min (05/02/19 0027)    PRN Medications: acetaminophen, albuterol, loratadine, nitroGLYCERIN, ondansetron (ZOFRAN) IV, polyvinyl alcohol, sodium chloride flush   Assessment/Plan   1. Acute on chronic systolic CHF: Echo (5/24) with EF 20-25%, severely decreased RV systolic function, severe MR. St Jude CRT-D.  Ischemic  cardiomyopathy with significant co-existing RV failure.  This is now complicated as well by cardiorenal syndrome.   - Now on milrinone 0.25 Co-ox stable at 69% - Good response  To milrinone creatinine 1.6 -> 1.4. Diuresis improving but still overloaded. Willcontinue milrinone an IV lasix.  - Continue Lasix gtt to 20 mg/hr and metolazone 5 mg bid.  Replace K.    - Can continue spironolactone for now but watch renal function and BP closely.  - Continue digoxin 0.125, level 0.9.   - Concerning situation.  HF worsening but think poor LVAD candidate with age, renal dysfunction, RV dysfunction.  Mitraclip may be a consideration. I have consulted the structural team.  - Will need TEE this week  2. Mitral regurgitation: Severe on last echo.  Prominent murmur on exam.  Consult for MitralClip as above. Arrange TEE 3. CAD: s/p CABG. Cath in 2/20 with patent grafts.   - No ASA with Eliquis use.  - Continue simvastatin.   4. Atrial fibrillation: Paroxysmal, he is in NSR.   - Continue Eliquis.  5. AKI on CKD stage 3-4: Creatinine up to 2.5 initially, now down to 1.44.  As above, likely cardiorenal. Needs diuresis, hopefully creatinine will continue to improve with fall in renal venous pressure and milrinone support.  6. Type 2 DM: Jardiance on hold with low GFR.   - Continue SSI.  7. H/o recurrent syncope: none recently.  8. Hypokalemia - supp K aggressively. hold metolazone until wee catch up. Repeat BMET at 4pm  Length of Stay: [place], MD  05/02/2019, 11:47 AM  Advanced Heart Failure Team Pager [phone] (M-F; [place])  Please contact [facility] for night-coverage after hours (4p -7a ) and weekends on [email or web address]

## 2019-05-03 ENCOUNTER — Encounter (HOSPITAL_COMMUNITY): Payer: Self-pay | Admitting: Physician Assistant

## 2019-05-03 LAB — BASIC METABOLIC PANEL
Anion gap: 16 — ABNORMAL HIGH (ref 5–15)
Anion gap: 15 (ref 5–15)
BUN: 61 mg/dL — ABNORMAL HIGH (ref 8–23)
BUN: 60 mg/dL — ABNORMAL HIGH (ref 8–23)
CO2: 37 mmol/L — ABNORMAL HIGH (ref 22–32)
CO2: 35 mmol/L — ABNORMAL HIGH (ref 22–32)
Calcium: 9.6 mg/dL (ref 8.9–10.3)
Calcium: 9.6 mg/dL (ref 8.9–10.3)
Chloride: 77 mmol/L — ABNORMAL LOW (ref 98–111)
Chloride: 83 mmol/L — ABNORMAL LOW (ref 98–111)
Creatinine, Ser: 1.4 mg/dL — ABNORMAL HIGH (ref 0.61–1.24)
Creatinine, Ser: 1.45 mg/dL — ABNORMAL HIGH (ref 0.61–1.24)
GFR calc Af Amer: 55 mL/min — ABNORMAL LOW (ref >60)
GFR calc Af Amer: 53 mL/min — ABNORMAL LOW (ref >60)
GFR calc non Af Amer: 47 mL/min — ABNORMAL LOW (ref >60)
GFR calc non Af Amer: 45 mL/min — ABNORMAL LOW (ref >60)
Glucose, Bld: 186 mg/dL — ABNORMAL HIGH (ref 70–99)
Glucose, Bld: 131 mg/dL — ABNORMAL HIGH (ref 70–99)
Potassium: 2.7 mmol/L — CL (ref 3.5–5.1)
Potassium: 3 mmol/L — ABNORMAL LOW (ref 3.5–5.1)
Sodium: 130 mmol/L — ABNORMAL LOW (ref 135–145)
Sodium: 133 mmol/L — ABNORMAL LOW (ref 135–145)

## 2019-05-03 LAB — GLUCOSE, CAPILLARY
Glucose-Capillary: 121 mg/dL — ABNORMAL HIGH (ref 70–99)
Glucose-Capillary: 114 mg/dL — ABNORMAL HIGH (ref 70–99)
Glucose-Capillary: 125 mg/dL — ABNORMAL HIGH (ref 70–99)
Glucose-Capillary: 162 mg/dL — ABNORMAL HIGH (ref 70–99)

## 2019-05-03 LAB — COOXEMETRY PANEL
Carboxyhemoglobin: 1.9 % — ABNORMAL HIGH (ref 0.5–1.5)
Methemoglobin: 1.6 % — ABNORMAL HIGH (ref 0.0–1.5)
O2 Saturation: 66.6 %
Total hemoglobin: 12.8 g/dL (ref 12.0–16.0)

## 2019-05-03 MED ORDER — ACETAZOLAMIDE ER 500 MG PO CP12
500.0000 mg | ORAL_CAPSULE | Freq: Two times a day (BID) | ORAL | Status: DC
  Administered 2019-05-03 – 2019-05-06 (×6): 500 mg via ORAL
  Filled 2019-05-03 (×8): qty 1

## 2019-05-03 MED ORDER — POTASSIUM CHLORIDE CRYS ER 20 MEQ PO TBCR
40.0000 meq | EXTENDED_RELEASE_TABLET | Freq: Once | ORAL | Status: AC
  Administered 2019-05-03: 40 meq via ORAL
  Filled 2019-05-03: qty 2

## 2019-05-03 MED ORDER — LOPERAMIDE HCL 2 MG PO CAPS
2.0000 mg | ORAL_CAPSULE | ORAL | Status: DC | PRN
  Administered 2019-05-03: 2 mg via ORAL
  Filled 2019-05-03: qty 1

## 2019-05-03 MED ORDER — METOLAZONE 5 MG PO TABS
5.0000 mg | ORAL_TABLET | Freq: Once | ORAL | Status: AC
  Administered 2019-05-03: 5 mg via ORAL
  Filled 2019-05-03: qty 1

## 2019-05-03 NOTE — Anesthesia Preprocedure Evaluation (Signed)
Anesthesia Evaluation    Reviewed: Allergy & Precautions, H&P , Patient's Chart, lab work & pertinent test results  Airway        Dental   Pulmonary neg pulmonary ROS,           Cardiovascular Exercise Tolerance: Good hypertension, Pt. on medications + CAD, + CABG and +CHF  negative cardio ROS  + dysrhythmias Atrial Fibrillation      Neuro/Psych Depression negative neurological ROS  negative psych ROS   GI/Hepatic negative GI ROS, Neg liver ROS,   Endo/Other  negative endocrine ROSdiabetes, Insulin Dependent  Renal/GU negative Renal ROS  negative genitourinary   Musculoskeletal   Abdominal   Peds  Hematology negative hematology ROS (+)   Anesthesia Other Findings   Reproductive/Obstetrics negative OB ROS                             Anesthesia Physical Anesthesia Plan  ASA: IV  Anesthesia Plan: MAC   Post-op Pain Management:    Induction: Intravenous  PONV Risk Score and Plan: 1 and Propofol infusion  Airway Management Planned: Nasal Cannula  Additional Equipment:   Intra-op Plan:   Post-operative Plan:   Informed Consent: I have reviewed the patients History and Physical, chart, labs and discussed the procedure including the risks, benefits and alternatives for the proposed anesthesia with the patient or authorized representative who has indicated his/her understanding and acceptance.     Dental advisory given  Plan Discussed with: CRNA  Anesthesia Plan Comments:         Anesthesia Quick Evaluation

## 2019-05-03 NOTE — Progress Notes (Signed)
Occupational Therapy Treatment Patient Details Name: Mason Arnold MRN: [id number] DOB: 06-14-1941 Today's Date: 05/03/2019    History of present illness 78 y.o. male admitted with edema, volume overload and CHf exacerbation. PMHx: CAD s/p CABG in 2000, ischemic cardiomyopathy with EF of 20% with St. Jude CRT-D, paroxysmal atrial fibrillation on Eliquis, gout, hypertension, hyperlipidemia, and DM   OT comments  Pt progressing towards acute OT goals. Focus of session was toileting and grooming tasks. Pt preparing to walk with mobility tech at end of OT session. D/c plan remains appropriate.    Follow Up Recommendations  Home health OT;Supervision - Intermittent    Equipment Recommendations  None recommended by OT    Recommendations for Other Services      Precautions / Restrictions Precautions Precautions: Fall Restrictions Weight Bearing Restrictions: No       Mobility Bed Mobility Overal bed mobility: Modified Independent                Transfers Overall transfer level: Needs assistance Equipment used: Rolling walker (2 wheeled) Transfers: Sit to/from Stand Sit to Stand: Min guard         General transfer comment: guarding for lines and safety, initial posterior lean    Balance Overall balance assessment: Needs assistance Sitting-balance support: Feet supported;No upper extremity supported Sitting balance-Leahy Scale: Good     Standing balance support: During functional activity;Bilateral upper extremity supported Standing balance-Leahy Scale: Fair                             ADL either performed or assessed with clinical judgement   ADL Overall ADL's : Needs assistance/impaired     Grooming: Wash/dry hands;Standing;Supervision/safety                   Toilet Transfer: Min guard;Ambulation;Supervision/safety;RW   Toileting- Clothing Manipulation and Hygiene: Set up;Sitting/lateral lean       Functional mobility during  ADLs: Supervision/safety;Min guard;Rolling walker General ADL Comments: Pt completed toilet transfer, toileting tasks, and grooming task standing at sink. Pt preparing to walk with mobility tech.     Vision       Perception     Praxis      Cognition Arousal/Alertness: Awake/alert Behavior During Therapy: WFL for tasks assessed/performed Overall Cognitive Status: Within Functional Limits for tasks assessed                                          Exercises     Shoulder Instructions       General Comments  Discussed K+ levels with RN. Pt received K+ infusion prior to OT session.     Pertinent Vitals/ Pain       Pain Assessment: No/denies pain  Home Living                                          Prior Functioning/Environment              Frequency  Min 2X/week        Progress Toward Goals  OT Goals(current goals can now be found in the care plan section)  Progress towards OT goals: Progressing toward goals  Acute Rehab OT Goals Patient Stated Goal: return home and be more active  OT Goal Formulation: With patient Time For Goal Achievement: 05/15/19 Potential to Achieve Goals: Good ADL Goals Pt Will Perform Lower Body Dressing: with modified independence;sit to/from stand Additional ADL Goal #1: Pt will state 3 energy conservation techniques in order to carry over at home. Additional ADL Goal #2: Pt will stand x15 mins for ADL tasks with fair balance and modified independence  Plan Discharge plan remains appropriate    Co-evaluation                 AM-PAC OT "6 Clicks" Daily Activity     Outcome Measure   Help from another person eating meals?: None Help from another person taking care of personal grooming?: A Little Help from another person toileting, which includes using toliet, bedpan, or urinal?: A Little Help from another person bathing (including washing, rinsing, drying)?: A Little Help from another  person to put on and taking off regular upper body clothing?: None Help from another person to put on and taking off regular lower body clothing?: A Little 6 Click Score: 20    End of Session Equipment Utilized During Treatment: Gait belt;Rolling walker;Oxygen  OT Visit Diagnosis: Unsteadiness on feet (R26.81);Muscle weakness (generalized) (M62.81)   Activity Tolerance Patient tolerated treatment well   Patient Left in chair;with call bell/phone within reach(awaiting mobility tech)   Nurse Communication          Time: 1007-1219 OT Time Calculation (min): 22 min  Charges: OT General Charges $OT Visit: 1 Visit OT Treatments $Self Care/Home Management : 8-22 mins  Tyrone Schimke, OT Acute Rehabilitation Services Pager: [phone] Office: [phone]  Hortencia Pilar 05/03/2019, 11:27 AM

## 2019-05-03 NOTE — Progress Notes (Signed)
Patient ID: Mason Arnold, male   DOB: 1941/06/07, 78 y.o.   MRN: [id number]     Advanced Heart Failure Rounding Note  PCP-Cardiologist: No primary care provider on file.   Subjective:    Remains on milrinone for RV support in setting of rising creatinine and persistently high CVP. Co-ox stable at 67%.  Continues to diurese well but CVP 15. Weight down only one pound. Denies orthopnea or PND. No CP.  Rhythm stable    Objective:   Weight Range: 68.2 kg Body mass index is 25.01 kg/m.   Vital Signs:   Temp:  [97.4 F (36.3 C)-98.2 F (36.8 C)] 98.1 F (36.7 C) (05/26 0847) Pulse Rate:  [64-78] 66 (05/26 1018) Resp:  [12-20] 20 (05/26 1018) BP: (96-104)/(59-81) 104/70 (05/26 0847) SpO2:  [95 %-99 %] 95 % (05/26 1018) Weight:  [68.2 kg] 68.2 kg (05/26 0640) Last BM Date: 05/02/19  Weight change: Filed Weights   05/01/19 0623 05/02/19 0650 05/03/19 0640  Weight: 69.8 kg 68.5 kg 68.2 kg    Intake/Output:   Intake/Output Summary (Last 24 hours) at 05/03/2019 1132 Last data filed at 05/03/2019 1006 Gross per 24 hour  Intake 10 ml  Output 2075 ml  Net -2065 ml      Physical Exam    General:  Sitting up in bed. No resp difficulty HEENT: normal Neck: supple. JVP jaw. Carotids 2+ bilat; no bruits. No lymphadenopathy or thryomegaly appreciated. Cor: PMI nondisplaced. Regular rate & rhythm. 2/6 MR Lungs: clear Abdomen: soft, nontender, mildly distended. No hepatosplenomegaly. No bruits or masses. Good bowel sounds. Extremities: no cyanosis, clubbing, rash, edema Neuro: alert & orientedx3, cranial nerves grossly intact. moves all 4 extremities w/o difficulty. Affect pleasant   Telemetry   NSR with BiV pacing 60-70s with PVCs - about 5-10 per minute (personally reviewed)  Labs    CBC No results for input(s): WBC, NEUTROABS, HGB, HCT, MCV, PLT in the last 72 hours. Basic Metabolic Panel Recent Labs    05/02/19 1600 05/03/19 0500  NA 130* 130*  K 3.5 2.7*  CL 78*  77*  CO2 37* 37*  GLUCOSE 229* 186*  BUN 60* 61*  CREATININE 1.51* 1.40*  CALCIUM 9.4 9.6   Liver Function Tests No results for input(s): AST, ALT, ALKPHOS, BILITOT, PROT, ALBUMIN in the last 72 hours. No results for input(s): LIPASE, AMYLASE in the last 72 hours. Cardiac Enzymes No results for input(s): CKTOTAL, CKMB, CKMBINDEX, TROPONINI in the last 72 hours.  BNP: BNP (last 3 results) Recent Labs    02/02/19 1500 04/22/19 1330 04/24/19 1449  BNP 3,871.3* >4,500.0* 3,264.2*    ProBNP (last 3 results) No results for input(s): PROBNP in the last 8760 hours.   D-Dimer No results for input(s): DDIMER in the last 72 hours. Hemoglobin A1C No results for input(s): HGBA1C in the last 72 hours. Fasting Lipid Panel No results for input(s): CHOL, HDL, LDLCALC, TRIG, CHOLHDL, LDLDIRECT in the last 72 hours. Thyroid Function Tests No results for input(s): TSH, T4TOTAL, T3FREE, THYROIDAB in the last 72 hours.  Invalid input(s): FREET3  Other results:   Imaging    No results found.   Medications:     Scheduled Medications: . allopurinol  100 mg Oral QPM  . apixaban  5 mg Oral BID  . digoxin  125 mcg Oral Daily  . famotidine  10 mg Oral QAC supper  . FLUoxetine  20 mg Oral Daily  . insulin aspart  0-9 Units Subcutaneous TID WC  .  insulin glargine  5 Units Subcutaneous QHS  . metolazone  5 mg Oral Once  . potassium chloride  40 mEq Oral BID  . simvastatin  20 mg Oral q1800  . sodium chloride flush  10-40 mL Intracatheter Q12H  . spironolactone  25 mg Oral QPM    Infusions: . furosemide (LASIX) infusion 20 mg/hr (05/03/19 1006)  . milrinone 0.25 mcg/kg/min (05/02/19 2204)    PRN Medications: acetaminophen, albuterol, loratadine, nitroGLYCERIN, ondansetron (ZOFRAN) IV, polyvinyl alcohol, sodium chloride flush   Assessment/Plan   1. Acute on chronic systolic CHF: Echo (1/32) with EF 20-25%, severely decreased RV systolic function, severe MR. [employer] CRT-D.   Ischemic cardiomyopathy with significant co-existing RV failure.  This is now complicated as well by cardiorenal syndrome.  - Now on milrinone 0.25 Co-ox stable at 67% - Good response  To milrinone creatinine 1.6 -> 1.4 -> 1.4. Diuresis improving but still overloaded. Will continue milrinone an IV lasix.  - Continue Lasix gtt to 20 mg/hr and restart metolazone - Replace K aggressively. Will add diamox 250 bid as well - Can continue spironolactone for now but watch renal function and BP closely.  - Continue digoxin 0.125, level 0.9.   - Concerning situation.  HF worsening but think poor LVAD candidate with age, renal dysfunction, RV dysfunction.  Mitraclip may be a consideration. I have consulted the structural team.  - Will schedule TEE for tomorrow am  2. Mitral regurgitation: Severe on last echo.  Prominent murmur on exam.  Consult for MitralClip as above. Arrange TEE 3. CAD: s/p CABG. Cath in 2/20 with patent grafts.   - No ASA with Eliquis use.  - Continue simvastatin.   4. Atrial fibrillation: Paroxysmal, he is in NSR.   - Continue Eliquis.  5. 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  As above, likely cardiorenal. Needs diuresis, hopefully creatinine will continue to improve with fall in renal venous pressure and milrinone support.  6. Type 2 DM: Jardiance on hold with low GFR.   - Continue SSI.  7. H/o recurrent syncope: none recently.  8. Hypokalemia - supp K aggressively. Repeat BMET at 4pm. Discussed dosing with PharmD personally.   Length of Stay: Viola, MD  05/03/2019, 11:32 AM  Advanced Heart Failure Team Pager [phone] (M-F; 7a - 4p)  Please contact [facility] for night-coverage after hours (4p -7a ) and weekends on [email or web address]

## 2019-05-03 NOTE — Consult Note (Addendum)
[facility] VALVE TEAM  Inpatient MitraClip Consultation:   Patient ID: Mason Arnold; [id number]; 1941-02-10   Admit date: 04/24/2019 Date of Consult: 05/03/2019  Primary Care Provider: Binnie Rail, MD Primary Cardiologist: Glori Bickers, MD Primary Electrophysiologist: Dr. Caryl Comes  Patient Profile:   Mason Arnold is a 78 y.o. male with a hx of CAD s/p CABG x4V (2000), LBBB,  ICM (EF 20%) s/p St. Jude CRT-D, chronic systolic CHF, RV failure, PAF on Eliquis, HTN, HLD, IDDM, CKD stage III, recurrent syncope and severe MR who is being seen today for the evaluation of severe mitral regurgitation and candidacy for MitraClip at the request of Dr. Haroldine Laws.  History of Present Illness:   Mr. Maqueda lives in [place] with his wife. They had three children, but son died about three years ago. He has one daughter that lives in [place], [place]. He is a retired Radiographer, therapeutic and stopped working after his bypass surgery in 2000. He is able to ambulate without the aid of a cane or walker but has not been very active recently. He has not driven for the last couple years given previous issues with unexplained syncopal episodes. He spends his time watching TV, doing crossword puzzles and walking in his backyard with his dog. He used to play golf but stopped over a year ago due to progressive symptoms of heart failure as well as hypoglycemic episodes. Of note, he has his natural teeth and gets regular dental work.   He has a history of CAD and ischemic cardiomyopathy. He underwent emergent CABG with LIMA-LAD, SVG-ramus, SVG-OM3, SVG-PDA by Dr. Nils Pyle in 2000. He also has a history of ischemic cardiomyopathy. He has a BSX dual chamber ICD implanted 2002 for VT with gen change in 2010 to STJ CRTD without LV lead placement; LV lead placement 2014. He is followed by Dr. Caryl Comes for his device. He was previously followed by Dr. Stanford Breed, but then  transferred care to the advanced heart failure clinic with Dr. Haroldine Laws in 2015. He also has a history of recurrent syncope felt to be possibly be related to cough induced syncope. This has been quiescent for a number of years now.   He was admitted 2/27-02/06/19 for acute on chronic heart failure. [facility] 2/27 showed severe native 3V CAD with 4/4 patent bypass grafts, severe ICM with elevated filling pressures and moderately depressed CO, and moderate mixed pulmonary hypertension. He was short of breath and hypoxic after cath and admitted for IV diuresis. Echo during this admission showed EF 20-25%, with severe global HK w/ anterior/anteroseptal akinesis, moderately dilated LV with restrictive (G3DD) with elevated filling pressure, severely reduced RV systolic function, mild biatrial enlargement, severe eccentric and partially functional MR, mod TR, RVSP 48 mmHg and dilated IVC. He was diuresed with IV lasix and transitioned to Lasix 80 mg daily + metolazone 2.5 mg 2x/week. Entresto was decreased due to low BPs and dizziness. BB was stopped with marginal output and soft BP. He was set up for new home oxygen.   The patient has been followed closely in the advanced heart failure clinic. He reported worsening dyspnea and weight gain on 04/19/19 through a MyChart message. His diuretics were adjusted but the patient continued to have progressive symptoms and diuretic resistance. Outpatient labs revealed BNP >4500 and worsening renal function with creat up to 2.55. He was advised to present to the emergency department for admission. In the ER, CXR showed pulmonary vascular congestion. BNP 3,262, creat 2.32,  K 3.9, NA 131.   He was started on IV milrinone which was later discontinued and then restarted on 5/24 for RV support in the setting of rising creat and persistently elevated CVP. Creat has improved to 1.4 on milrinone. He is net negative 18L and 14 lbs. He remains volume overloaded, but feeling much improved. Per  Dr. Danelle Earthly, this is a difficult situation where his heart failure is worsening but felt to be a poor LVAD candidate with age, renal dysfunction, RV dysfunction. The structural heart team is consulted to discuss the possibility of MitraClip.   He is currently feeling much better. He denies chest pain, LE edema, orthopnea, bendopnea or PND. No recent dizziness or syncope. He had worsening abdominal distension and weight gain at home. He states that he has been having worsening dyspnea and fatigue since early May. He stays tired all the time. He is not able to take his dog outside or help with any household chores due to progressive symptoms.    Past Medical History:  Diagnosis Date   Acute on chronic combined systolic and diastolic CHF (congestive heart failure) (HCC)    CAD (coronary artery disease)    a. s/p CABG (2000)   Diabetes mellitus type II    IDDM , VAH   Gout    Hyperlipidemia    Hypertension    Ischemic heart disease    Left bundle branch block    Presence of cardiac resynchronization therapy defibrillator (CRT-D)    Ventricular tachycardia [facility])     Past Surgical History:  Procedure Laterality Date   A-V CARDIAC PACEMAKER INSERTION  2002; 2010   BIV ICD GENERATOR CHANGEOUT N/A 06/15/2017   Procedure: BiV ICD Generator Changeout;  Surgeon: Deboraha Sprang, MD;  Location: [facility] CV LAB;  Service: Cardiovascular;  Laterality: N/A;   BIV ICD GENERTAOR CHANGE OUT N/A 12/16/2012   Procedure: BIV ICD GENERTAOR CHANGE OUT;  Surgeon: Deboraha Sprang, MD;  Location: [facility] CATH LAB;  Service: Cardiovascular;  Laterality: N/A;   CARDIAC CATHETERIZATION     "3 or 4; never had interventions" (12/16/2012)   CARDIAC CATHETERIZATION N/A 04/11/2016   Procedure: Right Heart Cath;  Surgeon: Jolaine Artist, MD;  Location: [place] CV LAB;  Service: Cardiovascular;  Laterality: N/A;   CORONARY ARTERY BYPASS GRAFT  2000   CABG X4   INSERT / REPLACE / REMOVE PACEMAKER   12/16/2012   LV lead placement; ICD assessment    LEFT AND RIGHT HEART CATHETERIZATION WITH CORONARY/GRAFT ANGIOGRAM N/A 04/04/2014   Procedure: LEFT AND RIGHT HEART CATHETERIZATION WITH Beatrix Fetters;  Surgeon: Jolaine Artist, MD;  Location: [facility] CATH LAB;  Service: Cardiovascular;  Laterality: N/A;   RIGHT/LEFT HEART CATH AND CORONARY/GRAFT ANGIOGRAPHY N/A 02/03/2019   Procedure: RIGHT/LEFT HEART CATH AND CORONARY/GRAFT ANGIOGRAPHY;  Surgeon: Jolaine Artist, MD;  Location: [facility] CV LAB;  Service: Cardiovascular;  Laterality: N/A;   TONSILLECTOMY AND ADENOIDECTOMY  1954   URACHAL CYST EXCISION  ~ [place]  ?1967     Inpatient Medications: Scheduled Meds:  allopurinol  100 mg Oral QPM   apixaban  5 mg Oral BID   digoxin  125 mcg Oral Daily   famotidine  10 mg Oral QAC supper   FLUoxetine  20 mg Oral Daily   insulin aspart  0-9 Units Subcutaneous TID WC   insulin glargine  5 Units Subcutaneous QHS   potassium chloride  40 mEq Oral BID   potassium chloride  40 mEq Oral Once   simvastatin  20 mg Oral q1800   sodium chloride flush  10-40 mL Intracatheter Q12H   spironolactone  25 mg Oral QPM   Continuous Infusions:  furosemide (LASIX) infusion 20 mg/hr (05/02/19 2026)   milrinone 0.25 mcg/kg/min (05/02/19 2204)   PRN Meds: acetaminophen, albuterol, loratadine, nitroGLYCERIN, ondansetron (ZOFRAN) IV, polyvinyl alcohol, sodium chloride flush  Allergies:    Allergies  Allergen Reactions   Ampicillin Rash    Did it involve swelling of the face/tongue/throat, SOB, or low BP? No Did it involve sudden or severe rash/hives, skin peeling, or any reaction on the inside of your mouth or nose? Yes Did you need to seek medical attention at a hospital or doctor's office? Yes When did it last happen?25+ years If all above answers are NO, may proceed with cephalosporin use.     Crestor [Rosuvastatin Calcium] Rash and Other (See  Comments)    Rash as nodules ("like strawberries")   Orange Fruit [Citrus] Swelling and Other (See Comments)    Lips swelling, severe headache (including lemons and other citrus fruits)   Propoxyphene N-Acetaminophen Other (See Comments)    hallucinations  [darvicet]   Atorvastatin Other (See Comments)    Leg cramps    Social History:   Social History   Socioeconomic History   Marital status: Married    Spouse name: Not on file   Number of children: Not on file   Years of education: Not on file   Highest education level: Not on file  Occupational History   Not on file  Social Needs   Financial resource strain: Not on file   Food insecurity:    Worry: Not on file    Inability: Not on file   Transportation needs:    Medical: Not on file    Non-medical: Not on file  Tobacco Use   Smoking status: Never Smoker   Smokeless tobacco: Former Systems developer    Types: Snuff, Chew   Tobacco comment: 12/16/2012 "stopped chew/snuff in ~ 1992 after 15 yr"  Substance and Sexual Activity   Alcohol use: Yes    Alcohol/week: 7.0 standard drinks    Types: 7 Shots of liquor per week    Comment: one shot a day   Drug use: No    Comment: none   Sexual activity: Yes  Lifestyle   Physical activity:    Days per week: Not on file    Minutes per session: Not on file   Stress: Not on file  Relationships   Social connections:    Talks on phone: Not on file    Gets together: Not on file    Attends religious service: Not on file    Active member of club or organization: Not on file    Attends meetings of clubs or organizations: Not on file    Relationship status: Not on file   Intimate partner violence:    Fear of current or ex partner: Not on file    Emotionally abused: Not on file    Physically abused: Not on file    Forced sexual activity: Not on file  Other Topics Concern   Not on file  Social History Narrative   Lives with wife in [place].     Family History:     The patient's family history includes Aneurysm in his brother; Heart attack (age of onset: 57) in his father; Heart attack (age of onset: 71) in his maternal grandfather; Heart attack (age  of onset: 61) in his paternal grandfather; Heart attack (age of onset: 78) in his maternal grandmother; Heart failure (age of onset: 26) in his father. There is no history of Diabetes, Stroke, or Cancer.  ROS:  Please see the history of present illness.  ROS  All other ROS reviewed and negative.     Physical Exam/Data:   Vitals:   05/02/19 2034 05/02/19 2036 05/03/19 0640 05/03/19 0847  BP: 96/67  103/81 104/70  Pulse: 67 65 73 70  Resp: 20 18 17 12   Temp: 98.2 F (36.8 C)  98.1 F (36.7 C) 98.1 F (36.7 C)  TempSrc: Oral  Oral Oral  SpO2:  98%  98%  Weight:   68.2 kg   Height:        Intake/Output Summary (Last 24 hours) at 05/03/2019 0931 Last data filed at 05/03/2019 0634 Gross per 24 hour  Intake 10 ml  Output 2050 ml  Net -2040 ml   Filed Weights   05/01/19 0623 05/02/19 0650 05/03/19 0640  Weight: 69.8 kg 68.5 kg 68.2 kg   Body mass index is 25.01 kg/m.  General:  Well nourished, well developed, in no acute distress HEENT: normal Lymph: no adenopathy Neck: no JVD Endocrine:  No thryomegaly Vascular: No carotid bruits; FA pulses 2+ bilaterally without bruits  Cardiac:  normal S1, S2; RRR; soft holosystolic murmur at apex  Lungs:  clear to auscultation bilaterally, no wheezing, rhonchi or rales  Abd: mild abdominal distension  Ext: no edema Musculoskeletal:  No deformities, BUE and BLE strength normal and equal Skin: warm and dry  Neuro:  CNs 2-12 intact, no focal abnormalities noted Psych:  Normal affect   EKG:  The EKG was personally reviewed and demonstrates:  AV paced  Telemetry:  Telemetry was personally reviewed and demonstrates: sinus, PVCs, NSVT    Relevant CV Studies: Echo 02/04/19  IMPRESSIONS  1. The left ventricle has a 3D calculated ejection fraction. The  cavity size was moderately dilated. Left ventricular diastolic Doppler parameters are consistent with restrictive filling Elevated left atrial and left ventricular end-diastolic pressures  The E/e' is 30. Left ventricular diffuse hypokinesis.  2. The right ventricle has severely reduced systolic function. The cavity was mildly enlarged. There is no increase in right ventricular wall thickness.  3. Left atrial size was mildly dilated.  4. Right atrial size was mildly dilated.  5. Moderate mitral annular dilatation.  6. The mitral valve is degenerative. Moderate thickening of the mitral valve leaflet. Mitral valve regurgitation is severe by color flow Doppler. The MR jet is eccentric posteriorly directed.  7. The tricuspid valve is normal in structure. Tricuspid valve regurgitation is moderate.  8. The aortic valve is tricuspid Moderate thickening of the aortic valve Aortic valve regurgitation is mild by color flow Doppler. no stenosis of the aortic valve.  9. The pulmonic valve was grossly normal. Pulmonic valve regurgitation is mild by color flow Doppler. 10. The aortic root and ascending aorta are normal in size and structure. 11. The inferior vena cava was dilated in size with <50% respiratory variability. 12. The interatrial septum was not well visualized. SUMMARY LVEF 20-25%, severe global hypokinesis with anterior/anteroseptal akinesis, moderately dilated LV with normal wall thickness, restrictive (Grade 3) diastolic physiology with elevated LV filling pressure, mild biatrial enlargment, severe eccentric and partially functional MR, moderate TR, AICD wires noted, mild PI, RVSP 48 mmHg, dilated IVC  _______________   [facility] 02/03/19 Conclusion   Ost LAD lesion is 100% stenosed.  Prox Cx to Mid Cx lesion is 60% stenosed.  Ost 3rd Mrg lesion is 100% stenosed.  Ost Ramus lesion is 100% stenosed.  Mid RCA lesion is 70% stenosed.  Dist RCA lesion is 100% stenosed.  Origin to  Prox Graft lesion is 30% stenosed.  RPDA lesion is 90% stenosed.   Findings:  RA = 17 RV = 63/17 PA = 70/21 (42) PCW = 27 Fick cardiac output/index = 4.0/2.2 PVR = 3.7 WU FA sat = 97% PA sat = 67%, 68%  Assessment:  1. 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  2. We were unable to selectively engage LIMA due to subclavian tortuosity but non selective imaging shows it is widely patent 3. Severe ICM with elevated filling pressures and moderately depressed CO 4. Moderate mixed pulmonary HTN  Plan/Discussion:  Will attempt to diurese in short stay if responds well, we wil d/c home with increased diuretics and close f/u. If not responding will admit for IV diuresis.      Laboratory Data:  Chemistry Recent Labs  Lab 05/02/19 0507 05/02/19 1600 05/03/19 0500  NA 130* 130* 130*  K 2.8* 3.5 2.7*  CL 78* 78* 77*  CO2 38* 37* 37*  GLUCOSE 184* 229* 186*  BUN 58* 60* 61*  CREATININE 1.44* 1.51* 1.40*  CALCIUM 9.3 9.4 9.6  GFRNONAA 46* 43* 47*  GFRAA 53* 50* 55*  ANIONGAP 14 15 16*    No results for input(s): PROT, ALBUMIN, AST, ALT, ALKPHOS, BILITOT in the last 168 hours. HematologyNo results for input(s): WBC, RBC, HGB, HCT, MCV, MCH, MCHC, RDW, PLT in the last 168 hours. Cardiac EnzymesNo results for input(s): TROPONINI in the last 168 hours. No results for input(s): TROPIPOC in the last 168 hours.  BNPNo results for input(s): BNP, PROBNP in the last 168 hours.  DDimer No results for input(s): DDIMER in the last 168 hours.  Radiology/Studies:  No results found.   STS Risk Calculator:  Procedure: Isolated MVR  Risk of Mortality:  12.196%   Renal Failure:  6.455%   Permanent Stroke:  2.117%   Prolonged Ventilation:  33.496%   DSW Infection:  0.381%   Reoperation:  5.202%   Morbidity or Mortality:  41.944%   Short Length of Stay:  11.022%   Long Length of Stay:  25.153%    Procedure: MV Repair  Risk of Mortality:  12.810%   Renal Failure:  5.318%     Permanent Stroke:  3.829%   Prolonged Ventilation:  34.077%   DSW Infection:  0.199%   Reoperation:  8.646%   Morbidity or Mortality:  41.730%   Short Length of Stay:  12.382%   Long Length of Stay:  21.498%    Assessment and Plan:   Ahmaud Duthie is a 78 y.o. male with symptoms of severe, stage D mitral regurgitation with NYHA Class IV symptoms. I have reviewed the patient's recent echocardiogram which is notable for severely reduced LV systolic function, EF 24-09%, with severe global HK w/ anterior/anteroseptal akinesis, moderately dilated LV with restrictive (G3DD) with elevated filling pressure, severely reduced RV systolic function, mild biatrial enlargement, severe eccentric and partially functional MR, mod TR, RVSP 48 mmHg and dilated IVC. [facility] 2/27 showed severe native 3V CAD with 4/4 patent bypass grafts, severe ICM with elevated filling pressures and moderately depressed CO, and moderate mixed pulmonary hypertension. Plan is for TEE tomorrow.   I have reviewed the natural history of mitral regurgitation with the patient. We have discussed the limitations of  medical therapy and the poor prognosis associated with symptomatic mitral regurgitation. We have also reviewed potential treatment options, including palliative medical therapy, conventional surgical mitral valve repair or replacement, and percutaneous mitral valve repair with MitraClip. We discussed treatment options in the context of this patient's specific comorbid medical conditions.    The patient's predicted risk of mortality with conventional mitral valve replacement/repair is 12.196 % / 12.810 % respectively, primarily based on age, severe LV dysfunction, acute CHF, 3V CAD s/p bypass, IDDM, CKD, PAF, home 02. Other significant co morbidities include severe RV dysfunction and physical deconditioning. Plan for TEE tomorrow to see assess if anatomy favorable for MitraClip.  Dr. Burt Knack to follow.      Signed, Angelena Form, PA-C  05/03/2019 9:31 AM   Patient seen, examined. Available data reviewed. Agree with findings, assessment, and plan as outlined by Nell Range, PA-C.  The patient is independently interviewed and examined.  On my exam, he is an alert, oriented elderly male in no distress.  HEENT is normal, JVP is elevated with positive HJR, there are no carotid bruits.  Lung fields are clear.  Cardiac exam demonstrates a diffuse apical impulse, irregularly irregular, 3/6 holosystolic murmur at the left lower sternal border.  I do not appreciate an apical or axillary murmur.  Abdomen is soft and nontender.  The liver edge is palpable below the costal margin.  Extremities have no edema.  Skin with no rash.  Neurologic is grossly intact with strength 5/5 bilaterally.  The patient's extensive past medical history is outlined above and I have reviewed his most recent cardiac studies as well as pertinent history.  The patient has coronary disease status post remote bypass surgery with severe ischemic cardiomyopathy.  He has had progressive symptoms of heart failure, now clearly New [place] Heart Association functional class IV on IV milrinone with cardiorenal syndrome.  His most recent heart catheterization in February 2020 demonstrated elevated right and left heart filling pressures with cardiac index of 2.2.  He has severe native vessel CAD with patency of all bypass grafts.  The patient's echocardiogram from February 04, 2019 is reviewed.  This demonstrates severe biventricular dysfunction with LVEF reported at 20 to 25%.  The LV is moderately dilated and by my assessment his LVEF appears less than 20%.  He also has severe RV dysfunction and at least moderate tricuspid regurgitation.  Mitral regurgitation is at least 3+ and probably 4+ with an eccentric jet directed posteriorly.  As outlined above, the clinical implication of severe mitral regurgitation in the setting of chronic systolic heart failure is discussed  with the patient.  He will have a transesophageal echocardiogram performed tomorrow to further assess the functional significance of his mitral regurgitation.  Based on review of his surface echocardiogram, he likely has a combination of type I and type IIIb mitral valve dysfunction related to his severe cardiomyopathy.  We discussed potential treatment options, including palliative medical therapy, conventional surgical mitral valve repair or replacement, and transcatheter mitral valve repair.  It is clear the patient would not be a candidate for conventional surgery.  I think there remains some question whether he would benefit from transcatheter mitral valve repair and TEE will be helpful in determining both the severity and functional characteristics of his mitral valve anatomy.  I am concerned about the degree of his biventricular dysfunction and presence of cardiorenal syndrome on IV milrinone at present.  Will review further with the advanced heart failure team once his TEE is completed.  We will follow with you.  Sherren Mocha, M.D. 05/03/2019 6:24 PM

## 2019-05-03 NOTE — Progress Notes (Signed)
CRITICAL VALUE ALERT  Critical Value:  K+ 2.7  Date & Time Notied:  5/26 at 0540  Provider Notified: Yes, MD on call  Orders Received/Actions taken: pending

## 2019-05-04 ENCOUNTER — Encounter (HOSPITAL_COMMUNITY): Payer: Self-pay | Admitting: *Deleted

## 2019-05-04 ENCOUNTER — Encounter (HOSPITAL_COMMUNITY): Payer: Self-pay

## 2019-05-04 ENCOUNTER — Inpatient Hospital Stay (HOSPITAL_COMMUNITY): Payer: Medicare Other | Admitting: Certified Registered"

## 2019-05-04 ENCOUNTER — Encounter (HOSPITAL_COMMUNITY)
Admission: EM | Disposition: A | Discharge status: Hospice | Payer: Self-pay | Source: Home / Self Care | Attending: Internal Medicine

## 2019-05-04 ENCOUNTER — Inpatient Hospital Stay (HOSPITAL_COMMUNITY): Payer: Medicare Other

## 2019-05-04 DIAGNOSIS — Z515 Encounter for palliative care: Secondary | ICD-10-CM

## 2019-05-04 DIAGNOSIS — I340 Nonrheumatic mitral (valve) insufficiency: Secondary | ICD-10-CM

## 2019-05-04 DIAGNOSIS — R531 Weakness: Secondary | ICD-10-CM

## 2019-05-04 DIAGNOSIS — Z7189 Other specified counseling: Secondary | ICD-10-CM

## 2019-05-04 HISTORY — PX: TEE WITHOUT CARDIOVERSION: SHX5443

## 2019-05-04 LAB — BASIC METABOLIC PANEL
Anion gap: 17 — ABNORMAL HIGH (ref 5–15)
Anion gap: 15 (ref 5–15)
BUN: 64 mg/dL — ABNORMAL HIGH (ref 8–23)
BUN: 65 mg/dL — ABNORMAL HIGH (ref 8–23)
CO2: 37 mmol/L — ABNORMAL HIGH (ref 22–32)
CO2: 36 mmol/L — ABNORMAL HIGH (ref 22–32)
Calcium: 9.8 mg/dL (ref 8.9–10.3)
Calcium: 9.5 mg/dL (ref 8.9–10.3)
Chloride: 77 mmol/L — ABNORMAL LOW (ref 98–111)
Chloride: 79 mmol/L — ABNORMAL LOW (ref 98–111)
Creatinine, Ser: 1.53 mg/dL — ABNORMAL HIGH (ref 0.61–1.24)
Creatinine, Ser: 1.74 mg/dL — ABNORMAL HIGH (ref 0.61–1.24)
GFR calc Af Amer: 49 mL/min — ABNORMAL LOW (ref >60)
GFR calc Af Amer: 42 mL/min — ABNORMAL LOW (ref >60)
GFR calc non Af Amer: 43 mL/min — ABNORMAL LOW (ref >60)
GFR calc non Af Amer: 36 mL/min — ABNORMAL LOW (ref >60)
Glucose, Bld: 124 mg/dL — ABNORMAL HIGH (ref 70–99)
Glucose, Bld: 138 mg/dL — ABNORMAL HIGH (ref 70–99)
Potassium: 2.6 mmol/L — CL (ref 3.5–5.1)
Potassium: 2.7 mmol/L — CL (ref 3.5–5.1)
Sodium: 131 mmol/L — ABNORMAL LOW (ref 135–145)
Sodium: 130 mmol/L — ABNORMAL LOW (ref 135–145)

## 2019-05-04 LAB — CBC WITH DIFFERENTIAL/PLATELET
Abs Immature Granulocytes: 0.05 10*3/uL (ref 0.00–0.07)
Basophils Absolute: 0 10*3/uL (ref 0.0–0.1)
Basophils Relative: 0 %
Eosinophils Absolute: 0.3 10*3/uL (ref 0.0–0.5)
Eosinophils Relative: 3 %
HCT: 37.2 % — ABNORMAL LOW (ref 39.0–52.0)
Hemoglobin: 12.8 g/dL — ABNORMAL LOW (ref 13.0–17.0)
Immature Granulocytes: 1 %
Lymphocytes Relative: 8 %
Lymphs Abs: 0.7 10*3/uL (ref 0.7–4.0)
MCH: 31.2 pg (ref 26.0–34.0)
MCHC: 34.4 g/dL (ref 30.0–36.0)
MCV: 90.7 fL (ref 80.0–100.0)
Monocytes Absolute: 0.9 10*3/uL (ref 0.1–1.0)
Monocytes Relative: 10 %
Neutro Abs: 6.7 10*3/uL (ref 1.7–7.7)
Neutrophils Relative %: 78 %
Platelets: 144 10*3/uL — ABNORMAL LOW (ref 150–400)
RBC: 4.1 MIL/uL — ABNORMAL LOW (ref 4.22–5.81)
RDW: 14.8 % (ref 11.5–15.5)
WBC: 8.7 10*3/uL (ref 4.0–10.5)
nRBC: 0 % (ref 0.0–0.2)

## 2019-05-04 LAB — COOXEMETRY PANEL
Carboxyhemoglobin: 2.3 % — ABNORMAL HIGH (ref 0.5–1.5)
Methemoglobin: 0.8 % (ref 0.0–1.5)
O2 Saturation: 75.9 %
Total hemoglobin: 13 g/dL (ref 12.0–16.0)

## 2019-05-04 LAB — POCT I-STAT 4, (NA,K, GLUC, HGB,HCT)
Glucose, Bld: 119 mg/dL — ABNORMAL HIGH (ref 70–99)
HCT: 39 % (ref 39.0–52.0)
Hemoglobin: 13.3 g/dL (ref 13.0–17.0)
Potassium: 2.7 mmol/L — CL (ref 3.5–5.1)
Sodium: 130 mmol/L — ABNORMAL LOW (ref 135–145)

## 2019-05-04 LAB — GLUCOSE, CAPILLARY
Glucose-Capillary: 117 mg/dL — ABNORMAL HIGH (ref 70–99)
Glucose-Capillary: 182 mg/dL — ABNORMAL HIGH (ref 70–99)
Glucose-Capillary: 150 mg/dL — ABNORMAL HIGH (ref 70–99)

## 2019-05-04 LAB — MAGNESIUM: Magnesium: 2.2 mg/dL (ref 1.7–2.4)

## 2019-05-04 SURGERY — ECHOCARDIOGRAM, TRANSESOPHAGEAL
Anesthesia: Monitor Anesthesia Care

## 2019-05-04 MED ORDER — LIDOCAINE VISCOUS HCL 2 % MT SOLN
OROMUCOSAL | Status: AC
  Filled 2019-05-04: qty 15

## 2019-05-04 MED ORDER — POTASSIUM CHLORIDE 20 MEQ PO PACK
40.0000 meq | PACK | ORAL | Status: AC
  Administered 2019-05-04 (×2): 40 meq via ORAL
  Filled 2019-05-04 (×2): qty 2

## 2019-05-04 MED ORDER — POTASSIUM CHLORIDE 10 MEQ/50ML IV SOLN
10.0000 meq | INTRAVENOUS | Status: DC
  Administered 2019-05-04 (×2): 10 meq via INTRAVENOUS
  Filled 2019-05-04: qty 50

## 2019-05-04 MED ORDER — FENTANYL CITRATE (PF) 100 MCG/2ML IJ SOLN
INTRAMUSCULAR | Status: DC | PRN
  Administered 2019-05-04: 25 ug via INTRAVENOUS

## 2019-05-04 MED ORDER — FENTANYL CITRATE (PF) 100 MCG/2ML IJ SOLN
INTRAMUSCULAR | Status: AC
  Filled 2019-05-04: qty 4

## 2019-05-04 MED ORDER — MENTHOL 3 MG MT LOZG
1.0000 | LOZENGE | OROMUCOSAL | Status: DC | PRN
  Administered 2019-05-04: 3 mg via ORAL
  Filled 2019-05-04: qty 9

## 2019-05-04 MED ORDER — MIDAZOLAM HCL (PF) 5 MG/ML IJ SOLN
INTRAMUSCULAR | Status: AC
  Filled 2019-05-04: qty 2

## 2019-05-04 MED ORDER — LIDOCAINE VISCOUS HCL 2 % MT SOLN
OROMUCOSAL | Status: DC | PRN
  Administered 2019-05-04: 10 mL via OROMUCOSAL

## 2019-05-04 MED ORDER — MIDAZOLAM HCL (PF) 10 MG/2ML IJ SOLN
INTRAMUSCULAR | Status: DC | PRN
  Administered 2019-05-04: 1 mg via INTRAVENOUS
  Administered 2019-05-04: 2 mg via INTRAVENOUS

## 2019-05-04 NOTE — H&P (View-Only) (Signed)
Patient ID: Mason Arnold, male   DOB: 02/02/1941, 78 y.o.   MRN: [id number]     Advanced Heart Failure Rounding Note  PCP-Cardiologist: No primary care provider on file.   Subjective:    Remains on milrinone for RV support. Continues to diurese well though slowing down. Weight down another 3 pounds. 19 pounds total.   Breathing better. No orthopnea or PND. Slightly confused at times but nonfocal and can tell me who he is and describe TEE    Objective:   Weight Range: 66.7 kg Body mass index is 24.46 kg/m.   Vital Signs:   Temp:  [97.7 F (36.5 C)-98.5 F (36.9 C)] 98.5 F (36.9 C) (05/27 0730) Pulse Rate:  [66-81] 69 (05/27 0730) Resp:  [12-29] 20 (05/27 0730) BP: (95-107)/(54-73) 102/54 (05/27 0730) SpO2:  [85 %-100 %] 97 % (05/27 0730) Weight:  [66.7 kg] 66.7 kg (05/27 0656) Last BM Date: 05/03/19  Weight change: Filed Weights   05/02/19 0650 05/03/19 0640 05/04/19 0656  Weight: 68.5 kg 68.2 kg 66.7 kg    Intake/Output:   Intake/Output Summary (Last 24 hours) at 05/04/2019 0804 Last data filed at 05/04/2019 0506 Gross per 24 hour  Intake 379.11 ml  Output 1695 ml  Net -1315.89 ml      Physical Exam    General:  Sleeps but arousable. No resp difficulty HEENT: normal Neck: supple. JVP 10. Carotids 2+ bilat; no bruits. No lymphadenopathy or thryomegaly appreciated. Cor: PMI nondisplaced. Regular rate & rhythm. 2/6 MR Lungs: clear Abdomen: soft, nontender, nondistended. No hepatosplenomegaly. No bruits or masses. Good bowel sounds. Extremities: no cyanosis, clubbing, rash, edema Neuro: alert & orientedx3, cranial nerves grossly intact. moves all 4 extremities w/o difficulty. Affect pleasant    Telemetry   NSR with BiV pacing 60s0s with PVCs (personally reviewed)  Labs    CBC No results for input(s): WBC, NEUTROABS, HGB, HCT, MCV, PLT in the last 72 hours. Basic Metabolic Panel Recent Labs    05/03/19 0500 05/03/19 1538  NA 130* 133*  K 2.7*  3.0*  CL 77* 83*  CO2 37* 35*  GLUCOSE 186* 131*  BUN 61* 60*  CREATININE 1.40* 1.45*  CALCIUM 9.6 9.6   Liver Function Tests No results for input(s): AST, ALT, ALKPHOS, BILITOT, PROT, ALBUMIN in the last 72 hours. No results for input(s): LIPASE, AMYLASE in the last 72 hours. Cardiac Enzymes No results for input(s): CKTOTAL, CKMB, CKMBINDEX, TROPONINI in the last 72 hours.  BNP: BNP (last 3 results) Recent Labs    02/02/19 1500 04/22/19 1330 04/24/19 1449  BNP 3,871.3* >4,500.0* 3,264.2*    ProBNP (last 3 results) No results for input(s): PROBNP in the last 8760 hours.   D-Dimer No results for input(s): DDIMER in the last 72 hours. Hemoglobin A1C No results for input(s): HGBA1C in the last 72 hours. Fasting Lipid Panel No results for input(s): CHOL, HDL, LDLCALC, TRIG, CHOLHDL, LDLDIRECT in the last 72 hours. Thyroid Function Tests No results for input(s): TSH, T4TOTAL, T3FREE, THYROIDAB in the last 72 hours.  Invalid input(s): FREET3  Other results:   Imaging    No results found.   Medications:     Scheduled Medications: . [MAR Hold] acetaZOLAMIDE  500 mg Oral Q12H  . [MAR Hold] allopurinol  100 mg Oral QPM  . [MAR Hold] apixaban  5 mg Oral BID  . [MAR Hold] digoxin  125 mcg Oral Daily  . [MAR Hold] famotidine  10 mg Oral QAC supper  . [facility] Hold]  FLUoxetine  20 mg Oral Daily  . [MAR Hold] insulin aspart  0-9 Units Subcutaneous TID WC  . [MAR Hold] insulin glargine  5 Units Subcutaneous QHS  . [MAR Hold] potassium chloride  40 mEq Oral BID  . [MAR Hold] simvastatin  20 mg Oral q1800  . [MAR Hold] sodium chloride flush  10-40 mL Intracatheter Q12H  . [MAR Hold] spironolactone  25 mg Oral QPM    Infusions: . furosemide (LASIX) infusion 20 mg/hr (05/04/19 0037)  . milrinone 0.25 mcg/kg/min (05/03/19 1747)  . potassium chloride 10 mEq (05/04/19 0804)    PRN Medications: [MAR Hold] acetaminophen, [MAR Hold] albuterol, [MAR Hold] loperamide, [MAR  Hold] loratadine, [MAR Hold] nitroGLYCERIN, [MAR Hold] ondansetron (ZOFRAN) IV, [MAR Hold] polyvinyl alcohol, [MAR Hold] sodium chloride flush   Assessment/Plan   1. Acute on chronic systolic CHF: Echo (0/76) with EF 20-25%, severely decreased RV systolic function, severe MR. St Jude CRT-D.  Ischemic cardiomyopathy with significant co-existing RV failure.  This is now complicated as well by cardiorenal syndrome.  - Now on milrinone 0.25 Co-ox stable at 76% - Good response to milrinone. Creatinine pending. CVP 13-14. This may be as good as we can get him - Hold lasix and metolazone today - Replace K aggressively.Continue diamox 500 bid as well - Continue digoxin 0.125, level 0.9.   - Concerning situation.  HF worsening but think poor LVAD candidate with age, renal dysfunction, RV dysfunction.  Mitraclip may be a consideration. I have consulted the structural team. Appreciate their input.   - Will proceed with TEE this am  2. Mitral regurgitation: Severe on last echo.  Prominent murmur on exam.  Consult for MitralClip as above. Arrange TEE 3. CAD: s/p CABG. Cath in 2/20 with patent grafts.   - No ASA with Eliquis use.  - Continue simvastatin.   4. Atrial fibrillation: Paroxysmal, he is in NSR.   - Continue Eliquis.  5. 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   - Recheck BMET this am  6. Type 2 DM: Jardiance on hold with low GFR.   - Continue SSI.  7. H/o recurrent syncope: none recently.  8. Hypokalemia - supp K aggressively. Getting IV K piror to TEE 9. ID - with rising co-ox and confusion. I have some concern for pending infection - check CBC and bcx  Length of Stay: [place], MD  05/04/2019, 8:04 AM  Advanced Heart Failure Team Pager [phone] (M-F; 7a - 4p)  Please contact [facility] for night-coverage after hours (4p -7a ) and weekends on [email or web address]

## 2019-05-04 NOTE — Progress Notes (Signed)
CRITICAL VALUE ALERT  Critical Value:  K 2.7  Date & Time Notified:  5/27 0740 via istat  Provider Notified: Dr. Haroldine Laws  Orders Received/Actions taken: iv potassium runs ordered

## 2019-05-04 NOTE — Progress Notes (Signed)
  Echocardiogram 2D Echocardiogram has been performed.  Jannett Celestine 05/04/2019, 9:16 AM

## 2019-05-04 NOTE — Consult Note (Signed)
Consultation Note Date: 05/04/2019   Patient Name: Mason Arnold  DOB: 09-Feb-1941  MRN: [id number]  Age / Sex: 78 y.o., male  PCP: Binnie Rail, MD Referring Physician: Jolaine Artist, MD  Reason for Consultation: Establishing goals of care and Psychosocial/spiritual support  HPI/Patient Profile: 78 y.o. male   admitted on 04/24/2019 with a past medcieal history of CAD s/p CABG in 2000, ischemic cardiomyopathy with EF of 20% with St. Jude CRT-D, paroxysmal atrial fibrillation on Eliquis, hypertension, hyperlipidemia, and type 2 diabetes mellitus on Insulin who presented to the ED at the advice of Dr. Clayborne Dana office for 10 lb weight gain in 1 week despite increase in diuresis and worsening serum creatinine.  Currently on Milrinone, TEE today.  Long term poor prognosis.  Patient and his family face treatment option decisions, advanced directive decisions and anticipatory care needs.   Clinical Assessment and Goals of Care:   This NP Wadie Lessen reviewed medical records, received report from team, assessed the patient and then meet at the patient's bedside along with  to discuss diagnosis, prognosis, GOC, EOL wishes disposition and options.  Patient was too lethargic to engage in conversation.  Spoke to wife by phone to discuss above concepts.  She hopes to come to bedside to discuss GOC/EOL wishes at Dr Bensimhon's suggestion.  Permission cleared for visit in am by leadership/Anne Owens Shark.  Concept of Palliative Care was discussed   Values and goals of care important to patient and family were attempted to be elicited.   Questions and concerns addressed.   Family encouraged to call with questions or concerns.    PMT will continue to support holistically, and meet with wife in the am    [place]:  Full code   Palliative  Prophylaxis:   Delirium Protocol and Frequent Pain Assessment  Additional Recommendations (Limitations, Scope, Preferences):  Full Scope Treatment  Psycho-social/Spiritual:   Desire for further Chaplaincy support:no  Prognosis:   Less than 2 months  Discharge Planning: To Be Determined      Primary Diagnoses: Present on Admission: . Acute on chronic systolic heart failure ([place])   I have reviewed the medical record, interviewed the patient and family, and examined the patient. The following aspects are pertinent.  Past Medical History:  Diagnosis Date  . Acute on chronic combined systolic and diastolic CHF (congestive heart failure) ([place])   . CAD (coronary artery disease)    a. s/p CABG (2000)  . Diabetes mellitus type II    IDDM , [facility]  . Gout   . Hyperlipidemia   . Hypertension   . Ischemic heart disease   . Left bundle branch block   . Presence of cardiac resynchronization therapy defibrillator (CRT-D)   . Ventricular tachycardia (H[facility])    Social History   Socioeconomic History  . Marital status: Married    Spouse name: Not on file  . Number of children: Not on file  . Years of education: Not on file  .  Highest education level: Not on file  Occupational History  . Not on file  Social Needs  . Financial resource strain: Not on file  . Food insecurity:    Worry: Not on file    Inability: Not on file  . Transportation needs:    Medical: Not on file    Non-medical: Not on file  Tobacco Use  . Smoking status: Never Smoker  . Smokeless tobacco: Former Systems developer    Types: Snuff, Chew  . Tobacco comment: 12/16/2012 "stopped chew/snuff in ~ 1992 after 15 yr"  Substance and Sexual Activity  . Alcohol use: Yes    Alcohol/week: 7.0 standard drinks    Types: 7 Shots of liquor per week    Comment: one shot a day  . Drug use: No    Comment: none  . Sexual activity: Yes  Lifestyle  . Physical activity:    Days per week: Not on file    Minutes per session: Not on  file  . Stress: Not on file  Relationships  . Social connections:    Talks on phone: Not on file    Gets together: Not on file    Attends religious service: Not on file    Active member of club or organization: Not on file    Attends meetings of clubs or organizations: Not on file    Relationship status: Not on file  Other Topics Concern  . Not on file  Social History Narrative   Lives with wife in [place].    Family History  Problem Relation Age of Onset  . Heart attack Maternal Grandfather 78  . Heart attack Maternal Grandmother 82  . Heart attack Paternal Grandfather 69  . Heart attack Father 36  . Heart failure Father 51  . Aneurysm Brother   . Diabetes Neg Hx   . Stroke Neg Hx   . Cancer Neg Hx    Scheduled Meds: . acetaZOLAMIDE  500 mg Oral Q12H  . allopurinol  100 mg Oral QPM  . apixaban  5 mg Oral BID  . digoxin  125 mcg Oral Daily  . famotidine  10 mg Oral QAC supper  . FLUoxetine  20 mg Oral Daily  . insulin aspart  0-9 Units Subcutaneous TID WC  . insulin glargine  5 Units Subcutaneous QHS  . potassium chloride  40 mEq Oral BID  . simvastatin  20 mg Oral q1800  . sodium chloride flush  10-40 mL Intracatheter Q12H  . spironolactone  25 mg Oral QPM   Continuous Infusions: . milrinone 0.25 mcg/kg/min (05/03/19 1747)   PRN Meds:.acetaminophen, albuterol, loperamide, loratadine, nitroGLYCERIN, ondansetron (ZOFRAN) IV, polyvinyl alcohol, sodium chloride flush Medications Prior to Admission:  Prior to Admission medications   Medication Sig Start Date End Date Taking? Authorizing Provider  albuterol (PROVENTIL HFA;VENTOLIN HFA) 108 (90 Base) MCG/ACT inhaler Inhale 2 puffs into the lungs every 4 (four) hours as needed for wheezing. 12/02/18  Yes Marrian Salvage, FNP  allopurinol (ZYLOPRIM) 100 MG tablet Take 1 tablet (100 mg total) by mouth daily at 8 pm. (1900) Patient taking differently: Take 100 mg by mouth every evening.  09/28/17  Yes Burns, Claudina Lick, MD  calcium carbonate (OS-CAL) 600 MG TABS Take 600 mg by mouth daily after breakfast.    Yes [provider]  Carboxymethylcellulose Sodium (THERATEARS) 0.25 % SOLN Place 1 drop into both eyes 3 (three) times daily as needed (for dry eyes).    Yes [provider]  colchicine 0.6 MG tablet Take 0.6 mg by mouth daily as needed (gout flare up).    Yes [provider]  digoxin (LANOXIN) 0.125 MG tablet Take 1 tablet (125 mcg total) by mouth daily. 02/06/19  Yes Bhagat, Bhavinkumar, PA  ELIQUIS 5 MG TABS tablet TAKE 1 TABLET BY MOUTH TWICE A DAY Patient taking differently: Take 5 mg by mouth 2 (two) times daily.  06/13/16  Yes Clegg, Amy D, NP  empagliflozin (JARDIANCE) 10 MG TABS tablet Take 10 mg by mouth daily. Patient taking differently: Take 10 mg by mouth at bedtime.  02/20/19  Yes Burns, Claudina Lick, MD  famotidine (PEPCID) 10 MG tablet Take 10 mg by mouth daily before supper. 2 HRS BEFORE SUPPER.   Yes [provider]  FLUoxetine (PROZAC) 20 MG capsule Take 1 capsule (20 mg total) by mouth daily. 02/16/19  Yes Burns, Claudina Lick, MD  insulin glargine (LANTUS) 100 UNIT/ML injection Inject 10-15 Units into the skin at bedtime as needed (after eating a large meal). (2100)   Yes [provider]  loratadine (CLARITIN) 10 MG tablet Take 10 mg by mouth daily as needed for allergies.    Yes [provider]  metolazone (ZAROXOLYN) 5 MG tablet Take 1 tablet (5 mg total) by mouth 2 (two) times a week. Patient taking differently: Take 5 mg by mouth 2 (two) times a week. Nancy Fetter and Wed 03/17/19  Yes Bensimhon, Shaune Pascal, MD  Multiple Vitamin (MULTIVITAMIN) tablet Take 1 tablet by mouth daily after breakfast.    Yes [provider]  Omega-3 Fatty Acids (FISH OIL) 1000 MG CAPS Take 1,000 mg by mouth daily after breakfast.   Yes [provider]  potassium chloride SA (K-DUR,KLOR-CON) 20 MEQ tablet Take 2 tablets (40 mEq total) by mouth every morning AND 1  tablet (20 mEq total) every evening. ADDITONAL 40 MEQ WITH EVERY METOLAZONE DOSE. 03/14/19  Yes Bensimhon, Shaune Pascal, MD  sacubitril-valsartan (ENTRESTO) 24-26 MG Take 1 tablet by mouth 2 (two) times daily. 02/06/19  Yes Bhagat, Bhavinkumar, PA  spironolactone (ALDACTONE) 25 MG tablet Take 25 mg by mouth every evening.    Yes [provider]  torsemide (DEMADEX) 20 MG tablet Take 2 tablets (40 mg total) by mouth 2 (two) times daily. 03/17/19  Yes Bensimhon, Shaune Pascal, MD  furosemide (LASIX) 10 MG/ML solution Take 8 mLs (80 mg total) by mouth daily. Patient not taking: Reported on 04/24/2019 04/22/19   Georgiana Shore, NP   Allergies  Allergen Reactions  . Ampicillin Rash    Did it involve swelling of the face/tongue/throat, SOB, or low BP? No Did it involve sudden or severe rash/hives, skin peeling, or any reaction on the inside of your mouth or nose? Yes Did you need to seek medical attention at a hospital or doctor's office? Yes When did it last happen?25+ years If all above answers are "NO", may proceed with cephalosporin use.    Alveta Heimlich [Rosuvastatin Calcium] Rash and Other (See Comments)    Rash as nodules ("like strawberries")  . Orange Fruit [Citrus] Swelling and Other (See Comments)    Lips swelling, severe headache (including lemons and other citrus fruits)  . Propoxyphene N-Acetaminophen Other (See Comments)    hallucinations  [darvicet]  . Atorvastatin Other (See Comments)    Leg cramps   Review of Systems  Unable to perform ROS: Acuity of condition    Physical Exam Constitutional:      Appearance: He is normal weight. He is  ill-appearing.     Interventions: Nasal cannula in place.  Cardiovascular:     Rate and Rhythm: Normal rate and regular rhythm.  Skin:    General: Skin is warm and dry.  Neurological:     Mental Status: He is lethargic.     Vital Signs: BP 96/62   Pulse 70   Temp 98.1 F (36.7 C) (Oral)   Resp 20   Ht 5\' 5"  (1.651 m)   Wt 66.7  kg   SpO2 96%   BMI 24.46 kg/m  Pain Scale: Faces POSS *See Group Information*: 2-Acceptable,Slightly drowsy, easily aroused Pain Score: 0-No pain   SpO2: SpO2: 96 % O2 Device:SpO2: 96 % O2 Flow Rate: .O2 Flow Rate (L/min): 3 L/min  IO: Intake/output summary:   Intake/Output Summary (Last 24 hours) at 05/04/2019 1304 Last data filed at 05/04/2019 3212 Gross per 24 hour  Intake 158.61 ml  Output 1470 ml  Net -1311.39 ml    LBM: Last BM Date: 05/03/19 Baseline Weight: Weight: 74.5 kg Most recent weight: Weight: 66.7 kg     Palliative Assessment/Data: 30 % at best   Discussed with nursing staff  On 6E   Time In: 1345 Time Out: 1500 Time Total: 75 minutes Greater than 50%  of this time was spent counseling and coordinating care related to the above assessment and plan.  Signed by: Wadie Lessen, NP   Please contact Palliative Medicine Team phone at [phone] for questions and concerns.  For individual provider: See Shea Evans

## 2019-05-04 NOTE — Progress Notes (Signed)
Patient ID: Mason Arnold, male   DOB: Nov 24, 1941, 78 y.o.   MRN: [id number]     Advanced Heart Failure Rounding Note  PCP-Cardiologist: No primary care provider on file.   Subjective:    Remains on milrinone for RV support. Continues to diurese well though slowing down. Weight down another 3 pounds. 19 pounds total.   Breathing better. No orthopnea or PND. Slightly confused at times but nonfocal and can tell me who he is and describe TEE    Objective:   Weight Range: 66.7 kg Body mass index is 24.46 kg/m.   Vital Signs:   Temp:  [97.7 F (36.5 C)-98.5 F (36.9 C)] 98.5 F (36.9 C) (05/27 0730) Pulse Rate:  [66-81] 69 (05/27 0730) Resp:  [12-29] 20 (05/27 0730) BP: (95-107)/(54-73) 102/54 (05/27 0730) SpO2:  [85 %-100 %] 97 % (05/27 0730) Weight:  [66.7 kg] 66.7 kg (05/27 0656) Last BM Date: 05/03/19  Weight change: Filed Weights   05/02/19 0650 05/03/19 0640 05/04/19 0656  Weight: 68.5 kg 68.2 kg 66.7 kg    Intake/Output:   Intake/Output Summary (Last 24 hours) at 05/04/2019 0804 Last data filed at 05/04/2019 0506 Gross per 24 hour  Intake 379.11 ml  Output 1695 ml  Net -1315.89 ml      Physical Exam    General:  Sleeps but arousable. No resp difficulty HEENT: normal Neck: supple. JVP 10. Carotids 2+ bilat; no bruits. No lymphadenopathy or thryomegaly appreciated. Cor: PMI nondisplaced. Regular rate & rhythm. 2/6 MR Lungs: clear Abdomen: soft, nontender, nondistended. No hepatosplenomegaly. No bruits or masses. Good bowel sounds. Extremities: no cyanosis, clubbing, rash, edema Neuro: alert & orientedx3, cranial nerves grossly intact. moves all 4 extremities w/o difficulty. Affect pleasant    Telemetry   NSR with BiV pacing 60s0s with PVCs (personally reviewed)  Labs    CBC No results for input(s): WBC, NEUTROABS, HGB, HCT, MCV, PLT in the last 72 hours. Basic Metabolic Panel Recent Labs    05/03/19 0500 05/03/19 1538  NA 130* 133*  K 2.7*  3.0*  CL 77* 83*  CO2 37* 35*  GLUCOSE 186* 131*  BUN 61* 60*  CREATININE 1.40* 1.45*  CALCIUM 9.6 9.6   Liver Function Tests No results for input(s): AST, ALT, ALKPHOS, BILITOT, PROT, ALBUMIN in the last 72 hours. No results for input(s): LIPASE, AMYLASE in the last 72 hours. Cardiac Enzymes No results for input(s): CKTOTAL, CKMB, CKMBINDEX, TROPONINI in the last 72 hours.  BNP: BNP (last 3 results) Recent Labs    02/02/19 1500 04/22/19 1330 04/24/19 1449  BNP 3,871.3* >4,500.0* 3,264.2*    ProBNP (last 3 results) No results for input(s): PROBNP in the last 8760 hours.   D-Dimer No results for input(s): DDIMER in the last 72 hours. Hemoglobin A1C No results for input(s): HGBA1C in the last 72 hours. Fasting Lipid Panel No results for input(s): CHOL, HDL, LDLCALC, TRIG, CHOLHDL, LDLDIRECT in the last 72 hours. Thyroid Function Tests No results for input(s): TSH, T4TOTAL, T3FREE, THYROIDAB in the last 72 hours.  Invalid input(s): FREET3  Other results:   Imaging    No results found.   Medications:     Scheduled Medications: . [MAR Hold] acetaZOLAMIDE  500 mg Oral Q12H  . [MAR Hold] allopurinol  100 mg Oral QPM  . [MAR Hold] apixaban  5 mg Oral BID  . [MAR Hold] digoxin  125 mcg Oral Daily  . [MAR Hold] famotidine  10 mg Oral QAC supper  . [facility] Hold]  FLUoxetine  20 mg Oral Daily  . [MAR Hold] insulin aspart  0-9 Units Subcutaneous TID WC  . [MAR Hold] insulin glargine  5 Units Subcutaneous QHS  . [MAR Hold] potassium chloride  40 mEq Oral BID  . [MAR Hold] simvastatin  20 mg Oral q1800  . [MAR Hold] sodium chloride flush  10-40 mL Intracatheter Q12H  . [MAR Hold] spironolactone  25 mg Oral QPM    Infusions: . furosemide (LASIX) infusion 20 mg/hr (05/04/19 0037)  . milrinone 0.25 mcg/kg/min (05/03/19 1747)  . potassium chloride 10 mEq (05/04/19 0804)    PRN Medications: [MAR Hold] acetaminophen, [MAR Hold] albuterol, [MAR Hold] loperamide, [MAR  Hold] loratadine, [MAR Hold] nitroGLYCERIN, [MAR Hold] ondansetron (ZOFRAN) IV, [MAR Hold] polyvinyl alcohol, [MAR Hold] sodium chloride flush   Assessment/Plan   1. Acute on chronic systolic CHF: Echo (0/08) with EF 20-25%, severely decreased RV systolic function, severe MR. St Jude CRT-D.  Ischemic cardiomyopathy with significant co-existing RV failure.  This is now complicated as well by cardiorenal syndrome.  - Now on milrinone 0.25 Co-ox stable at 76% - Good response to milrinone. Creatinine pending. CVP 13-14. This may be as good as we can get him - Hold lasix and metolazone today - Replace K aggressively.Continue diamox 500 bid as well - Continue digoxin 0.125, level 0.9.   - Concerning situation.  HF worsening but think poor LVAD candidate with age, renal dysfunction, RV dysfunction.  Mitraclip may be a consideration. I have consulted the structural team. Appreciate their input.   - Will proceed with TEE this am  2. Mitral regurgitation: Severe on last echo.  Prominent murmur on exam.  Consult for MitralClip as above. Arrange TEE 3. CAD: s/p CABG. Cath in 2/20 with patent grafts.   - No ASA with Eliquis use.  - Continue simvastatin.   4. Atrial fibrillation: Paroxysmal, he is in NSR.   - Continue Eliquis.  5. 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   - Recheck BMET this am  6. Type 2 DM: Jardiance on hold with low GFR.   - Continue SSI.  7. H/o recurrent syncope: none recently.  8. Hypokalemia - supp K aggressively. Getting IV K piror to TEE 9. ID - with rising co-ox and confusion. I have some concern for pending infection - check CBC and bcx  Length of Stay: [place], MD  05/04/2019, 8:04 AM  Advanced Heart Failure Team Pager [phone] (M-F; 7a - 4p)  Please contact [facility] for night-coverage after hours (4p -7a ) and weekends on [email or web address]

## 2019-05-04 NOTE — CV Procedure (Signed)
    TRANSESOPHAGEAL ECHOCARDIOGRAM   NAME:  Mason Arnold   MRN: [id number] DOB:  05-Jun-1941   ADMIT DATE: 04/24/2019  INDICATIONS:  Mitral regurgitation  PROCEDURE:   Informed consent was obtained prior to the procedure. The risks, benefits and alternatives for the procedure were discussed and the patient comprehended these risks.  Risks include, but are not limited to, cough, sore throat, vomiting, nausea, somnolence, esophageal and stomach trauma or perforation, bleeding, low blood pressure, aspiration, pneumonia, infection, trauma to the teeth and death.    After a procedural time-out, the patient was given 3 mg versed and 25 mcg fentanyl for moderate sedation.  The oropharynx was anesthetized 10 cc of topical 1% viscous lidocaine.  The transesophageal probe was inserted in the esophagus and stomach without difficulty and multiple views were obtained.    COMPLICATIONS:    There were no immediate complications.  FINDINGS:  LEFT VENTRICLE: Severely dilated. Severe global HK. EF = 5-10%.  RIGHT VENTRICLE: Dilated. Severe global HK. + pacer wire  LEFT ATRIUM: Severely dilated 5.3cm  LEFT ATRIAL APPENDAGE: No thrombus.   RIGHT ATRIUM: Dilated. + pacer wire  AORTIC VALVE:  Trileaflet. Trivial AI  MITRAL VALVE:   Thin leaflets. Mild restriction of posterior leaflet with 3-4+ posterior eccentric MR  TRICUSPID VALVE: Moderate to severe 3-4+ TR  PULMONIC VALVE: Grossly normal.Trivial PI  INTERATRIAL SEPTUM: No PFO or ASD.  PERICARDIUM: No effusion  DESCENDING AORTA: Moderate to severe plaque   CONCLUSION:   Yara Tomkinson,MD 8:45 AM

## 2019-05-04 NOTE — Progress Notes (Signed)
Physical Therapy Treatment Patient Details Name: Mason Arnold MRN: [id number] DOB: 06-Sep-1941 Today's Date: 05/04/2019    History of Present Illness 78 y.o. male admitted with edema, volume overload and CHf exacerbation. PMHx: CAD s/p CABG in 2000, ischemic cardiomyopathy with EF of 20% with St. Jude CRT-D, paroxysmal atrial fibrillation on Eliquis, gout, hypertension, hyperlipidemia, and DM    PT Comments    Pt asleep on entry with RN in room, stating he has been sleeping since TEE this morning and it would be good for him to get up. Pt arouses with moderate verbal and tactile stimulus. Agrees to therapy, however requires greater assist today than prior session and ambulation is limited to walking around bed to recliner. Anticipate pt will be able to regain mobility once more awake. PT will continue to follow acutely.    Follow Up Recommendations  Home health PT     Equipment Recommendations  None recommended by PT       Precautions / Restrictions Precautions Precautions: Fall Restrictions Weight Bearing Restrictions: No    Mobility  Bed Mobility Overal bed mobility: Needs Assistance Bed Mobility: Supine to Sit     Supine to sit: Min assist     General bed mobility comments: min A for pulling against therapist and for pad scoot of hips to EoB  Transfers Overall transfer level: Needs assistance Equipment used: Rolling walker (2 wheeled) Transfers: Sit to/from Stand Sit to Stand: Min assist         General transfer comment: minA for power up and steadying, slightly tremulous in standing, able to march in place 10x and requests to sit, min A for second sit>stand before walking around bed  Ambulation/Gait Ambulation/Gait assistance: Min assist Gait Distance (Feet): 12 Feet Assistive device: Rolling walker (2 wheeled) Gait Pattern/deviations: Step-through pattern;Shuffle Gait velocity: slowed Gait velocity interpretation: <1.31 ft/sec, indicative of household  ambulator General Gait Details: minA for steadying as he walked around bed to sit in recliner, still slightly woozy from anesthesia      Balance Overall balance assessment: Needs assistance Sitting-balance support: Feet supported;No upper extremity supported Sitting balance-Leahy Scale: Fair     Standing balance support: During functional activity;Bilateral upper extremity supported Standing balance-Leahy Scale: Poor Standing balance comment: requires UE support                            Cognition Arousal/Alertness: Lethargic(s/p TEE) Behavior During Therapy: WFL for tasks assessed/performed Overall Cognitive Status: Within Functional Limits for tasks assessed                                        Exercises General Exercises - Lower Extremity Hip Flexion/Marching: AROM;Both;Other reps (comment);Standing;10 reps(25 reps) Other Exercises Other Exercises: sit to stand x 5 in 25 seconds with reliance on hands on armrest    General Comments General comments (skin integrity, edema, etc.): Pt maintains SaO2 >90% on 3L O2 HR max with ambulation 112 bpm      Pertinent Vitals/Pain Pain Assessment: No/denies pain           PT Goals (current goals can now be found in the care plan section) Acute Rehab PT Goals PT Goal Formulation: With patient Time For Goal Achievement: 05/09/19 Potential to Achieve Goals: Good Progress towards PT goals: Not progressing toward goals - comment(lethargic from procedure this morning.)  PT Plan Current plan remains appropriate       AM-PAC PT "6 Clicks" Mobility   Outcome Measure  Help needed turning from your back to your side while in a flat bed without using bedrails?: A Little Help needed moving from lying on your back to sitting on the side of a flat bed without using bedrails?: A Little Help needed moving to and from a bed to a chair (including a wheelchair)?: A Little Help needed standing up from a  chair using your arms (e.g., wheelchair or bedside chair)?: A Little Help needed to walk in hospital room?: A Little Help needed climbing 3-5 steps with a railing? : A Little 6 Click Score: 18    End of Session Equipment Utilized During Treatment: Gait belt;Oxygen Activity Tolerance: Patient tolerated treatment well Patient left: in chair;with call bell/phone within reach Nurse Communication: Mobility status PT Visit Diagnosis: Other abnormalities of gait and mobility (R26.89)     Time: 1420-1440 PT Time Calculation (min) (ACUTE ONLY): 20 min  Charges:  $Gait Training: 8-22 mins                     Joshlyn Beadle B. Migdalia Dk PT, DPT Acute Rehabilitation Services Pager [phone] Office [phone]    [place] 05/04/2019, 3:29 PM

## 2019-05-04 NOTE — Interval H&P Note (Signed)
History and Physical Interval Note:  05/04/2019 8:12 AM  Mason Arnold  has presented today for surgery, with the diagnosis of MR.  The various methods of treatment have been discussed with the patient and family. After consideration of risks, benefits and other options for treatment, the patient has consented to  Procedure(s): TRANSESOPHAGEAL ECHOCARDIOGRAM (TEE) (N/A) as a surgical intervention.  The patient's history has been reviewed, patient examined, no change in status, stable for surgery.  I have reviewed the patient's chart and labs.  Questions were answered to the patient's satisfaction.     Raea Magallon

## 2019-05-05 ENCOUNTER — Encounter (HOSPITAL_COMMUNITY): Payer: Self-pay | Admitting: Internal Medicine

## 2019-05-05 LAB — BASIC METABOLIC PANEL
Anion gap: 16 — ABNORMAL HIGH (ref 5–15)
BUN: 71 mg/dL — ABNORMAL HIGH (ref 8–23)
CO2: 32 mmol/L (ref 22–32)
Calcium: 9.4 mg/dL (ref 8.9–10.3)
Chloride: 84 mmol/L — ABNORMAL LOW (ref 98–111)
Creatinine, Ser: 1.86 mg/dL — ABNORMAL HIGH (ref 0.61–1.24)
GFR calc Af Amer: 39 mL/min — ABNORMAL LOW (ref >60)
GFR calc non Af Amer: 34 mL/min — ABNORMAL LOW (ref >60)
Glucose, Bld: 147 mg/dL — ABNORMAL HIGH (ref 70–99)
Potassium: 3.7 mmol/L (ref 3.5–5.1)
Sodium: 132 mmol/L — ABNORMAL LOW (ref 135–145)

## 2019-05-05 LAB — GLUCOSE, CAPILLARY
Glucose-Capillary: 134 mg/dL — ABNORMAL HIGH (ref 70–99)
Glucose-Capillary: 205 mg/dL — ABNORMAL HIGH (ref 70–99)
Glucose-Capillary: 178 mg/dL — ABNORMAL HIGH (ref 70–99)
Glucose-Capillary: 146 mg/dL — ABNORMAL HIGH (ref 70–99)

## 2019-05-05 LAB — COOXEMETRY PANEL
Carboxyhemoglobin: 2 % — ABNORMAL HIGH (ref 0.5–1.5)
Methemoglobin: 1.4 % (ref 0.0–1.5)
O2 Saturation: 66.8 %
Total hemoglobin: 13.6 g/dL (ref 12.0–16.0)

## 2019-05-05 LAB — AMMONIA: Ammonia: 59 umol/L — ABNORMAL HIGH (ref 9–35)

## 2019-05-05 MED ORDER — FUROSEMIDE 10 MG/ML IJ SOLN
20.0000 mg/h | INTRAVENOUS | Status: DC
  Administered 2019-05-05 – 2019-05-06 (×2): 20 mg/h via INTRAVENOUS
  Filled 2019-05-05: qty 21
  Filled 2019-05-05: qty 25

## 2019-05-05 MED ORDER — METOLAZONE 5 MG PO TABS
5.0000 mg | ORAL_TABLET | Freq: Every day | ORAL | Status: DC
  Administered 2019-05-05 – 2019-05-06 (×2): 5 mg via ORAL
  Filled 2019-05-05 (×2): qty 1

## 2019-05-05 MED ORDER — POTASSIUM CHLORIDE CRYS ER 20 MEQ PO TBCR
20.0000 meq | EXTENDED_RELEASE_TABLET | Freq: Once | ORAL | Status: AC
  Filled 2019-05-05: qty 1

## 2019-05-05 NOTE — Progress Notes (Signed)
Patient ID: Mason Arnold, male   DOB: 12/06/1941, 78 y.o.   MRN: [id number]     Advanced Heart Failure Rounding Note  PCP-Cardiologist: No primary care provider on file.   Subjective:    Remains on milrinone for RV support. Results of TEE noted with EF 5-10% and 3-4+ MR. Severe RV dysfunction.  Off lasix overnight.   Remains sleepy but awakens and can have full discussion. CVP 20. Denies SOB, orthopnea or PND.    Objective:   Weight Range: 66.7 kg Body mass index is 24.46 kg/m.   Vital Signs:   Temp:  [97.5 F (36.4 C)-98.2 F (36.8 C)] 97.5 F (36.4 C) (05/28 1136) Pulse Rate:  [64-69] 69 (05/28 1136) Resp:  [17-22] 22 (05/28 1136) BP: (97-112)/(65-75) 112/75 (05/28 1136) SpO2:  [92 %-97 %] 97 % (05/28 1136) Last BM Date: 05/03/19  Weight change: Filed Weights   05/02/19 0650 05/03/19 0640 05/04/19 0656  Weight: 68.5 kg 68.2 kg 66.7 kg    Intake/Output:   Intake/Output Summary (Last 24 hours) at 05/05/2019 1702 Last data filed at 05/05/2019 1335 Gross per 24 hour  Intake 539.36 ml  Output 650 ml  Net -110.64 ml      Physical Exam    General:  Sleepy but arousable. No resp difficulty HEENT: normal Neck: supple.JVP to ear Carotids 2+ bilat; no bruits. No lymphadenopathy or thryomegaly appreciated. Cor: PMI nondisplaced. Regular rate & rhythm. 2/6 MR/TR Lungs: clear Abdomen: soft, nontender, nondistended. No hepatosplenomegaly. No bruits or masses. Good bowel sounds.+ pulsatile liver Extremities: no cyanosis, clubbing, rash, edema Neuro: sleepy but arousable and oriented  cranial nerves grossly intact. moves all 4 extremities w/o difficulty. Affect pleasant   Telemetry   NSR with BiV pacing 60-70s with PVCs (personally reviewed)  Labs    CBC Recent Labs    05/04/19 0740 05/04/19 0949  WBC  --  8.7  NEUTROABS  --  6.7  HGB 13.3 12.8*  HCT 39.0 37.2*  MCV  --  90.7  PLT  --  585*   Basic Metabolic Panel Recent Labs    05/04/19 0740  05/04/19 1430 05/05/19 0500  NA 131*  130* 130* 132*  K 2.6*  2.7* 2.7* 3.7  CL 77* 79* 84*  CO2 37* 36* 32  GLUCOSE 124*  119* 138* 147*  BUN 64* 65* 71*  CREATININE 1.53* 1.74* 1.86*  CALCIUM 9.8 9.5 9.4  MG 2.2  --   --    Liver Function Tests No results for input(s): AST, ALT, ALKPHOS, BILITOT, PROT, ALBUMIN in the last 72 hours. No results for input(s): LIPASE, AMYLASE in the last 72 hours. Cardiac Enzymes No results for input(s): CKTOTAL, CKMB, CKMBINDEX, TROPONINI in the last 72 hours.  BNP: BNP (last 3 results) Recent Labs    02/02/19 1500 04/22/19 1330 04/24/19 1449  BNP 3,871.3* >4,500.0* 3,264.2*    ProBNP (last 3 results) No results for input(s): PROBNP in the last 8760 hours.   D-Dimer No results for input(s): DDIMER in the last 72 hours. Hemoglobin A1C No results for input(s): HGBA1C in the last 72 hours. Fasting Lipid Panel No results for input(s): CHOL, HDL, LDLCALC, TRIG, CHOLHDL, LDLDIRECT in the last 72 hours. Thyroid Function Tests No results for input(s): TSH, T4TOTAL, T3FREE, THYROIDAB in the last 72 hours.  Invalid input(s): FREET3  Other results:   Imaging    No results found.   Medications:     Scheduled Medications: . acetaZOLAMIDE  500 mg Oral Q12H  .  allopurinol  100 mg Oral QPM  . apixaban  5 mg Oral BID  . digoxin  125 mcg Oral Daily  . famotidine  10 mg Oral QAC supper  . FLUoxetine  20 mg Oral Daily  . insulin aspart  0-9 Units Subcutaneous TID WC  . insulin glargine  5 Units Subcutaneous QHS  . metolazone  5 mg Oral Daily  . potassium chloride  40 mEq Oral BID  . simvastatin  20 mg Oral q1800  . sodium chloride flush  10-40 mL Intracatheter Q12H  . spironolactone  25 mg Oral QPM    Infusions: . furosemide (LASIX) infusion 20 mg/hr (05/05/19 1614)  . milrinone 0.25 mcg/kg/min (05/05/19 0300)    PRN Medications: acetaminophen, albuterol, loperamide, loratadine, menthol-cetylpyridinium, nitroGLYCERIN,  ondansetron (ZOFRAN) IV, polyvinyl alcohol, sodium chloride flush   Assessment/Plan   1. Acute on chronic systolic CHF: Echo (3/95) with EF 20-25%, severely decreased RV systolic function, severe MR. St Jude CRT-D.  Ischemic cardiomyopathy with significant co-existing RV failure.  This is now complicated as well by cardiorenal syndrome.  - EF 5-10% by TEE 5/27 severe RV dysfunction 3-4+MR - Now on milrinone 0.25 Co-ox stable at 67% - After reviewing TEE with structural heart team, we all believe that he likely has end-stage biventricular HF and has had little benefit from milrinone and would likely not benefit from MitraClip at this point - Long discussion with family at bedside and Palliative care team  - We have decided about switch to [place].  - We will continue mirlinone overnight and attempt to diurese as much as possible prior to d/c - Have asked MDT rep to deactivate ICD - Plan home with Hospice tomorrow or Saturday  2. Mitral regurgitation:  - as above 3. CAD: s/p CABG. Cath in 2/20 with patent grafts.   - No ASA with Eliquis use.  - Continue simvastatin.   4. Atrial fibrillation: Paroxysmal, he is in NSR.   - Continue Eliquis.  5. 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   6. Type 2 DM: Jardiance on hold with low GFR.   - Continue SSI.  7. H/o recurrent syncope: none recently.  8. Hypokalemia - supp K aggressively.    Length of Stay: 11  Glori Bickers, MD  05/05/2019, 5:02 PM  Advanced Heart Failure Team Pager [phone] (M-F; [place])  Please contact [facility] for night-coverage after hours (4p -7a ) and weekends on [email or web address]

## 2019-05-05 NOTE — Progress Notes (Signed)
Occupational Therapy Treatment Patient Details Name: Mason Arnold MRN: [id number] DOB: 1941-01-07 Today's Date: 05/05/2019    History of present illness 78 y.o. male admitted with edema, volume overload and CHf exacerbation. PMHx: CAD s/p CABG in 2000, ischemic cardiomyopathy with EF of 20% with St. Jude CRT-D, paroxysmal atrial fibrillation on Eliquis, gout, hypertension, hyperlipidemia, and DM   OT comments  Pt progressing well with therapy. Pt ambulating in room with family present encouraging him. Pt performing grooming at sink x3 mins of standing with BLEs shaking and a tad tremulous so pt sat for remainder of grooming tasks. Pt performing tasks with increased time and SOB noted, OT remained >90% when pulse ox had a good reading. Pt appropriate for HHOT and family agrees. OT given energy conservation handout to family and spouse began reading out loud. OT to follow acutely.    Follow Up Recommendations  Home health OT;Supervision - Intermittent    Equipment Recommendations  None recommended by OT    Recommendations for Other Services      Precautions / Restrictions Precautions Precautions: Fall Restrictions Weight Bearing Restrictions: No       Mobility Bed Mobility Overal bed mobility: Needs Assistance Bed Mobility: Supine to Sit     Supine to sit: Min assist     General bed mobility comments: min A for pulling against therapist and for pad scoot of hips to EoB  Transfers Overall transfer level: Needs assistance Equipment used: Rolling walker (2 wheeled) Transfers: Sit to/from [facility] Sit to Stand: Min assist Stand pivot transfers: Min assist       General transfer comment: minA for power up and steadying, slightly tremulous in standing.    Balance Overall balance assessment: Needs assistance Sitting-balance support: Feet supported;No upper extremity supported Sitting balance-Leahy Scale: Fair     Standing balance support: During  functional activity;Bilateral upper extremity supported Standing balance-Leahy Scale: Poor Standing balance comment: requires UE support                           ADL either performed or assessed with clinical judgement   ADL Overall ADL's : Needs assistance/impaired     Grooming: Wash/dry hands;Wash/dry face;Oral care;Applying deodorant;Brushing hair;Min guard;Sitting;Standing                               Functional mobility during ADLs: Min guard;Rolling walker General ADL Comments: Pt managing using urinal in sitting with set-upA; standing with grooming, but BLEs were bounding up and down due to weakness so pt fat for the remainder     Vision       Perception     Praxis      Cognition Arousal/Alertness: Lethargic Behavior During Therapy: WFL for tasks assessed/performed Overall Cognitive Status: Within Functional Limits for tasks assessed                                          Exercises     Shoulder Instructions       General Comments O2 >90% on 3L O2 bumped up to 4L as O2 was nto reading, but steadily reading eventually afterwards on 3L >95% O2.    Pertinent Vitals/ Pain       Pain Assessment: No/denies pain  Home Living  Prior Functioning/Environment              Frequency  Min 2X/week        Progress Toward Goals  OT Goals(current goals can now be found in the care plan section)  Progress towards OT goals: Progressing toward goals  Acute Rehab OT Goals Patient Stated Goal: return home and be more active OT Goal Formulation: With patient Time For Goal Achievement: 05/15/19 Potential to Achieve Goals: Good ADL Goals Pt Will Perform Lower Body Dressing: with modified independence;sit to/from stand Additional ADL Goal #1: Pt will state 3 energy conservation techniques in order to carry over at home. Additional ADL Goal #2: Pt will stand x15  mins for ADL tasks with fair balance and modified independence  Plan Discharge plan needs to be updated    Co-evaluation                 AM-PAC OT "6 Clicks" Daily Activity     Outcome Measure   Help from another person eating meals?: None Help from another person taking care of personal grooming?: A Little Help from another person toileting, which includes using toliet, bedpan, or urinal?: A Little Help from another person bathing (including washing, rinsing, drying)?: A Little Help from another person to put on and taking off regular upper body clothing?: None Help from another person to put on and taking off regular lower body clothing?: A Little 6 Click Score: 20    End of Session Equipment Utilized During Treatment: Gait belt;Rolling walker;Oxygen  OT Visit Diagnosis: Unsteadiness on feet (R26.81);Muscle weakness (generalized) (M62.81)   Activity Tolerance Patient tolerated treatment well   Patient Left in chair;with call bell/phone within reach;with chair alarm set   Nurse Communication Mobility status        Time: 6384-6659 OT Time Calculation (min): 54 min  Charges: OT General Charges $OT Visit: 1 Visit OT Treatments $Self Care/Home Management : 38-52 mins $Therapeutic Activity: 8-22 mins   Darryl Nestle) Marsa Aris OTR/L Acute Rehabilitation Services Pager: [phone] Office: [place] 05/05/2019, 3:54 PM

## 2019-05-05 NOTE — Progress Notes (Signed)
Patient ID: Mason Arnold, male   DOB: Aug 14, 1941, 78 y.o.   MRN: [id number]  This NP visited patient at the bedside as a follow up to  yesterday's Mason Arnold.    I arranged with nursing leadership to have patient's wife and daughter at bedside for a full conversation regarding diagnosis/treatment options, prognosis, goals of care, end-of-life wishes, disposition and option.  Dr Mason Arnold to come to bedside to discuss plan of care with patient t and his family.  Milrinone is not a consideration for home discharge.  Plan is to maximize and stabilize patient prior to discharge, likely Friday or Saturday.    Plan of Care: -DNR/DNI/de-activate AICD -continue to maximize oral medications at home -hospice services- prognosis is likely less that six months -goal is comfort and dignity at home  Discussed with patient the importance of continued conversation with his family and their  medical providers regarding overall plan of care and treatment options,  ensuring decisions are within the context of the patients values and GOCs.  Questions and concerns addressed   Discussed with Dr Mason Arnold  Total time spent on the unit was 60 minutes  Greater than 50% of the time was spent in counseling and coordination of care  Wadie Lessen NP  Palliative Medicine Team Team Phone # [phone] Pager [phone]

## 2019-05-05 NOTE — TOC Initial Note (Signed)
Transition of Care [facility]) - Initial/Assessment Note    Patient Details  Name: Mason Arnold MRN: [id number] Date of Birth: 03-03-1941  [facility]) CM/SW Contact:    Bethena Roys, RN Phone Number: 05/05/2019, 3:06 PM  Clinical Narrative: CM received consult for Home Hospice. CM did speak with patient and daughter, wife was getting some lunch at time of visit. Patient and family wanted to use [facility]- referral sent to [facility]. Patient wants DME 3n1 and [facility] usually uses [facility] for DME. Patient has DME RW and Oxygen in the home. Per daughter if patient is able to transport via car, they will do so. CM will assess need for ambulance transport on day of transition. CM will continue to monitor for additional transition of care needs.               Expected Discharge Plan: Home w Hospice Care Barriers to Discharge: No Barriers Identified   Patient Goals and CMS Choice Patient states their goals for this hospitalization and ongoing recovery are:: "to be comfortable" CMS Medicare.gov Compare Post Acute Care list provided to:: Patient Choice offered to / list presented to : Spouse, Patient  Expected Discharge Plan and Services Expected Discharge Plan: [place] In-house Referral: NA Discharge Planning Services: CM Consult Post Acute Care Choice: Durable Medical Equipment, Hospice Living arrangements for the past 2 months: Single Family Home                 DME Arranged: N/A DME Agency: NA       HH Arranged: RN [place] Agency: Hospice and [place] Date [place]: 05/05/19 Time HH Agency Contacted: 87 Representative spoke with at [place]: Venia Carbon  Prior Living Arrangements/Services Living arrangements for the past 2 months: [place] Lives with:: Spouse Patient language and need for interpreter reviewed:: Yes Do you feel safe going back to the place where you live?: Yes       Need for Family Participation in Patient Care: Yes (Comment) Care giver support system in place?: Yes (comment) Current home services: DME(Pt has RW and 02 in the home. ) Criminal Activity/Legal Involvement Pertinent to Current Situation/Hospitalization: No - Comment as needed  Activities of Daily Living Home Assistive Devices/Equipment: Grab bars in shower, Shower chair without back, Eyeglasses, Oxygen, Raised toilet seat with rails, Walker (specify type)(front wheel walker) ADL Screening (condition at time of admission) Patient's cognitive ability adequate to safely complete daily activities?: Yes Is the patient deaf or have difficulty hearing?: No Does the patient have difficulty seeing, even when wearing glasses/contacts?: No Does the patient have difficulty concentrating, remembering, or making decisions?: No Patient able to express need for assistance with ADLs?: Yes Does the patient have difficulty dressing or bathing?: No Independently performs ADLs?: Yes (appropriate for developmental age) Does the patient have difficulty walking or climbing stairs?: No Weakness of Legs: None Weakness of Arms/Hands: None  Permission Sought/Granted Permission sought to share information with : Family Supports Permission granted to share information with : Yes, Verbal Permission Granted              Emotional Assessment Appearance:: Appears stated age Attitude/Demeanor/Rapport: Engaged, Gracious Affect (typically observed): Accepting Orientation: : Oriented to Situation, Oriented to Self, Oriented to Place, Oriented to  Time Alcohol / Substance Use: Not Applicable Psych Involvement: No (comment)  Admission diagnosis:  Acute on chronic systolic congestive heart failure ([place]) [I50.23] Patient Active Problem List   Diagnosis Date  Noted  . Palliative care by specialist   . DNR (do not resuscitate) discussion   . Weakness generalized   . Hypoxia, secondary to heart failure 02/16/2019  .  Acute on chronic systolic heart failure ([place]) 02/13/2019  . Systolic CHF ([place]) 17/91/5056  . Syncope 06/29/2013  . Ventricular tachycardia ([place]) 06/29/2013  . Depression 11/30/2012  . 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 Atrial fibrillation ([place]) 02/03/2011  . Chronic systolic heart failure ([place]) 10/01/2010  . Coronary atherosclerosis 06/20/2010  . Cardiomyopathy, ischemic 06/12/2009  . HYPERLIPIDEMIA 02/11/2008  . ORTHOSTATIC HYPOTENSION 02/11/2008  . Diabetes ([place]) 10/18/2007  . GOUT 03/26/2007   PCP:  Binnie Rail, MD Pharmacy:   [facility] # [address] [place] [address] [place] [address] Phone: [phone] Fax: [phone]  [place], [place] [address] Phone: [phone] Fax: [phone]     Social Determinants of Health (SDOH) Interventions    Readmission Risk Interventions No flowsheet data found.

## 2019-05-05 NOTE — Progress Notes (Signed)
[employer] Rep notified to deactivate AICD per MD order. Per Rep, Pt has St Jude device. St Jude Rep notified.

## 2019-05-05 NOTE — Consult Note (Signed)
   [facility] Inpatient Consult   05/05/2019  Jazmin Vensel [birth year] [id number]    Notification from inpatient [facility] RNCM that the patient will transition home with [facility] for hospice care.  Patient with [employer] with Dr. Billey Gosling is the primary care provider.  Dr. Glori Bickers is the primary cardiologist. Chart reviewed and reveals the patient is currently transitioning to Hospice/Palliative Care. Given this choice patient will have full case management services through Hospice. No [facility] Care Management is assessed for this transition.  Thanks,  Natividad Brood, RN BSN [place] [facility] Liaison  [phone] business mobile phone Toll free office [phone]  Fax number: [phone] [email or web address]

## 2019-05-05 NOTE — Care Management Important Message (Signed)
Important Message  Patient Details  Name: Mason Arnold MRN: [id number] Date of Birth: 08-11-1941   Medicare Important Message Given:  Yes    Orbie Pyo 05/05/2019, 4:28 PM

## 2019-05-06 ENCOUNTER — Telehealth: Payer: Self-pay

## 2019-05-06 LAB — BASIC METABOLIC PANEL
Anion gap: 16 — ABNORMAL HIGH (ref 5–15)
Anion gap: 17 — ABNORMAL HIGH (ref 5–15)
BUN: 62 mg/dL — ABNORMAL HIGH (ref 8–23)
BUN: 62 mg/dL — ABNORMAL HIGH (ref 8–23)
CO2: 31 mmol/L (ref 22–32)
CO2: 32 mmol/L (ref 22–32)
Calcium: 9.5 mg/dL (ref 8.9–10.3)
Calcium: 9.7 mg/dL (ref 8.9–10.3)
Chloride: 81 mmol/L — ABNORMAL LOW (ref 98–111)
Chloride: 79 mmol/L — ABNORMAL LOW (ref 98–111)
Creatinine, Ser: 1.72 mg/dL — ABNORMAL HIGH (ref 0.61–1.24)
Creatinine, Ser: 1.79 mg/dL — ABNORMAL HIGH (ref 0.61–1.24)
GFR calc Af Amer: 43 mL/min — ABNORMAL LOW (ref >60)
GFR calc Af Amer: 41 mL/min — ABNORMAL LOW (ref >60)
GFR calc non Af Amer: 37 mL/min — ABNORMAL LOW (ref >60)
GFR calc non Af Amer: 35 mL/min — ABNORMAL LOW (ref >60)
Glucose, Bld: 169 mg/dL — ABNORMAL HIGH (ref 70–99)
Glucose, Bld: 180 mg/dL — ABNORMAL HIGH (ref 70–99)
Potassium: 2.7 mmol/L — CL (ref 3.5–5.1)
Potassium: 3.1 mmol/L — ABNORMAL LOW (ref 3.5–5.1)
Sodium: 128 mmol/L — ABNORMAL LOW (ref 135–145)
Sodium: 128 mmol/L — ABNORMAL LOW (ref 135–145)

## 2019-05-06 LAB — COOXEMETRY PANEL
Carboxyhemoglobin: 1.9 % — ABNORMAL HIGH (ref 0.5–1.5)
Methemoglobin: 0.8 % (ref 0.0–1.5)
O2 Saturation: 72.7 %
Total hemoglobin: 16.6 g/dL — ABNORMAL HIGH (ref 12.0–16.0)

## 2019-05-06 LAB — GLUCOSE, CAPILLARY: Glucose-Capillary: 149 mg/dL — ABNORMAL HIGH (ref 70–99)

## 2019-05-06 MED ORDER — METOLAZONE 5 MG PO TABS: 5.0000 mg | ORAL_TABLET | ORAL | 3 refills | Status: AC

## 2019-05-06 MED ORDER — POTASSIUM CHLORIDE CRYS ER 20 MEQ PO TBCR
60.0000 meq | EXTENDED_RELEASE_TABLET | ORAL | Status: AC
  Administered 2019-05-06: 16:00:00 60 meq via ORAL
  Filled 2019-05-06: qty 3

## 2019-05-06 MED ORDER — LORAZEPAM 0.5 MG PO TABS: 0.5000 mg | ORAL_TABLET | Freq: Three times a day (TID) | ORAL | Status: DC | PRN

## 2019-05-06 MED ORDER — LORAZEPAM 0.5 MG PO TABS: 0.5000 mg | ORAL_TABLET | Freq: Three times a day (TID) | ORAL | 0 refills | Status: AC | PRN

## 2019-05-06 MED ORDER — MORPHINE SULFATE (CONCENTRATE) 10 MG/0.5ML PO SOLN: 5.0000 mg | ORAL | Status: DC | PRN

## 2019-05-06 MED ORDER — POTASSIUM CHLORIDE CRYS ER 20 MEQ PO TBCR: EXTENDED_RELEASE_TABLET | ORAL | 3 refills | Status: AC

## 2019-05-06 MED ORDER — MORPHINE SULFATE (CONCENTRATE) 10 MG/0.5ML PO SOLN: 5.0000 mg | ORAL | 0 refills | Status: AC | PRN

## 2019-05-06 MED ORDER — POTASSIUM CHLORIDE CRYS ER 20 MEQ PO TBCR
40.0000 meq | EXTENDED_RELEASE_TABLET | ORAL | Status: AC
  Administered 2019-05-06 (×2): 40 meq via ORAL
  Filled 2019-05-06: qty 2

## 2019-05-06 MED ORDER — TORSEMIDE 20 MG PO TABS: 60.0000 mg | ORAL_TABLET | Freq: Two times a day (BID) | ORAL | 6 refills | Status: AC

## 2019-05-06 NOTE — Telephone Encounter (Signed)
yes

## 2019-05-06 NOTE — Telephone Encounter (Signed)
Cardiology will be attending.

## 2019-05-06 NOTE — Progress Notes (Signed)
Hydrologist [facility])  Hospital Liaison: RN visit    Notified by Hassan Rowan [facility] of patient/family request for [facility] services at home after discharge. Chart and patient information under review by [facility] physician. Hospice eligibility pending at this time.     Writer spoke with wife, Ivin Booty by phone to initiate education related to hospice philosophy, services and team approach to care.   Ivin Booty  verbalized understanding of information given. Per discussion, plan is for discharge to home by private vehicle.     Please send signed and completed DNR form home with patient/family. Patient will need prescriptions for discharge comfort medications.     DME needs have been discussed, patient currently has the following equipment in the home: walker, shower chair.  Patient/family requests the following DME for delivery to the home: [employer]. [place] equipment manager has been notified and will contact [employer] to arrange delivery to the home. Home address has been verified and is correct in the chart. Ivin Booty is the family member to contact to arrange time of delivery.     [facility] aware of the above. Please notify [facility] when patient is ready to leave the unit at discharge. (Call [phone] or [phone] after 5pm.) [facility] information and contact numbers given to [facility].       Please call with any hospice related questions.     Thank you for this referral.     Farrel Gordon, RN, CCM  [place] (listed on AMION under Hospice and [place] [facility] [place])  [phone]

## 2019-05-06 NOTE — Progress Notes (Signed)
Paged night and day cardiology cover about pts potassium. awaiting for call back, will continue to monitor patient.

## 2019-05-06 NOTE — Discharge Summary (Addendum)
Advanced Heart Failure Team  Discharge Summary   Patient ID: Mason Arnold MRN: [id number], DOB/AGE: November 22, 1941 78 y.o. Admit date: 04/24/2019 D/C date:     05/06/2019   Primary Discharge Diagnoses:   1. Acute on chronic systolic CHF, end-stage: Echo (2/20) with EF 20-25%, severely decreased RV systolic function, severe MR. St Jude CRT-D.  Ischemic cardiomyopathy with significant co-existing RV failure.  This is now complicated as well by cardiorenal syndrome.  - EF 5-10% by TEE 5/27 severe RV dysfunction 3-4+MR 2. Severe Mitral regurgitation:  - as above 3. CAD: s/p CABG. Cath in 2/20 with patent grafts.   - No ASA with Eliquis use.  - Continue simvastatin.   4. Atrial fibrillation: Paroxysmal, he is in NSR.   - Continue Eliquis.  5. 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 Type 2 DM: Jardiance on hold with low GFR.   - Continue SSI.  7. Hypokalemia - supp K aggressively. K+ 3.1 supplemented with 30meq prior to discharge   Hospital Course:   78 y/o with severe systolic HF due to end-stage iCM. Recently admitted with volume overload and improved with IV diuresis. Readmitted with recurrent volume overload and AKI despite increasing diuretics. Initial CVP > 20. Poor response to IV lasix so milrinone added with improve response.  TEE performed which showed severe biventricular HF with LVEF 5-10%, severe RV dysfunction and severe MR. Structural Heart team consulted and case reviewed with Dr. Burt Knack at length. Felt not to be ideal candidate for MitraClip due to advanced HF. Not VAD candidate due to RV failure.   Palliative Care consulted and case discussed with patient and family at length and decided on home hospice. ICD deactivated. Renal function improved with milrinone. Patient diuresed. Hospital course c/b hypokalemia requiring aggressive supplementation.   At time of d/c torsemide increased to 60 bid and metolazone increase to 3x/week. Entresto stopped with  soft BP.   Meds for home:  Torsemide 60 bid Metolazone 5 MWF Kcl 40 bid + 40 extra with metoalzone Spiro 25 dialy Digoxin 0/125 daily Apixaban 5 bid  Stop Entresto  Discussed comfort meds with palliative care team prior to discharge. Recommendations for morphine and ativan for discharge. Paper Rx printed and given to patient prior to discharge.   Discharge Vitals: Blood pressure 110/77, pulse 69, temperature 98 F (36.7 C), temperature source Oral, resp. rate (!) 24, height 5\' 5"  (1.651 m), weight 68.2 kg, SpO2 96 %.  Labs: Lab Results  Component Value Date   WBC 8.7 05/04/2019   HGB 12.8 (L) 05/04/2019   HCT 37.2 (L) 05/04/2019   MCV 90.7 05/04/2019   PLT 144 (L) 05/04/2019    Recent Labs  Lab 05/06/19 1337  NA 128*  K 3.1*  CL 79*  CO2 32  BUN 62*  CREATININE 1.79*  CALCIUM 9.7  GLUCOSE 180*   Lab Results  Component Value Date   CHOL 276 (H) 06/15/2014   HDL 33.00 (L) 06/15/2014   LDLCALC 172 (H) 06/15/2014   TRIG 353.0 (H) 06/15/2014   BNP (last 3 results) Recent Labs    02/02/19 1500 04/22/19 1330 04/24/19 1449  BNP 3,871.3* >4,500.0* 3,264.2*    ProBNP (last 3 results) No results for input(s): PROBNP in the last 8760 hours.   Diagnostic Studies/Procedures   TEE: 05/04/19  IMPRESSIONS    1. The left ventricle had a visually estimated ejection fraction of of 5-10%. The cavity size was moderate to severely dilated. Left ventricular  diffuse hypokinesis.  2. The right ventricle has severely reduced systolic function. There is no increase in right ventricular wall thickness.  3. Left atrial size was severely dilated.  4. Right atrial size was severely dilated.  5. Mitral valve regurgitation 3-4+. The MR jet is posteriorly-directed. No evidence of mitral valve stenosis.  6. Mild restritction of posterior MV leaflet.  7. Tricuspid valve regurgitation is moderate-severe.  8. The aortic valve is tricuspid Aortic valve regurgitation is trivial by  color flow Doppler. No stenosis of the aortic valve.  9. The aortic root and ascending aorta are normal in size and structure. 10. There is evidence of severe plaque in the descending aorta.  FINDINGS  Left Ventricle: The left ventricle has a visually estimated ejection fraction of of 5-10%. The cavity size was moderate to severely dilated. Left ventricular diffuse hypokinesis.  Right Ventricle: The right ventricle has severely reduced systolic function. There is no increase in right ventricular wall thickness.  Left Atrium: Left atrial size was severely dilated.   Right Atrium: Right atrial size was severely dilated. Right atrial pressure is estimated at 10 mmHg. pacer wire.  Interatrial Septum: No atrial level shunt detected by color flow Doppler.  Mitral Valve: The mitral valve is normal in structure. Mitral valve regurgitation 3-4+. The MR jet is posteriorly-directed. No evidence of mitral valve stenosis. Mild restritction of posterior MV leaflet.    Tricuspid Valve: The tricuspid valve was normal in structure. Tricuspid valve regurgitation is moderate-severe by color flow Doppler.  Aortic Valve: The aortic valve is tricuspid Aortic valve regurgitation is trivial by color flow Doppler. There is No stenosis of the aortic valve.  Pulmonic Valve: The pulmonic valve was normal in structure. Pulmonic valve regurgitation is trivial by color flow Doppler.  Aorta: The aortic root and ascending aorta are normal in size and structure. There is evidence of severe plaque in the descending aorta.   Discharge Medications   Allergies as of 05/06/2019      Reactions   Ampicillin Rash   Did it involve swelling of the face/tongue/throat, SOB, or low BP? No Did it involve sudden or severe rash/hives, skin peeling, or any reaction on the inside of your mouth or nose? Yes Did you need to seek medical attention at a hospital or doctor's office? Yes When did it last happen?25+  years If all above answers are "NO", may proceed with cephalosporin use.   Crestor [rosuvastatin Calcium] Rash, Other (See Comments)   Rash as nodules ("like strawberries")   Orange Fruit [citrus] Swelling, Other (See Comments)   Lips swelling, severe headache (including lemons and other citrus fruits)   Propoxyphene N-acetaminophen Other (See Comments)   hallucinations  [darvicet]   Atorvastatin Other (See Comments)   Leg cramps      Medication List    STOP taking these medications   empagliflozin 10 MG Tabs tablet Commonly known as:  JARDIANCE   furosemide 10 MG/ML solution Commonly known as:  LASIX   sacubitril-valsartan 24-26 MG Commonly known as:  ENTRESTO     TAKE these medications   albuterol 108 (90 Base) MCG/ACT inhaler Commonly known as:  VENTOLIN HFA Inhale 2 puffs into the lungs every 4 (four) hours as needed for wheezing.   allopurinol 100 MG tablet Commonly known as:  ZYLOPRIM Take 1 tablet (100 mg total) by mouth daily at 8 pm. (1900) What changed:    when to take this  additional instructions   calcium carbonate 600 MG Tabs tablet Commonly  known as:  OS-CAL Take 600 mg by mouth daily after breakfast.   colchicine 0.6 MG tablet Take 0.6 mg by mouth daily as needed (gout flare up).   digoxin 0.125 MG tablet Commonly known as:  LANOXIN Take 1 tablet (125 mcg total) by mouth daily.   Eliquis 5 MG Tabs tablet Generic drug:  apixaban TAKE 1 TABLET BY MOUTH TWICE A DAY What changed:  how much to take   famotidine 10 MG tablet Commonly known as:  PEPCID Take 10 mg by mouth daily before supper. 2 HRS BEFORE SUPPER.   Fish Oil 1000 MG Caps Take 1,000 mg by mouth daily after breakfast.   FLUoxetine 20 MG capsule Commonly known as:  PROZAC Take 1 capsule (20 mg total) by mouth daily.   insulin glargine 100 UNIT/ML injection Commonly known as:  LANTUS Inject 10-15 Units into the skin at bedtime as needed (after eating a large meal). (2100)    loratadine 10 MG tablet Commonly known as:  CLARITIN Take 10 mg by mouth daily as needed for allergies.   LORazepam 0.5 MG tablet Commonly known as:  ATIVAN Take 1 tablet (0.5 mg total) by mouth every 8 (eight) hours as needed for anxiety or sleep.   metolazone 5 MG tablet Commonly known as:  ZAROXOLYN Take 1 tablet (5 mg total) by mouth every Monday, Wednesday, and Friday. What changed:  when to take this   morphine CONCENTRATE 10 MG/0.5ML Soln concentrated solution Take 0.25-0.5 mLs (5-10 mg total) by mouth every 2 (two) hours as needed for moderate pain or shortness of breath.   multivitamin tablet Take 1 tablet by mouth daily after breakfast.   potassium chloride SA 20 MEQ tablet Commonly known as:  K-DUR Take 2 tablets (40 mEq total) by mouth 2 (two) times daily AND 2 tablets (40 mEq total) every Monday, Wednesday, and Friday. Take 40 MEQ Mondays Wednesdays and Fridays WITH  METOLAZONE DOSE. What changed:  See the new instructions.   spironolactone 25 MG tablet Commonly known as:  ALDACTONE Take 25 mg by mouth every evening.   Theratears 0.25 % Soln Generic drug:  Carboxymethylcellulose Sodium Place 1 drop into both eyes 3 (three) times daily as needed (for dry eyes).   torsemide 20 MG tablet Commonly known as:  DEMADEX Take 3 tablets (60 mg total) by mouth 2 (two) times daily. What changed:  how much to take       Disposition   The patient will be discharged in stable condition to home.  Follow-up Information    [facility] Follow up.   Specialty:  PALLIATIVE CARE Why:  Registered Nurse for Hospice Services Contact information: [place]            Duration of Discharge Encounter: Greater than 35 minutes   Signed, Glori Bickers, MD  4:10 PM

## 2019-05-06 NOTE — Progress Notes (Signed)
Patient ID: Mason Arnold, male   DOB: May 24, 1941, 78 y.o.   MRN: [id number]     Advanced Heart Failure Rounding Note  PCP-Cardiologist: No primary care provider on file.   Subjective:    He feels better this am. Remains on milrinone and lasix gtt. CVP down to 12. Denies SOB, orthopnea or PND. Co-ox 73%  Objective:   Weight Range: 68.2 kg Body mass index is 25.02 kg/m.   Vital Signs:   Temp:  [97.6 F (36.4 C)-98.4 F (36.9 C)] 97.8 F (36.6 C) (05/29 0428) Pulse Rate:  [64-69] 69 (05/29 1124) Resp:  [13-28] 24 (05/29 1124) BP: (90-110)/(61-77) 110/77 (05/29 1124) SpO2:  [93 %-99 %] 96 % (05/29 1124) Weight:  [68.2 kg-68.5 kg] 68.2 kg (05/29 0458) Last BM Date: 05/05/19  Weight change: Filed Weights   05/04/19 0656 05/06/19 0414 05/06/19 0458  Weight: 66.7 kg 68.5 kg 68.2 kg    Intake/Output:   Intake/Output Summary (Last 24 hours) at 05/06/2019 1144 Last data filed at 05/06/2019 0900 Gross per 24 hour  Intake 472.3 ml  Output 1150 ml  Net -677.7 ml      Physical Exam    General:  Lying in bed  No resp difficulty HEENT: normal Neck: supple. JVP to jaw. Carotids 2+ bilat; no bruits. No lymphadenopathy or thryomegaly appreciated. Cor: PMI nondisplaced. Regular rate & rhythm. 2/6 TR/MR Lungs: clear Abdomen: soft, nontender, nondistended. No hepatosplenomegaly. No bruits or masses. Good bowel sounds. Extremities: no cyanosis, clubbing, rash, edema Neuro: alert & orientedx3, cranial nerves grossly intact. moves all 4 extremities w/o difficulty. Affect pleasant   Telemetry   NSR with BiV pacing 60-70s with PVCs (personally reviewed)  Labs    CBC Recent Labs    05/04/19 0740 05/04/19 0949  WBC  --  8.7  NEUTROABS  --  6.7  HGB 13.3 12.8*  HCT 39.0 37.2*  MCV  --  90.7  PLT  --  403*   Basic Metabolic Panel Recent Labs    05/04/19 0740  05/05/19 0500 05/06/19 0427  NA 131*  130*   < > 132* 128*  K 2.6*  2.7*   < > 3.7 2.7*  CL 77*   < > 84*  81*  CO2 37*   < > 32 31  GLUCOSE 124*  119*   < > 147* 169*  BUN 64*   < > 71* 62*  CREATININE 1.53*   < > 1.86* 1.72*  CALCIUM 9.8   < > 9.4 9.5  MG 2.2  --   --   --    < > = values in this interval not displayed.   Liver Function Tests No results for input(s): AST, ALT, ALKPHOS, BILITOT, PROT, ALBUMIN in the last 72 hours. No results for input(s): LIPASE, AMYLASE in the last 72 hours. Cardiac Enzymes No results for input(s): CKTOTAL, CKMB, CKMBINDEX, TROPONINI in the last 72 hours.  BNP: BNP (last 3 results) Recent Labs    02/02/19 1500 04/22/19 1330 04/24/19 1449  BNP 3,871.3* >4,500.0* 3,264.2*    ProBNP (last 3 results) No results for input(s): PROBNP in the last 8760 hours.   D-Dimer No results for input(s): DDIMER in the last 72 hours. Hemoglobin A1C No results for input(s): HGBA1C in the last 72 hours. Fasting Lipid Panel No results for input(s): CHOL, HDL, LDLCALC, TRIG, CHOLHDL, LDLDIRECT in the last 72 hours. Thyroid Function Tests No results for input(s): TSH, T4TOTAL, T3FREE, THYROIDAB in the last 72 hours.  Invalid  input(s): FREET3  Other results:   Imaging    No results found.   Medications:     Scheduled Medications: . acetaZOLAMIDE  500 mg Oral Q12H  . allopurinol  100 mg Oral QPM  . apixaban  5 mg Oral BID  . digoxin  125 mcg Oral Daily  . famotidine  10 mg Oral QAC supper  . FLUoxetine  20 mg Oral Daily  . insulin aspart  0-9 Units Subcutaneous TID WC  . insulin glargine  5 Units Subcutaneous QHS  . metolazone  5 mg Oral Daily  . potassium chloride  40 mEq Oral BID  . simvastatin  20 mg Oral q1800  . sodium chloride flush  10-40 mL Intracatheter Q12H  . spironolactone  25 mg Oral QPM    Infusions: . furosemide (LASIX) infusion 20 mg/hr (05/06/19 0333)  . milrinone 0.25 mcg/kg/min (05/06/19 0304)    PRN Medications: acetaminophen, albuterol, loperamide, loratadine, menthol-cetylpyridinium, nitroGLYCERIN, ondansetron  (ZOFRAN) IV, polyvinyl alcohol, sodium chloride flush   Assessment/Plan   1. Acute on chronic systolic CHF: Echo (3/81) with EF 20-25%, severely decreased RV systolic function, severe MR. St Jude CRT-D.  Ischemic cardiomyopathy with significant co-existing RV failure.  This is now complicated as well by cardiorenal syndrome.  - EF 5-10% by TEE 5/27 severe RV dysfunction 3-4+MR - Now on milrinone 0.25 Co-ox stable at 73% - CVP down to 12 - After reviewing TEE with structural heart team, we all believe that he likely has end-stage biventricular HF and has had little benefit from milrinone and would likely not benefit from MitraClip at this point - Long discussion with family at bedside and Palliative care team yesterday - We have decided about switch to [place].  - ICD has been deactivated - Plan home with Hospice today after K supped   Meds for home:  Torsemide 60 bid Metolazone 5 MWF Kcl 40 bid + 40 extra with metoalzone Spiro 25 dialy Digoxin 0/125 daily Apixaban 5 bid  Stop Entresto  2. Mitral regurgitation:  - as above 3. CAD: s/p CABG. Cath in 2/20 with patent grafts.   - No ASA with Eliquis use.  - Continue simvastatin.   4. Atrial fibrillation: Paroxysmal, he is in NSR.   - Continue Eliquis.  5. 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 Type 2 DM: Jardiance on hold with low GFR.   - Continue SSI.  7. H/o recurrent syncope: none recently.  8. Hypokalemia - supp K aggressively.    Length of Stay: 12  Glori Bickers, MD  05/06/2019, 11:44 AM  Advanced Heart Failure Team Pager [phone] (M-F; 7a - 4p)  Please contact [facility] for night-coverage after hours (4p -7a ) and weekends on [email or web address]

## 2019-05-06 NOTE — Progress Notes (Signed)
Physical Therapy Treatment Patient Details Name: Mason Arnold MRN: [id number] DOB: 03-27-1941 Today's Date: 05/06/2019    History of Present Illness 78 y.o. male admitted with edema, volume overload and CHf exacerbation. PMHx: CAD s/p CABG in 2000, ischemic cardiomyopathy with EF of 20% with St. Jude CRT-D, paroxysmal atrial fibrillation on Eliquis, gout, hypertension, hyperlipidemia, and DM    PT Comments    Patient required increased assist for mobility today but reported being fatigued at baseline. Patients family came to see the patient and they were made aware of the decrease in mobility today. They are aware that he has been getting weaker. They asked what type of transportation may be the best home. They are concerned about him being able to walk to the house. Judging by his mobility today this is a legitimate concern and medical transprotation may be thir best option. Physical therapy will continue to follow the patient.    Follow Up Recommendations  Home health PT     Equipment Recommendations  None recommended by PT    Recommendations for Other Services       Precautions / Restrictions Precautions Precautions: Fall Restrictions Weight Bearing Restrictions: No    Mobility  Bed Mobility Overal bed mobility: Needs Assistance Bed Mobility: Supine to Sit     Supine to sit: Mod assist     General bed mobility comments: Mod a to scoot hip to the edge of the bed. Once sitting the patient required intemittent min a for balance. He sat at the edge of the bed. for 7 minutes while taking pills. He then stood with therapy and it was noted that he had a BM and his condom cath fell off. He had to sit back down and wait for nursing assistance for another 10 minutes while legs were cleaned and while ating for assist. . At this point he reported beding fvery fatigued and transfered back to bed with mod a.   Transfers Overall transfer level: Needs assistance Equipment used: Rolling  walker (2 wheeled) Transfers: Sit to/from [employer] Sit to Stand: Mod assist         General transfer comment: Mod a for strength to stand. Once standing min a for balance. BM noted and cath fell off. Patient sat back down and NT called for assistance/   Ambulation/Gait                 Stairs             Wheelchair Mobility    Modified Rankin (Stroke Patients Only)       Balance Overall balance assessment: Needs assistance Sitting-balance support: Feet supported;No upper extremity supported Sitting balance-Leahy Scale: Fair     Standing balance support: During functional activity;Bilateral upper extremity supported Standing balance-Leahy Scale: Poor Standing balance comment: requires UE support                            Cognition Arousal/Alertness: Lethargic Behavior During Therapy: WFL for tasks assessed/performed Overall Cognitive Status: Within Functional Limits for tasks assessed                                        Exercises Other Exercises Other Exercises: sit to stand x 5 in 25 seconds with reliance on hands on armrest    General Comments General comments (skin integrity, edema, etc.): 5L of  NCo2       Pertinent Vitals/Pain Pain Assessment: No/denies pain    Home Living                      Prior Function            PT Goals (current goals can now be found in the care plan section) Acute Rehab PT Goals Patient Stated Goal: return home and be more active PT Goal Formulation: With patient Time For Goal Achievement: 05/09/19 Potential to Achieve Goals: Good Progress towards PT goals: Not progressing toward goals - comment    Frequency    Min 3X/week      PT Plan Current plan remains appropriate    Co-evaluation              AM-PAC PT "6 Clicks" Mobility   Outcome Measure  Help needed turning from your back to your side while in a flat bed without using  bedrails?: A Little Help needed moving from lying on your back to sitting on the side of a flat bed without using bedrails?: A Little Help needed moving to and from a bed to a chair (including a wheelchair)?: A Little Help needed standing up from a chair using your arms (e.g., wheelchair or bedside chair)?: A Little Help needed to walk in hospital room?: A Lot Help needed climbing 3-5 steps with a railing? : A Lot 6 Click Score: 16    End of Session Equipment Utilized During Treatment: Gait belt;Oxygen Activity Tolerance: Patient tolerated treatment well Patient left: in bed;with nursing/sitter in room;with call bell/phone within reach Nurse Communication: Mobility status PT Visit Diagnosis: Other abnormalities of gait and mobility (R26.89)     Time: 2951-8841 PT Time Calculation (min) (ACUTE ONLY): 13 min  Charges:  $Therapeutic Activity: 23-37 mins                        Carney Living PT DPT  05/06/2019, 2:39 PM

## 2019-05-06 NOTE — Progress Notes (Signed)
CRITICAL VALUE ALERT Potassium  Critical Value: 2.7  Date & Time Notied:  05/06/2019  0530  Provider Notified: Cardiology -Johnsie Cancel   Orders Received/Actions taken: awaiting for orders, will continue to monitor patient.

## 2019-05-06 NOTE — Telephone Encounter (Signed)
Ok being attending physician?

## 2019-05-06 NOTE — Telephone Encounter (Signed)
Copied from [place] [phone]. Topic: General - Inquiry >> May 06, 2019 10:06 AM Yvette Rack wrote: Reason for CRM: Delsa Sale with [facility] stated patient will be discharged today and she would like to know if Dr. Quay Burow will be attending record. Cb# [phone]

## 2019-05-09 LAB — CULTURE, BLOOD (ROUTINE X 2)
Culture: NO GROWTH
Culture: NO GROWTH

## 2019-05-10 ENCOUNTER — Encounter (HOSPITAL_COMMUNITY): Payer: Self-pay

## 2019-05-16 ENCOUNTER — Ambulatory Visit (INDEPENDENT_AMBULATORY_CARE_PROVIDER_SITE_OTHER): Payer: Medicare Other

## 2019-05-16 DIAGNOSIS — Z95810 Presence of automatic (implantable) cardiac defibrillator: Secondary | ICD-10-CM

## 2019-05-16 DIAGNOSIS — I5022 Chronic systolic (congestive) heart failure: Secondary | ICD-10-CM

## 2019-05-18 ENCOUNTER — Telehealth (HOSPITAL_COMMUNITY): Payer: Self-pay

## 2019-05-18 NOTE — Telephone Encounter (Signed)
Received call from palliative RN. Advised that Mason Arnold is actively dying and will be withdrawn from all medications except comfort medications. Mason Arnold is unresponsive at this time. Wanted to make Dr. Haroldine Laws aware. MD aware

## 2019-05-20 NOTE — Progress Notes (Signed)
EPIC Encounter for ICM Monitoring  Patient Name: Mason Arnold is a 78 y.o. male Date: 05/29/2019 Primary Care Physican: Binnie Rail, MD Primary Cardiologist:[facility] Electrophysiologist: Caryl Comes Nephrologist: [facility] Bi-[facility] Pacing: 98%    Transmission reviewed.   Thoracic impedancetrending above baseline which correlates with being diuresed recently at hospital visit.   Prescribed:Furosemide20 mg take 2 tablets (40 mg total) twice a day.Metolazone 2.5 mg 1 tablet on Wednesday and Sunday. Potassium 20 mEq take 2 tablets (40 mEq total) every morning and 1 tablet (20 mEq total) every evening. Take 40 mEq extra with Metolazone.  Labs: 02/10/2019 Creatinine1.06, BUN27, Potassium3.2, Sodium131, GFR>60 02/06/2019 Creatinine1.36, BUN32, Potassium3.2, [id number], FTD32-20  02/05/2019 Creatinine1.25, BUN26, Potassium3.2, Sodium135, GFR54->60  02/04/2019 Creatinine1.40, BUN30, Potassium3.2, [id number], [id number]  02/03/2019 Creatinine1.32, BUN32, Potassium3.2, Sodium133, [id number] 01/26/2019 Creatinine1.35, BUN27, Potassium4.8, Sodium133, GFR50-57 12/26/2019Creatinine 1.77, BUN30, Potassium4.9, Sodium135, VVO16-07 A complete set of results can be found in Results Review.  Recommendations:No changes  Follow-up plan: No further ICM clinic monthly follow up due to patient is in hospice care.  Copy of ICM check sent to [place].  3 month ICM trend: 05/14/2019  Direct Trend Viewer    Rosalene Billings, RN 05/18/2019 2:39 PM

## 2019-05-25 ENCOUNTER — Telehealth (HOSPITAL_COMMUNITY): Payer: Self-pay

## 2019-05-25 NOTE — Telephone Encounter (Signed)
Death certificate faxed to [employer] and [employer].  They will pick up original today.

## 2019-05-31 ENCOUNTER — Encounter: Payer: Self-pay | Admitting: Thoracic Surgery (Cardiothoracic Vascular Surgery)

## 2019-06-06 ENCOUNTER — Telehealth (HOSPITAL_COMMUNITY): Payer: Self-pay

## 2019-06-06 NOTE — Telephone Encounter (Signed)
Signed orders faxed back to hospice at [facility] 06/02/2019

## 2019-06-08 DEATH — deceased

## 2019-06-24 ENCOUNTER — Telehealth (HOSPITAL_COMMUNITY): Payer: Self-pay

## 2019-06-24 NOTE — Telephone Encounter (Signed)
Physician orders POC signed and faxed to [facility] care. Confirmation received.

## 2019-08-03 IMAGING — CR CHEST - 2 VIEW
2 series · 2 of 2 positions shown · non-contrast
Comparison: 02/05/2019

CLINICAL DATA: Heart failure.

EXAM:
CHEST - 2 VIEW

[chest pa]
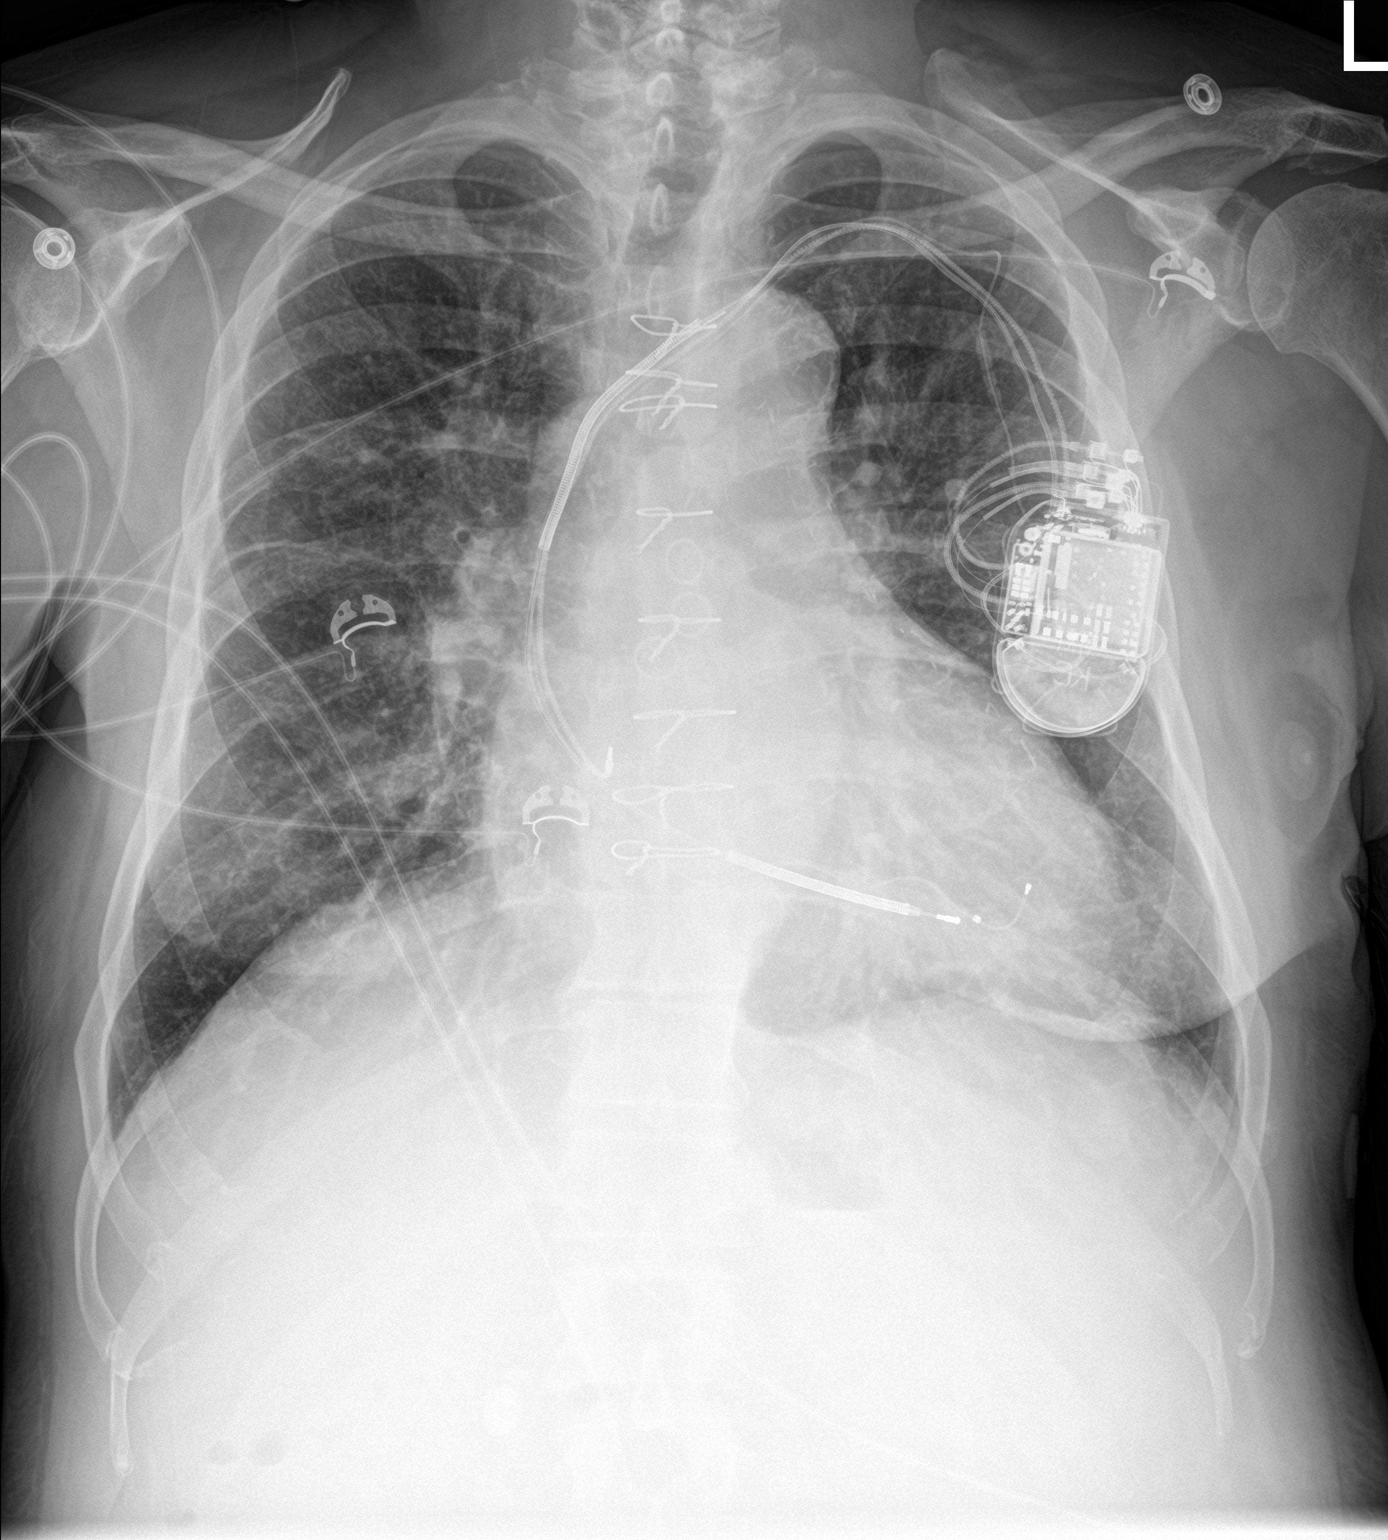

[chest lat]
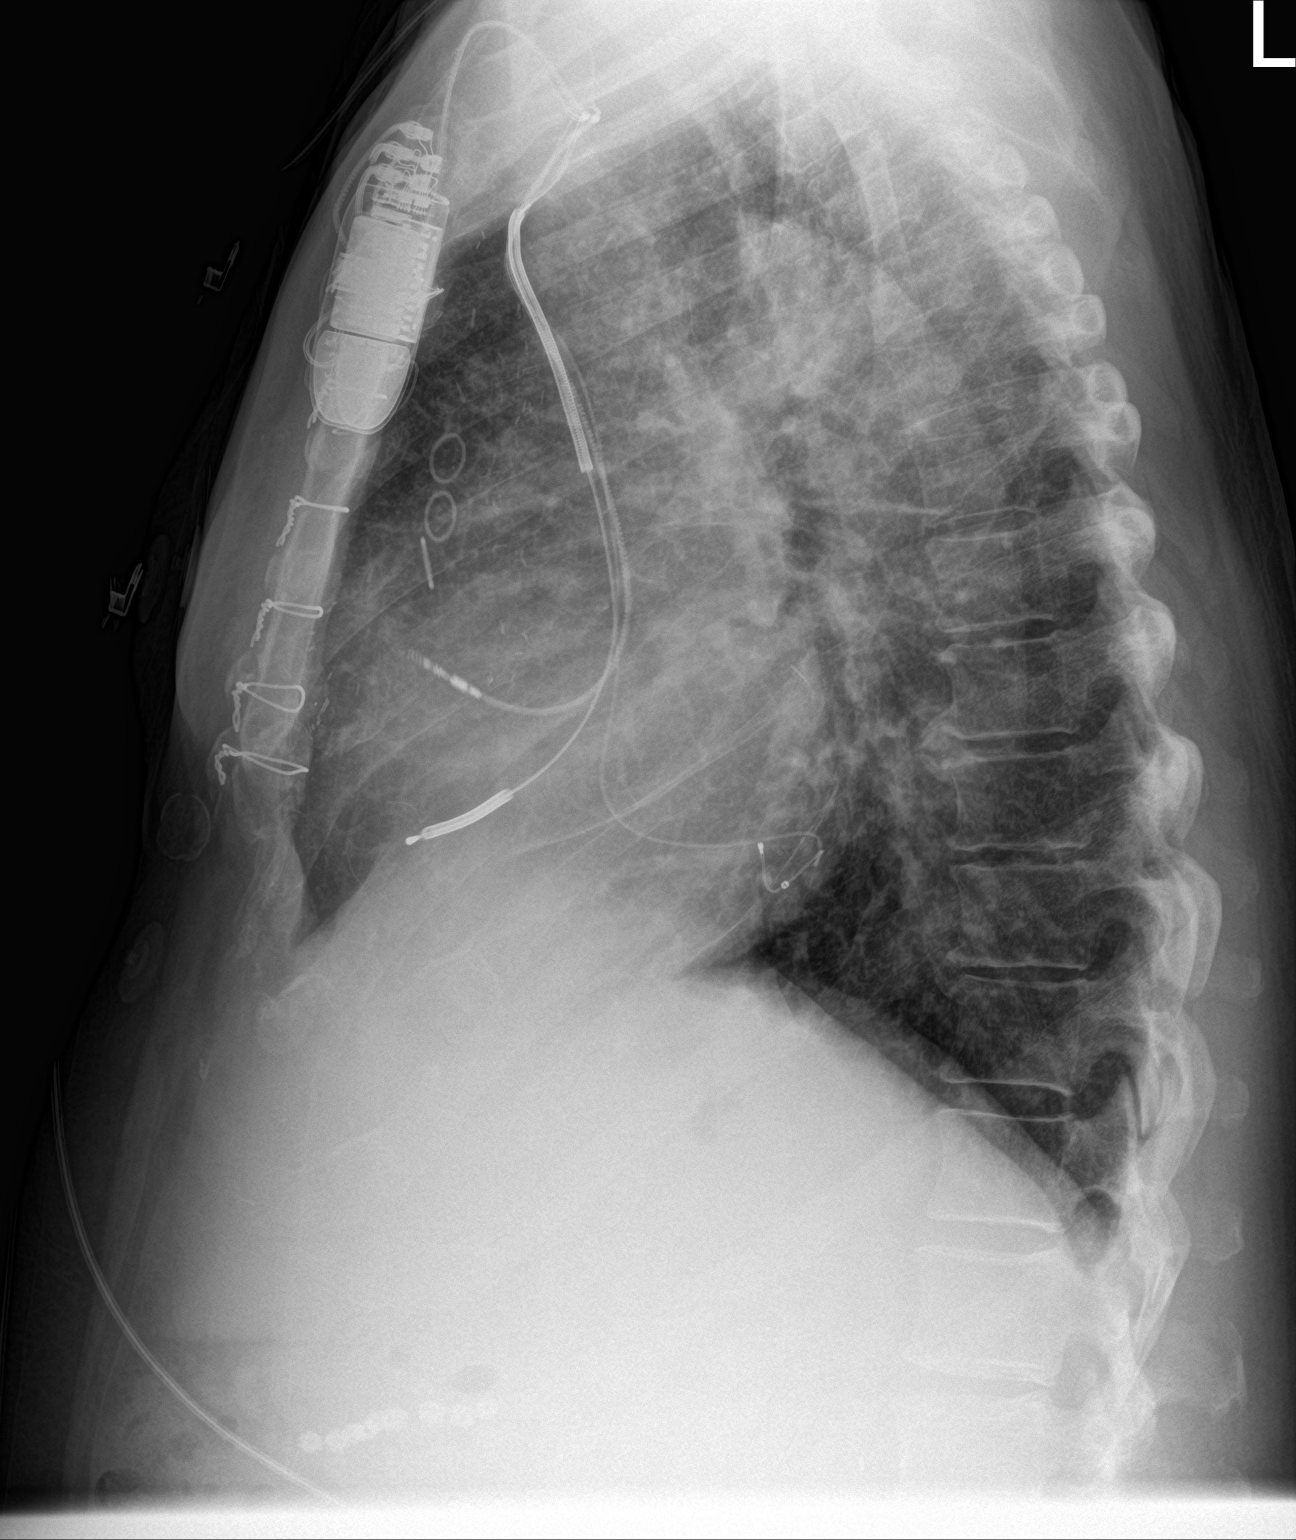

[2 of 2 positions shown; findings below may reference images not displayed]

FINDINGS: Left chest wall ICD is noted with leads in the right atrial
appendage and right ventricle. The heart size appears mildly
enlarged. Aortic atherosclerosis. Previous median sternotomy and
CABG procedure. No pleural effusion identified. Pulmonary vascular
congestion without edema. No airspace consolidation.
IMPRESSION: 1. Cardiac enlargement and pulmonary vascular congestion.
2.  Aortic Atherosclerosis ([id number]).

## 2020-06-05 ENCOUNTER — Other Ambulatory Visit: Payer: Self-pay | Admitting: Internal Medicine
